# Patient Record
Sex: Female | Born: 1950 | Race: White | Hispanic: No
Health system: Southern US, Community
[De-identification: ages and names within clinical notes are randomized; demographics above are authoritative.]

## PROBLEM LIST (undated history)

## (undated) DIAGNOSIS — I82409 Acute embolism and thrombosis of unspecified deep veins of unspecified lower extremity: Secondary | ICD-10-CM

## (undated) DIAGNOSIS — J45909 Unspecified asthma, uncomplicated: Secondary | ICD-10-CM

## (undated) DIAGNOSIS — G8929 Other chronic pain: Secondary | ICD-10-CM

## (undated) DIAGNOSIS — I10 Essential (primary) hypertension: Secondary | ICD-10-CM

## (undated) DIAGNOSIS — O2230 Deep phlebothrombosis in pregnancy, unspecified trimester: Secondary | ICD-10-CM

## (undated) DIAGNOSIS — S32010A Wedge compression fracture of first lumbar vertebra, initial encounter for closed fracture: Secondary | ICD-10-CM

## (undated) DIAGNOSIS — R159 Full incontinence of feces: Secondary | ICD-10-CM

## (undated) DIAGNOSIS — N390 Urinary tract infection, site not specified: Secondary | ICD-10-CM

## (undated) DIAGNOSIS — K921 Melena: Secondary | ICD-10-CM

## (undated) DIAGNOSIS — J439 Emphysema, unspecified: Secondary | ICD-10-CM

## (undated) DIAGNOSIS — T7840XA Allergy, unspecified, initial encounter: Secondary | ICD-10-CM

## (undated) DIAGNOSIS — K219 Gastro-esophageal reflux disease without esophagitis: Secondary | ICD-10-CM

## (undated) DIAGNOSIS — J449 Chronic obstructive pulmonary disease, unspecified: Secondary | ICD-10-CM

## (undated) DIAGNOSIS — F32A Depression, unspecified: Secondary | ICD-10-CM

## (undated) DIAGNOSIS — N183 Chronic kidney disease, stage 3 unspecified: Secondary | ICD-10-CM

## (undated) DIAGNOSIS — I519 Heart disease, unspecified: Secondary | ICD-10-CM

## (undated) DIAGNOSIS — I219 Acute myocardial infarction, unspecified: Secondary | ICD-10-CM

## (undated) DIAGNOSIS — Z9289 Personal history of other medical treatment: Secondary | ICD-10-CM

## (undated) DIAGNOSIS — E785 Hyperlipidemia, unspecified: Secondary | ICD-10-CM

## (undated) DIAGNOSIS — R32 Unspecified urinary incontinence: Secondary | ICD-10-CM

## (undated) DIAGNOSIS — K635 Polyp of colon: Secondary | ICD-10-CM

## (undated) DIAGNOSIS — B019 Varicella without complication: Secondary | ICD-10-CM

## (undated) DIAGNOSIS — R519 Headache, unspecified: Secondary | ICD-10-CM

## (undated) DIAGNOSIS — F4310 Post-traumatic stress disorder, unspecified: Secondary | ICD-10-CM

## (undated) DIAGNOSIS — Z86718 Personal history of other venous thrombosis and embolism: Secondary | ICD-10-CM

## (undated) DIAGNOSIS — K5792 Diverticulitis of intestine, part unspecified, without perforation or abscess without bleeding: Secondary | ICD-10-CM

## (undated) HISTORY — DX: Diverticulitis of intestine, part unspecified, without perforation or abscess without bleeding: K57.92

## (undated) HISTORY — DX: Varicella without complication: B01.9

## (undated) HISTORY — DX: Depression, unspecified: F32.A

## (undated) HISTORY — DX: Allergy, unspecified, initial encounter: T78.40XA

## (undated) HISTORY — PX: ENDOMETRIAL ABLATION: SHX621

## (undated) HISTORY — DX: Melena: K92.1

## (undated) HISTORY — DX: Gastro-esophageal reflux disease without esophagitis: K21.9

## (undated) HISTORY — DX: Headache, unspecified: R51.9

## (undated) HISTORY — DX: Full incontinence of feces: R15.9

## (undated) HISTORY — DX: Urinary tract infection, site not specified: N39.0

## (undated) HISTORY — PX: CATARACT EXTRACTION: SUR2

## (undated) HISTORY — DX: Acute myocardial infarction, unspecified: I21.9

## (undated) HISTORY — DX: Personal history of other medical treatment: Z92.89

## (undated) HISTORY — DX: Hyperlipidemia, unspecified: E78.5

## (undated) HISTORY — DX: Heart disease, unspecified: I51.9

## (undated) HISTORY — PX: HYSTEROSCOPY WITH D & C: SHX1775

## (undated) HISTORY — DX: Polyp of colon: K63.5

## (undated) HISTORY — DX: Chronic obstructive pulmonary disease, unspecified: J44.9

## (undated) HISTORY — PX: APPENDECTOMY: SHX54

## (undated) HISTORY — PX: VEIN SURGERY: SHX48

## (undated) HISTORY — DX: Post-traumatic stress disorder, unspecified: F43.10

## (undated) HISTORY — DX: Unspecified urinary incontinence: R32

## (undated) HISTORY — DX: Wedge compression fracture of first lumbar vertebra, initial encounter for closed fracture: S32.010A

## (undated) HISTORY — DX: Emphysema, unspecified: J43.9

## (undated) HISTORY — PX: LAPAROSCOPIC LYSIS OF ADHESIONS: SHX5905

## (undated) HISTORY — PX: COLOSTOMY: SHX63

## (undated) HISTORY — DX: Personal history of other venous thrombosis and embolism: Z86.718

## (undated) HISTORY — PX: HERNIA REPAIR: SHX51

## (undated) HISTORY — PX: EYE SURGERY: SHX253

---
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
## 1983-08-27 HISTORY — PX: HERNIA REPAIR: SHX51

## 1993-08-26 HISTORY — PX: ABDOMINAL HYSTERECTOMY: SHX81

## 2001-01-07 ENCOUNTER — Inpatient Hospital Stay (HOSPITAL_COMMUNITY): Admission: EM | Admit: 2001-01-07 | Discharge: 2001-01-10 | Payer: Self-pay | Admitting: *Deleted

## 2001-01-13 ENCOUNTER — Other Ambulatory Visit (HOSPITAL_COMMUNITY): Admission: RE | Admit: 2001-01-13 | Discharge: 2001-02-02 | Payer: Self-pay | Admitting: Psychiatry

## 2012-12-30 DIAGNOSIS — Z86718 Personal history of other venous thrombosis and embolism: Secondary | ICD-10-CM | POA: Insufficient documentation

## 2012-12-30 DIAGNOSIS — I82409 Acute embolism and thrombosis of unspecified deep veins of unspecified lower extremity: Secondary | ICD-10-CM | POA: Insufficient documentation

## 2012-12-30 DIAGNOSIS — J4540 Moderate persistent asthma, uncomplicated: Secondary | ICD-10-CM | POA: Insufficient documentation

## 2013-02-18 DIAGNOSIS — D6869 Other thrombophilia: Secondary | ICD-10-CM | POA: Insufficient documentation

## 2013-02-18 DIAGNOSIS — Z7901 Long term (current) use of anticoagulants: Secondary | ICD-10-CM | POA: Insufficient documentation

## 2013-03-09 DIAGNOSIS — Z86010 Personal history of colonic polyps: Secondary | ICD-10-CM | POA: Insufficient documentation

## 2013-03-09 DIAGNOSIS — D509 Iron deficiency anemia, unspecified: Secondary | ICD-10-CM | POA: Insufficient documentation

## 2013-07-26 DIAGNOSIS — F39 Unspecified mood [affective] disorder: Secondary | ICD-10-CM | POA: Insufficient documentation

## 2013-08-05 DIAGNOSIS — G4733 Obstructive sleep apnea (adult) (pediatric): Secondary | ICD-10-CM | POA: Insufficient documentation

## 2014-01-27 DIAGNOSIS — K439 Ventral hernia without obstruction or gangrene: Secondary | ICD-10-CM | POA: Insufficient documentation

## 2014-01-27 HISTORY — DX: Ventral hernia without obstruction or gangrene: K43.9

## 2014-01-28 DIAGNOSIS — N329 Bladder disorder, unspecified: Secondary | ICD-10-CM | POA: Insufficient documentation

## 2014-08-26 HISTORY — PX: CHOLECYSTECTOMY: SHX55

## 2015-03-13 DIAGNOSIS — R609 Edema, unspecified: Secondary | ICD-10-CM | POA: Insufficient documentation

## 2015-03-13 DIAGNOSIS — G894 Chronic pain syndrome: Secondary | ICD-10-CM | POA: Diagnosis present

## 2016-03-06 DIAGNOSIS — R791 Abnormal coagulation profile: Secondary | ICD-10-CM | POA: Insufficient documentation

## 2016-07-08 DIAGNOSIS — L298 Other pruritus: Secondary | ICD-10-CM | POA: Insufficient documentation

## 2016-12-11 DIAGNOSIS — R262 Difficulty in walking, not elsewhere classified: Secondary | ICD-10-CM | POA: Insufficient documentation

## 2016-12-11 DIAGNOSIS — Z6841 Body Mass Index (BMI) 40.0 and over, adult: Secondary | ICD-10-CM | POA: Insufficient documentation

## 2016-12-11 DIAGNOSIS — R159 Full incontinence of feces: Secondary | ICD-10-CM

## 2016-12-11 DIAGNOSIS — G4734 Idiopathic sleep related nonobstructive alveolar hypoventilation: Secondary | ICD-10-CM | POA: Insufficient documentation

## 2016-12-11 DIAGNOSIS — Z9989 Dependence on other enabling machines and devices: Secondary | ICD-10-CM | POA: Insufficient documentation

## 2016-12-11 HISTORY — DX: Full incontinence of feces: R15.9

## 2017-03-19 DIAGNOSIS — E538 Deficiency of other specified B group vitamins: Secondary | ICD-10-CM | POA: Insufficient documentation

## 2017-03-19 DIAGNOSIS — N183 Chronic kidney disease, stage 3 unspecified: Secondary | ICD-10-CM | POA: Diagnosis present

## 2017-03-19 DIAGNOSIS — E559 Vitamin D deficiency, unspecified: Secondary | ICD-10-CM | POA: Insufficient documentation

## 2017-03-25 DIAGNOSIS — F330 Major depressive disorder, recurrent, mild: Secondary | ICD-10-CM | POA: Insufficient documentation

## 2017-04-11 DIAGNOSIS — R7303 Prediabetes: Secondary | ICD-10-CM | POA: Insufficient documentation

## 2018-01-12 DIAGNOSIS — J189 Pneumonia, unspecified organism: Secondary | ICD-10-CM

## 2018-01-12 DIAGNOSIS — N3000 Acute cystitis without hematuria: Secondary | ICD-10-CM | POA: Insufficient documentation

## 2018-01-12 DIAGNOSIS — N179 Acute kidney failure, unspecified: Secondary | ICD-10-CM | POA: Insufficient documentation

## 2018-01-12 HISTORY — DX: Pneumonia, unspecified organism: J18.9

## 2018-01-22 DIAGNOSIS — R531 Weakness: Secondary | ICD-10-CM | POA: Insufficient documentation

## 2018-01-22 DIAGNOSIS — R5381 Other malaise: Secondary | ICD-10-CM | POA: Insufficient documentation

## 2018-01-24 DIAGNOSIS — J9621 Acute and chronic respiratory failure with hypoxia: Secondary | ICD-10-CM

## 2018-01-24 HISTORY — DX: Acute and chronic respiratory failure with hypoxia: J96.21

## 2018-08-26 DIAGNOSIS — U071 COVID-19: Secondary | ICD-10-CM

## 2018-08-26 HISTORY — DX: COVID-19: U07.1

## 2018-09-29 ENCOUNTER — Observation Stay (HOSPITAL_BASED_OUTPATIENT_CLINIC_OR_DEPARTMENT_OTHER)
Admission: EM | Admit: 2018-09-29 | Discharge: 2018-10-01 | Disposition: A | Discharge status: Home / Self Care | Payer: Medicare Other | Attending: Emergency Medicine | Admitting: Emergency Medicine

## 2018-09-29 ENCOUNTER — Encounter (HOSPITAL_BASED_OUTPATIENT_CLINIC_OR_DEPARTMENT_OTHER): Payer: Self-pay

## 2018-09-29 ENCOUNTER — Emergency Department (HOSPITAL_BASED_OUTPATIENT_CLINIC_OR_DEPARTMENT_OTHER): Payer: Medicare Other

## 2018-09-29 ENCOUNTER — Other Ambulatory Visit: Payer: Self-pay

## 2018-09-29 DIAGNOSIS — M79606 Pain in leg, unspecified: Secondary | ICD-10-CM

## 2018-09-29 DIAGNOSIS — I10 Essential (primary) hypertension: Secondary | ICD-10-CM | POA: Diagnosis not present

## 2018-09-29 DIAGNOSIS — Z91120 Patient's intentional underdosing of medication regimen due to financial hardship: Secondary | ICD-10-CM | POA: Diagnosis not present

## 2018-09-29 DIAGNOSIS — I82591 Chronic embolism and thrombosis of other specified deep vein of right lower extremity: Secondary | ICD-10-CM | POA: Insufficient documentation

## 2018-09-29 DIAGNOSIS — F329 Major depressive disorder, single episode, unspecified: Secondary | ICD-10-CM | POA: Insufficient documentation

## 2018-09-29 DIAGNOSIS — M79604 Pain in right leg: Secondary | ICD-10-CM | POA: Diagnosis present

## 2018-09-29 DIAGNOSIS — J45909 Unspecified asthma, uncomplicated: Secondary | ICD-10-CM | POA: Insufficient documentation

## 2018-09-29 DIAGNOSIS — R197 Diarrhea, unspecified: Secondary | ICD-10-CM | POA: Insufficient documentation

## 2018-09-29 DIAGNOSIS — I82409 Acute embolism and thrombosis of unspecified deep veins of unspecified lower extremity: Secondary | ICD-10-CM

## 2018-09-29 DIAGNOSIS — T45516A Underdosing of anticoagulants, initial encounter: Secondary | ICD-10-CM | POA: Diagnosis not present

## 2018-09-29 DIAGNOSIS — Z86718 Personal history of other venous thrombosis and embolism: Secondary | ICD-10-CM | POA: Diagnosis present

## 2018-09-29 HISTORY — DX: Unspecified asthma, uncomplicated: J45.909

## 2018-09-29 HISTORY — DX: Other chronic pain: G89.29

## 2018-09-29 HISTORY — DX: Essential (primary) hypertension: I10

## 2018-09-29 HISTORY — DX: Deep phlebothrombosis in pregnancy, unspecified trimester: O22.30

## 2018-09-29 LAB — BASIC METABOLIC PANEL
Anion gap: 7 (ref 5–15)
BUN: 34 mg/dL — ABNORMAL HIGH (ref 8–23)
CO2: 24 mmol/L (ref 22–32)
Calcium: 9.7 mg/dL (ref 8.9–10.3)
Chloride: 106 mmol/L (ref 98–111)
Creatinine, Ser: 1.23 mg/dL — ABNORMAL HIGH (ref 0.44–1.00)
GFR calc Af Amer: 53 mL/min — ABNORMAL LOW (ref >60)
GFR calc non Af Amer: 46 mL/min — ABNORMAL LOW (ref >60)
Glucose, Bld: 95 mg/dL (ref 70–99)
Potassium: 4.4 mmol/L (ref 3.5–5.1)
Sodium: 137 mmol/L (ref 135–145)

## 2018-09-29 LAB — CBC
HCT: 45.2 % (ref 36.0–46.0)
Hemoglobin: 14 g/dL (ref 12.0–15.0)
MCH: 26.7 pg (ref 26.0–34.0)
MCHC: 31 g/dL (ref 30.0–36.0)
MCV: 86.3 fL (ref 80.0–100.0)
PLATELETS: 346 10*3/uL (ref 150–400)
RBC: 5.24 MIL/uL — ABNORMAL HIGH (ref 3.87–5.11)
RDW: 16.9 % — ABNORMAL HIGH (ref 11.5–15.5)
WBC: 8.4 10*3/uL (ref 4.0–10.5)
nRBC: 0 % (ref 0.0–0.2)

## 2018-09-29 MED ORDER — HEPARIN (PORCINE) 25000 UT/250ML-% IV SOLN
1300.0000 [IU]/h | INTRAVENOUS | Status: DC
  Administered 2018-09-29 – 2018-09-30 (×2): 1300 [IU]/h via INTRAVENOUS
  Filled 2018-09-29 (×2): qty 250

## 2018-09-29 MED ORDER — HEPARIN BOLUS VIA INFUSION
4000.0000 [IU] | Freq: Once | INTRAVENOUS | Status: AC
  Administered 2018-09-29: 4000 [IU] via INTRAVENOUS

## 2018-09-29 NOTE — ED Notes (Signed)
Patient transported to Ultrasound 

## 2018-09-29 NOTE — Progress Notes (Signed)
ANTICOAGULATION CONSULT NOTE - Initial Consult  Pharmacy Consult for heparin Indication: DVT  Allergies  Allergen Reactions  . Rofecoxib Anaphylaxis  . Tetracyclines & Related Nausea And Vomiting  . Benadryl Allergy [Diphenhydramine Hcl] Other (See Comments)    unknown  . Gatifloxacin     Other reaction(s): Confusion Very low blood pressure Tequin (Gatifloxacin) Very low blood pressure Very low blood pressure     Patient Measurements: Height: 5\' 3"  (160 cm) Weight: 248 lb (112.5 kg) IBW/kg (Calculated) : 52.4 Heparin Dosing Weight: 80kg  Vital Signs: Temp: 98.5 F (36.9 C) (02/04 1938) Temp Source: Oral (02/04 1938) BP: 129/67 (02/04 1938) Pulse Rate: 77 (02/04 1938)  Labs: Recent Labs    09/29/18 1650  HGB 14.0  HCT 45.2  PLT 346  CREATININE 1.23*    Estimated Creatinine Clearance: 54.3 mL/min (A) (by C-G formula based on SCr of 1.23 mg/dL (H)).   Medical History: Past Medical History:  Diagnosis Date  . Asthma   . Chronic pain   . DVT (deep vein thrombosis) in pregnancy   . Hypertension      Assessment: 64 YOF with DVT, no anticoagulation PTA and CBC wnl.   Goal of Therapy:  Heparin level 0.3-0.7 units/ml Monitor platelets by anticoagulation protocol: Yes   Plan:  Heparin 4000 units x 1, and gtt at 1300 units/hr F/u 6 hour heparin level  Daylene Posey, PharmD Clinical Pharmacist Please check AMION for all [facility] numbers 09/29/2018 8:08 PM

## 2018-09-29 NOTE — ED Notes (Signed)
Called to give report, nurse will call back 

## 2018-09-29 NOTE — ED Notes (Signed)
Pt returned from ultrasound

## 2018-09-29 NOTE — ED Triage Notes (Addendum)
Pt c/o pain to inner thigh x 2 days, worse today. Pt states she has hx of blood clots and is concerned because this feels similar. Pt has been off anticoagulants since 7/19.

## 2018-09-29 NOTE — ED Notes (Signed)
[facility] notified (Tara) - patient ready for transport 

## 2018-09-29 NOTE — ED Provider Notes (Signed)
[facility] EMERGENCY DEPARTMENT Provider Note   CSN: [id number] Arrival date & time: 09/29/18  1355     History   Chief Complaint Chief Complaint  Patient presents with  . Leg Pain    HPI Mary Ferguson is a 68 y.o. female.  Patient is [age]-year-old female with past medical history of peripheral neuropathy, hypertension, multiple DVTs who presents emergency department for lower extremity pain.  Patient reports that she was previously taking Coumadin but back in July she stopped taking it.  She reports that she is tired of taking all her medications and she could not afford a lot of them.  She reports that the last couple days she has had more pain in the right upper thigh and just bilateral lower extremity pain in general.  She is concerned that she might have another DVT.  She had stopped seeing any of her doctors back in July as well but reports that last week she did set up and see a new provider.     Past Medical History:  Diagnosis Date  . Asthma   . Chronic pain   . DVT (deep vein thrombosis) in pregnancy   . Hypertension     There are no active problems to display for this patient.   Past Surgical History:  Procedure Laterality Date  . EYE SURGERY    . HERNIA REPAIR    . VEIN SURGERY       OB History   No obstetric history on file.      Home Medications    Prior to Admission medications   Not on File    Family History No family history on file.  Social History Social History   Tobacco Use  . Smoking status: Former Games developer  . Smokeless tobacco: Never Used  Substance Use Topics  . Alcohol use: Never    Frequency: Never  . Drug use: Never     Allergies   Rofecoxib; Tetracyclines & related; Benadryl allergy [diphenhydramine hcl]; and Gatifloxacin   Review of Systems Review of Systems  Constitutional: Negative.   Respiratory: Negative for cough, shortness of breath and wheezing.   Cardiovascular: Positive for leg swelling. Negative  for chest pain and palpitations.  Gastrointestinal: Negative for abdominal pain, diarrhea, nausea and vomiting.  Endocrine: Negative for polyuria.  Genitourinary: Negative for dysuria and hematuria.  Musculoskeletal: Positive for gait problem. Negative for arthralgias, back pain and myalgias.  Skin: Negative for color change, rash and wound.  Neurological: Negative for dizziness and light-headedness.  Hematological: Bruises/bleeds easily.  Psychiatric/Behavioral: Negative for confusion.     Physical Exam Updated Vital Signs BP 129/67 (BP Location: Right Arm)   Pulse 77   Temp 98.5 F (36.9 C) (Oral)   Resp 18   Ht 5\' 3"  (1.6 m)   Wt 112.5 kg   SpO2 96%   BMI 43.93 kg/m   Physical Exam Vitals signs and nursing note reviewed.  Constitutional:      General: She is not in acute distress.    Appearance: Normal appearance. She is obese. She is not ill-appearing, toxic-appearing or diaphoretic.  HENT:     Head: Normocephalic.     Nose: Nose normal.     Mouth/Throat:     Mouth: Mucous membranes are moist.  Eyes:     Conjunctiva/sclera: Conjunctivae normal.  Cardiovascular:     Rate and Rhythm: Normal rate and regular rhythm.  Pulmonary:     Effort: Pulmonary effort is normal.  Musculoskeletal:  Right lower leg: Edema present.     Left lower leg: Edema present.     Comments: Bilateral lower extremity edema 2+, pitting.  No specific calf tenderness.  Skin:    General: Skin is dry.     Comments: Bilateral lower extremities venous varicosities.  Specifically a tender, superficial varicose vein in the medial right upper thigh.  Neurological:     Mental Status: She is alert.  Psychiatric:        Mood and Affect: Mood normal.      ED Treatments / Results  Labs (all labs ordered are listed, but only abnormal results are displayed) Labs Reviewed  CBC - Abnormal; Notable for the following components:      Result Value   RBC 5.24 (*)    RDW 16.9 (*)    All other  components within normal limits  BASIC METABOLIC PANEL - Abnormal; Notable for the following components:   BUN 34 (*)    Creatinine, Ser 1.23 (*)    GFR calc non Af Amer 46 (*)    GFR calc Af Amer 53 (*)    All other components within normal limits    EKG None  Radiology No results found.  Procedures Procedures (including critical care time)  Medications Ordered in ED Medications - No data to display   Initial Impression / Assessment and Plan / ED Course  I have reviewed the triage vital signs and the nursing notes.  Pertinent labs & imaging results that were available during my care of the patient were reviewed by me and considered in my medical decision making (see chart for details).  Clinical Course as of Sep 29 1948  Tue Sep 29, 2018  1949 Patient stable, waiting on imaging results. Patient care passed to [facility] PA due to change of shift   [KM]    Clinical Course User Index [KM] Arlyn Dunning, PA-C    Based on review of vitals, medical screening exam, lab work and/or imaging, there does not appear to be an acute, emergent etiology for the patient's symptoms. Counseled pt on good return precautions and encouraged both PCP and ED follow-up as needed.  Prior to discharge, I also discussed incidental imaging findings with patient in detail and advised appropriate, recommended follow-up in detail.  Clinical Impression: 1. Leg pain   2. History of DVT (deep vein thrombosis)     Disposition: Care passed to [facility] PA due to change of shift  This note was prepared with assistance of Dragon voice recognition software. Occasional wrong-word or sound-a-like substitutions may have occurred due to the inherent limitations of voice recognition software.   Final Clinical Impressions(s) / ED Diagnoses   Final diagnoses:  Leg pain  History of DVT (deep vein thrombosis)    ED Discharge Orders    None       Jeral Pinch 09/29/18 1950     Tegeler, Canary Brim, MD 09/30/18 [phone]

## 2018-09-30 DIAGNOSIS — I82401 Acute embolism and thrombosis of unspecified deep veins of right lower extremity: Secondary | ICD-10-CM

## 2018-09-30 DIAGNOSIS — M79604 Pain in right leg: Secondary | ICD-10-CM

## 2018-09-30 DIAGNOSIS — I82591 Chronic embolism and thrombosis of other specified deep vein of right lower extremity: Secondary | ICD-10-CM | POA: Diagnosis not present

## 2018-09-30 LAB — COMPREHENSIVE METABOLIC PANEL
ALBUMIN: 3.7 g/dL (ref 3.5–5.0)
ALT: 18 U/L (ref 0–44)
AST: 20 U/L (ref 15–41)
Alkaline Phosphatase: 88 U/L (ref 38–126)
Anion gap: 9 (ref 5–15)
BUN: 31 mg/dL — ABNORMAL HIGH (ref 8–23)
CALCIUM: 9.8 mg/dL (ref 8.9–10.3)
CO2: 22 mmol/L (ref 22–32)
Chloride: 109 mmol/L (ref 98–111)
Creatinine, Ser: 1.13 mg/dL — ABNORMAL HIGH (ref 0.44–1.00)
GFR calc Af Amer: 59 mL/min — ABNORMAL LOW (ref >60)
GFR calc non Af Amer: 51 mL/min — ABNORMAL LOW (ref >60)
Glucose, Bld: 123 mg/dL — ABNORMAL HIGH (ref 70–99)
Potassium: 4.1 mmol/L (ref 3.5–5.1)
Sodium: 140 mmol/L (ref 135–145)
Total Bilirubin: 0.5 mg/dL (ref 0.3–1.2)
Total Protein: 7.2 g/dL (ref 6.5–8.1)

## 2018-09-30 LAB — CBC
HCT: 42.9 % (ref 36.0–46.0)
HCT: 43.1 % (ref 36.0–46.0)
Hemoglobin: 13.1 g/dL (ref 12.0–15.0)
Hemoglobin: 13.2 g/dL (ref 12.0–15.0)
MCH: 26.8 pg (ref 26.0–34.0)
MCH: 27.1 pg (ref 26.0–34.0)
MCHC: 30.5 g/dL (ref 30.0–36.0)
MCHC: 30.6 g/dL (ref 30.0–36.0)
MCV: 87.4 fL (ref 80.0–100.0)
MCV: 88.8 fL (ref 80.0–100.0)
Platelets: 339 10*3/uL (ref 150–400)
Platelets: 354 10*3/uL (ref 150–400)
RBC: 4.83 MIL/uL (ref 3.87–5.11)
RBC: 4.93 MIL/uL (ref 3.87–5.11)
RDW: 17 % — ABNORMAL HIGH (ref 11.5–15.5)
RDW: 17.1 % — ABNORMAL HIGH (ref 11.5–15.5)
WBC: 8.8 10*3/uL (ref 4.0–10.5)
WBC: 8.9 10*3/uL (ref 4.0–10.5)
nRBC: 0 % (ref 0.0–0.2)
nRBC: 0 % (ref 0.0–0.2)

## 2018-09-30 LAB — HEPARIN LEVEL (UNFRACTIONATED)
Heparin Unfractionated: 0.51 IU/mL (ref 0.30–0.70)
Heparin Unfractionated: 0.48 IU/mL (ref 0.30–0.70)

## 2018-09-30 LAB — HIV ANTIBODY (ROUTINE TESTING W REFLEX): HIV SCREEN 4TH GENERATION: NONREACTIVE

## 2018-09-30 MED ORDER — SODIUM CHLORIDE 0.9 % IV SOLN: 250.0000 mL | INTRAVENOUS | Status: DC | PRN

## 2018-09-30 MED ORDER — ENOXAPARIN SODIUM 120 MG/0.8ML ~~LOC~~ SOLN
1.0000 mg/kg | Freq: Once | SUBCUTANEOUS | Status: AC
  Administered 2018-09-30: 112 mg via SUBCUTANEOUS
  Filled 2018-09-30: qty 0.75

## 2018-09-30 MED ORDER — ENOXAPARIN SODIUM 120 MG/0.8ML ~~LOC~~ SOLN
1.0000 mg/kg | Freq: Two times a day (BID) | SUBCUTANEOUS | Status: DC
  Filled 2018-09-30: qty 0.75

## 2018-09-30 MED ORDER — DIPHENOXYLATE-ATROPINE 2.5-0.025 MG PO TABS: 1.0000 | ORAL_TABLET | Freq: Four times a day (QID) | ORAL | Status: DC | PRN

## 2018-09-30 MED ORDER — ENOXAPARIN SODIUM 120 MG/0.8ML ~~LOC~~ SOLN
1.0000 mg/kg | Freq: Once | SUBCUTANEOUS | Status: AC
  Administered 2018-10-01: 115 mg via SUBCUTANEOUS
  Filled 2018-09-30: qty 0.75

## 2018-09-30 MED ORDER — DULOXETINE HCL 60 MG PO CPEP
60.0000 mg | ORAL_CAPSULE | Freq: Every day | ORAL | Status: DC
  Administered 2018-09-30 – 2018-10-01 (×2): 60 mg via ORAL
  Filled 2018-09-30 (×2): qty 1

## 2018-09-30 MED ORDER — HYDROXYZINE HCL 25 MG PO TABS
25.0000 mg | ORAL_TABLET | Freq: Once | ORAL | Status: AC
  Administered 2018-09-30: 25 mg via ORAL
  Filled 2018-09-30: qty 1

## 2018-09-30 MED ORDER — ACETAMINOPHEN 325 MG PO TABS: 650.0000 mg | ORAL_TABLET | Freq: Four times a day (QID) | ORAL | Status: DC | PRN

## 2018-09-30 MED ORDER — BUPROPION HCL ER (XL) 300 MG PO TB24
300.0000 mg | ORAL_TABLET | Freq: Every day | ORAL | Status: DC
  Administered 2018-09-30 – 2018-10-01 (×2): 300 mg via ORAL
  Filled 2018-09-30 (×2): qty 1

## 2018-09-30 MED ORDER — SODIUM CHLORIDE 0.9% FLUSH: 3.0000 mL | INTRAVENOUS | Status: DC | PRN

## 2018-09-30 MED ORDER — SODIUM CHLORIDE 0.9% FLUSH
3.0000 mL | Freq: Two times a day (BID) | INTRAVENOUS | Status: DC
  Administered 2018-09-30 (×3): 3 mL via INTRAVENOUS

## 2018-09-30 MED ORDER — ONDANSETRON HCL 4 MG PO TABS
4.0000 mg | ORAL_TABLET | Freq: Four times a day (QID) | ORAL | Status: DC | PRN
  Administered 2018-09-30 – 2018-10-01 (×2): 4 mg via ORAL
  Filled 2018-09-30 (×2): qty 1

## 2018-09-30 MED ORDER — ACETAMINOPHEN 650 MG RE SUPP: 650.0000 mg | Freq: Four times a day (QID) | RECTAL | Status: DC | PRN

## 2018-09-30 MED ORDER — ONDANSETRON HCL 4 MG/2ML IJ SOLN: 4.0000 mg | Freq: Four times a day (QID) | INTRAMUSCULAR | Status: DC | PRN

## 2018-09-30 MED ORDER — HYDROXYZINE HCL 25 MG PO TABS
25.0000 mg | ORAL_TABLET | Freq: Two times a day (BID) | ORAL | Status: DC | PRN
  Administered 2018-09-30 – 2018-10-01 (×3): 25 mg via ORAL
  Filled 2018-09-30 (×3): qty 1

## 2018-09-30 MED ORDER — DICYCLOMINE HCL 20 MG PO TABS
20.0000 mg | ORAL_TABLET | Freq: Four times a day (QID) | ORAL | Status: DC
  Administered 2018-09-30 – 2018-10-01 (×5): 20 mg via ORAL
  Filled 2018-09-30 (×5): qty 1

## 2018-09-30 NOTE — Progress Notes (Signed)
ANTICOAGULATION CONSULT NOTE -  Consult  Pharmacy Consult for heparin Indication: DVT  Allergies  Allergen Reactions  . Rofecoxib Anaphylaxis  . Tetracyclines & Related Nausea And Vomiting  . Benadryl Allergy [Diphenhydramine Hcl] Other (See Comments)    unknown  . Cephalexin Rash  . Gatifloxacin     Other reaction(s): Confusion Very low blood pressure Tequin (Gatifloxacin) Very low blood pressure Very low blood pressure     Patient Measurements: Height: 5\' 3"  (160 cm) Weight: 248 lb (112.5 kg) IBW/kg (Calculated) : 52.4 Heparin Dosing Weight: 80kg  Vital Signs: Temp: 98.2 F (36.8 C) (02/04 2346) Temp Source: Oral (02/04 2346) BP: 137/92 (02/04 2346) Pulse Rate: 87 (02/04 2346)  Labs: Recent Labs    09/29/18 1650 09/30/18 0321  HGB 14.0 13.1  13.2  HCT 45.2 42.9  43.1  PLT 346 339  354  HEPARINUNFRC  --  0.51  CREATININE 1.23* 1.13*    Estimated Creatinine Clearance: 59.1 mL/min (A) (by C-G formula based on SCr of 1.13 mg/dL (H)).   Medical History: Past Medical History:  Diagnosis Date  . Asthma   . Chronic pain   . DVT (deep vein thrombosis) in pregnancy   . Hypertension      Assessment: 83 YOF with DVT, no anticoagulation PTA and CBC wnl.   Today, 2/5 0321 HL = 0.51 at goal, no bleeding per RN, infusion paused briefly ~ 10 mins ~ 2300.   Goal of Therapy:  Heparin level 0.3-0.7 units/ml Monitor platelets by anticoagulation protocol: Yes   Plan:  Continue heparin drip at 1300 units/hr Recheck confirmatory HL Daily CBC/HL  Lorenza Evangelist  09/30/2018 5:14 AM

## 2018-09-30 NOTE — ED Provider Notes (Signed)
Pt signed out to me by Vena Rua, PA-C. Please see previous notes for further history.   In brief, pt presenting for evaluation of leg pain and swelling. H/o DVT, stopped coumadin several months ago. Pt was on coumadin due to financial constraints. Pt reports intermittent CP/SOB over the past several weeks, but none in the past week. [place] pending. If positive, pt may need admission. If negative, plan for CTA to r/o PE prior to d/c.   DVT study positive for DVT in profundal femoral view and proximal greater saphenous vein. Considering pt's history of needing coumadin, proximal large DVT, and poor OP follow up, plan for admission.   Discussed with Dr. Sharl Ma from [facility], pt to be admitted to [facility].     Alveria Apley, PA-C 09/30/18 0134    Tegeler, Canary Brim, MD 09/30/18 1247

## 2018-09-30 NOTE — Progress Notes (Signed)
Nutrition Brief Note  Patient identified on the Malnutrition Screening Tool (MST) Report  Patient has been trying to lose weight PTA. Pt is currently eating 100% of meals at this time.  Wt Readings from Last 15 Encounters:  09/29/18 112.5 kg    Body mass index is 43.93 kg/m. Patient meets criteria for morbid obesity based on current BMI.   Current diet order is regular, patient is consuming approximately 100% of meals at this time. Labs and medications reviewed.   No nutrition interventions warranted at this time. If nutrition issues arise, please consult RD.   Tilda Franco, MS, RD, LDN [facility] Inpatient Clinical Dietitian Pager: [phone] After Hours Pager: [phone]

## 2018-09-30 NOTE — H&P (Signed)
TRH H&P    Patient Demographics:    Mary Ferguson, is a 68 y.o. female  MRN: [id number]  DOB - 04-12-1951  Admit Date - 09/29/2018  Referring MD/NP/PA: Cristal Deerhristopher Tegeler  Outpatient Primary MD for the patient is Levonne Lappingann, Samandra, NP  [facility]  Chief complaint-right leg swelling and pain   HPI:    Mary Ferguson  is a 68 y.o. female, with history of asthma, prior DVTs x5 who came with right lower extremity pain and swelling.  Patient says that she has been off her Coumadin since July since she ran out of this medication and was unable to get out of the house due to persistent pain.  Patient has chronic pain syndrome.  Today patient noted that her right leg was more swollen than usual and she got concerned for DVT.  Patient came to ED, lower extremity venous duplex showed positive for right lower extremity DVT in the profunda femoral vein and proximal greater saphenous vein. Patient was started on IV heparin. Today she denies chest pain or shortness of breath. Has history of intermittent nausea and vomiting, GERD Has chronic abdominal pain from prior surgeries. Chronic back pain Denies dysuria but patient has urinary incontinence, which is chronic. No history of CAD No previous history of stroke or seizures.    Review of systems:    In addition to the HPI above,   All other systems reviewed and are negative.    Past History of the following :    Past Medical History:  Diagnosis Date  . Asthma   . Chronic pain   . DVT (deep vein thrombosis) in pregnancy   . Hypertension       Past Surgical History:  Procedure Laterality Date  . EYE SURGERY    . HERNIA REPAIR    . VEIN SURGERY        Social History:      Social History   Tobacco Use  . Smoking status: Former Games developermoker  . Smokeless tobacco: Never Used  Substance Use Topics  . Alcohol use: Never     Frequency: Never       Family History :   No history of CAD   Home Medications:   Prior to Admission medications   Medication Sig Start Date End Date Taking? Authorizing Provider  buPROPion (WELLBUTRIN XL) 300 MG 24 hr tablet Take 300 mg by mouth daily. 09/22/18  Yes [provider]  clobetasol ointment (TEMOVATE) 0.05 % Apply 1 application topically 2 (two) times daily. 09/22/18  Yes [provider]  dicyclomine (BENTYL) 20 MG tablet Take 20 mg by mouth 4 (four) times daily. 06/15/15  Yes [provider]  diphenoxylate-atropine (LOMOTIL) 2.5-0.025 MG tablet Take 1 tablet by mouth 4 (four) times daily as needed for diarrhea or loose stools.  11/21/17  Yes [provider]  DULoxetine (CYMBALTA) 60 MG capsule Take 60 mg by mouth daily. 09/22/18  Yes [provider]  hydrOXYzine (ATARAX/VISTARIL) 25 MG tablet Take 25 mg by mouth 2 (two)  times daily as needed for itching.  09/22/18  Yes [provider]  metroNIDAZOLE (METROCREAM) 0.75 % cream Apply 1 application topically 2 (two) times daily. 11/19/17  Yes [provider]  promethazine (PHENERGAN) 25 MG tablet Take 25 mg by mouth daily as needed for nausea.  03/10/15  Yes [provider]     Allergies:     Allergies  Allergen Reactions  . Rofecoxib Anaphylaxis  . Tetracyclines & Related Nausea And Vomiting  . Benadryl Allergy [Diphenhydramine Hcl] Other (See Comments)    unknown  . Cephalexin Rash  . Gatifloxacin     Other reaction(s): Confusion Very low blood pressure Tequin (Gatifloxacin) Very low blood pressure Very low blood pressure      Physical Exam:   Vitals  Blood pressure (!) 137/92, pulse 87, temperature 98.2 F (36.8 C), temperature source Oral, resp. rate 17, height 5\' 3"  (1.6 m), weight 112.5 kg, SpO2 95 %.  1.  General: Appears in no acute distress  2. Psychiatric: Alert, oriented x3 intact judgment and insight.  3. Neurologic: Cranial  nerves II 12 grossly intact, motor strength is 5/5 in all extremities.  4. HEENMT:  Atraumatic normocephalic, extraocular muscles intact, oral mucosa is pink and moist.  5. Respiratory : Clear to auscultation bilaterally, no wheezing or crackles.  6. Cardiovascular : S1-S2, regular, no murmur auscultated.  7. Gastrointestinal:  Abdomen is soft, nontender to palpation.  8.Extremities-erythema noted at right thigh, tender to palpation, both lower extremities are edematous.   Data Review:    CBC Recent Labs  Lab 09/29/18 1650  WBC 8.4  HGB 14.0  HCT 45.2  PLT 346  MCV 86.3  MCH 26.7  MCHC 31.0  RDW 16.9*   ------------------------------------------------------------------------------------------------------------------  Results for orders placed or performed during the hospital encounter of 09/29/18 (from the past 48 hour(s))  CBC     Status: Abnormal   Collection Time: 09/29/18  4:50 PM  Result Value Ref Range   WBC 8.4 4.0 - 10.5 K/uL   RBC 5.24 (H) 3.87 - 5.11 MIL/uL   Hemoglobin 14.0 12.0 - 15.0 g/dL   HCT 10.2 72.5 - 36.6 %   MCV 86.3 80.0 - 100.0 fL   MCH 26.7 26.0 - 34.0 pg   MCHC 31.0 30.0 - 36.0 g/dL   RDW 44.0 (H) 34.7 - 42.5 %   Platelets 346 150 - 400 K/uL   nRBC 0.0 0.0 - 0.2 %    Comment: Performed at [facility], [address] [place], [place] [address]  Basic metabolic panel     Status: Abnormal   Collection Time: 09/29/18  4:50 PM  Result Value Ref Range   Sodium 137 135 - 145 mmol/L   Potassium 4.4 3.5 - 5.1 mmol/L   Chloride 106 98 - 111 mmol/L   CO2 24 22 - 32 mmol/L   Glucose, Bld 95 70 - 99 mg/dL   BUN 34 (H) 8 - 23 mg/dL   Creatinine, Ser 7.56 (H) 0.44 - 1.00 mg/dL   Calcium 9.7 8.9 - 43.3 mg/dL   GFR calc non Af Amer 46 (L) >60 mL/min   GFR calc Af Amer 53 (L) >60 mL/min   Anion gap 7 5 - 15    Comment: Performed at [facility], [address] [place], [place] [address]    Chemistries  Recent Labs  Lab  09/29/18 1650  NA 137  K 4.4  CL 106  CO2 24  GLUCOSE 95  BUN 34*  CREATININE 1.23*  CALCIUM 9.7   ------------------------------------------------------------------------------------------------------------------   --------------------------------------------------------------------------------------------------------------- Urine analysis: No results found for: COLORURINE, APPEARANCEUR, LABSPEC, PHURINE, GLUCOSEU, HGBUR, BILIRUBINUR, KETONESUR, PROTEINUR, UROBILINOGEN, NITRITE, LEUKOCYTESUR    Imaging Results:    US Venous Img Lower Bilateral  Addendum Date: 09/29/2018   ADDENDUM REPORT: 09/29/2018 20:03 ADDENDUM: Study discussed by telephone with Dr. Scotty Court on 09/29/2018 at 1955 hours. Electronically Signed   By: Odessa Fleming M.D.   On: 09/29/2018 20:03   Result Date: 09/29/2018 CLINICAL DATA:  68 year old female with leg pain. Right thigh pain swelling and redness for 2-3 days. History of bilateral DVT 1 year ago. Off blood thinner since July EXAM: BILATERAL LOWER EXTREMITY VENOUS DOPPLER ULTRASOUND TECHNIQUE: Gray-scale sonography with graded compression, as well as color Doppler and duplex ultrasound were performed to evaluate the lower extremity deep venous systems from the level of the common femoral vein and including the common femoral, femoral, profunda femoral, popliteal and calf veins including the posterior tibial, peroneal and gastrocnemius veins when visible. The superficial great saphenous vein was also interrogated. Spectral Doppler was utilized to evaluate flow at rest and with distal augmentation maneuvers in the common femoral, femoral and popliteal veins. COMPARISON:  Report of [facility] lower extremity Doppler 05/23/2009 (no images available). FINDINGS: RIGHT LOWER EXTREMITY Common Femoral Vein: No evidence of thrombus. Normal compressibility, respiratory phasicity and response to augmentation. Saphenofemoral Junction: Just beyond the junction the right  greater saphenous vein is incompressible with echogenic clot (image 5). Profunda Femoral Vein: Incompletely compressible with echogenic clot (image 8). Femoral Vein: No evidence of thrombus. Normal compressibility, respiratory phasicity and response to augmentation. Popliteal Vein: No evidence of thrombus. Normal compressibility, respiratory phasicity and response to augmentation. Calf Veins: Suboptimally visualized. No evidence of thrombus. Normal compressibility and flow on color Doppler imaging. Superficial Great Saphenous Vein: The distal right greater saphenous vein appears compressible and patent up to the level of the upper thigh. Venous Reflux:  None. Other Findings:  None. LEFT LOWER EXTREMITY Common Femoral Vein: No evidence of thrombus. Normal compressibility, respiratory phasicity and response to augmentation. Saphenofemoral Junction: No evidence of thrombus. Normal compressibility and flow on color Doppler imaging. Profunda Femoral Vein: No evidence of thrombus. Normal compressibility and flow on color Doppler imaging. Femoral Vein: No evidence of thrombus. Normal compressibility, respiratory phasicity and response to augmentation. Popliteal Vein: No evidence of thrombus. Normal compressibility, respiratory phasicity and response to augmentation. Calf Veins: Suboptimally visualized. No evidence of thrombus. Normal compressibility and flow on color Doppler imaging. Superficial Great Saphenous Vein: No evidence of thrombus. Normal compressibility. Venous Reflux:  None. Other Findings:  None. IMPRESSION: 1. Positive for right lower extremity DVT in the profundal femoral vein and proximal greater saphenous vein. 2. Left lower extremity is negative for DVT. Electronically Signed: By: Odessa Fleming M.D. On: 09/29/2018 19:50      Assessment & Plan:    Active Problems:   DVT (deep venous thrombosis) (HCC)   1. 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  Started on IV heparin, patient is having issues  with getting medications at home.  She will need assistance from case management, home health RN to help with medications at home.  She might be a candidate for Xarelto if she can get financial assistance.  Will consult case management in a.m.  2. Depression-continue Wellbutrin XL, Cymbalta  3. Chronic abdominal pain-continue Bentyl 20 mg 4 times a day  4. Chronic diarrhea-continue Lomotil as needed  DVT Prophylaxis-Heparin  AM Labs Ordered, also please review Full Orders  Family Communication: Admission, patients condition and plan of care including tests being ordered have been discussed with the patient and her son and daughter-in-law at bedside* who indicate understanding and agree with the plan and Code Status.  Code Status: Full code  Admission status: Observation: Based on patients clinical presentation and evaluation of above clinical data, I have made determination that patient would need less than 2 midnight stay in the hospital.  Time spent in minutes : 60 minutes   Meredeth Ide M.D on 09/30/2018 at 1:25 AM

## 2018-09-30 NOTE — Progress Notes (Signed)
Patient admitted by Dr Sharl MaLama  This morning she has been seen and examined by me.  She is sitting up in the chair.  She was diagnosed with DVT therefore started on heparin drip.  She does not have any new complaints besides right lower extremity pain at this time.  She admits that she has not been taking her Coumadin for past 6-7 months as she has not really left her house.  Vital signs are stable at this time On physical exam she has diminished breath sounds at the bases, right lower extremity swelling noted-very slight.  Extremity Doppler axis positive for right lower extremity DVT  Assessment and plan 1.  right lower extremity DVT, medical noncompliance, recurrent 2.  Depression 3.  Chronic abdominal pain  -We will stop IV heparin drip and transition to subcutaneous Lovenox.  Case management has been consulted to assist with patient's medication.  Would prefer patient on Eliquis or Xarelto depending on the cost.  Case management can assist with this.  Extensively educated her on importance of being compliant with her anticoagulation.  Rest of the care as initially planned  Call with any questions as needed  Stephania FragminAnkit Taiz Bickle MD [facility]

## 2018-09-30 NOTE — Progress Notes (Addendum)
ANTICOAGULATION CONSULT NOTE -  Consult  Pharmacy Consult for heparin Indication: DVT  Allergies  Allergen Reactions  . Rofecoxib Anaphylaxis  . Tetracyclines & Related Nausea And Vomiting  . Benadryl Allergy [Diphenhydramine Hcl] Other (See Comments)    unknown  . Cephalexin Rash  . Gatifloxacin     Other reaction(s): Confusion Very low blood pressure Tequin (Gatifloxacin) Very low blood pressure Very low blood pressure     Patient Measurements: Height: 5\' 3"  (160 cm) Weight: 248 lb (112.5 kg) IBW/kg (Calculated) : 52.4 Heparin Dosing Weight: 80kg  Vital Signs: Temp: 98.1 F (36.7 C) (02/05 0959) Temp Source: Oral (02/05 0959) BP: 117/78 (02/05 0959) Pulse Rate: 80 (02/05 0959)  Labs: Recent Labs    09/29/18 1650 09/30/18 0321 09/30/18 1032  HGB 14.0 13.1  13.2  --   HCT 45.2 42.9  43.1  --   PLT 346 339  354  --   HEPARINUNFRC  --  0.51 0.48  CREATININE 1.23* 1.13*  --     Estimated Creatinine Clearance: 59.1 mL/min (A) (by C-G formula based on SCr of 1.13 mg/dL (H)).   Medical History: Past Medical History:  Diagnosis Date  . Asthma   . Chronic pain   . DVT (deep vein thrombosis) in pregnancy   . Hypertension      Assessment:  68 y.o. female, with history of asthma, prior DVTs x5 who came with right lower extremity pain and swelling.  Patient says that she has been off her Coumadin since July .  Pharmacy consulted to dose heparin for DVT. CBC wnl.    09/30/2018 0321 HL = 0.51 at goal.  1032 HL= 0.48 at goal, no issues per RN  Goal of Therapy:  Heparin level 0.3-0.7 units/ml Monitor platelets by anticoagulation protocol: Yes   Plan:  Continue heparin drip at 1300 units/hr Daily CBC/HL  Arley Phenix RPh 09/30/2018, 11:14 AM Pager [phone]    Pharmacy now consulted to dose enoxaparin for DVT  Plan:  Stop heparin drip and labs Start enoxaparin 1mg /kg (115mg ) SQ q12h Follow renal function  Arley Phenix RPh 09/30/2018, 12:53  PM Pager [phone]

## 2018-09-30 NOTE — Care Management Note (Signed)
Case Management Note  Patient Details  Name: Mary Ferguson MRN: [id number] Date of Birth: 1950-10-05  Subjective/Objective:                    Action/Plan:   Expected Discharge Date:                  Expected Discharge Plan:     In-House Referral:     Discharge planning Services     Post Acute Care Choice:    Choice offered to:     DME Arranged:    DME Agency:     HH Arranged:    HH Agency:     Status of Service:     If discussed at [employer], dates discussed:    Additional Comments: Per [facility] [phone] Patient has not met Rx deductable of $383.00 Eliquis not on formulary and Xarelto is covered cost is $423.73 if deductable was met cost would be $40.00  Kerin Salen 09/30/2018, 2:50 PM

## 2018-10-01 DIAGNOSIS — I82401 Acute embolism and thrombosis of unspecified deep veins of right lower extremity: Secondary | ICD-10-CM | POA: Diagnosis not present

## 2018-10-01 DIAGNOSIS — I82591 Chronic embolism and thrombosis of other specified deep vein of right lower extremity: Secondary | ICD-10-CM | POA: Diagnosis not present

## 2018-10-01 LAB — CBC
HCT: 40.3 % (ref 36.0–46.0)
Hemoglobin: 12.3 g/dL (ref 12.0–15.0)
MCH: 27.1 pg (ref 26.0–34.0)
MCHC: 30.5 g/dL (ref 30.0–36.0)
MCV: 88.8 fL (ref 80.0–100.0)
Platelets: 282 10*3/uL (ref 150–400)
RBC: 4.54 MIL/uL (ref 3.87–5.11)
RDW: 16.9 % — ABNORMAL HIGH (ref 11.5–15.5)
WBC: 7.3 10*3/uL (ref 4.0–10.5)
nRBC: 0 % (ref 0.0–0.2)

## 2018-10-01 MED ORDER — RIVAROXABAN 15 MG PO TABS
15.0000 mg | ORAL_TABLET | Freq: Two times a day (BID) | ORAL | Status: DC
  Administered 2018-10-01: 15 mg via ORAL
  Filled 2018-10-01 (×2): qty 1

## 2018-10-01 MED ORDER — RIVAROXABAN (XARELTO) VTE STARTER PACK (15 & 20 MG): ORAL_TABLET | ORAL | 0 refills | Status: DC

## 2018-10-01 NOTE — Progress Notes (Signed)
Patient discharged to home with family, discharge instructions reviewed with patient who verbalized understanding. New RX given to patient. 

## 2018-10-01 NOTE — Discharge Instructions (Signed)

## 2018-10-01 NOTE — Discharge Summary (Signed)
Physician Discharge Summary  Mary Ferguson ZDG:[id number] DOB: Apr 27, 1951 DOA: 09/29/2018  PCP: Levonne Lapping, NP  Admit date: 09/29/2018 Discharge date: 10/01/2018  Admitted From: Home Disposition: Home  Recommendations for Outpatient Follow-up:  1. Follow up with PCP in 1-2 weeks 2. Please obtain BMP/CBC in one week your next doctors visit.  3. Patient to be started on Xarelto; first month starter pack prescription given.  She is also been notified when she meets her deductible of about $400, thereafter her co-pay for the medication will be $40.   Discharge Condition: Stable CODE STATUS: Full Diet recommendation: Heart healthy  Brief/Interim Summary: 68 year old with history of asthma, recurrent DVTs, medical noncompliance came to the hospital complains of right lower extremity pain and swelling.  Patient was previously diagnosed of DVT and was placed on Coumadin.  Because she had difficulty having her INR checked she was not taking her Coumadin for past several months.  In the ER here lower extremity Dopplers was positive for right lower extremity DVT.  She was started on IV heparin and then switched to Lovenox subcutaneously.  After discussion with the case management, patient was placed on Xarelto.  Patient has been informed that when she meets her deductible for around $400, her co-pay would be $40 for Xarelto.  First month free starter pack prescription given. Advised about the importance of compliance. She needs to follow-up outpatient with her primary care provider within next 2-3 weeks.   Discharge Diagnoses:  Active Problems:   DVT (deep venous thrombosis) (HCC)  Recurrent right lower extremity DVT, medical noncompliance -Patient has been noncompliant with oral Coumadin as she was unable to get her INRs checked.  She has not taken this for 6 months -Lower extremity Dopplers is positive for DVT, after an extensive discussion with the patient and the case management it was decided to  place patient on Xarelto.  History of depression -Resume home meds  Chronic abdominal pain -Resume home meds  Chronic diarrhea -Resume home meds  Patient to be discharged on Xarelto She is full code  Discharge Instructions   Allergies as of 10/01/2018      Reactions   Rofecoxib Anaphylaxis   Tetracyclines & Related Nausea And Vomiting   Benadryl Allergy [diphenhydramine Hcl] Other (See Comments)   unknown   Cephalexin Rash   Gatifloxacin    Other reaction(s): Confusion Very low blood pressure Tequin (Gatifloxacin) Very low blood pressure Very low blood pressure      Medication List    TAKE these medications   buPROPion 300 MG 24 hr tablet Commonly known as:  WELLBUTRIN XL Take 300 mg by mouth daily.   clobetasol ointment 0.05 % Commonly known as:  TEMOVATE Apply 1 application topically 2 (two) times daily.   dicyclomine 20 MG tablet Commonly known as:  BENTYL Take 20 mg by mouth 4 (four) times daily.   diphenoxylate-atropine 2.5-0.025 MG tablet Commonly known as:  LOMOTIL Take 1 tablet by mouth 4 (four) times daily as needed for diarrhea or loose stools.   DULoxetine 60 MG capsule Commonly known as:  CYMBALTA Take 60 mg by mouth daily.   hydrOXYzine 25 MG tablet Commonly known as:  ATARAX/VISTARIL Take 25 mg by mouth 2 (two) times daily as needed for itching.   metroNIDAZOLE 0.75 % cream Commonly known as:  METROCREAM Apply 1 application topically 2 (two) times daily.   promethazine 25 MG tablet Commonly known as:  PHENERGAN Take 25 mg by mouth daily as needed for nausea.  Rivaroxaban 15 & 20 MG Tbpk Take as directed on package: Start with one 15mg  tablet by mouth twice a day with food. On Day 22, switch to one 20mg  tablet once a day with food.      Follow-up Information    Levonne Lappingann, Samandra, NP. Schedule an appointment as soon as possible for a visit in 2 week(s).   Specialty:  Nurse Practitioner Contact information: [address] [place] [address] [phone]          Allergies  Allergen Reactions  . Rofecoxib Anaphylaxis  . Tetracyclines & Related Nausea And Vomiting  . Benadryl Allergy [Diphenhydramine Hcl] Other (See Comments)    unknown  . Cephalexin Rash  . Gatifloxacin     Other reaction(s): Confusion Very low blood pressure Tequin (Gatifloxacin) Very low blood pressure Very low blood pressure     You were cared for by a hospitalist during your hospital stay. If you have any questions about your discharge medications or the care you received while you were in the hospital after you are discharged, you can call the unit and asked to speak with the hospitalist on call if the hospitalist that took care of you is not available. Once you are discharged, your primary care physician will handle any further medical issues. Please note that no refills for any discharge medications will be authorized once you are discharged, as it is imperative that you return to your primary care physician (or establish a relationship with a primary care physician if you do not have one) for your aftercare needs so that they can reassess your need for medications and monitor your lab values.  Consultations:  None   Procedures/Studies: [place] Venous Img Lower Bilateral  Addendum Date: 09/29/2018   ADDENDUM REPORT: 09/29/2018 20:03 ADDENDUM: Study discussed by telephone with Dr. Scotty Courthris Tegler on 09/29/2018 at 1955 hours. Electronically Signed   By: Odessa FlemingH  Hall M.D.   On: 09/29/2018 20:03   Result Date: 09/29/2018 CLINICAL DATA:  68 year old female with leg pain. Right thigh pain swelling and redness for 2-3 days. History of bilateral DVT 1 year ago. Off blood thinner since July EXAM: BILATERAL LOWER EXTREMITY VENOUS DOPPLER ULTRASOUND TECHNIQUE: Gray-scale sonography with graded compression, as well as color Doppler and duplex ultrasound were performed to evaluate the lower extremity deep venous systems from the level of the common  femoral vein and including the common femoral, femoral, profunda femoral, popliteal and calf veins including the posterior tibial, peroneal and gastrocnemius veins when visible. The superficial great saphenous vein was also interrogated. Spectral Doppler was utilized to evaluate flow at rest and with distal augmentation maneuvers in the common femoral, femoral and popliteal veins. COMPARISON:  [facility] lower extremity Doppler 05/23/2009 (no images available). FINDINGS: RIGHT LOWER EXTREMITY Common Femoral Vein: No evidence of thrombus. Normal compressibility, respiratory phasicity and response to augmentation. Saphenofemoral Junction: Just beyond the junction the right greater saphenous vein is incompressible with echogenic clot (image 5). Profunda Femoral Vein: Incompletely compressible with echogenic clot (image 8). Femoral Vein: No evidence of thrombus. Normal compressibility, respiratory phasicity and response to augmentation. Popliteal Vein: No evidence of thrombus. Normal compressibility, respiratory phasicity and response to augmentation. Calf Veins: Suboptimally visualized. No evidence of thrombus. Normal compressibility and flow on color Doppler imaging. Superficial Great Saphenous Vein: The distal right greater saphenous vein appears compressible and patent up to the level of the upper thigh. Venous Reflux:  None. Other Findings:  None. LEFT LOWER  EXTREMITY Common Femoral Vein: No evidence of thrombus. Normal compressibility, respiratory phasicity and response to augmentation. Saphenofemoral Junction: No evidence of thrombus. Normal compressibility and flow on color Doppler imaging. Profunda Femoral Vein: No evidence of thrombus. Normal compressibility and flow on color Doppler imaging. Femoral Vein: No evidence of thrombus. Normal compressibility, respiratory phasicity and response to augmentation. Popliteal Vein: No evidence of thrombus. Normal compressibility, respiratory  phasicity and response to augmentation. Calf Veins: Suboptimally visualized. No evidence of thrombus. Normal compressibility and flow on color Doppler imaging. Superficial Great Saphenous Vein: No evidence of thrombus. Normal compressibility. Venous Reflux:  None. Other Findings:  None. IMPRESSION: 1. Positive for right lower extremity DVT in the profundal femoral vein and proximal greater saphenous vein. 2. Left lower extremity is negative for DVT. Electronically Signed: By: Odessa FlemingH  Hall M.D. On: 09/29/2018 19:50      Subjective: Patient denies any complaints.  She feels better.  I had an extensive discussion with the patient about educating her on Xarelto and the importance of medication compliance.  She is also been told about her co-pay and deductibles.  She understands this.  Discharge Exam: Vitals:   09/30/18 2040 10/01/18 0611  BP: 130/65 130/81  Pulse: 93 62  Resp: 18 18  Temp: 98.2 F (36.8 C) 98 F (36.7 C)  SpO2: 96% 94%   Vitals:   09/30/18 0959 09/30/18 1409 09/30/18 2040 10/01/18 0611  BP: 117/78 130/69 130/65 130/81  Pulse: 80 88 93 62  Resp: 16 18 18 18   Temp: 98.1 F (36.7 C)  98.2 F (36.8 C) 98 F (36.7 C)  TempSrc: Oral  Oral Oral  SpO2: 100% 94% 96% 94%  Weight:      Height:        General: Pt is alert, awake, not in acute distress Cardiovascular: RRR, S1/S2 +, no rubs, no gallops Respiratory: CTA bilaterally, no wheezing, no rhonchi Abdominal: Soft, NT, ND, bowel sounds + Extremities: no edema, no cyanosis    The results of significant diagnostics from this hospitalization (including imaging, microbiology, ancillary and laboratory) are listed below for reference.     Microbiology: No results found for this or any previous visit (from the past 240 hour(s)).   Labs: BNP (last 3 results) No results for input(s): BNP in the last 8760 hours. Basic Metabolic Panel: Recent Labs  Lab 09/29/18 1650 09/30/18 0321  NA 137 140  K 4.4 4.1  CL 106 109   CO2 24 22  GLUCOSE 95 123*  BUN 34* 31*  CREATININE 1.23* 1.13*  CALCIUM 9.7 9.8   Liver Function Tests: Recent Labs  Lab 09/30/18 0321  AST 20  ALT 18  ALKPHOS 88  BILITOT 0.5  PROT 7.2  ALBUMIN 3.7   No results for input(s): LIPASE, AMYLASE in the last 168 hours. No results for input(s): AMMONIA in the last 168 hours. CBC: Recent Labs  Lab 09/29/18 1650 09/30/18 0321 10/01/18 0344  WBC 8.4 8.8  8.9 7.3  HGB 14.0 13.1  13.2 12.3  HCT 45.2 42.9  43.1 40.3  MCV 86.3 88.8  87.4 88.8  PLT 346 339  354 282   Cardiac Enzymes: No results for input(s): CKTOTAL, CKMB, CKMBINDEX, TROPONINI in the last 168 hours. BNP: Invalid input(s): POCBNP CBG: No results for input(s): GLUCAP in the last 168 hours. D-Dimer No results for input(s): DDIMER in the last 72 hours. Hgb A1c No results for input(s): HGBA1C in the last 72 hours. Lipid Profile No results for input(s): CHOL,  HDL, LDLCALC, TRIG, CHOLHDL, LDLDIRECT in the last 72 hours. Thyroid function studies No results for input(s): TSH, T4TOTAL, T3FREE, THYROIDAB in the last 72 hours.  Invalid input(s): FREET3 Anemia work up No results for input(s): VITAMINB12, FOLATE, FERRITIN, TIBC, IRON, RETICCTPCT in the last 72 hours. Urinalysis No results found for: COLORURINE, APPEARANCEUR, LABSPEC, PHURINE, GLUCOSEU, HGBUR, BILIRUBINUR, KETONESUR, PROTEINUR, UROBILINOGEN, NITRITE, LEUKOCYTESUR Sepsis Labs Invalid input(s): PROCALCITONIN,  WBC,  LACTICIDVEN Microbiology No results found for this or any previous visit (from the past 240 hour(s)).   Time coordinating discharge:  I have spent 35 minutes face to face with the patient and on the ward discussing the patients care, assessment, plan and disposition with other care givers. >50% of the time was devoted counseling the patient about the risks and benefits of treatment/Discharge disposition and coordinating care.   SIGNED:   Dimple Nanas, MD  [facility] 10/01/2018, 1:42 PM   If 7PM-7AM, please contact night-coverage [email or web address]

## 2018-10-27 ENCOUNTER — Other Ambulatory Visit: Payer: Self-pay | Admitting: Nurse Practitioner

## 2018-10-27 DIAGNOSIS — Z13820 Encounter for screening for osteoporosis: Secondary | ICD-10-CM

## 2018-10-27 DIAGNOSIS — Z1231 Encounter for screening mammogram for malignant neoplasm of breast: Secondary | ICD-10-CM

## 2018-12-08 ENCOUNTER — Other Ambulatory Visit: Payer: Medicare Other

## 2018-12-08 ENCOUNTER — Ambulatory Visit: Payer: Medicare Other

## 2019-02-23 ENCOUNTER — Ambulatory Visit: Payer: Medicare Other

## 2019-02-23 ENCOUNTER — Other Ambulatory Visit: Payer: Medicare Other

## 2019-06-17 ENCOUNTER — Encounter (HOSPITAL_COMMUNITY): Payer: Self-pay | Admitting: Emergency Medicine

## 2019-06-17 ENCOUNTER — Ambulatory Visit (HOSPITAL_COMMUNITY): Admit: 2019-06-17 | Payer: Medicare Other | Admitting: Cardiovascular Disease

## 2019-06-17 ENCOUNTER — Encounter (HOSPITAL_COMMUNITY): Payer: Self-pay

## 2019-06-17 ENCOUNTER — Emergency Department (HOSPITAL_COMMUNITY): Payer: Medicare Other

## 2019-06-17 ENCOUNTER — Inpatient Hospital Stay (HOSPITAL_COMMUNITY)
Admission: EM | Admit: 2019-06-17 | Discharge: 2019-06-22 | DRG: 247 | Disposition: A | Discharge status: Home / Self Care | Payer: Medicare Other | Attending: Cardiovascular Disease | Admitting: Cardiovascular Disease

## 2019-06-17 ENCOUNTER — Other Ambulatory Visit: Payer: Self-pay

## 2019-06-17 DIAGNOSIS — Z955 Presence of coronary angioplasty implant and graft: Secondary | ICD-10-CM

## 2019-06-17 DIAGNOSIS — I2510 Atherosclerotic heart disease of native coronary artery without angina pectoris: Secondary | ICD-10-CM | POA: Diagnosis present

## 2019-06-17 DIAGNOSIS — Z20828 Contact with and (suspected) exposure to other viral communicable diseases: Secondary | ICD-10-CM | POA: Diagnosis present

## 2019-06-17 DIAGNOSIS — Z888 Allergy status to other drugs, medicaments and biological substances status: Secondary | ICD-10-CM

## 2019-06-17 DIAGNOSIS — I249 Acute ischemic heart disease, unspecified: Secondary | ICD-10-CM

## 2019-06-17 DIAGNOSIS — I129 Hypertensive chronic kidney disease with stage 1 through stage 4 chronic kidney disease, or unspecified chronic kidney disease: Secondary | ICD-10-CM | POA: Diagnosis present

## 2019-06-17 DIAGNOSIS — G8929 Other chronic pain: Secondary | ICD-10-CM | POA: Diagnosis present

## 2019-06-17 DIAGNOSIS — Z79899 Other long term (current) drug therapy: Secondary | ICD-10-CM | POA: Diagnosis not present

## 2019-06-17 DIAGNOSIS — I82409 Acute embolism and thrombosis of unspecified deep veins of unspecified lower extremity: Secondary | ICD-10-CM | POA: Diagnosis present

## 2019-06-17 DIAGNOSIS — J449 Chronic obstructive pulmonary disease, unspecified: Secondary | ICD-10-CM | POA: Diagnosis present

## 2019-06-17 DIAGNOSIS — N1831 Chronic kidney disease, stage 3a: Secondary | ICD-10-CM | POA: Diagnosis present

## 2019-06-17 DIAGNOSIS — Z86718 Personal history of other venous thrombosis and embolism: Secondary | ICD-10-CM

## 2019-06-17 DIAGNOSIS — I214 Non-ST elevation (NSTEMI) myocardial infarction: Secondary | ICD-10-CM | POA: Diagnosis present

## 2019-06-17 DIAGNOSIS — I10 Essential (primary) hypertension: Secondary | ICD-10-CM | POA: Diagnosis not present

## 2019-06-17 DIAGNOSIS — R079 Chest pain, unspecified: Secondary | ICD-10-CM | POA: Diagnosis present

## 2019-06-17 DIAGNOSIS — R1010 Upper abdominal pain, unspecified: Secondary | ICD-10-CM | POA: Diagnosis not present

## 2019-06-17 DIAGNOSIS — Z87891 Personal history of nicotine dependence: Secondary | ICD-10-CM | POA: Diagnosis not present

## 2019-06-17 DIAGNOSIS — M549 Dorsalgia, unspecified: Secondary | ICD-10-CM | POA: Diagnosis present

## 2019-06-17 DIAGNOSIS — E785 Hyperlipidemia, unspecified: Secondary | ICD-10-CM | POA: Diagnosis present

## 2019-06-17 DIAGNOSIS — Z7901 Long term (current) use of anticoagulants: Secondary | ICD-10-CM | POA: Diagnosis not present

## 2019-06-17 DIAGNOSIS — E7800 Pure hypercholesterolemia, unspecified: Secondary | ICD-10-CM | POA: Diagnosis not present

## 2019-06-17 DIAGNOSIS — E1165 Type 2 diabetes mellitus with hyperglycemia: Secondary | ICD-10-CM | POA: Diagnosis present

## 2019-06-17 DIAGNOSIS — I2583 Coronary atherosclerosis due to lipid rich plaque: Secondary | ICD-10-CM | POA: Diagnosis not present

## 2019-06-17 DIAGNOSIS — R1084 Generalized abdominal pain: Secondary | ICD-10-CM | POA: Diagnosis present

## 2019-06-17 DIAGNOSIS — R739 Hyperglycemia, unspecified: Secondary | ICD-10-CM | POA: Diagnosis not present

## 2019-06-17 DIAGNOSIS — R109 Unspecified abdominal pain: Secondary | ICD-10-CM

## 2019-06-17 DIAGNOSIS — R7989 Other specified abnormal findings of blood chemistry: Secondary | ICD-10-CM | POA: Diagnosis not present

## 2019-06-17 DIAGNOSIS — E1122 Type 2 diabetes mellitus with diabetic chronic kidney disease: Secondary | ICD-10-CM | POA: Diagnosis present

## 2019-06-17 DIAGNOSIS — Z6841 Body Mass Index (BMI) 40.0 and over, adult: Secondary | ICD-10-CM | POA: Diagnosis not present

## 2019-06-17 DIAGNOSIS — I252 Old myocardial infarction: Secondary | ICD-10-CM | POA: Diagnosis present

## 2019-06-17 DIAGNOSIS — G894 Chronic pain syndrome: Secondary | ICD-10-CM | POA: Diagnosis present

## 2019-06-17 DIAGNOSIS — N183 Chronic kidney disease, stage 3 unspecified: Secondary | ICD-10-CM | POA: Diagnosis present

## 2019-06-17 HISTORY — DX: Chronic kidney disease, stage 3 unspecified: N18.30

## 2019-06-17 HISTORY — DX: Other chronic pain: G89.29

## 2019-06-17 HISTORY — DX: Morbid (severe) obesity due to excess calories: E66.01

## 2019-06-17 HISTORY — DX: Acute embolism and thrombosis of unspecified deep veins of unspecified lower extremity: I82.409

## 2019-06-17 LAB — CBC WITH DIFFERENTIAL/PLATELET
Abs Immature Granulocytes: 0.05 10*3/uL (ref 0.00–0.07)
Basophils Absolute: 0 10*3/uL (ref 0.0–0.1)
Basophils Relative: 0 %
Eosinophils Absolute: 0.1 10*3/uL (ref 0.0–0.5)
Eosinophils Relative: 1 %
HCT: 45.6 % (ref 36.0–46.0)
Hemoglobin: 14.1 g/dL (ref 12.0–15.0)
Immature Granulocytes: 1 %
Lymphocytes Relative: 12 %
Lymphs Abs: 1.2 10*3/uL (ref 0.7–4.0)
MCH: 26.8 pg (ref 26.0–34.0)
MCHC: 30.9 g/dL (ref 30.0–36.0)
MCV: 86.5 fL (ref 80.0–100.0)
Monocytes Absolute: 0.7 10*3/uL (ref 0.1–1.0)
Monocytes Relative: 7 %
Neutro Abs: 7.4 10*3/uL (ref 1.7–7.7)
Neutrophils Relative %: 79 %
Platelets: 313 10*3/uL (ref 150–400)
RBC: 5.27 MIL/uL — ABNORMAL HIGH (ref 3.87–5.11)
RDW: 17.1 % — ABNORMAL HIGH (ref 11.5–15.5)
WBC: 9.4 10*3/uL (ref 4.0–10.5)
nRBC: 0 % (ref 0.0–0.2)

## 2019-06-17 LAB — BRAIN NATRIURETIC PEPTIDE: B Natriuretic Peptide: 172 pg/mL — ABNORMAL HIGH (ref 0.0–100.0)

## 2019-06-17 LAB — BASIC METABOLIC PANEL
Anion gap: 12 (ref 5–15)
BUN: 12 mg/dL (ref 8–23)
CO2: 21 mmol/L — ABNORMAL LOW (ref 22–32)
Calcium: 9.3 mg/dL (ref 8.9–10.3)
Chloride: 106 mmol/L (ref 98–111)
Creatinine, Ser: 1.04 mg/dL — ABNORMAL HIGH (ref 0.44–1.00)
GFR calc Af Amer: >60 mL/min (ref >60)
GFR calc non Af Amer: 56 mL/min — ABNORMAL LOW (ref >60)
Glucose, Bld: 156 mg/dL — ABNORMAL HIGH (ref 70–99)
Potassium: 4 mmol/L (ref 3.5–5.1)
Sodium: 139 mmol/L (ref 135–145)

## 2019-06-17 LAB — SARS CORONAVIRUS 2 (TAT 6-24 HRS): SARS Coronavirus 2: NEGATIVE

## 2019-06-17 LAB — TROPONIN I (HIGH SENSITIVITY)
Troponin I (High Sensitivity): 213 ng/L (ref <18)
Troponin I (High Sensitivity): 1196 ng/L (ref <18)

## 2019-06-17 SURGERY — CORONARY/GRAFT ACUTE MI REVASCULARIZATION
Anesthesia: LOCAL

## 2019-06-17 MED ORDER — NITROGLYCERIN IN D5W 200-5 MCG/ML-% IV SOLN
0.0000 ug/min | INTRAVENOUS | Status: DC
  Administered 2019-06-17: 5 ug/min via INTRAVENOUS
  Filled 2019-06-17: qty 250

## 2019-06-17 MED ORDER — ACETAMINOPHEN 325 MG PO TABS
650.0000 mg | ORAL_TABLET | ORAL | Status: DC | PRN
  Administered 2019-06-17: 650 mg via ORAL
  Filled 2019-06-17 (×2): qty 2

## 2019-06-17 MED ORDER — ONDANSETRON HCL 4 MG/2ML IJ SOLN
4.0000 mg | Freq: Four times a day (QID) | INTRAMUSCULAR | Status: DC | PRN
  Administered 2019-06-17 – 2019-06-18 (×2): 4 mg via INTRAVENOUS
  Filled 2019-06-17 (×2): qty 2

## 2019-06-17 MED ORDER — HEPARIN (PORCINE) 25000 UT/250ML-% IV SOLN
1350.0000 [IU]/h | INTRAVENOUS | Status: DC
  Administered 2019-06-17: 1150 [IU]/h via INTRAVENOUS
  Filled 2019-06-17: qty 250

## 2019-06-17 MED ORDER — NITROGLYCERIN 0.4 MG SL SUBL
0.4000 mg | SUBLINGUAL_TABLET | SUBLINGUAL | Status: DC | PRN
  Administered 2019-06-18 (×2): 0.4 mg via SUBLINGUAL
  Filled 2019-06-17: qty 1

## 2019-06-17 MED ORDER — ATORVASTATIN CALCIUM 80 MG PO TABS
80.0000 mg | ORAL_TABLET | Freq: Every day | ORAL | Status: DC
  Administered 2019-06-18 – 2019-06-22 (×5): 80 mg via ORAL
  Filled 2019-06-17 (×5): qty 1

## 2019-06-17 MED ORDER — ASPIRIN EC 81 MG PO TBEC
81.0000 mg | DELAYED_RELEASE_TABLET | Freq: Every day | ORAL | Status: DC
  Administered 2019-06-19: 81 mg via ORAL
  Filled 2019-06-17: qty 1

## 2019-06-17 MED ORDER — SODIUM CHLORIDE 0.9 % IV BOLUS
500.0000 mL | Freq: Once | INTRAVENOUS | Status: AC
  Administered 2019-06-17: 500 mL via INTRAVENOUS

## 2019-06-17 NOTE — ED Triage Notes (Signed)
Pt BIB [facility] from home. Pt complaining of chest pain that began 0200 this morning. Per EMS also slight right facial droop LKW 1800 last pm. Pt received 4 baby aspirin, 1 nitro, and 8 mg zofran via EMS. Pain 7/10 upon arrival.

## 2019-06-17 NOTE — ED Provider Notes (Signed)
[facility] EMERGENCY DEPARTMENT Provider Note   CSN: [id number] Arrival date & time: 06/17/19  1651     History   Chief Complaint Chest pain  HPI Mary Ferguson is a 68 y.o. female with a history of COPD presenting to the emerge department chest pain.  Patient reports the pain began around 2 in the morning.  She describes a sharp pressure sensation in the right side of her chest.  Has been constant.  She called EMS who gave the patient full dose aspirin as well as nitro x 1, which she reports may have had minimal improvement of her symptoms.  She states she has had prior episodes of this similar chest pain in the past but is unsure if they are associated with exertion.  The pain does not radiate anywhere.  She denies any history of MI.  Of note the patient presented as a STEMI alert from the field.  She was noted to have ST elevations in inferior lead III and aVF, with possible reciprocal depressions in the lateral leads.    HPI  Past Medical History:  Diagnosis Date  . Asthma   . Chronic pain   . DVT (deep vein thrombosis) in pregnancy   . Hypertension     Patient Active Problem List   Diagnosis Date Noted  . NSTEMI (non-ST elevated myocardial infarction) (HCC) 06/17/2019  . DVT (deep venous thrombosis) (HCC) 09/29/2018    Past Surgical History:  Procedure Laterality Date  . EYE SURGERY    . HERNIA REPAIR    . VEIN SURGERY       OB History   No obstetric history on file.      Home Medications    Prior to Admission medications   Medication Sig Start Date End Date Taking? Authorizing Provider  clobetasol ointment (TEMOVATE) 0.05 % Apply 1 application topically 2 (two) times daily. 09/22/18  Yes [provider]  hydrOXYzine (ATARAX/VISTARIL) 25 MG tablet Take 25 mg by mouth 2 (two) times daily as needed for itching.  09/22/18  Yes [provider]  metroNIDAZOLE (METROCREAM) 0.75 % cream Apply 1 application topically 2 (two) times  daily. 11/19/17  Yes [provider]  promethazine (PHENERGAN) 25 MG tablet Take 25 mg by mouth daily as needed for nausea.  03/10/15  Yes [provider]  psyllium (METAMUCIL SMOOTH TEXTURE) 28 % packet Take 1 packet by mouth 2 (two) times daily.   Yes [provider]  rivaroxaban (XARELTO) 10 MG TABS tablet Take 10 mg by mouth every evening.   Yes [provider]  atorvastatin (LIPITOR) 10 MG tablet Take 10 mg by mouth daily. 06/16/19   [provider]  Rivaroxaban 15 & 20 MG TBPK Take as directed on package: Start with one 15mg  tablet by mouth twice a day with food. On Day 22, switch to one 20mg  tablet once a day with food. Patient not taking: Reported on 06/17/2019 10/01/18   06/19/2019, MD    Family History No family history on file.  Social History Social History   Tobacco Use  . Smoking status: Former 11/30/18  . Smokeless tobacco: Never Used  Substance Use Topics  . Alcohol use: Never    Frequency: Never  . Drug use: Never     Allergies   Rofecoxib, Tetracyclines & related, Benadryl allergy [diphenhydramine hcl], Cephalexin, and Gatifloxacin   Review of Systems Review of Systems  Constitutional: Negative for chills and fever.  Eyes: Negative for photophobia and  visual disturbance.  Respiratory: Positive for shortness of breath. Negative for cough.   Cardiovascular: Positive for chest pain. Negative for palpitations.  Gastrointestinal: Positive for nausea. Negative for abdominal pain and vomiting.  Neurological: Positive for light-headedness. Negative for syncope.  All other systems reviewed and are negative.    Physical Exam Updated Vital Signs BP 138/66   Pulse 60   Temp 98.2 F (36.8 C) (Oral)   Resp 17   Ht 5\' 3"  (1.6 m)   Wt 116.8 kg   SpO2 98%   BMI 45.60 kg/m   Physical Exam Vitals signs and nursing note reviewed.  Constitutional:      General: She is not in acute distress.    Appearance: She is  well-developed.  HENT:     Head: Normocephalic and atraumatic.  Eyes:     Conjunctiva/sclera: Conjunctivae normal.  Neck:     Musculoskeletal: Neck supple.  Cardiovascular:     Rate and Rhythm: Normal rate and regular rhythm.     Pulses: Normal pulses.  Pulmonary:     Effort: Pulmonary effort is normal. No respiratory distress.     Comments: Crackles in lung bases 93% on room air Abdominal:     Palpations: Abdomen is soft.     Tenderness: There is no abdominal tenderness.  Skin:    General: Skin is warm and dry.  Neurological:     Mental Status: She is alert.      ED Treatments / Results  Labs (all labs ordered are listed, but only abnormal results are displayed) Labs Reviewed  BRAIN NATRIURETIC PEPTIDE - Abnormal; Notable for the following components:      Result Value   B Natriuretic Peptide 172.0 (*)    All other components within normal limits  BASIC METABOLIC PANEL - Abnormal; Notable for the following components:   CO2 21 (*)    Glucose, Bld 156 (*)    Creatinine, Ser 1.04 (*)    GFR calc non Af Amer 56 (*)    All other components within normal limits  CBC WITH DIFFERENTIAL/PLATELET - Abnormal; Notable for the following components:   RBC 5.27 (*)    RDW 17.1 (*)    All other components within normal limits  TROPONIN I (HIGH SENSITIVITY) - Abnormal; Notable for the following components:   Troponin I (High Sensitivity) 213 (*)    All other components within normal limits  TROPONIN I (HIGH SENSITIVITY) - Abnormal; Notable for the following components:   Troponin I (High Sensitivity) 1,196 (*)    All other components within normal limits  SARS CORONAVIRUS 2 (TAT 6-24 HRS)  CBC  HEPARIN LEVEL (UNFRACTIONATED)  APTT  HEMOGLOBIN X7D  BASIC METABOLIC PANEL  LIPID PANEL    EKG EKG Interpretation  Date/Time:  Thursday June 17 2019 17:43:07 EDT Ventricular Rate:  71 PR Interval:    QRS Duration: 103 QT Interval:  431 QTC Calculation: 469 R  Axis:   -10 Text Interpretation:  Sinus rhythm Minimal ST depression, lateral leads Again isolated elevation in lead III, minor depression in lead I, no significant change from prior ECG, does not meet STEMI  criteria Confirmed by Octaviano Glow [phone]) on 06/17/2019 5:45:55 PM   Radiology Dg Chest Portable 1 View  Result Date: 06/17/2019 CLINICAL DATA:  Chest pain EXAM: PORTABLE CHEST 1 VIEW COMPARISON:  None. FINDINGS: Low lung volumes. Streaky basilar atelectasis or scar. Cardiomegaly. No pneumothorax. IMPRESSION: 1. Low lung volumes with streaky basilar atelectasis or scar 2. Mild cardiomegaly Electronically  Signed   By: Jasmine PangKim  Fujinaga M.D.   On: 06/17/2019 18:11    Procedures .Critical Care Performed by: Terald Sleeperrifan, Keishana Klinger J, MD Authorized by: Terald Sleeperrifan, Culver Feighner J, MD   Critical care provider statement:    Critical care time (minutes):  40   Critical care was necessary to treat or prevent imminent or life-threatening deterioration of the following conditions:  Cardiac failure   Critical care was time spent personally by me on the following activities:  Discussions with consultants, evaluation of patient's response to treatment, examination of patient, ordering and performing treatments and interventions, ordering and review of laboratory studies, ordering and review of radiographic studies, pulse oximetry, re-evaluation of patient's condition, obtaining history from patient or surrogate and review of old charts   (including critical care time)  Medications Ordered in ED Medications  heparin ADULT infusion 100 units/mL (25000 units/26450mL sodium chloride 0.45%) (1,150 Units/hr Intravenous New Bag/Given 06/17/19 1920)  aspirin EC tablet 81 mg (has no administration in time range)  nitroGLYCERIN (NITROSTAT) SL tablet 0.4 mg (has no administration in time range)  acetaminophen (TYLENOL) tablet 650 mg (650 mg Oral Given 06/17/19 2225)  ondansetron (ZOFRAN) injection 4 mg (4 mg Intravenous Given  06/17/19 2034)  atorvastatin (LIPITOR) tablet 80 mg (has no administration in time range)  nitroGLYCERIN 50 mg in dextrose 5 % 250 mL (0.2 mg/mL) infusion (10 mcg/min Intravenous Rate/Dose Change 06/17/19 2224)  sodium chloride 0.9 % bolus 500 mL (0 mLs Intravenous Stopped 06/17/19 1921)     Initial Impression / Assessment and Plan / ED Course  I have reviewed the triage vital signs and the nursing notes.  Pertinent labs & imaging results that were available during my care of the patient were reviewed by me and considered in my medical decision making (see chart for details).  68 yo female presenting as a STEMI alert with chest pain ongoing since 0200, woke her up from sleep.  Received full dose aspirin and 1 SL nitro in the field.  Patient reporting continued chest pain.    Initial ECG on arrival in the ED does not show significant ST elevations in 2 continuous inferior leads, or reciprocal changes.  Her history and field report remains concerning for ECG, but she does not meet STEMI criteria on arrival. 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  Plan to initiate on heparin regardless for ACS and will discuss with cardiology      Clinical Course as of Jun 18 127  Thu Jun 17, 2019  1744 ECG here on arrival and repeated does not meet STEMI criteria, no significant elevations in multiple inferior leads, will have cardiology evaluate patient at the bedside.  Dr Tresa EndoKelly contacted. She is stable, pain is under control.   [MT]  1745 Repeat ECG unchanged from prior, Dr Tresa EndoKelly to come see patient.   [MT]  1814 IMPRESSION: 1. Low lung volumes with streaky basilar atelectasis or scar 2. Mild cardiomegaly   [MT]  1920 Still awaiting call back from cardiology   [MT]  1945 Seen by Dr. Rubie Maidroituro of cardiology and admitted to their service   [MT]    Clinical Course User Index [MT] Raelin Pixler, Kermit BaloMatthew J, MD    Final  Clinical Impressions(s) / ED Diagnoses   Final diagnoses:  ACS (acute coronary syndrome) [facility](HCC)    ED Discharge Orders    None       Deaire Mcwhirter, Kermit BaloMatthew J, MD 06/18/19  0129  

## 2019-06-17 NOTE — Progress Notes (Addendum)
ANTICOAGULATION CONSULT NOTE - Initial Consult  Pharmacy Consult for Heparin Indication: chest pain/ACS  Allergies  Allergen Reactions  . Rofecoxib Anaphylaxis    VIOXX  . Tetracyclines & Related Nausea And Vomiting  . Benadryl Allergy [Diphenhydramine Hcl] Other (See Comments)    Depressed respirations  . Cephalexin Rash  . Gatifloxacin Other (See Comments)    Confusion Very low blood pressure    Patient Measurements: Height: 5\' 3"  (160 cm) Weight: 259 lb (117.5 kg) IBW/kg (Calculated) : 52.4 Heparin Dosing Weight: 81 kg  Vital Signs: Temp: 97.6 F (36.4 C) (10/22 1659) Temp Source: Oral (10/22 1659) BP: 119/53 (10/22 1815) Pulse Rate: 53 (10/22 1815)  Labs: Recent Labs    06/17/19 1721  HGB 14.1  HCT 45.6  PLT 313  CREATININE 1.04*  TROPONINIHS 213*    Estimated Creatinine Clearance: 65 mL/min (A) (by C-G formula based on SCr of 1.04 mg/dL (H)).   Medical History: Past Medical History:  Diagnosis Date  . Asthma   . Chronic pain   . DVT (deep vein thrombosis) in pregnancy   . Hypertension     Assessment: 68 y.o. female with a history of COPD presenting to the emerge department chest pain. She was noted to have ST elevations in inferior  lead III and aVF.  Initial troponin elevated at 213.  Pharmacy consulted to initiate heparin.  Patient is on Xarelto at home and her last dose was yesterday.  Will not bolus and will monitor both Heparin levels and aPTTs until the Xarelto is out of her system.  Goal of Therapy:  Heparin level 0.3-0.7 units/ml aPTT 66-102 seconds Monitor platelets by anticoagulation protocol: Yes   Plan:  Start heparin infusion at 1150 units/hr Check anti-Xa level in 6-8 hours and daily while on heparin Continue to monitor H&H and platelets  Check aPTT level 6 - 8 hours and daily until Xarelto is out of the patient's system   Alanda Slim, PharmD, [place] Pharmacist Please see AMION for all Pharmacists' Contact Phone  Numbers 06/17/2019, 6:56 PM

## 2019-06-17 NOTE — H&P (Signed)
Cardiology Admission History and Physical:   Patient ID: Mary Ferguson MRN: [id number]; DOB: 03/04/1951   Admission date: 06/17/2019  Primary Care Provider: Merlene Laughter, MD Primary Cardiologist: No primary care provider on file. New Primary Electrophysiologist:  None   Chief Complaint:  Chest pain  Patient Profile:   Mary Ferguson is a 68 y.o. female with history of recurrent DVT, HTN, chronic pain, presenting with chest pain.  Initially deemed to be a STEMI patient in the field, ECG was evaluated by Dr. Tresa Endo, interventional cardiology and the decision was made not to perform emergency cardiac catheterization.  History of Present Illness:   Mary Ferguson was awoken from sleep with chest discomfort at 2 AM this morning.  At that time it was also associated with rapid palpitations.  Her pulse oximeter showed normal oxygen saturation but a heart rate of about 150 bpm.  The symptoms gradually subsided spontaneously but have recurred at least twice throughout the day, most recently at 2 PM.  Each time she reports that her heart will be racing.  Her chest pain has now improved, but is back to what is a baseline complaint of abdominal discomfort radiating up into her chest at 7-8/10.  This is always present.  The chest discomfort is very much pleuritic in nature.  It feels better when she sits up and is worsened by deep respiration.  She has not had cough or hemoptysis, but her breathing is difficult because of the pain.  She does not describe frank orthopnea.  She has not experienced syncope or dizziness.  She has a lot of nausea but has not vomited.  Her ECG shows roughly 0.5-1 mm ST segment elevation seen most prominently in leads III and aVF but also in V4-V6 with what appears to be reciprocal ST depression in lead V2, 1, aVL.  There are Q waves in lead III, but not in lead II or aVF.  A repeat electrocardiogram performed just 1 hour after arrival shows a virtual resolution of the ST segment  depression in lead V2 and improvement in 1 and aVL.  Her first high-sensitivity troponin is elevated at 213.  Her review of systems is significant for very numerous complaints that are difficult to tease out.  She believes that she had an acute viral illness in early September that lasted for at least 2 weeks.  At one point she lost her sense of smell and she had issues with diarrhea.  She has been feeling weak ever since then.  She no longer has anosmia.  She never sought medical attention and has not been tested for coronavirus.  She does not have any known Covid contacts.  She does not have recent fever or chills or diarrhea.  She has a history of chronic pain in her back related to degenerative disc disease and chronic abdominal pain related to multiple previous surgeries.  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  She has had problems with irregular bowel movements and abdominal pain ever since.  Lives with "chronic 7-8/10" pain.  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  She is afraid of becoming addicted to opiates again.  She was hospitalized in February 2020 with recurrent right lower extremity DVT and was restarted on oral anticoagulants.  She reports being extremely compliant with anticoagulants ever since, despite reports of  noncompliance in her chart.  She thinks that she may have taken today's dose of Xarelto around 3 PM since she was having bad chest pain and she thought it would help.  She is however not entirely sure.  She usually takes the medication around 4:00 in the afternoon.  Heart Pathway Score:     Past Medical History:  Diagnosis Date  . Asthma   . Chronic pain   . DVT (deep vein thrombosis) in pregnancy   . Hypertension     Past Surgical History:  Procedure Laterality Date  . EYE SURGERY     . HERNIA REPAIR    . VEIN SURGERY       Medications Prior to Admission: Prior to Admission medications   Medication Sig Start Date End Date Taking? Authorizing Provider  clobetasol ointment (TEMOVATE) 0.05 % Apply 1 application topically 2 (two) times daily. 09/22/18  Yes [provider]  hydrOXYzine (ATARAX/VISTARIL) 25 MG tablet Take 25 mg by mouth 2 (two) times daily as needed for itching.  09/22/18  Yes [provider]  metroNIDAZOLE (METROCREAM) 0.75 % cream Apply 1 application topically 2 (two) times daily. 11/19/17  Yes [provider]  promethazine (PHENERGAN) 25 MG tablet Take 25 mg by mouth daily as needed for nausea.  03/10/15  Yes [provider]  psyllium (METAMUCIL SMOOTH TEXTURE) 28 % packet Take 1 packet by mouth 2 (two) times daily.   Yes [provider]  rivaroxaban (XARELTO) 10 MG TABS tablet Take 10 mg by mouth every evening.   Yes [provider]  atorvastatin (LIPITOR) 10 MG tablet Take 10 mg by mouth daily. 06/16/19   [provider]  Rivaroxaban 15 & 20 MG TBPK Take as directed on package: Start with one 15mg  tablet by mouth twice a day with food. On Day 22, switch to one 20mg  tablet once a day with food. Patient not taking: Reported on 06/17/2019 10/01/18   Dimple NanasAmin, Ankit Chirag, MD     Allergies:    Allergies  Allergen Reactions  . Rofecoxib Anaphylaxis    VIOXX  . Tetracyclines & Related Nausea And Vomiting  . Benadryl Allergy [Diphenhydramine Hcl] Other (See Comments)    Depressed respirations  . Cephalexin Rash  . Gatifloxacin Other (See Comments)    Confusion Very low blood pressure    Social History:   Social History   Socioeconomic History  . Marital status: Widowed    Spouse name: Not on file  . Number of children: Not on file  . Years of education: Not on file  . Highest education level: Not on file  Occupational History  . Not on file  Social Needs  . Financial resource strain: Not  on file  . Food insecurity    Worry: Not on file    Inability: Not on file  . Transportation needs    Medical: Not on file    Non-medical: Not on file  Tobacco Use  . Smoking status: Former Games developermoker  . Smokeless tobacco: Never Used  Substance and Sexual Activity  . Alcohol use: Never    Frequency: Never  . Drug use: Never  . Sexual activity: Not on file  Lifestyle  . Physical activity    Days per week: Not on file    Minutes per session: Not on file  . Stress: Not on file  Relationships  . Social Musicianconnections    Talks on phone: Not on file    Gets together: Not on file  Attends religious service: Not on file    Active member of club or organization: Not on file    Attends meetings of clubs or organizations: Not on file    Relationship status: Not on file  . Intimate partner violence    Fear of current or ex partner: Not on file    Emotionally abused: Not on file    Physically abused: Not on file    Forced sexual activity: Not on file  Other Topics Concern  . Not on file  Social History Narrative  . Not on file    Family History:   The patient's Family history is negative for premature coronary or peripheral vascular disease or hypercoagulable states  ROS:  Please see the history of present illness.  All other ROS reviewed and negative.     Physical Exam/Data:   Vitals:   06/17/19 1730 06/17/19 1745 06/17/19 1800 06/17/19 1815  BP: (!) 149/69 138/67 139/63 (!) 119/53  Pulse: 64 64 (!) 53 (!) 53  Resp: (!) 23 19 16 17   Temp:      TempSrc:      SpO2: 99% 100% 100% 95%  Weight:      Height:       No intake or output data in the 24 hours ending 06/17/19 1920 Last 3 Weights 06/17/2019 09/29/2018  Weight (lbs) 259 lb 248 lb  Weight (kg) 117.482 kg 112.492 kg     Body mass index is 45.88 kg/m.  General: Morbidly obese, well developed, in no acute distress HEENT: normal Lymph: no adenopathy Neck: no JVD Endocrine:  No thryomegaly Vascular: No carotid bruits;  FA pulses 2+ bilaterally without bruits  Cardiac:  normal S1, S2; RRR; no murmur .  I do not hear pericardial rub Lungs:  clear to auscultation bilaterally, no wheezing, rhonchi or rales  Abd: soft, nontender, no hepatomegaly  Ext: Trace bilateral ankle edema Musculoskeletal:  No deformities, BUE and BLE strength normal and equal Skin: warm and dry  Neuro:  CNs 2-12 intact, no focal abnormalities noted Psych:  Normal affect    EKG:  The ECG that was done by EMS was personally reviewed and demonstrates sinus rhythm with ST segment elevation in the inferior leads and lateral precordial leads and reciprocal ST segment depression in the high lateral leads and V2.  A repeat ECG shows improvement in ST segment deviation.  Relevant CV Studies: n/a  Laboratory Data:  High Sensitivity Troponin:   Recent Labs  Lab 06/17/19 1721  TROPONINIHS 213*      Chemistry Recent Labs  Lab 06/17/19 1721  NA 139  K 4.0  CL 106  CO2 21*  GLUCOSE 156*  BUN 12  CREATININE 1.04*  CALCIUM 9.3  GFRNONAA 56*  GFRAA >60  ANIONGAP 12    No results for input(s): PROT, ALBUMIN, AST, ALT, ALKPHOS, BILITOT in the last 168 hours. Hematology Recent Labs  Lab 06/17/19 1721  WBC 9.4  RBC 5.27*  HGB 14.1  HCT 45.6  MCV 86.5  MCH 26.8  MCHC 30.9  RDW 17.1*  PLT 313   BNP Recent Labs  Lab 06/17/19 1721  BNP 172.0*    DDimer No results for input(s): DDIMER in the last 168 hours.   Radiology/Studies:  Dg Chest Portable 1 View  Result Date: 06/17/2019 CLINICAL DATA:  Chest pain EXAM: PORTABLE CHEST 1 VIEW COMPARISON:  None. FINDINGS: Low lung volumes. Streaky basilar atelectasis or scar. Cardiomegaly. No pneumothorax. IMPRESSION: 1. Low lung volumes with streaky basilar  atelectasis or scar 2. Mild cardiomegaly Electronically Signed   By: Jasmine Pang M.D.   On: 06/17/2019 18:11    Assessment and Plan:   1. NSTEMI versus acute myopericarditis: The pleuritic nature of her pain and its  positional improvement is more consistent with acute pericarditis, but the ECG is more suggestive of acute myocardial infarction, possibly delayed presentation of MI in the territory of the left circumflex coronary artery.  Symptoms have improved since arrival and it's possible that she took Xarelto just a couple of hours before admission.  The troponin elevation is minor.  Repeat high-sensitivity troponin is pending.  If this shows a dramatic increase I would favor proceeding with invasive evaluation, not withstanding the oral anticoagulant.  Her son will check her medicine bottles at home and will tell us whether she has received Xarelto today.  We will check an echocardiogram looking for regional wall motion abnormalities.  In the meantime we will start intravenous heparin and nitroglycerin.  2. Hx of DVT: Reports compliance with anticoagulation, although is not entirely clear whether she received Xarelto today yet.  Chest x-ray, physical exam and EKG do not appear consistent with acute pulmonary embolism, but this should be kept in the differential diagnosis.  We will start intravenous heparin.  3.  Morbid obesity  Severity of Illness: The appropriate patient status for this patient is OBSERVATION. Observation status is judged to be reasonable and necessary in order to provide the required intensity of service to ensure the patient's safety. The patient's presenting symptoms, physical exam findings, and initial radiographic and laboratory data in the context of their medical condition is felt to place them at decreased risk for further clinical deterioration. Furthermore, it is anticipated that the patient will be medically stable for discharge from the hospital within 2 midnights of admission. The following factors support the patient status of observation.   " The patient's presenting symptoms include severe chest discomfort, nausea. " The physical exam findings include morbid obesity. " The initial  radiographic and laboratory data are abnormal high-sensitivity troponin and abnormal electrocardiogram, worrisome for acute myocardial infarction.     For questions or updates, please contact [facility] Please consult [email or web address] for contact info under        Signed, Thurmon Fair, MD  06/17/2019 7:20 PM

## 2019-06-17 NOTE — ED Notes (Signed)
ED TO INPATIENT HANDOFF REPORT  ED Nurse Name and Phone #:  [phone]  S Name/Age/Gender Mary MohLyle Ferguson 68 y.o. female Room/Bed: 031C/031C  Code Status   Code Status: Full Code  Home/SNF/Other Home Patient oriented to: self, place, time and situation Is this baseline? Yes   Triage Complete: Triage complete  Chief Complaint CODE STEMI  Triage Note Pt BIB [facility] from home. Pt complaining of chest pain that began 0200 this morning. Per EMS also slight right facial droop LKW 1800 last pm. Pt received 4 baby aspirin, 1 nitro, and 8 mg zofran via EMS. Pain 7/10 upon arrival.   Allergies Allergies  Allergen Reactions  . Rofecoxib Anaphylaxis    VIOXX  . Tetracyclines & Related Nausea And Vomiting  . Benadryl Allergy [Diphenhydramine Hcl] Other (See Comments)    Depressed respirations  . Cephalexin Rash  . Gatifloxacin Other (See Comments)    Confusion Very low blood pressure    Level of Care/Admitting Diagnosis ED Disposition    ED Disposition Condition Comment   Admit  [facility] [[id number]]  Level of Care: Progressive [102]  Covid Evaluation: Asymptomatic Screening Protocol (No Symptoms)  Diagnosis: NSTEMI (non-ST elevated myocardial infarction) [place](HCC) [[id number]]) [[id number]]  Admitting Physician: Thurmon FairROITORU, MIHAI [[id number]]  Attending Physician: Thurmon FairROITORU, MIHAI [[id number]]  Estimated length of stay: past midnight tomorrow  Certification:: I certify this patient will need inpatient services for at least 2 midnights  PT Class (Do Not Modify): Inpatient [101]  PT Acc Code (Do Not Modify): Private [1]       B Medical/Surgery History Past Medical History:  Diagnosis Date  . Asthma   . Chronic pain   . DVT (deep vein thrombosis) in pregnancy   . Hypertension    Past Surgical History:  Procedure Laterality Date  . EYE SURGERY    . HERNIA REPAIR    . VEIN SURGERY       A IV Location/Drains/Wounds Patient Lines/Drains/Airways Status   Active  Line/Drains/Airways    Name:   Placement date:   Placement time:   Site:   Days:   Peripheral IV 06/17/19 Left Antecubital   06/17/19    1653    Antecubital   less than 1   Peripheral IV 06/17/19 Left Forearm   06/17/19    2034    Forearm   less than 1          Intake/Output Last 24 hours  Intake/Output Summary (Last 24 hours) at 06/17/2019 2114 Last data filed at 06/17/2019 1921 Gross per 24 hour  Intake 1000 ml  Output -  Net 1000 ml    Labs/Imaging Results for orders placed or performed during the hospital encounter of 06/17/19 (from the past 48 hour(s))  Troponin I (High Sensitivity)     Status: Abnormal   Collection Time: 06/17/19  5:21 PM  Result Value Ref Range   Troponin I (High Sensitivity) 213 (HH) <18 ng/L    Comment: CRITICAL RESULT CALLED TO, READ BACK BY AND VERIFIED WITH: R HARDY,RN 1827 06/17/2019 WBOND (NOTE) Elevated high sensitivity troponin I (hsTnI) values and significant  changes across serial measurements may suggest ACS but many other  chronic and acute conditions are known to elevate hsTnI results.  Refer to the Links section for chest pain algorithms and additional  guidance. Performed at [facility], [address] [place], [place] [id number]   Brain natriuretic peptide     Status: Abnormal   Collection Time: 06/17/19  5:21 PM  Result Value Ref Range   B Natriuretic Peptide 172.0 (H) 0.0 - 100.0 pg/mL    Comment: Performed at [facility] [address], [place], [place] [address]  Basic metabolic panel     Status: Abnormal   Collection Time: 06/17/19  5:21 PM  Result Value Ref Range   Sodium 139 135 - 145 mmol/L   Potassium 4.0 3.5 - 5.1 mmol/L   Chloride 106 98 - 111 mmol/L   CO2 21 (L) 22 - 32 mmol/L   Glucose, Bld 156 (H) 70 - 99 mg/dL   BUN 12 8 - 23 mg/dL   Creatinine, Ser 1.04 (H) 0.44 - 1.00 mg/dL   Calcium 9.3 8.9 - 10.3 mg/dL   GFR calc non Af Amer 56 (L) >60 mL/min   GFR calc Af Amer >60 >60 mL/min   Anion gap  12 5 - 15    Comment: Performed at [facility] [address], [place], [place] [address]  CBC with Differential     Status: Abnormal   Collection Time: 06/17/19  5:21 PM  Result Value Ref Range   WBC 9.4 4.0 - 10.5 K/uL   RBC 5.27 (H) 3.87 - 5.11 MIL/uL   Hemoglobin 14.1 12.0 - 15.0 g/dL   HCT 45.6 36.0 - 46.0 %   MCV 86.5 80.0 - 100.0 fL   MCH 26.8 26.0 - 34.0 pg   MCHC 30.9 30.0 - 36.0 g/dL   RDW 17.1 (H) 11.5 - 15.5 %   Platelets 313 150 - 400 K/uL   nRBC 0.0 0.0 - 0.2 %   Neutrophils Relative % 79 %   Neutro Abs 7.4 1.7 - 7.7 K/uL   Lymphocytes Relative 12 %   Lymphs Abs 1.2 0.7 - 4.0 K/uL   Monocytes Relative 7 %   Monocytes Absolute 0.7 0.1 - 1.0 K/uL   Eosinophils Relative 1 %   Eosinophils Absolute 0.1 0.0 - 0.5 K/uL   Basophils Relative 0 %   Basophils Absolute 0.0 0.0 - 0.1 K/uL   Immature Granulocytes 1 %   Abs Immature Granulocytes 0.05 0.00 - 0.07 K/uL    Comment: Performed at [facility] [address], [place], [place] [address]  Troponin I (High Sensitivity)     Status: Abnormal   Collection Time: 06/17/19  7:21 PM  Result Value Ref Range   Troponin I (High Sensitivity) 1,196 (HH) <18 ng/L    Comment: CRITICAL VALUE NOTED.  VALUE IS CONSISTENT WITH PREVIOUSLY REPORTED AND CALLED VALUE. (NOTE) Elevated high sensitivity troponin I (hsTnI) values and significant  changes across serial measurements may suggest ACS but many other  chronic and acute conditions are known to elevate hsTnI results.  Refer to the Links section for chest pain algorithms and additional  guidance. Performed at [facility], [place] [address] [place], [place] [address]    Dg Chest Portable 1 View  Result Date: 06/17/2019 CLINICAL DATA:  Chest pain EXAM: PORTABLE CHEST 1 VIEW COMPARISON:  None. FINDINGS: Low lung volumes. Streaky basilar atelectasis or scar. Cardiomegaly. No pneumothorax. IMPRESSION: 1. Low lung volumes with streaky basilar atelectasis or scar 2.  Mild cardiomegaly Electronically Signed   By: Donavan Foil M.D.   On: 06/17/2019 18:11    Pending Labs Unresulted Labs (From admission, onward)    Start     Ordered   06/19/19 0500  Heparin level (unfractionated)  Daily,   R     06/17/19 1859  06/19/19 0500  APTT  Daily,   R     06/17/19 1859   06/18/19 0500  CBC  Daily,   R     06/17/19 1859   06/18/19 0500  Hemoglobin A1c  Tomorrow morning,   R     06/17/19 2012   06/18/19 0500  Basic metabolic panel  Tomorrow morning,   R     06/17/19 2012   06/18/19 0500  Lipid panel  Tomorrow morning,   R     06/17/19 2012   06/18/19 0300  Heparin level (unfractionated)  Once-Timed,   STAT     06/17/19 1859   06/18/19 0300  APTT  Once,   STAT     06/17/19 1859   06/17/19 1721  SARS CORONAVIRUS 2 (TAT 6-24 HRS) Nasopharyngeal Nasopharyngeal Swab  (Asymptomatic/Tier 2 Patients Labs)  Once,   STAT    Question Answer Comment  Is this test for diagnosis or screening Screening   Symptomatic for COVID-19 as defined by CDC No   Hospitalized for COVID-19 No   Admitted to ICU for COVID-19 No   Previously tested for COVID-19 No   Resident in a congregate (group) care setting No   Employed in healthcare setting No   Pregnant No      06/17/19 1721          Vitals/Pain Today's Vitals   06/17/19 1921 06/17/19 1930 06/17/19 2000 06/17/19 2045  BP:  (!) 144/58 (!) 134/56 (!) 138/59  Pulse:  65 (!) 54 (!) 55  Resp:   17 17  Temp:      TempSrc:      SpO2:  99% 100% 100%  Weight:      Height:      PainSc: (S) 7        Isolation Precautions No active isolations  Medications Medications  heparin ADULT infusion 100 units/mL (25000 units/244mL sodium chloride 0.45%) (1,150 Units/hr Intravenous New Bag/Given 06/17/19 1920)  aspirin EC tablet 81 mg (has no administration in time range)  nitroGLYCERIN (NITROSTAT) SL tablet 0.4 mg (has no administration in time range)  acetaminophen (TYLENOL) tablet 650 mg (has no administration in time range)   ondansetron (ZOFRAN) injection 4 mg (4 mg Intravenous Given 06/17/19 2034)  atorvastatin (LIPITOR) tablet 80 mg (has no administration in time range)  nitroGLYCERIN 50 mg in dextrose 5 % 250 mL (0.2 mg/mL) infusion (5 mcg/min Intravenous New Bag/Given 06/17/19 2043)  sodium chloride 0.9 % bolus 500 mL (0 mLs Intravenous Stopped 06/17/19 1921)    Mobility walks with person assist Low fall risk   Focused Assessments Cardiac Assessment Handoff:  Cardiac Rhythm: Sinus bradycardia No results found for: CKTOTAL, CKMB, CKMBINDEX, TROPONINI No results found for: DDIMER Does the Patient currently have chest pain? Yes      R Recommendations: See Admitting Provider Note  Report given to:   Additional Notes: -

## 2019-06-17 NOTE — ED Notes (Addendum)
Per Dr. Marletta Lor at bedside, pt appropriate for floor Elevated second delta trop. MD informed CP 7 out of 10, recently initiated NTG for CP relief.

## 2019-06-18 ENCOUNTER — Encounter (HOSPITAL_COMMUNITY): Payer: Self-pay | Admitting: Physician Assistant

## 2019-06-18 ENCOUNTER — Encounter (HOSPITAL_COMMUNITY)
Admission: EM | Disposition: A | Discharge status: Home / Self Care | Payer: Self-pay | Source: Home / Self Care | Attending: Cardiovascular Disease

## 2019-06-18 ENCOUNTER — Inpatient Hospital Stay (HOSPITAL_COMMUNITY): Payer: Medicare Other

## 2019-06-18 ENCOUNTER — Other Ambulatory Visit: Payer: Self-pay

## 2019-06-18 DIAGNOSIS — R079 Chest pain, unspecified: Secondary | ICD-10-CM

## 2019-06-18 DIAGNOSIS — R7989 Other specified abnormal findings of blood chemistry: Secondary | ICD-10-CM

## 2019-06-18 DIAGNOSIS — E785 Hyperlipidemia, unspecified: Secondary | ICD-10-CM

## 2019-06-18 DIAGNOSIS — R739 Hyperglycemia, unspecified: Secondary | ICD-10-CM

## 2019-06-18 DIAGNOSIS — I2510 Atherosclerotic heart disease of native coronary artery without angina pectoris: Secondary | ICD-10-CM

## 2019-06-18 HISTORY — PX: CORONARY STENT INTERVENTION: CATH118234

## 2019-06-18 HISTORY — PX: LEFT HEART CATH AND CORONARY ANGIOGRAPHY: CATH118249

## 2019-06-18 LAB — HEPATIC FUNCTION PANEL
ALT: 19 U/L (ref 0–44)
AST: 60 U/L — ABNORMAL HIGH (ref 15–41)
Albumin: 3.3 g/dL — ABNORMAL LOW (ref 3.5–5.0)
Alkaline Phosphatase: 87 U/L (ref 38–126)
Bilirubin, Direct: <0.1 mg/dL (ref 0.0–0.2)
Total Bilirubin: 0.8 mg/dL (ref 0.3–1.2)
Total Protein: 6.5 g/dL (ref 6.5–8.1)

## 2019-06-18 LAB — CBC
HCT: 43.3 % (ref 36.0–46.0)
Hemoglobin: 13.5 g/dL (ref 12.0–15.0)
MCH: 26.5 pg (ref 26.0–34.0)
MCHC: 31.2 g/dL (ref 30.0–36.0)
MCV: 85.1 fL (ref 80.0–100.0)
Platelets: 300 10*3/uL (ref 150–400)
RBC: 5.09 MIL/uL (ref 3.87–5.11)
RDW: 16.8 % — ABNORMAL HIGH (ref 11.5–15.5)
WBC: 7.9 10*3/uL (ref 4.0–10.5)
nRBC: 0 % (ref 0.0–0.2)

## 2019-06-18 LAB — BASIC METABOLIC PANEL
Anion gap: 11 (ref 5–15)
BUN: 11 mg/dL (ref 8–23)
CO2: 21 mmol/L — ABNORMAL LOW (ref 22–32)
Calcium: 9.2 mg/dL (ref 8.9–10.3)
Chloride: 107 mmol/L (ref 98–111)
Creatinine, Ser: 1.01 mg/dL — ABNORMAL HIGH (ref 0.44–1.00)
GFR calc Af Amer: >60 mL/min (ref >60)
GFR calc non Af Amer: 58 mL/min — ABNORMAL LOW (ref >60)
Glucose, Bld: 147 mg/dL — ABNORMAL HIGH (ref 70–99)
Potassium: 3.9 mmol/L (ref 3.5–5.1)
Sodium: 139 mmol/L (ref 135–145)

## 2019-06-18 LAB — APTT: aPTT: 47 seconds — ABNORMAL HIGH (ref 24–36)

## 2019-06-18 LAB — LIPID PANEL
Cholesterol: 165 mg/dL (ref 0–200)
HDL: 32 mg/dL — ABNORMAL LOW (ref >40)
LDL Cholesterol: 104 mg/dL — ABNORMAL HIGH (ref 0–99)
Total CHOL/HDL Ratio: 5.2 RATIO
Triglycerides: 146 mg/dL (ref <150)
VLDL: 29 mg/dL (ref 0–40)

## 2019-06-18 LAB — HEMOGLOBIN A1C
Hgb A1c MFr Bld: 6.5 % — ABNORMAL HIGH (ref 4.8–5.6)
Mean Plasma Glucose: 139.85 mg/dL

## 2019-06-18 LAB — GLUCOSE, CAPILLARY
Glucose-Capillary: 99 mg/dL (ref 70–99)
Glucose-Capillary: 122 mg/dL — ABNORMAL HIGH (ref 70–99)
Glucose-Capillary: 170 mg/dL — ABNORMAL HIGH (ref 70–99)

## 2019-06-18 LAB — HEPARIN LEVEL (UNFRACTIONATED): Heparin Unfractionated: 0.39 IU/mL (ref 0.30–0.70)

## 2019-06-18 LAB — POCT ACTIVATED CLOTTING TIME: Activated Clotting Time: 274 seconds

## 2019-06-18 SURGERY — LEFT HEART CATH AND CORONARY ANGIOGRAPHY
Anesthesia: LOCAL

## 2019-06-18 MED ORDER — CLOPIDOGREL BISULFATE 75 MG PO TABS
75.0000 mg | ORAL_TABLET | Freq: Every day | ORAL | Status: DC
  Administered 2019-06-19 – 2019-06-22 (×4): 75 mg via ORAL
  Filled 2019-06-18 (×4): qty 1

## 2019-06-18 MED ORDER — SODIUM CHLORIDE 0.9% FLUSH: 3.0000 mL | INTRAVENOUS | Status: DC | PRN

## 2019-06-18 MED ORDER — DICYCLOMINE HCL 20 MG PO TABS
20.0000 mg | ORAL_TABLET | Freq: Once | ORAL | Status: AC
  Administered 2019-06-18: 20 mg via ORAL
  Filled 2019-06-18: qty 1

## 2019-06-18 MED ORDER — CLOPIDOGREL BISULFATE 300 MG PO TABS
ORAL_TABLET | ORAL | Status: DC | PRN
  Administered 2019-06-18: 600 mg via ORAL

## 2019-06-18 MED ORDER — CLOPIDOGREL BISULFATE 300 MG PO TABS
ORAL_TABLET | ORAL | Status: AC
  Filled 2019-06-18: qty 2

## 2019-06-18 MED ORDER — HEPARIN SODIUM (PORCINE) 1000 UNIT/ML IJ SOLN
INTRAMUSCULAR | Status: AC
  Filled 2019-06-18: qty 1

## 2019-06-18 MED ORDER — CARVEDILOL 3.125 MG PO TABS
3.1250 mg | ORAL_TABLET | Freq: Two times a day (BID) | ORAL | Status: DC
  Administered 2019-06-18 – 2019-06-22 (×9): 3.125 mg via ORAL
  Filled 2019-06-18 (×9): qty 1

## 2019-06-18 MED ORDER — LIDOCAINE HCL (PF) 1 % IJ SOLN
INTRAMUSCULAR | Status: DC | PRN
  Administered 2019-06-18: 2 mL

## 2019-06-18 MED ORDER — PSYLLIUM 28 % PO PACK: 1.0000 | PACK | Freq: Two times a day (BID) | ORAL | Status: DC

## 2019-06-18 MED ORDER — CLOBETASOL PROPIONATE 0.05 % EX OINT
1.0000 "application " | TOPICAL_OINTMENT | Freq: Two times a day (BID) | CUTANEOUS | Status: DC
  Filled 2019-06-18: qty 15

## 2019-06-18 MED ORDER — PROMETHAZINE HCL 25 MG PO TABS
25.0000 mg | ORAL_TABLET | Freq: Every day | ORAL | Status: DC | PRN
  Administered 2019-06-19 – 2019-06-20 (×2): 25 mg via ORAL
  Filled 2019-06-18 (×2): qty 1

## 2019-06-18 MED ORDER — HEPARIN SODIUM (PORCINE) 1000 UNIT/ML IJ SOLN
INTRAMUSCULAR | Status: DC | PRN
  Administered 2019-06-18: 5000 [IU] via INTRAVENOUS
  Administered 2019-06-18: 7000 [IU] via INTRAVENOUS
  Administered 2019-06-18: 3000 [IU] via INTRAVENOUS

## 2019-06-18 MED ORDER — LIDOCAINE HCL (PF) 1 % IJ SOLN
INTRAMUSCULAR | Status: AC
  Filled 2019-06-18: qty 30

## 2019-06-18 MED ORDER — NITROGLYCERIN 1 MG/10 ML FOR IR/CATH LAB
INTRA_ARTERIAL | Status: DC | PRN
  Administered 2019-06-18 (×2): 200 ug via INTRACORONARY

## 2019-06-18 MED ORDER — HEPARIN (PORCINE) IN NACL 1000-0.9 UT/500ML-% IV SOLN
INTRAVENOUS | Status: AC
  Filled 2019-06-18: qty 1000

## 2019-06-18 MED ORDER — SODIUM CHLORIDE 0.9 % WEIGHT BASED INFUSION
1.0000 mL/kg/h | INTRAVENOUS | Status: DC
  Administered 2019-06-18: 1 mL/kg/h via INTRAVENOUS

## 2019-06-18 MED ORDER — SODIUM CHLORIDE 0.9 % WEIGHT BASED INFUSION: 3.0000 mL/kg/h | INTRAVENOUS | Status: DC

## 2019-06-18 MED ORDER — PSYLLIUM 95 % PO PACK
1.0000 | PACK | Freq: Every day | ORAL | Status: DC
  Administered 2019-06-18 – 2019-06-22 (×5): 1 via ORAL
  Filled 2019-06-18 (×5): qty 1

## 2019-06-18 MED ORDER — SODIUM CHLORIDE 0.9% FLUSH
3.0000 mL | Freq: Two times a day (BID) | INTRAVENOUS | Status: DC
  Administered 2019-06-19 – 2019-06-22 (×6): 3 mL via INTRAVENOUS

## 2019-06-18 MED ORDER — IOHEXOL 350 MG/ML SOLN
INTRAVENOUS | Status: DC | PRN
  Administered 2019-06-18: 170 mL via INTRACARDIAC

## 2019-06-18 MED ORDER — SODIUM CHLORIDE 0.9% FLUSH: 3.0000 mL | Freq: Two times a day (BID) | INTRAVENOUS | Status: DC

## 2019-06-18 MED ORDER — INSULIN ASPART 100 UNIT/ML ~~LOC~~ SOLN
0.0000 [IU] | Freq: Three times a day (TID) | SUBCUTANEOUS | Status: DC
  Administered 2019-06-18: 2 [IU] via SUBCUTANEOUS

## 2019-06-18 MED ORDER — METRONIDAZOLE 0.75 % EX CREA
1.0000 "application " | TOPICAL_CREAM | Freq: Two times a day (BID) | CUTANEOUS | Status: DC
  Filled 2019-06-18: qty 45

## 2019-06-18 MED ORDER — SODIUM CHLORIDE 0.9 % IV SOLN: 250.0000 mL | INTRAVENOUS | Status: DC | PRN

## 2019-06-18 MED ORDER — HEPARIN (PORCINE) IN NACL 1000-0.9 UT/500ML-% IV SOLN
INTRAVENOUS | Status: DC | PRN
  Administered 2019-06-18: 500 mL

## 2019-06-18 MED ORDER — ASPIRIN 81 MG PO CHEW
81.0000 mg | CHEWABLE_TABLET | ORAL | Status: AC
  Administered 2019-06-18: 81 mg via ORAL
  Filled 2019-06-18: qty 1

## 2019-06-18 MED ORDER — OXYCODONE-ACETAMINOPHEN 5-325 MG PO TABS
2.0000 | ORAL_TABLET | Freq: Once | ORAL | Status: AC
  Administered 2019-06-18: 2 via ORAL
  Filled 2019-06-18: qty 2

## 2019-06-18 MED ORDER — FENTANYL CITRATE (PF) 100 MCG/2ML IJ SOLN
INTRAMUSCULAR | Status: DC | PRN
  Administered 2019-06-18: 50 ug via INTRAVENOUS

## 2019-06-18 MED ORDER — MIDAZOLAM HCL 2 MG/2ML IJ SOLN
INTRAMUSCULAR | Status: DC | PRN
  Administered 2019-06-18: 1 mg via INTRAVENOUS

## 2019-06-18 MED ORDER — VERAPAMIL HCL 2.5 MG/ML IV SOLN
INTRAVENOUS | Status: DC | PRN
  Administered 2019-06-18: 10 mL via INTRA_ARTERIAL

## 2019-06-18 MED ORDER — SODIUM CHLORIDE 0.9 % IV SOLN
INTRAVENOUS | Status: AC
  Administered 2019-06-18: 14:00:00 via INTRAVENOUS

## 2019-06-18 MED ORDER — MIDAZOLAM HCL 2 MG/2ML IJ SOLN
INTRAMUSCULAR | Status: AC
  Filled 2019-06-18: qty 2

## 2019-06-18 MED ORDER — METRONIDAZOLE 0.75 % EX GEL
Freq: Two times a day (BID) | CUTANEOUS | Status: DC
  Filled 2019-06-18: qty 45

## 2019-06-18 MED ORDER — FENTANYL CITRATE (PF) 100 MCG/2ML IJ SOLN
INTRAMUSCULAR | Status: AC
  Filled 2019-06-18: qty 2

## 2019-06-18 MED ORDER — VERAPAMIL HCL 2.5 MG/ML IV SOLN
INTRAVENOUS | Status: AC
  Filled 2019-06-18: qty 2

## 2019-06-18 SURGICAL SUPPLY — 17 items
BALLN SAPPHIRE 2.5X12 (BALLOONS) ×2
BALLN SAPPHIRE ~~LOC~~ 3.75X18 (BALLOONS) ×1 IMPLANT
BALLOON SAPPHIRE 2.5X12 (BALLOONS) IMPLANT
CATH INFINITI 5FR JK (CATHETERS) ×1 IMPLANT
CATH LAUNCHER 6FR JR4 (CATHETERS) ×1 IMPLANT
DEVICE RAD COMP TR BAND LRG (VASCULAR PRODUCTS) ×1 IMPLANT
GLIDESHEATH SLEND SS 6F .021 (SHEATH) ×1 IMPLANT
GUIDEWIRE INQWIRE 1.5J.035X260 (WIRE) IMPLANT
INQWIRE 1.5J .035X260CM (WIRE) ×2
KIT ENCORE 26 ADVANTAGE (KITS) ×1 IMPLANT
KIT HEART LEFT (KITS) ×2 IMPLANT
PACK CARDIAC CATHETERIZATION (CUSTOM PROCEDURE TRAY) ×2 IMPLANT
STENT RESOLUTE ONYX 3.0X18 (Permanent Stent) ×1 IMPLANT
STENT RESOLUTE ONYX 3.5X26 (Permanent Stent) ×1 IMPLANT
TRANSDUCER W/STOPCOCK (MISCELLANEOUS) ×2 IMPLANT
TUBING CIL FLEX 10 FLL-RA (TUBING) ×2 IMPLANT
WIRE RUNTHROUGH .014X180CM (WIRE) ×1 IMPLANT

## 2019-06-18 NOTE — Progress Notes (Signed)
Called to see patient SHe complains of severe abdominal pain    SHe has had intermittently for years   Seen in Pain management in the past  Hx of hernias  On exam, pt crying    R wrist  OK   Pressure dressing Abd   + bowel sounds   Active   Soft  No rebound   Tender in mid region  Will try bentyl   She has used in past but she says it won't cut it for this pain  ALso Percocet   Two tabs x 1 Change position in bed.  Follow  Dorris Carnes MD

## 2019-06-18 NOTE — Progress Notes (Signed)
  Echocardiogram 2D Echocardiogram has been performed.  Mary Ferguson 06/18/2019, 5:18 PM

## 2019-06-18 NOTE — Progress Notes (Signed)
[facility] for Heparin Indication: chest pain/ACS  Allergies  Allergen Reactions  . Rofecoxib Anaphylaxis    VIOXX  . Tetracyclines & Related Nausea And Vomiting  . Benadryl Allergy [Diphenhydramine Hcl] Other (See Comments)    Depressed respirations  . Cephalexin Rash  . Gatifloxacin Other (See Comments)    Confusion Very low blood pressure    Patient Measurements: Height: 5\' 3"  (160 cm) Weight: 257 lb 6.4 oz (116.8 kg) IBW/kg (Calculated) : 52.4 Heparin Dosing Weight: 81 kg  Vital Signs: Temp: 97.9 F (36.6 C) (10/23 0452) Temp Source: Oral (10/23 0452) BP: 135/67 (10/23 0320) Pulse Rate: 62 (10/23 0452)  Labs: Recent Labs    06/17/19 1721 06/17/19 1921 06/18/19 0348  HGB 14.1  --  13.5  HCT 45.6  --  43.3  PLT 313  --  300  APTT  --   --  47*  HEPARINUNFRC  --   --  0.39  CREATININE 1.04*  --  1.01*  TROPONINIHS 213* 1,196*  --     Estimated Creatinine Clearance: 66.7 mL/min (A) (by C-G formula based on SCr of 1.01 mg/dL (H)).  Assessment: 68 y.o. female admitted with chest pain for heparin.   Goal of Therapy:  Heparin level 0.3-0.7 units/ml aPTT 66-102 seconds Monitor platelets by anticoagulation protocol: Yes   Plan:  Increase Heparin 1350 units/hr Follow up after cath today   Phillis Knack, PharmD, BCPS   06/18/2019, 4:58 AM

## 2019-06-18 NOTE — Progress Notes (Addendum)
Progress Note  Patient Name: Mary Ferguson Date of Encounter: 06/18/2019  Primary Cardiologist: Thurmon Fair, MD  Subjective   Asked by cath lab to see patient ASAP. Patient complaining of 10/10 chest pain, feels like someone is pressing a tennis shoe heel specifically into the center of her chest. Very loquacious in conversation, requires redirection back to acute issue at hand. Of note she states she lives in chronic pain and is fearful of receiving too much sedation for cath because she previously did not wake up for 3 weeks once after a procedure. Reports over 30 hernia surgeries.   Inpatient Medications    Scheduled Meds: . aspirin  81 mg Oral Pre-Cath  . aspirin EC  81 mg Oral Daily  . atorvastatin  80 mg Oral q1800   Continuous Infusions: . sodium chloride     Followed by  . sodium chloride    . heparin 1,350 Units/hr (06/18/19 0505)  . nitroGLYCERIN 15 mcg/min (06/18/19 0323)   PRN Meds: acetaminophen, nitroGLYCERIN, ondansetron (ZOFRAN) IV   Vital Signs    Vitals:   06/17/19 2356 06/18/19 0000 06/18/19 0320 06/18/19 0452  BP: 130/62 138/66 135/67   Pulse: (!) 59 60 62 62  Resp: 19 17 19 15   Temp: 98.2 F (36.8 C)  98.7 F (37.1 C) 97.9 F (36.6 C)  TempSrc: Oral  Oral Oral  SpO2: 97% 98% 92% 99%  Weight:      Height:        Intake/Output Summary (Last 24 hours) at 06/18/2019 0803 Last data filed at 06/18/2019 0323 Gross per 24 hour  Intake 1107.64 ml  Output -  Net 1107.64 ml   Last 3 Weights 06/17/2019 06/17/2019 09/29/2018  Weight (lbs) 257 lb 6.4 oz 259 lb 248 lb  Weight (kg) 116.756 kg 117.482 kg 112.492 kg     Telemetry    NSR - Personally Reviewed  ECG    NSR 71bpm, mild inferior ST elevation III, avF, no acute reciprocal changes - nonspecific STT changes otherwise - Personally Reviewed  Physical Exam   GEN: No acute distress, morbidly obese  HEENT: Normocephalic, atraumatic, sclera non-icteric. Neck: No JVD or bruits. Cardiac:  RRR no murmurs, rubs, or gallops.  Radials/DP/PT 1+ and equal bilaterally.  Respiratory: Clear to auscultation bilaterally. Breathing is unlabored. GI: Soft, nontender, non-distended, BS +x 4. MS: no deformity. Extremities: No clubbing or cyanosis. No edema. Distal pedal pulses are 2+ and equal bilaterally. Neuro:  AAOx3. Follows commands. Psych:  Responds to questions appropriately with a normal affect, tangential  Labs    High Sensitivity Troponin:   Recent Labs  Lab 06/17/19 1721 06/17/19 1921  TROPONINIHS 213* 1,196*      Cardiac EnzymesNo results for input(s): TROPONINI in the last 168 hours. No results for input(s): TROPIPOC in the last 168 hours.   Chemistry Recent Labs  Lab 06/17/19 1721 06/18/19 0348  NA 139 139  K 4.0 3.9  CL 106 107  CO2 21* 21*  GLUCOSE 156* 147*  BUN 12 11  CREATININE 1.04* 1.01*  CALCIUM 9.3 9.2  GFRNONAA 56* 58*  GFRAA >60 >60  ANIONGAP 12 11     Hematology Recent Labs  Lab 06/17/19 1721 06/18/19 0348  WBC 9.4 7.9  RBC 5.27* 5.09  HGB 14.1 13.5  HCT 45.6 43.3  MCV 86.5 85.1  MCH 26.8 26.5  MCHC 30.9 31.2  RDW 17.1* 16.8*  PLT 313 300    BNP Recent Labs  Lab 06/17/19 1721  BNP  172.0*     DDimer No results for input(s): DDIMER in the last 168 hours.   Radiology    Dg Chest Portable 1 View  Result Date: 06/17/2019 CLINICAL DATA:  Chest pain EXAM: PORTABLE CHEST 1 VIEW COMPARISON:  None. FINDINGS: Low lung volumes. Streaky basilar atelectasis or scar. Cardiomegaly. No pneumothorax. IMPRESSION: 1. Low lung volumes with streaky basilar atelectasis or scar 2. Mild cardiomegaly Electronically Signed   By: Donavan Foil M.D.   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 Pain was pleuritic in nature, but troponin elevated to 1196.  Assessment & Plan    1. Elevated troponin felt suspicious for NSTEMI vs myopericarditis following viral infection last month - EKG and troponin are concerning for inferior MI either in RCA or possibly Cx given lack of significant reciprocal changes. She does have subtle ST sagging in I, avL. She was on a NTG gtt at 69mcg/min when I arrived. Gave her 1 additional SL NTG since BP was 150's. Recommend expediting catheterization to evaluate anatomy given ongoing chest pain. Risks and benefits of cardiac catheterization have been discussed with the patient.  These include bleeding, infection, kidney damage, stroke, heart attack, death.  The patient understands these risks and is willing to proceed. Orders written per cath lab staff. Discussed with nurse at bedside. Spoke with cath lab to expedite ASAP. I also relayed to Rennis Harding patient's concern about sedation so she can relay to the physician they coordinate to do procedure. She was worried we were going to fully put her to sleep but I discussed standard is to start conservative and dose additionally only as needed. Await echocardiogram as well. She is not tachycardic, tachypneic or hypoxic but if cath unrevealing, need to think about PE in the differential. Last dose of Xarelto was the day before yesterday per her report.  2. Essential HTN - not on any meds prior to admission, BP 110-150s this admission. Follow and adjust meds as needed based on cardiac eval.  3. Hyperglycemia - A1C right at 6.5. Will need to see PCP for probable new dx of DM. Did not acutely discuss with the patient today given need to expedite intervention, difficult to redirect conversation at times. Would be better to have discussion when less acute. Tentatively add CBG checks and PRN SSI.  4. Mild hyperlipidemia - atorvastatin  titrated empirically from 10 -> 80mg  this admission. Will add baseline LFTs to labs. If the patient is tolerating statin at time of follow-up appointment, would consider rechecking liver function/lipid panel in 6-8 weeks.  5. Morbid obesity - conversations surrounding lifestyle modification will be important once cleared for exercise based on cardiac workup. Unfortunately she deals with chronic pain issues. Consider OP referral to [facility].  6. CKD stage III - creatinine appears at baseline.  7. H/o DVT - on Xarelto PTA, last dose 10/21 per her report. Currently on heparin per pharmacy.  For questions or updates, please contact [facility] Please consult [email or web address] for contact info under Cardiology/STEMI.  Signed, Charlie Pitter, PA-C 06/18/2019, 8:03 AM    Pt seen and examined   I agree  with findings as noted by D Dunn above Pt with continued CP overnight   Now "10/10"    Has many pains throughout her botdy but very distressed ON exam, Obese 68 yo in NAD Lungs are CTA  Chest   Tender to palpation but not the pain she is having Cardiac RRR  No S3 Abd Obese Ext are without edema  Plan for cath today to eval anatomy  Risks/benefits described  Pt understands and agrees to proceed.   Dietrich Patesaula  06/18/2019

## 2019-06-18 NOTE — Interval H&P Note (Signed)
Cath Lab Visit (complete for each Cath Lab visit)  Clinical Evaluation Leading to the Procedure:   ACS: Yes.    Non-ACS:  n/a   History and Physical Interval Note:  06/18/2019 9:14 AM  Mary Ferguson  has presented today for surgery, with the diagnosis of NSTEMI.  The various methods of treatment have been discussed with the patient and family. After consideration of risks, benefits and other options for treatment, the patient has consented to  Procedure(s): LEFT HEART CATH AND CORONARY ANGIOGRAPHY (N/A) as a surgical intervention.  The patient's history has been reviewed, patient examined, no change in status, stable for surgery.  I have reviewed the patient's chart and labs.  Questions were answered to the patient's satisfaction.     Kathlyn [place]

## 2019-06-19 ENCOUNTER — Inpatient Hospital Stay (HOSPITAL_COMMUNITY): Payer: Medicare Other

## 2019-06-19 ENCOUNTER — Encounter (HOSPITAL_COMMUNITY): Payer: Self-pay | Admitting: Internal Medicine

## 2019-06-19 DIAGNOSIS — I214 Non-ST elevation (NSTEMI) myocardial infarction: Secondary | ICD-10-CM | POA: Diagnosis not present

## 2019-06-19 DIAGNOSIS — I10 Essential (primary) hypertension: Secondary | ICD-10-CM

## 2019-06-19 DIAGNOSIS — G8929 Other chronic pain: Secondary | ICD-10-CM | POA: Diagnosis present

## 2019-06-19 DIAGNOSIS — I2583 Coronary atherosclerosis due to lipid rich plaque: Secondary | ICD-10-CM

## 2019-06-19 DIAGNOSIS — R1010 Upper abdominal pain, unspecified: Secondary | ICD-10-CM

## 2019-06-19 DIAGNOSIS — E7800 Pure hypercholesterolemia, unspecified: Secondary | ICD-10-CM

## 2019-06-19 DIAGNOSIS — N1831 Chronic kidney disease, stage 3a: Secondary | ICD-10-CM

## 2019-06-19 DIAGNOSIS — I82409 Acute embolism and thrombosis of unspecified deep veins of unspecified lower extremity: Secondary | ICD-10-CM | POA: Diagnosis present

## 2019-06-19 DIAGNOSIS — R1084 Generalized abdominal pain: Secondary | ICD-10-CM | POA: Diagnosis present

## 2019-06-19 LAB — CBC
HCT: 42.9 % (ref 36.0–46.0)
Hemoglobin: 13.3 g/dL (ref 12.0–15.0)
MCH: 26.7 pg (ref 26.0–34.0)
MCHC: 31 g/dL (ref 30.0–36.0)
MCV: 86 fL (ref 80.0–100.0)
Platelets: 254 10*3/uL (ref 150–400)
RBC: 4.99 MIL/uL (ref 3.87–5.11)
RDW: 17.1 % — ABNORMAL HIGH (ref 11.5–15.5)
WBC: 7.7 10*3/uL (ref 4.0–10.5)
nRBC: 0 % (ref 0.0–0.2)

## 2019-06-19 LAB — GLUCOSE, CAPILLARY
Glucose-Capillary: 107 mg/dL — ABNORMAL HIGH (ref 70–99)
Glucose-Capillary: 105 mg/dL — ABNORMAL HIGH (ref 70–99)
Glucose-Capillary: 102 mg/dL — ABNORMAL HIGH (ref 70–99)
Glucose-Capillary: 111 mg/dL — ABNORMAL HIGH (ref 70–99)

## 2019-06-19 LAB — BASIC METABOLIC PANEL
Anion gap: 10 (ref 5–15)
BUN: 8 mg/dL (ref 8–23)
CO2: 22 mmol/L (ref 22–32)
Calcium: 9.4 mg/dL (ref 8.9–10.3)
Chloride: 107 mmol/L (ref 98–111)
Creatinine, Ser: 1.03 mg/dL — ABNORMAL HIGH (ref 0.44–1.00)
GFR calc Af Amer: >60 mL/min (ref >60)
GFR calc non Af Amer: 56 mL/min — ABNORMAL LOW (ref >60)
Glucose, Bld: 124 mg/dL — ABNORMAL HIGH (ref 70–99)
Potassium: 3.8 mmol/L (ref 3.5–5.1)
Sodium: 139 mmol/L (ref 135–145)

## 2019-06-19 LAB — TROPONIN I (HIGH SENSITIVITY): Troponin I (High Sensitivity): 12617 ng/L (ref <18)

## 2019-06-19 LAB — ECHOCARDIOGRAM COMPLETE
Height: 63 in
Weight: 4118.4 oz

## 2019-06-19 LAB — APTT: aPTT: 26 seconds (ref 24–36)

## 2019-06-19 MED ORDER — RIVAROXABAN 10 MG PO TABS
10.0000 mg | ORAL_TABLET | Freq: Every day | ORAL | Status: DC
  Administered 2019-06-19 – 2019-06-22 (×4): 10 mg via ORAL
  Filled 2019-06-19 (×4): qty 1

## 2019-06-19 MED ORDER — RIVAROXABAN 20 MG PO TABS: 20.0000 mg | ORAL_TABLET | Freq: Every day | ORAL | Status: DC

## 2019-06-19 NOTE — Progress Notes (Signed)
CARDIAC REHAB PHASE I   Pt did not ambulated. Pt stated she has not been walking in hall due to personal reasons but has been ambulating in room. Pt was educated on risk factors, exercise guidelines, MI booklet, NTG use, Plavix, stent procedure, nutrition, restrictions, and CRP II. Pt referred to CRP II [place].   [id number]  [place], ACSM CEP 06/19/2019  11:25 AM

## 2019-06-19 NOTE — Evaluation (Signed)
Physical Therapy Evaluation Patient Details Name: Mary Ferguson MRN: [id number] DOB: November 28, 1950 Today's Date: 06/19/2019   History of Present Illness  Mary Ferguson is an 68 y.o. female with PMH significant for morbid obesity (BMI 45.67); diabetest mellitus type 2; recurrent DVT on Xarelto (previously non-compliant with Coumadin); HTN; stage 3 CKD; chronic pain; asthma; and dyslipidemia who presented with NSTEMI on 10/22. mRCA occluded s/p PCI.   Clinical Impression  Pt admitted with above diagnosis. Pt presents with decreased functional mobility secondary to generalized weakness, chronic abdominal and back pain, balance impairments, postural abnormalities, decreased cardiovascular endurance. On PT evaluation, pt ambulating 75 feet with walker at a min guard assist level. HR peak 98 bpm, SpO2 93-96% on RA. Pt tearful at times, but states she would really like to pursue outpatient cardiac rehab services post discharge. Pt currently with functional limitations due to the deficits listed below (see PT Problem List). Pt will benefit from skilled PT to increase their independence and safety with mobility to allow discharge to the venue listed below.       Follow Up Recommendations Supervision for mobility/OOB;Other (comment) (CRP; pt declining SNF or HH)    Equipment Recommendations  None recommended by PT    Recommendations for Other Services OT consult     Precautions / Restrictions Precautions Precautions: Fall Restrictions Weight Bearing Restrictions: No      Mobility  Bed Mobility Overal bed mobility: Needs Assistance Bed Mobility: Supine to Sit;Sit to Supine     Supine to sit: Supervision Sit to supine: Supervision   General bed mobility comments: increased time, effort  Transfers Overall transfer level: Needs assistance Equipment used: Rolling walker (2 wheeled) Transfers: Sit to/from Stand Sit to Stand: Min guard            Ambulation/Gait Ambulation/Gait assistance:  Min guard Gait Distance (Feet): 75 Feet Assistive device: Rolling walker (2 wheeled) Gait Pattern/deviations: Step-through pattern;Decreased stride length;Trunk flexed Gait velocity: decreased Gait velocity interpretation: <1.31 ft/sec, indicative of household ambulator General Gait Details: Slow pace, one episode of mild posterior LOB but able to self correct. At times, tends to reach for external support outside of walker but instructions provided to keep hands on walker during ambulation  Stairs            Wheelchair Mobility    Modified Rankin (Stroke Patients Only)       Balance Overall balance assessment: Needs assistance Sitting-balance support: Feet supported Sitting balance-Leahy Scale: Good     Standing balance support: Bilateral upper extremity supported Standing balance-Leahy Scale: Poor Standing balance comment: reliant on external support                             Pertinent Vitals/Pain Pain Assessment: Faces Faces Pain Scale: Hurts even more Pain Location: chronic back, abdominal pain Pain Descriptors / Indicators: Grimacing Pain Intervention(s): Monitored during session    Home Living Family/patient expects to be discharged to:: Private residence Living Arrangements: Children(son, daughter in Social worker) Available Help at Discharge: Family Type of Home: House Home Access: Stairs to enter     Home Layout: One level Home Equipment: Environmental consultant - 2 wheels;Walker - 4 wheels;Tub bench      Prior Function Level of Independence: Needs assistance   Gait / Transfers Assistance Needed: uses walker for household distances  ADL's / Homemaking Assistance Needed: requires assist with IADL's        Hand Dominance  Extremity/Trunk Assessment   Upper Extremity Assessment Upper Extremity Assessment: Generalized weakness    Lower Extremity Assessment Lower Extremity Assessment: Generalized weakness       Communication   Communication:  No difficulties  Cognition Arousal/Alertness: Awake/alert Behavior During Therapy: WFL for tasks assessed/performed Overall Cognitive Status: Within Functional Limits for tasks assessed                                 General Comments: WFL for basic mobility tasks, often emotionally labile during session, benefits from positive reinforcement      General Comments      Exercises     Assessment/Plan    PT Assessment Patient needs continued PT services  PT Problem List Decreased strength;Decreased activity tolerance;Decreased balance;Decreased mobility;Pain;Cardiopulmonary status limiting activity       PT Treatment Interventions DME instruction;Gait training;Therapeutic activities;Stair training;Functional mobility training;Therapeutic exercise;Balance training;Patient/family education    PT Goals (Current goals can be found in the Care Plan section)  Acute Rehab PT Goals Patient Stated Goal: "participate in the cardiac rehab program." PT Goal Formulation: With patient Time For Goal Achievement: 07/03/19 Potential to Achieve Goals: Fair    Frequency Min 3X/week   Barriers to discharge        Co-evaluation               AM-PAC PT "6 Clicks" Mobility  Outcome Measure Help needed turning from your back to your side while in a flat bed without using bedrails?: None Help needed moving from lying on your back to sitting on the side of a flat bed without using bedrails?: None Help needed moving to and from a bed to a chair (including a wheelchair)?: A Little Help needed standing up from a chair using your arms (e.g., wheelchair or bedside chair)?: A Little Help needed to walk in hospital room?: A Little Help needed climbing 3-5 steps with a railing? : A Lot 6 Click Score: 19    End of Session   Activity Tolerance: Patient tolerated treatment well Patient left: with call bell/phone within reach;in bed;with family/visitor present   PT Visit Diagnosis:  Unsteadiness on feet (R26.81);Muscle weakness (generalized) (M62.81);Difficulty in walking, not elsewhere classified (R26.2);Pain Pain - part of body: (back, abdomen)    Time: 9924-2683 PT Time Calculation (min) (ACUTE ONLY): 39 min   Charges:   PT Evaluation $PT Eval Moderate Complexity: 1 Mod PT Treatments $Gait Training: 8-22 mins $Therapeutic Activity: 8-22 mins        Ellamae Sia, PT, DPT Acute Rehabilitation Services Pager [phone] Office [phone]   Willy Eddy 06/19/2019, 4:24 PM

## 2019-06-19 NOTE — Consult Note (Signed)
Medical Consultation   Mary MohLyle Vandevander  ZOX:[id number]  DOB: 1950-11-05  DOA: 06/17/2019  PCP: Merlene LaughterStoneking, Hal, MD   Outpatient Specialists: Threasa BeardsArne- podiatry; [facility] - orthopedics; [facility]- GI   Requesting physician: Mayford Knifeurner - cardiology  Reason for consultation: Chronic abdominal pain.  Admitted with CP and had a stent placed.  Much worsening abdominal pain during hospitalization.  For PT evaluation today, may need rehab.   History of Present Illness: Mary Ferguson is an 68 y.o. female with PMH significant for morbid obesity (BMI 45.67); recurrent DVT on Xarelto (previously non-compliant with Coumadin); HTN; stage 3 CKD; chronic pain; asthma; and dyslipidemia who presented with NSTEMI on 10/22.  She was taken to the cath lab yesterday and will be treated medically.  She reports chronic pain and h/o multiple surgeries.  She is concerned about worsening pain and requests abdominal and back scans.  She is a recently diagnosed diabetic.  Her entire abdomen hurts.  She can't stand for anything to touch it.  She removed herself from a pain clinic because she doesn't want drugs.  She hurts all the time and can't even ride in the car because of the pain.  Her last BM was "probably yesterday" but maybe not a normal one.  She has chronic diarrhea.  Yesterday's BM was mucus with a clump of brown stool.  Chronic nausea since she woke up with chest pin but also has chronic nausea.  She lacks sense of taste and feels like the back of her jaw waters constantly.  She was last seen by GI on 11/05/17.  She has diagnoses of: bowel/bladder incontinence; chronic abdominal pain; chronic diarrhea; chronic pain; diverticular disease; hiatal hernia.  She was ordered for labs and KUB and told to take/continue Lomotil, Bentyl, probiotics.  Possible etiologies were thought to be IBS, SIBO, or functional diarrhea.  Normal KUB in 12/2017.  Review of Systems:  ROS As per HPI otherwise 10 point review of systems  negative.    Past Medical History: Past Medical History:  Diagnosis Date  . Asthma   . Chronic pain   . CKD (chronic kidney disease), stage III   . Hypertension   . Morbid obesity (HCC)   . Recurrent deep vein thrombosis (DVT) (HCC)     Past Surgical History: Past Surgical History:  Procedure Laterality Date  . CORONARY STENT INTERVENTION N/A 06/18/2019   Procedure: CORONARY STENT INTERVENTION;  Surgeon: Iran OuchArida, Muhammad A, MD;  Location: [facility]ASIVE [facility];  Service: Cardiovascular;  Laterality: N/A;  . EYE SURGERY    . HERNIA REPAIR    . LEFT HEART CATH AND CORONARY ANGIOGRAPHY N/A 06/18/2019   Procedure: LEFT HEART CATH AND CORONARY ANGIOGRAPHY;  Surgeon: Iran OuchArida, Muhammad A, MD;  Location: [facility] INVASIVE CV LAB;  Service: Cardiovascular;  Laterality: N/A;  . VEIN SURGERY       Allergies:   Allergies  Allergen Reactions  . Rofecoxib Anaphylaxis    VIOXX  . Tetracyclines & Related Nausea And Vomiting  . Benadryl Allergy [Diphenhydramine Hcl] Other (See Comments)    Depressed respirations  . Cephalexin Rash  . Gatifloxacin Other (See Comments)    Confusion Very low blood pressure     Social History:  reports that she has quit smoking. She has never used smokeless tobacco. She reports that she does not drink alcohol or use drugs.   Family History: No family history on file.    Physical Exam:  Vitals:   06/18/19 1800 06/18/19 2030 06/19/19 0402 06/19/19 0900  BP:  134/72 127/69 129/80  Pulse: 60 71 78   Resp:  15 16 17   Temp:  98.1 F (36.7 C) 98.9 F (37.2 C)   TempSrc:  Oral Oral   SpO2:  96% 93%   Weight:   116.9 kg   Height:        Constitutional: Alert and awake, oriented x3, not in any acute distress. Eyes: EOMI, irises appear normal, anicteric sclera,  ENMT: external ears and nose appear normal, normal hearing, Lips appear normal Neck: neck appears normal, no masses, normal ROM  CVS: S1-S2 clear, no murmur rubs or gallops, no LE edema, normal  pedal pulses  Respiratory:  clear to auscultation bilaterally, no wheezing, rales or rhonchi. Respiratory effort normal. No accessory muscle use.  Abdomen: soft nontender, nondistended, normal bowel sounds.  Despite her complaint of exquisite TTP, I was able to palpate her abdomen while distracting her without obvious areas of severe TTP.  She does have an incisional/ventral hernia that is reproducible. Musculoskeletal: : no cyanosis, clubbing or edema noted bilaterally Neuro: Cranial nerves II-XII intact, strength, sensation, reflexes Psych: judgement and insight appear normal, stable mood and affect, mental status Skin: no rashes or lesions or ulcers, no induration or nodules    Data reviewed:  I have personally reviewed the recent labs and imaging studies  Pertinent Labs:   Glucose 124 BUN 8/Creatinine 1.03/GFR 56 HS troponin 12,617(!) Unremarkable CBC   Inpatient Medications:   Scheduled Meds: . atorvastatin  80 mg Oral q1800  . carvedilol  3.125 mg Oral BID WC  . clobetasol ointment  1 application Topical BID  . clopidogrel  75 mg Oral Q breakfast  . insulin aspart  0-9 Units Subcutaneous TID WC  . metroNIDAZOLE   Topical BID  . psyllium  1 packet Oral Daily  . rivaroxaban  10 mg Oral Daily  . sodium chloride flush  3 mL Intravenous Q12H   Continuous Infusions: . sodium chloride       Radiological Exams on Admission: Dg Abd 1 View  Result Date: 06/19/2019 CLINICAL DATA:  Abdominal pain EXAM: ABDOMEN - 1 VIEW COMPARISON:  None. FINDINGS: The bowel gas pattern is normal. Air-fluid levels cannot be evaluated on this supine exam. Surgical clips overlie the right upper quadrant. No radio-opaque calculi are seen. Degenerative changes are seen in the hips and spine. IMPRESSION: Nonobstructive bowel gas pattern. Electronically Signed   By: Zerita Boers M.D.   On: 06/19/2019 12:34   Dg Chest Portable 1 View  Result Date: 06/17/2019 CLINICAL DATA:  Chest pain EXAM: PORTABLE  CHEST 1 VIEW COMPARISON:  None. FINDINGS: Low lung volumes. Streaky basilar atelectasis or scar. Cardiomegaly. No pneumothorax. IMPRESSION: 1. Low lung volumes with streaky basilar atelectasis or scar 2. Mild cardiomegaly Electronically Signed   By: Donavan Foil M.D.   On: 06/17/2019 18:11    Impression/Recommendations Principal Problem:   NSTEMI (non-ST elevated myocardial infarction) ([place]) Active Problems:   Recurrent deep vein thrombosis (DVT) (HCC)   Morbid obesity (HCC)   Hypertension   CKD (chronic kidney disease), stage III   Chronic pain   Chronic generalized abdominal pain  NSTEMI -Patient presenting with CP -Cardiac cath with 100% RCA stenosis with successful placement of DES -Continue Lipitor at increased dose -Started on Plavix -Management per cardiology  Acute on chronic abdominal pain -Patient reports intermittent severe pain, but did not appear to have significant pain during my evaluation -  She has a long-standing h/o abdominal pain as well as multiple surgeries -She may have a transient partial SBO and may benefit from liquid diet when pain is severe -However, imaging does not show an obstructive pattern currently -Suggest good bowel regimen -She does have a large abdominal wall hernia but is unlikely to be a good surgical candidate for this - particularly now with NSTEMI and stent placement -Would attempt to avoid narcotic pain medications, if possible  Morbid obesity -BMI 46 -Weight loss should be encouraged -Outpatient PCP/bariatric medicine f/u encouraged  HTN -She does not appear to be taking medications for this issue  Hyperglycemia -A1c is 6.5 -She has been started on SSI -She may benefit from Invokana or Metformin as an outpatient, as these may also help with weight loss  Stage 3a CKD -Appears to be stable at this time  Recurrent DVT -Continue Xarelto, needs lifelong AC   Thank you for this consultation.  Our [facility] hospitalist team will sign  off at this time, as her medical problems appear to be stable.   Time Spent: 50 minutes  Jonah Blue M.D. [facility] 06/19/2019, 1:58 PM

## 2019-06-19 NOTE — Plan of Care (Signed)
Patient remains free from chest pain, still complains of abd pain and discomfort with very poor appetite.  Reports BM 10/24 that had mucus in it.  Phenergan given earlier with good results.  Will continue to monitor.

## 2019-06-19 NOTE — Progress Notes (Addendum)
Progress Note  Patient Name: Mary Ferguson Date of Encounter: 06/19/2019  Primary Cardiologist: Thurmon Fair, MD  Subjective   Cath yesterday showed significant 2v CAD with occluded mRCA which is likely culprit lesion for MI and 70% mid LAD that appeared chronic s/p PCI of the mRCA.   Had her chronic abdominal pain yesterday and persists today.  SHe tells me her abdominal is worse than what it usually is.  Also wants to go to rehab when she leaves here.  Inpatient Medications    Scheduled Meds: . aspirin EC  81 mg Oral Daily  . atorvastatin  80 mg Oral q1800  . carvedilol  3.125 mg Oral BID WC  . clobetasol ointment  1 application Topical BID  . clopidogrel  75 mg Oral Q breakfast  . insulin aspart  0-9 Units Subcutaneous TID WC  . metroNIDAZOLE   Topical BID  . psyllium  1 packet Oral Daily  . sodium chloride flush  3 mL Intravenous Q12H   Continuous Infusions: . sodium chloride     PRN Meds: sodium chloride, acetaminophen, nitroGLYCERIN, ondansetron (ZOFRAN) IV, promethazine, sodium chloride flush   Vital Signs    Vitals:   06/18/19 1643 06/18/19 1800 06/18/19 2030 06/19/19 0402  BP: (!) 120/58  134/72 127/69  Pulse: 62 60 71 78  Resp: 20  15 16   Temp:   98.1 F (36.7 C) 98.9 F (37.2 C)  TempSrc:   Oral Oral  SpO2: 91%  96% 93%  Weight:    116.9 kg  Height:        Intake/Output Summary (Last 24 hours) at 06/19/2019 0845 Last data filed at 06/18/2019 1355 Gross per 24 hour  Intake 360 ml  Output -  Net 360 ml   Last 3 Weights 06/19/2019 06/17/2019 06/17/2019  Weight (lbs) 257 lb 12.8 oz 257 lb 6.4 oz 259 lb  Weight (kg) 116.937 kg 116.756 kg 117.482 kg     Telemetry    NSR - Personally Reviewed  ECG    NSR with inferior T wave abnormality - Personally Reviewed  Physical Exam   GEN: Well nourished, well developed in no acute distress HEENT: Normal NECK: No JVD; No carotid bruits LYMPHATICS: No lymphadenopathy CARDIAC:RRR, no murmurs, rubs,  gallops RESPIRATORY:  Clear to auscultation without rales, wheezing or rhonchi  ABDOMEN: Soft, non-tender, non-distended MUSCULOSKELETAL:  No edema; No deformity  SKIN: Warm and dry.  Right radial cath site clean and dry with intact radial pulse NEUROLOGIC:  Alert and oriented x 3 PSYCHIATRIC:  Normal affect    Labs    High Sensitivity Troponin:   Recent Labs  Lab 06/17/19 1721 06/17/19 1921 06/19/19 0351  TROPONINIHS 213* 1,196* 12,617*      Cardiac EnzymesNo results for input(s): TROPONINI in the last 168 hours. No results for input(s): TROPIPOC in the last 168 hours.   Chemistry Recent Labs  Lab 06/17/19 1721 06/18/19 0348 06/19/19 0351  NA 139 139 139  K 4.0 3.9 3.8  CL 106 107 107  CO2 21* 21* 22  GLUCOSE 156* 147* 124*  BUN 12 11 8   CREATININE 1.04* 1.01* 1.03*  CALCIUM 9.3 9.2 9.4  PROT  --  6.5  --   ALBUMIN  --  3.3*  --   AST  --  60*  --   ALT  --  19  --   ALKPHOS  --  87  --   BILITOT  --  0.8  --   GFRNONAA 56*  58* 56*  GFRAA >60 >60 >60  ANIONGAP 12 11 10      Hematology Recent Labs  Lab 06/17/19 1721 06/18/19 0348 06/19/19 0351  WBC 9.4 7.9 7.7  RBC 5.27* 5.09 4.99  HGB 14.1 13.5 13.3  HCT 45.6 43.3 42.9  MCV 86.5 85.1 86.0  MCH 26.8 26.5 26.7  MCHC 30.9 31.2 31.0  RDW 17.1* 16.8* 17.1*  PLT 313 300 254    BNP Recent Labs  Lab 06/17/19 1721  BNP 172.0*     DDimer No results for input(s): DDIMER in the last 168 hours.   Radiology    Dg Chest Portable 1 View  Result Date: 06/17/2019 CLINICAL DATA:  Chest pain EXAM: PORTABLE CHEST 1 VIEW COMPARISON:  None. FINDINGS: Low lung volumes. Streaky basilar atelectasis or scar. Cardiomegaly. No pneumothorax. IMPRESSION: 1. Low lung volumes with streaky basilar atelectasis or scar 2. Mild cardiomegaly Electronically Signed   By: Donavan Foil M.D.   On: 06/17/2019 18:11   Cardiac Studies   None  Patient Profile     68 y.o. female with recurrent DVT on Xarelto prior to admission  (last event 09/2018), HTN, CKD stage III by GFR, chronic pain, asthma, recent viral illness in September 2020 with anosmia/diarrhea/weakness (not tested for Covid at that time) admitted with chest pain amongst other positive ROS in context of chronic pain. Pain was pleuritic in nature, but troponin elevated to 1196.  Assessment & Plan    1. NSTEMI -cath with occluded mRCA s/p PCI -also with 70% mLAD treated medically -continue high dose statin, BB and Plavix -per cath note will stop ASA and keep on Plavix and DOAC since she is on Xarelto for recurrent DVT -unclear if she is going to stay on Xarelto life long so if PCP stops Xarelto then needs to start back on ASA.  2. Essential HTN  -BP stable -has not required antihypertensive Rx in the past -now on low dose Carvedilol  3. Hyperglycemia  - A1C right at 6.5.  -already been seen by PCP for DM and wants to try diet first with PT to help increase ambulation  4. Mild hyperlipidemia  -LDL goal < 70 and LDL this admit 104 -atorvastatin increased to 80mg  daily -will need FLP and ALT in 6 weeks  5. Morbid obesity  - conversations surrounding lifestyle modification will be important once cleared for exercise based on cardiac workup.  -Unfortunately she deals with chronic pain issues.  -Consider OP referral to [facility]. -for now get PT consult to see if she would benefit from rehab at discharge  6. CKD stage III  -stable post cath at 1.03  7. H/o DVT   8.  Abdominal pain -She has chronic abdominal pain but much worse this admission -will ask TR[facility] to consult  For questions or updates, please contact [place] Please consult [email or web address] for contact info under Cardiology/STEMI.  Signed, Fransico Him, MD 06/19/2019, 8:45 AM

## 2019-06-20 LAB — BASIC METABOLIC PANEL
Anion gap: 11 (ref 5–15)
BUN: 10 mg/dL (ref 8–23)
CO2: 24 mmol/L (ref 22–32)
Calcium: 9.4 mg/dL (ref 8.9–10.3)
Chloride: 104 mmol/L (ref 98–111)
Creatinine, Ser: 1.12 mg/dL — ABNORMAL HIGH (ref 0.44–1.00)
GFR calc Af Amer: 59 mL/min — ABNORMAL LOW (ref >60)
GFR calc non Af Amer: 51 mL/min — ABNORMAL LOW (ref >60)
Glucose, Bld: 105 mg/dL — ABNORMAL HIGH (ref 70–99)
Potassium: 3.7 mmol/L (ref 3.5–5.1)
Sodium: 139 mmol/L (ref 135–145)

## 2019-06-20 LAB — GLUCOSE, CAPILLARY
Glucose-Capillary: 112 mg/dL — ABNORMAL HIGH (ref 70–99)
Glucose-Capillary: 95 mg/dL (ref 70–99)
Glucose-Capillary: 88 mg/dL (ref 70–99)
Glucose-Capillary: 110 mg/dL — ABNORMAL HIGH (ref 70–99)

## 2019-06-20 LAB — CBC
HCT: 40.2 % (ref 36.0–46.0)
Hemoglobin: 12.6 g/dL (ref 12.0–15.0)
MCH: 26.6 pg (ref 26.0–34.0)
MCHC: 31.3 g/dL (ref 30.0–36.0)
MCV: 85 fL (ref 80.0–100.0)
Platelets: 240 10*3/uL (ref 150–400)
RBC: 4.73 MIL/uL (ref 3.87–5.11)
RDW: 16.5 % — ABNORMAL HIGH (ref 11.5–15.5)
WBC: 7.3 10*3/uL (ref 4.0–10.5)
nRBC: 0 % (ref 0.0–0.2)

## 2019-06-20 LAB — APTT: aPTT: 29 seconds (ref 24–36)

## 2019-06-20 MED ORDER — DICYCLOMINE HCL 10 MG PO CAPS
10.0000 mg | ORAL_CAPSULE | Freq: Three times a day (TID) | ORAL | Status: DC
  Administered 2019-06-20: 10 mg via ORAL
  Filled 2019-06-20 (×2): qty 1

## 2019-06-20 MED ORDER — SIMETHICONE 80 MG PO CHEW
80.0000 mg | CHEWABLE_TABLET | Freq: Four times a day (QID) | ORAL | Status: DC | PRN
  Administered 2019-06-20: 80 mg via ORAL
  Filled 2019-06-20: qty 1

## 2019-06-20 MED ORDER — DICYCLOMINE HCL 10 MG PO CAPS
20.0000 mg | ORAL_CAPSULE | Freq: Three times a day (TID) | ORAL | Status: DC
  Administered 2019-06-20 – 2019-06-22 (×3): 20 mg via ORAL
  Filled 2019-06-20 (×5): qty 2

## 2019-06-20 NOTE — Progress Notes (Addendum)
Progress Note  Patient Name: Mary Ferguson Date of Encounter: 06/20/2019  Primary Cardiologist: Sanda Klein, MD  Subjective   Cath with significant 2v CAD with occluded mRCA which is likely culprit lesion for MI and 70% mid LAD that appeared chronic s/p PCI of the mRCA.  Continues to complain of chronic abdominal pain that she says occurs every day.  Complains she is not getting her Bentyl  Inpatient Medications    Scheduled Meds: . atorvastatin  80 mg Oral q1800  . carvedilol  3.125 mg Oral BID WC  . clobetasol ointment  1 application Topical BID  . clopidogrel  75 mg Oral Q breakfast  . insulin aspart  0-9 Units Subcutaneous TID WC  . metroNIDAZOLE   Topical BID  . psyllium  1 packet Oral Daily  . rivaroxaban  10 mg Oral Daily  . sodium chloride flush  3 mL Intravenous Q12H   Continuous Infusions: . sodium chloride     PRN Meds: sodium chloride, acetaminophen, nitroGLYCERIN, ondansetron (ZOFRAN) IV, promethazine, sodium chloride flush   Vital Signs    Vitals:   06/19/19 0900 06/19/19 2039 06/20/19 0500 06/20/19 0747  BP: 129/80 (!) 117/101 120/62 140/65  Pulse:  66 77 68  Resp: 17 17 13    Temp:  98.4 F (36.9 C) 98.2 F (36.8 C)   TempSrc:  Oral Axillary   SpO2:  91% 93%   Weight:   115.8 kg   Height:        Intake/Output Summary (Last 24 hours) at 06/20/2019 0806 Last data filed at 06/20/2019 0600 Gross per 24 hour  Intake 270 ml  Output -  Net 270 ml   Last 3 Weights 06/20/2019 06/19/2019 06/17/2019  Weight (lbs) 255 lb 4.8 oz 257 lb 12.8 oz 257 lb 6.4 oz  Weight (kg) 115.803 kg 116.937 kg 116.756 kg     Telemetry    NSR- Personally Reviewed  ECG    NSR with inferior ST abnormality- Personally Reviewed  Physical Exam   GEN: Well nourished, well developed in no acute distress HEENT: Normal NECK: No JVD; No carotid bruits LYMPHATICS: No lymphadenopathy CARDIAC:RRR, no murmurs, rubs, gallops RESPIRATORY:  Clear to auscultation without  rales, wheezing or rhonchi  ABDOMEN: Soft, non-tender, non-distended MUSCULOSKELETAL:  No edema; No deformity  SKIN: Warm and dry NEUROLOGIC:  Alert and oriented x 3 PSYCHIATRIC:  Normal affect    Labs    High Sensitivity Troponin:   Recent Labs  Lab 06/17/19 1721 06/17/19 1921 06/19/19 0351  TROPONINIHS 213* 1,196* 12,617*      Cardiac EnzymesNo results for input(s): TROPONINI in the last 168 hours. No results for input(s): TROPIPOC in the last 168 hours.   Chemistry Recent Labs  Lab 06/18/19 0348 06/19/19 0351 06/20/19 0445  NA 139 139 139  K 3.9 3.8 3.7  CL 107 107 104  CO2 21* 22 24  GLUCOSE 147* 124* 105*  BUN 11 8 10   CREATININE 1.01* 1.03* 1.12*  CALCIUM 9.2 9.4 9.4  PROT 6.5  --   --   ALBUMIN 3.3*  --   --   AST 60*  --   --   ALT 19  --   --   ALKPHOS 87  --   --   BILITOT 0.8  --   --   GFRNONAA 58* 56* 51*  GFRAA >60 >60 59*  ANIONGAP 11 10 11      Hematology Recent Labs  Lab 06/18/19 0348 06/19/19 0351 06/20/19 0445  WBC 7.9 7.7 7.3  RBC 5.09 4.99 4.73  HGB 13.5 13.3 12.6  HCT 43.3 42.9 40.2  MCV 85.1 86.0 85.0  MCH 26.5 26.7 26.6  MCHC 31.2 31.0 31.3  RDW 16.8* 17.1* 16.5*  PLT 300 254 240    BNP Recent Labs  Lab 06/17/19 1721  BNP 172.0*     DDimer No results for input(s): DDIMER in the last 168 hours.   Radiology    Dg Abd 1 View  Result Date: 06/19/2019 CLINICAL DATA:  Abdominal pain EXAM: ABDOMEN - 1 VIEW COMPARISON:  None. FINDINGS: The bowel gas pattern is normal. Air-fluid levels cannot be evaluated on this supine exam. Surgical clips overlie the right upper quadrant. No radio-opaque calculi are seen. Degenerative changes are seen in the hips and spine. IMPRESSION: Nonobstructive bowel gas pattern. Electronically Signed   By: Romona Curls M.D.   On: 06/19/2019 12:34   Cardiac Studies   None  Patient Profile     68 y.o. female with recurrent DVT on Xarelto prior to admission (last event 09/2018), HTN, CKD stage  III by GFR, chronic pain, asthma, recent viral illness in September 2020 with anosmia/diarrhea/weakness (not tested for Covid at that time) admitted with chest pain amongst other positive ROS in context of chronic pain. Pain was pleuritic in nature, but troponin elevated to 1196.  Assessment & Plan    1. NSTEMI -cath with occluded mRCA s/p PCI -also with 70% mLAD treated medically -per cath note ASA stopped and will keep on Plavix and DOAC since she is on Xarelto for recurrent DVT -unclear if she is going to stay on Xarelto life long so if PCP stops Xarelto then needs to start back on ASA. -continue Plavix, BB and high dose statin  2. Essential HTN  -BP controlled at 140/74mmHg -now on low dose Carvedilol  3. Hyperglycemia  - A1C right at 6.5.  -already been seen by PCP for DM and wants to try diet first with PT to help increase ambulation  4. Mild hyperlipidemia  -LDL goal < 70 and LDL this admit 104 -atorvastatin increased to 80mg  daily -will need FLP and ALT in 6 weeks  5. Morbid obesity  - conversations surrounding lifestyle modification will be important once cleared for exercise based on cardiac workup.  -Unfortunately she deals with chronic pain issues.  -Consider OP referral to [facility].  6. CKD stage III  -stable post cath at 1.03  7. H/o DVT   8.  Abdominal pain -She has chronic abdominal pain but much worse this admission -appreciated IM consult.  No other recs at this time as this seems stable -avoid narcotis -restart Bentyl  9.  Disposition -weakness and difficulty walking are limiting discharge -PT recommends rehab -will consult inpatient rehab  For questions or updates, please contact [facility] Please consult [email or web address] for contact info under Cardiology/STEMI.  Signed, , MD 06/20/2019, 8:06 AM

## 2019-06-21 DIAGNOSIS — I10 Essential (primary) hypertension: Secondary | ICD-10-CM | POA: Diagnosis not present

## 2019-06-21 DIAGNOSIS — E7800 Pure hypercholesterolemia, unspecified: Secondary | ICD-10-CM | POA: Diagnosis not present

## 2019-06-21 DIAGNOSIS — R1010 Upper abdominal pain, unspecified: Secondary | ICD-10-CM | POA: Diagnosis not present

## 2019-06-21 LAB — CBC
HCT: 40.4 % (ref 36.0–46.0)
Hemoglobin: 12.9 g/dL (ref 12.0–15.0)
MCH: 26.8 pg (ref 26.0–34.0)
MCHC: 31.9 g/dL (ref 30.0–36.0)
MCV: 83.8 fL (ref 80.0–100.0)
Platelets: 258 10*3/uL (ref 150–400)
RBC: 4.82 MIL/uL (ref 3.87–5.11)
RDW: 16.5 % — ABNORMAL HIGH (ref 11.5–15.5)
WBC: 7.9 10*3/uL (ref 4.0–10.5)
nRBC: 0 % (ref 0.0–0.2)

## 2019-06-21 LAB — GLUCOSE, CAPILLARY
Glucose-Capillary: 92 mg/dL (ref 70–99)
Glucose-Capillary: 97 mg/dL (ref 70–99)
Glucose-Capillary: 108 mg/dL — ABNORMAL HIGH (ref 70–99)
Glucose-Capillary: 147 mg/dL — ABNORMAL HIGH (ref 70–99)

## 2019-06-21 LAB — APTT: aPTT: 29 seconds (ref 24–36)

## 2019-06-21 NOTE — Progress Notes (Signed)
Patient stated that she will take her Bentyl when she really needs it for stomach cramping/pain.  Currently it is a scheduled dose tid.

## 2019-06-21 NOTE — NC FL2 (Signed)
[place] MEDICAID FL2 LEVEL OF CARE SCREENING TOOL     IDENTIFICATION  Patient Name: Mary Ferguson Birthdate: 1951/03/11 Sex: female Admission Date (Current Location): 06/17/2019  [facility] and [place] Number:  Producer, television/film/video and Address:         Provider Number: [id number]  Attending Physician Name and Address:  Thurmon Fair, MD  Relative Name and Phone Number:  Dyane Dustman- son- [phone]    Current Level of Care: Hospital Recommended Level of Care: Skilled Nursing Facility Prior Approval Number:    Date Approved/Denied:   PASRR Number: [id number]  Discharge Plan: SNF    Current Diagnoses: Patient Active Problem List   Diagnosis Date Noted  . Recurrent deep vein thrombosis (DVT) (HCC)   . Morbid obesity (HCC)   . Hypertension   . CKD (chronic kidney disease), stage III   . Chronic pain   . Chronic generalized abdominal pain   . NSTEMI (non-ST elevated myocardial infarction) (HCC) 06/17/2019  . DVT (deep venous thrombosis) (HCC) 09/29/2018    Orientation RESPIRATION BLADDER Height & Weight     Self, Time, Situation, Place  Normal Continent Weight: 114.6 kg Height:  5\' 3"  (160 cm)  BEHAVIORAL SYMPTOMS/MOOD NEUROLOGICAL BOWEL NUTRITION STATUS    (none) Continent Diet(Heart Healthy -Room Service)  AMBULATORY STATUS COMMUNICATION OF NEEDS Skin   Extensive Assist Verbally Normal                       Personal Care Assistance Level of Assistance  Bathing, Feeding, Dressing Bathing Assistance: Limited assistance Feeding assistance: Independent Dressing Assistance: Limited assistance     Functional Limitations Info             SPECIAL CARE FACTORS FREQUENCY  PT (By licensed PT), OT (By licensed OT)     PT Frequency: 5 x week OT Frequency: 5 x week            Contractures Contractures Info: Not present    Additional Factors Info  Code Status, Allergies Code Status Info: Full Allergies Info: Rofecoxib,Tetracyclines &  Related,Benadryl Allergy Diphenhydramine Hcl, Cephalexin, Gatifloxacin           Current Medications (06/21/2019):  This is the current hospital active medication list Current Facility-Administered Medications  Medication Dose Route Frequency Provider Last Rate Last Dose  . 0.9 %  sodium chloride infusion  250 mL Intravenous PRN 06/23/2019 A, MD      . acetaminophen (TYLENOL) tablet 650 mg  650 mg Oral Q4H PRN Lorine Bears, MD   650 mg at 06/17/19 2225  . atorvastatin (LIPITOR) tablet 80 mg  80 mg Oral q1800 2226, MD   80 mg at 06/20/19 1759  . carvedilol (COREG) tablet 3.125 mg  3.125 mg Oral BID WC 06/22/19 A, MD   3.125 mg at 06/21/19 0836  . clobetasol ointment (TEMOVATE) 0.05 % 1 application  1 application Topical BID 06/23/19, PA-C   Stopped at 06/19/19 2231  . clopidogrel (PLAVIX) tablet 75 mg  75 mg Oral Q breakfast 2232 A, MD   75 mg at 06/21/19 0836  . dicyclomine (BENTYL) capsule 20 mg  20 mg Oral TID AC Kroeger, Krista M., PA-C   20 mg at 06/20/19 1619  . insulin aspart (novoLOG) injection 0-9 Units  0-9 Units Subcutaneous TID WC 06/22/19, MD   2 Units at 06/18/19 1800  . metroNIDAZOLE (METROGEL) 0.75 % gel   Topical BID Croitoru, Mihai,  MD      . nitroGLYCERIN (NITROSTAT) SL tablet 0.4 mg  0.4 mg Sublingual Q5 Min x 3 PRN Kathlyn Sacramento A, MD   0.4 mg at 06/18/19 0830  . ondansetron (ZOFRAN) injection 4 mg  4 mg Intravenous Q6H PRN Wellington Hampshire, MD   4 mg at 06/18/19 0315  . promethazine (PHENERGAN) tablet 25 mg  25 mg Oral Daily PRN Melina Copa N, PA-C   25 mg at 06/20/19 5631  . psyllium (HYDROCIL/METAMUCIL) packet 1 packet  1 packet Oral Daily Croitoru, Mihai, MD   1 packet at 06/21/19 [id number]  . rivaroxaban (XARELTO) tablet 10 mg  10 mg Oral Daily Sueanne Margarita, MD   10 mg at 06/21/19 0836  . simethicone (MYLICON) chewable tablet 80 mg  80 mg Oral QID PRN Roby Lofts M., PA-C   80 mg at 06/20/19 1617  . sodium  chloride flush (NS) 0.9 % injection 3 mL  3 mL Intravenous Q12H Wellington Hampshire, MD   3 mL at 06/20/19 2203  . sodium chloride flush (NS) 0.9 % injection 3 mL  3 mL Intravenous PRN Wellington Hampshire, MD         Discharge Medications: Please see discharge summary for a list of discharge medications.  Relevant Imaging Results:  Relevant Lab Results:   Additional Information [id number]- Social Security #  Adelfa Koh, Ocie Cornfield, RN

## 2019-06-21 NOTE — Progress Notes (Signed)
Physical Therapy Treatment Patient Details Name: Mary Ferguson MRN: [id number] DOB: 30-Jun-1951 Today's Date: 06/21/2019    History of Present Illness Mary Ferguson is an 68 y.o. female with PMH significant for morbid obesity (BMI 45.67); diabetest mellitus type 2; recurrent DVT on Xarelto (previously non-compliant with Coumadin); HTN; stage 3 CKD; chronic pain; asthma; and dyslipidemia who presented with NSTEMI on 10/22. mRCA occluded s/p PCI.     PT Comments    Pt with unsteady gait. Pt would like to go to [facility] for rehab. If they don't accept her recommend SNF.   Follow Up Recommendations  CIR(Pt wants to go to [facility]     Equipment Recommendations  None recommended by PT    Recommendations for Other Services       Precautions / Restrictions Precautions Precautions: Fall Restrictions Weight Bearing Restrictions: No    Mobility  Bed Mobility Overal bed mobility: Needs Assistance Bed Mobility: Supine to Sit;Sit to Supine     Supine to sit: Supervision Sit to supine: Supervision   General bed mobility comments: increased time, effort  Transfers Overall transfer level: Needs assistance Equipment used: Rolling walker (2 wheeled) Transfers: Sit to/from Stand Sit to Stand: Min guard         General transfer comment: Assist for safety and lines  Ambulation/Gait Ambulation/Gait assistance: Min assist Gait Distance (Feet): 80 Feet Assistive device: 4-wheeled walker Gait Pattern/deviations: Step-through pattern;Decreased stride length;Trunk flexed;Staggering right Gait velocity: decreased Gait velocity interpretation: <1.31 ft/sec, indicative of household ambulator General Gait Details: Assist for balance as pt staggered when turning. Pt stopped several times to straighten up against the wall to facilitated standing up straighter.    Stairs             Wheelchair Mobility    Modified Rankin (Stroke Patients Only)        Balance Overall balance assessment: Needs assistance Sitting-balance support: Feet supported Sitting balance-Leahy Scale: Good     Standing balance support: Bilateral upper extremity supported Standing balance-Leahy Scale: Poor Standing balance comment: rollator and min guard with static standing                            Cognition Arousal/Alertness: Awake/alert Behavior During Therapy: Anxious Overall Cognitive Status: Within Functional Limits for tasks assessed                                 General Comments: Pt doesn't want to go in hall at all. Pt very eager to please      Exercises      General Comments        Pertinent Vitals/Pain Pain Assessment: Faces Faces Pain Scale: Hurts little more Pain Location: chronic back Pain Descriptors / Indicators: Grimacing Pain Intervention(s): Limited activity within patient's tolerance;Monitored during session    Home Living                      Prior Function            PT Goals (current goals can now be found in the care plan section) Progress towards PT goals: Progressing toward goals    Frequency    Min 3X/week      PT Plan Discharge plan needs to be updated    Co-evaluation              AM-PAC PT "6  Clicks" Mobility   Outcome Measure  Help needed turning from your back to your side while in a flat bed without using bedrails?: None Help needed moving from lying on your back to sitting on the side of a flat bed without using bedrails?: None Help needed moving to and from a bed to a chair (including a wheelchair)?: A Little Help needed standing up from a chair using your arms (e.g., wheelchair or bedside chair)?: A Little Help needed to walk in hospital room?: A Little Help needed climbing 3-5 steps with a railing? : A Lot 6 Click Score: 19    End of Session   Activity Tolerance: Patient tolerated treatment well Patient left: with call bell/phone within reach;in  bed;with bed alarm set   PT Visit Diagnosis: Unsteadiness on feet (R26.81);Muscle weakness (generalized) (M62.81);Difficulty in walking, not elsewhere classified (R26.2)     Time: 4650-3546 PT Time Calculation (min) (ACUTE ONLY): 22 min  Charges:  $Gait Training: 8-22 mins                     [place] Pager [phone] Office [place] 06/21/2019, 4:55 PM

## 2019-06-21 NOTE — Progress Notes (Signed)
Progress Note  Patient Name: Mary Ferguson Date of Encounter: 06/21/2019  Primary Cardiologist: Thurmon FairMihai Croitoru, MD   Subjective   No chest pain. Ongoing abdominal pain for years.   Inpatient Medications    Scheduled Meds: . atorvastatin  80 mg Oral q1800  . carvedilol  3.125 mg Oral BID WC  . clobetasol ointment  1 application Topical BID  . clopidogrel  75 mg Oral Q breakfast  . dicyclomine  20 mg Oral TID AC  . insulin aspart  0-9 Units Subcutaneous TID WC  . metroNIDAZOLE   Topical BID  . psyllium  1 packet Oral Daily  . rivaroxaban  10 mg Oral Daily  . sodium chloride flush  3 mL Intravenous Q12H   Continuous Infusions: . sodium chloride     PRN Meds: sodium chloride, acetaminophen, nitroGLYCERIN, ondansetron (ZOFRAN) IV, promethazine, simethicone, sodium chloride flush   Vital Signs    Vitals:   06/20/19 0806 06/20/19 1948 06/21/19 0527 06/21/19 0601  BP: 140/65 122/61 124/66   Pulse: 61 67 60   Resp: 16 19 19    Temp: 98.4 F (36.9 C) 99.1 F (37.3 C) 98.4 F (36.9 C)   TempSrc: Oral Oral Oral   SpO2: 98% 97% 96%   Weight:    114.6 kg  Height:       No intake or output data in the 24 hours ending 06/21/19 0827 Last 3 Weights 06/21/2019 06/20/2019 06/19/2019  Weight (lbs) 252 lb 9.6 oz 255 lb 4.8 oz 257 lb 12.8 oz  Weight (kg) 114.579 kg 115.803 kg 116.937 kg      Telemetry    NSR - Personally Reviewed  ECG    No new tracing   Physical Exam   GEN: Obese female in No acute distress.   Neck: No JVD Cardiac: RRR, no murmurs, rubs, or gallops.  Respiratory: Clear to auscultation bilaterally. GI: Soft, nontender, non-distended  MS: No edema; No deformity. Neuro:  Nonfocal  Psych: Normal affect   Labs    High Sensitivity Troponin:   Recent Labs  Lab 06/17/19 1721 06/17/19 1921 06/19/19 0351  TROPONINIHS 213* 1,196* 12,617*      Chemistry Recent Labs  Lab 06/18/19 0348 06/19/19 0351 06/20/19 0445  NA 139 139 139  K 3.9 3.8 3.7   CL 107 107 104  CO2 21* 22 24  GLUCOSE 147* 124* 105*  BUN 11 8 10   CREATININE 1.01* 1.03* 1.12*  CALCIUM 9.2 9.4 9.4  PROT 6.5  --   --   ALBUMIN 3.3*  --   --   AST 60*  --   --   ALT 19  --   --   ALKPHOS 87  --   --   BILITOT 0.8  --   --   GFRNONAA 58* 56* 51*  GFRAA >60 >60 59*  ANIONGAP 11 10 11      Hematology Recent Labs  Lab 06/19/19 0351 06/20/19 0445 06/21/19 0457  WBC 7.7 7.3 7.9  RBC 4.99 4.73 4.82  HGB 13.3 12.6 12.9  HCT 42.9 40.2 40.4  MCV 86.0 85.0 83.8  MCH 26.7 26.6 26.8  MCHC 31.0 31.3 31.9  RDW 17.1* 16.5* 16.5*  PLT 254 240 258    BNP Recent Labs  Lab 06/17/19 1721  BNP 172.0*      Radiology    Dg Abd 1 View  Result Date: 06/19/2019 CLINICAL DATA:  Abdominal pain EXAM: ABDOMEN - 1 VIEW COMPARISON:  None. FINDINGS: The bowel gas pattern  is normal. Air-fluid levels cannot be evaluated on this supine exam. Surgical clips overlie the right upper quadrant. No radio-opaque calculi are seen. Degenerative changes are seen in the hips and spine. IMPRESSION: Nonobstructive bowel gas pattern. Electronically Signed   By: Zerita Boers M.D.   On: 06/19/2019 12:34    Cardiac Studies   Echo 06/18/2019 1. Left ventricular ejection fraction, by visual estimation, is 60 to 65%. The left ventricle has normal function. Normal left ventricular size. There is severely increased left ventricular hypertrophy. The left ventricular hypertrophy involves  basal-septum walls.  2. Left ventricular diastolic Doppler parameters are consistent with impaired relaxation pattern of LV diastolic filling.  3. Global right ventricle has normal systolic function.The right ventricular size is normal. No increase in right ventricular wall thickness.  4. Left atrial size was normal.  5. Right atrial size was normal.  6. Mild to moderate mitral annular calcification. No evidence of mitral valve regurgitation. No evidence of mitral stenosis.  7. The tricuspid valve is normal in  structure. Tricuspid valve regurgitation was not visualized by color flow Doppler.  8. The aortic valve is tricuspid Aortic valve regurgitation was not visualized by color flow Doppler. Mild aortic valve sclerosis without stenosis.  9. The pulmonic valve was normal in structure. Pulmonic valve regurgitation is not visualized by color flow Doppler. 10. The inferior vena cava is normal in size with greater than 50% respiratory variability, suggesting right atrial pressure of 3 mmHg.  CORONARY STENT INTERVENTION  06/18/2019  LEFT HEART CATH AND CORONARY ANGIOGRAPHY  Conclusion    There is mild left ventricular systolic dysfunction.  LV end diastolic pressure is mildly elevated.  The left ventricular ejection fraction is 35-45% by visual estimate.  Mid RCA lesion is 100% stenosed.  Post intervention, there is a 0% residual stenosis.  A drug-eluting stent was successfully placed using a STENT RESOLUTE ONYX 3.5X26.  Dist RCA lesion is 80% stenosed.  Post intervention, there is a 0% residual stenosis.  A drug-eluting stent was successfully placed using a STENT RESOLUTE ONYX 3.0X18.  Mid LAD lesion is 70% stenosed.  Prox LAD to Mid LAD lesion is 20% stenosed.  Prox Cx lesion is 30% stenosed.  3rd RPL lesion is 60% stenosed.   1.  Significant two-vessel coronary artery disease.  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  There is also significant stenosis in the distal RCA.  There is 70% stenosis in the mid LAD which appears to be chronic. 2.  Mildly reduced LV systolic function with an EF of 40% with inferior wall hypokinesis.  LVEDP was 22 mmHg. 3.  Successful PCI and drug-eluting stent placement to the mid as well as distal right coronary artery.  Recommendations: If the patient has a strong indication for continued long-term anticoagulation with Xarelto then I recommend resuming Xarelto tomorrow with clopidogrel monotherapy without  aspirin to minimize the risk of bleeding. If Xarelto can be discontinued, then the patient should be discharged on dual antiplatelet therapy. Recommend aggressive treatment of risk factors. LAD stenosis can likely be managed medically but if she has residual angina, PCI can be performed in the future.   Diagnostic Dominance: Right  Intervention    Patient Profile     68 y.o. female with recurrent DVT on Xarelto prior to admission (last event 09/2018), HTN, CKD stage III by GFR, chronic pain, asthma, recent viral illness in September 2020 with anosmia/diarrhea/weakness (not tested for Covid at that time) admitted  with chest pain amongst other positive ROS in context of chronic pain. Pain was pleuritic in nature, but troponin elevated to 1196.  Assessment & Plan    1. NSTEMI - Hs-troponin peaked > 12000. Cath with significant 2v CAD with occluded mRCA which is likely culprit lesion for MI and 70% mid LAD that appeared chronic s/p PCI of the mRCA. Plan to treat LAD disease medically  but if she has residual angina, PCI can be performed in the future. - Per cath note ASA stopped and will keep on Plavix and DOAC since she is on Xarelto for recurrent DVT. It is unclear if she is going to stay on Xarelto life long so if PCP stops Xarelto then needs to start back on ASA. - Continue Plavix, BB and high intensity statin   2. HTN - BP stable on current medications  3. HLD - 06/18/2019: Cholesterol 165; HDL 32; LDL Cholesterol 104; Triglycerides 146; VLDL 29  - Continue increased Lipitor to 80mg  qd - LFTs and Lipid panel in 6 weeks  4. Chronic abdominal pain - Appreciated IM consult.  No other recs at this time as this seems stable. - Avoid narcotics. Recommended compliance with Bentyl - She will follow up with PCP and GI (Dr. ) post discharge  5. Hyperglycemia - HgbA1c 6.5 - Prefer lifestyle changes  6. Deconditioning - weakness and difficulty walking due to chronic pain and morbid  obesity - Turned down for CIR - Social and care manager consult to determine [facility] vs SNF - Patient does not pefer HH due 4 dog at son's house  Likely discharge later today.   For questions or updates, please contact [facility] Please consult [email or web address] for contact info under        [facility], [facility]  06/21/2019, 8:27 AM

## 2019-06-21 NOTE — Progress Notes (Addendum)
Inpatient Rehabilitation-Admissions Coordinator   Please see PM&R PA Quillian Quince Anguilli's note from 10/26. Pt is not a candidate for CIR at this time. Will sign off and notify CM/SW regarding need for new dispo plans.   Please call if questions.   Jhonnie Garner, OTR/L  Rehab Admissions Coordinator  [phone] 06/21/2019 9:28 AM

## 2019-06-21 NOTE — Progress Notes (Signed)
CARDIAC REHAB PHASE I   PRE:  Rate/Rhythm: 78 SR  BP:  Supine: 151/78  Sitting:   Standing:    SaO2: 96%RA  MODE:  Ambulation: 80 ft in room only   POST:  Rate/Rhythm: 105 ST  BP:  Supine:   Sitting: 127/83  Standing:    SaO2: 96%RA 0940-1030 Pt stated she is almost agoraphobic and did not want to walk in hall but willing to walk in room. She has rollator and walker at home. Stated she stays a lot on her twin bed. Pt walked 80 ft in room using rolling walker. A little wobbly at times and decreased peripheral vision (not new per pt). To bathroom and then to recliner. Gave pt diabetic diet and briefly discussed carb counting. Encouraged her to read diabetic diet since she wants to try diet to control. Pt has call light and knows to call when needing to get up. Refer to PT for home recommendations.   Graylon Good, RN BSN  06/21/2019 10:24 AM

## 2019-06-21 NOTE — Progress Notes (Signed)
Physical medicine rehab consult requested chart reviewed.  Recommendations per physical therapy evaluation 06/19/2019 currently is for home health therapy.  Patient currently will not need inpatient rehab services.

## 2019-06-21 NOTE — Care Management Important Message (Signed)
Important Message  Patient Details  Name: Mary Ferguson MRN: [id number] Date of Birth: 06-09-1951   Medicare Important Message Given:  Yes     Shelda Altes 06/21/2019, 2:00 PM

## 2019-06-21 NOTE — TOC Initial Note (Signed)
Transition of Care [facility]) - Initial/Assessment Note    Patient Details  Name: Mary Ferguson MRN: [id number] Date of Birth: 09-21-1950  Transition of Care [facility]) CM/SW Contact:    Bethena Roys, RN Phone Number: 06/21/2019, 1:20 PM  Clinical Narrative: Pt presented for Nstemi- PT recommendations for supervision. Patient wants to go to inpatient rehab At [facility]. Per patient she was at this Inpatient rehab June of 2019. CM did fax information to [phone]. CM has been speaking to [place] at [phone]. CM did call to see if she received fax and had not. CM to fax again. Patient is aware that she will have to endure 3.5 hours of intense therapy if insurance approves. CM received call from [place] -Patient will need 2 therapy need and hospital level of care. CM reached out to MD for OT order. Patient also wanted CM to fax her out to [place], [facility] and [place]. FL[facility] and SNF uploaded to hub will await if anyone agreeable to accept the patient. CM will continue to monitor for additional needs.                   Expected Discharge Plan: IP Rehab Facility Barriers to Discharge: No Barriers Identified   Patient Goals and CMS Choice Patient states their goals for this hospitalization and ongoing recovery are:: "to get some rehab" CMS Medicare.gov Compare Post Acute Care list provided to:: Patient Choice offered to / list presented to : Patient  Expected Discharge Plan and Services Expected Discharge Plan: [place] In-house Referral: NA Discharge Planning Services: CM Consult Post Acute Care Choice: IP Rehab Living arrangements for the past 2 months: Single Family Home                   Prior Living Arrangements/Services Living arrangements for the past 2 months: Single Family Home Lives with:: Adult Children Patient language and need for interpreter reviewed:: Yes        Need for Family Participation in Patient Care: Yes (Comment) Care  giver support system in place?: Yes (comment)   Criminal Activity/Legal Involvement Pertinent to Current Situation/Hospitalization: No - Comment as needed  Activities of Daily Living Home Assistive Devices/Equipment: Cane (specify quad or straight), Walker (specify type) ADL Screening (condition at time of admission) Patient's cognitive ability adequate to safely complete daily activities?: Yes Is the patient deaf or have difficulty hearing?: No Does the patient have difficulty seeing, even when wearing glasses/contacts?: No Does the patient have difficulty concentrating, remembering, or making decisions?: No Patient able to express need for assistance with ADLs?: Yes Does the patient have difficulty dressing or bathing?: No Independently performs ADLs?: Yes (appropriate for developmental age) Does the patient have difficulty walking or climbing stairs?: No Weakness of Legs: Both Weakness of Arms/Hands: Both  Permission Sought/Granted Permission sought to share information with : Family Supports, Chartered certified accountant granted to share information with : Yes, Verbal Permission Granted     Permission granted to share info w AGENCY: [facility] (inpatient)        Emotional Assessment Appearance:: Appears stated age Attitude/Demeanor/Rapport: Gracious Affect (typically observed): Anxious Orientation: : Oriented to Self, Oriented to Place, Oriented to  Time, Oriented to Situation   Psych Involvement: No (comment)  Admission diagnosis:  CODE STEMI Patient Active Problem List   Diagnosis Date Noted  . Recurrent deep vein thrombosis (DVT) ([place])   . Morbid obesity ([place])   . Hypertension   . CKD (  chronic kidney disease), stage III   . Chronic pain   . Chronic generalized abdominal pain   . NSTEMI (non-ST elevated myocardial infarction) (HCC) 06/17/2019  . DVT (deep venous thrombosis) (HCC) 09/29/2018   PCP:  Merlene Laughter, MD Pharmacy:    [id number] - [place], [place] - [address] [place] [address] Phone: [phone] Fax: [phone]     Social Determinants of Health (SDOH) Interventions    Readmission Risk Interventions No flowsheet data found.

## 2019-06-22 ENCOUNTER — Telehealth: Payer: Self-pay | Admitting: Cardiovascular Disease

## 2019-06-22 DIAGNOSIS — Z955 Presence of coronary angioplasty implant and graft: Secondary | ICD-10-CM

## 2019-06-22 DIAGNOSIS — G8929 Other chronic pain: Secondary | ICD-10-CM

## 2019-06-22 DIAGNOSIS — R1084 Generalized abdominal pain: Secondary | ICD-10-CM

## 2019-06-22 LAB — APTT: aPTT: 23 seconds — ABNORMAL LOW (ref 24–36)

## 2019-06-22 LAB — CBC
HCT: 43.8 % (ref 36.0–46.0)
Hemoglobin: 14.3 g/dL (ref 12.0–15.0)
MCH: 27.4 pg (ref 26.0–34.0)
MCHC: 32.6 g/dL (ref 30.0–36.0)
MCV: 83.9 fL (ref 80.0–100.0)
Platelets: 355 10*3/uL (ref 150–400)
RBC: 5.22 MIL/uL — ABNORMAL HIGH (ref 3.87–5.11)
RDW: 16.7 % — ABNORMAL HIGH (ref 11.5–15.5)
WBC: 7.9 10*3/uL (ref 4.0–10.5)
nRBC: 0 % (ref 0.0–0.2)

## 2019-06-22 LAB — GLUCOSE, CAPILLARY
Glucose-Capillary: 105 mg/dL — ABNORMAL HIGH (ref 70–99)
Glucose-Capillary: 108 mg/dL — ABNORMAL HIGH (ref 70–99)
Glucose-Capillary: 107 mg/dL — ABNORMAL HIGH (ref 70–99)

## 2019-06-22 MED ORDER — CARVEDILOL 3.125 MG PO TABS: 3.1250 mg | ORAL_TABLET | Freq: Two times a day (BID) | ORAL | 6 refills | Status: DC

## 2019-06-22 MED ORDER — ATORVASTATIN CALCIUM 80 MG PO TABS: 80.0000 mg | ORAL_TABLET | Freq: Every day | ORAL | 6 refills | Status: DC

## 2019-06-22 MED ORDER — NITROGLYCERIN 0.4 MG SL SUBL: 0.4000 mg | SUBLINGUAL_TABLET | SUBLINGUAL | 12 refills | Status: DC | PRN

## 2019-06-22 MED ORDER — CLOPIDOGREL BISULFATE 75 MG PO TABS: 75.0000 mg | ORAL_TABLET | Freq: Every day | ORAL | 11 refills | Status: DC

## 2019-06-22 NOTE — Plan of Care (Signed)
  Problem: Education: Goal: Knowledge of General Education information will improve Description: Including pain rating scale, medication(s)/side effects and non-pharmacologic comfort measures Outcome: Adequate for Discharge   Problem: Education: Goal: Understanding of CV disease, CV risk reduction, and recovery process will improve Outcome: Adequate for Discharge Goal: Individualized Educational Video(s) Outcome: Adequate for Discharge   Problem: Activity: Goal: Ability to return to baseline activity level will improve Outcome: Completed/Met   Problem: Cardiovascular: Goal: Ability to achieve and maintain adequate cardiovascular perfusion will improve Outcome: Completed/Met Goal: Vascular access site(s) Level 0-1 will be maintained Outcome: Completed/Met   Problem: Health Behavior/Discharge Planning: Goal: Ability to safely manage health-related needs after discharge will improve Outcome: Completed/Met

## 2019-06-22 NOTE — Progress Notes (Signed)
CARDIAC REHAB PHASE I   PRE:  Rate/Rhythm: 78 SR  BP:  Supine:   Sitting: 118/59  Standing:    SaO2:   MODE:  Ambulation: 125 ft   POST:  Rate/Rhythm: 96 SR  BP:  Supine:   Sitting: 114/82  Standing:    SaO2: 93%RA 1048-1122 Pt wanted to know if I was an OT and would be evaluating. Discussed with pt that I was from [facility] and that we had walked yesterday in room and that I was a Therapist, sports. Pt wanted my opinion on what she needs. I told her PT/OT makes recommendations on level of care needed but I did think she needs more Rehab.  Whether it is Inpatient or Outpatient depends on their recommendation. We also discussed how much stronger and steadier she will need to be if she is considering CRP 2 in future. Pt did not want to walk outside of room, so we walked from bed to door 5 times with walker. She stopped a couple of times to rest and straighten back. Loss of balance once which she contributed to back issues. Son came in at end of walk. Pt to sitting on side of bed.  Emotional support given as pt stated she is very anxious.   Graylon Good, RN BSN  06/22/2019 11:15 AM

## 2019-06-22 NOTE — Progress Notes (Signed)
Occupational Therapy Evaluation Patient Details Name: Mary Ferguson MRN: [id number] DOB: Feb 13, 1951 Today's Date: 06/22/2019    History of Present Illness Mary Ferguson is an 68 y.o. female with PMH significant for morbid obesity (BMI 45.67); diabetest mellitus type 2; recurrent DVT on Xarelto (previously non-compliant with Coumadin); HTN; stage 3 CKD; chronic pain; asthma; and dyslipidemia who presented with NSTEMI on 10/22. mRCA occluded s/p PCI.    Clinical Impression   PTA, pt was living at home with her son and daughter-in-law, pt reports she was mostly independent with ADL, pt's son and daughter-in-law assisted with cooking, cleaning, and ADL/IADL. Pt reports she was modified independent with functional mobility with use of spc in her house.  Pt currently requires modA for LB ADL and minA for grooming at sink level and functional mobility at RW level. Pt had 3 occassions of a LOB during functional mobility in the room. Due to decline in current level of function, pt would benefit from acute OT to address established goals to facilitate safe D/C to venue listed below. At this time, recommend CIR follow-up. Will continue to follow acutely.     Follow Up Recommendations  CIR    Equipment Recommendations  3 in 1 bedside commode    Recommendations for Other Services       Precautions / Restrictions Precautions Precautions: Fall Restrictions Weight Bearing Restrictions: No      Mobility Bed Mobility               General bed mobility comments: sitting EOB upon arrival  Transfers Overall transfer level: Needs assistance Equipment used: Rolling walker (2 wheeled) Transfers: Sit to/from Stand Sit to Stand: Min assist         General transfer comment: minA for stability in standing moderate LOB with initial standing;reports dizziness; BP 117/68    Balance Overall balance assessment: Needs assistance Sitting-balance support: Feet supported Sitting balance-Leahy Scale:  Good     Standing balance support: Bilateral upper extremity supported Standing balance-Leahy Scale: Poor Standing balance comment: rollator and min guard with static standing                           ADL either performed or assessed with clinical judgement   ADL Overall ADL's : Needs assistance/impaired Eating/Feeding: Set up;Sitting   Grooming: Minimal assistance;Standing Grooming Details (indicate cue type and reason): at sink level Upper Body Bathing: Min guard;Sitting   Lower Body Bathing: Moderate assistance;Minimal assistance;Sit to/from stand   Upper Body Dressing : Min guard;Sitting   Lower Body Dressing: Moderate assistance;Minimal assistance;Sit to/from stand   Toilet Transfer: Minimal assistance;RW;Ambulation StatisticianToilet Transfer Details (indicate cue type and reason): simulated in room Toileting- Clothing Manipulation and Hygiene: Minimal assistance;Sit to/from stand       Functional mobility during ADLs: Minimal assistance;Rolling walker General ADL Comments: decreased activity tolerance secondary to back pain;2x LOB during mobiltiy      Vision Patient Visual Report: No change from baseline       Perception     Praxis      Pertinent Vitals/Pain Pain Assessment: 0-10 Pain Score: 7  Faces Pain Scale: Hurts little more Pain Location: chronic back Pain Descriptors / Indicators: Grimacing Pain Intervention(s): Limited activity within patient's tolerance;Monitored during session     Hand Dominance Right   Extremity/Trunk Assessment Upper Extremity Assessment Upper Extremity Assessment: Generalized weakness   Lower Extremity Assessment Lower Extremity Assessment: Defer to PT evaluation   Cervical / Trunk Assessment Cervical /  Trunk Assessment: Normal   Communication Communication Communication: No difficulties   Cognition Arousal/Alertness: Awake/alert Behavior During Therapy: Anxious Overall Cognitive Status: Within Functional Limits  for tasks assessed                                 General Comments: Pt doesn't want to go in hall at all. Pt very eager to please   General Comments  pts son present throughout session;VSS during session HR up to 98bpm    Exercises     Shoulder Instructions      Home Living Family/patient expects to be discharged to:: Private residence Living Arrangements: Children(son, daughter in law) Available Help at Discharge: Family Type of Home: House Home Access: Stairs to enter     Home Layout: One level     Bathroom Shower/Tub: Tub/shower unit         Home Equipment: Environmental consultant - 2 wheels;Walker - 4 wheels;Tub bench          Prior Functioning/Environment Level of Independence: Needs assistance  Gait / Transfers Assistance Needed: uses walker for household distances ADL's / Homemaking Assistance Needed: requires assist with IADL's            OT Problem List: Decreased strength;Decreased range of motion;Decreased activity tolerance;Impaired balance (sitting and/or standing);Decreased safety awareness;Decreased knowledge of use of DME or AE;Cardiopulmonary status limiting activity;Pain      OT Treatment/Interventions:      OT Goals(Current goals can be found in the care plan section) Acute Rehab OT Goals Patient Stated Goal: "participate in the cardiac rehab program." OT Goal Formulation: With patient Time For Goal Achievement: 07/06/19 Potential to Achieve Goals: Good ADL Goals Pt Will Perform Grooming: with modified independence Pt Will Perform Upper Body Dressing: with modified independence Pt Will Perform Lower Body Dressing: with modified independence;sit to/from stand;with adaptive equipment Pt Will Transfer to Toilet: with modified independence;ambulating Pt Will Perform Toileting - Clothing Manipulation and hygiene: with modified independence;sit to/from stand  OT Frequency: Min 2X/week   Barriers to D/C: Decreased caregiver support           Co-evaluation              AM-PAC OT "6 Clicks" Daily Activity     Outcome Measure Help from another person eating meals?: None Help from another person taking care of personal grooming?: A Little Help from another person toileting, which includes using toliet, bedpan, or urinal?: A Little Help from another person bathing (including washing, rinsing, drying)?: A Little Help from another person to put on and taking off regular upper body clothing?: A Little Help from another person to put on and taking off regular lower body clothing?: A Little 6 Click Score: 19   End of Session Equipment Utilized During Treatment: Gait belt;Rolling walker Nurse Communication: Mobility status  Activity Tolerance: Patient tolerated treatment well;Patient limited by pain Patient left: in bed;with call bell/phone within reach;with family/visitor present  OT Visit Diagnosis: Unsteadiness on feet (R26.81);Other abnormalities of gait and mobility (R26.89);Muscle weakness (generalized) (M62.81);History of falling (Z91.81);Pain Pain - part of body: (back and abdomen)                Time: 1497-0263 OT Time Calculation (min): 24 min Charges:  OT General Charges $OT Visit: 1 Visit OT Evaluation $OT Eval Moderate Complexity: 1 Mod OT Treatments $Self Care/Home Management : 8-22 mins  Dorinda Hill OTR/L [place] Office: [phone]  Mary Ferguson Alert 06/22/2019, 2:13 PM

## 2019-06-22 NOTE — Telephone Encounter (Signed)
New message   Per Vin Bhagat scheduled TOC appt on 06/30/2019 at 8:30am with Kerin Ransom.

## 2019-06-22 NOTE — TOC Progression Note (Addendum)
Transition of Care [facility]) - Progression Note    Patient Details  Name: Mary Ferguson MRN: [id number] Date of Birth: Dec 31, 1950  Transition of Care [facility]) CM/SW Contact  Graves-Bigelow, Ocie Cornfield, RN Phone Number: 06/22/2019, 2:40 PM  Clinical Narrative: Patient has been faxed back out to [facility] to hear from Admissions Coordinator. Information OT notes sent to [facility]-awaiting to see if patient is candidate for Inpatient Rehab. CM will continue to follow for transition of care needs.      06-22-19 Just heard back, no beds available at [facility]   Expected Discharge Plan: IP Rehab Facility Barriers to Discharge: No Barriers Identified  Expected Discharge Plan and Services Expected Discharge Plan: [place] In-house Referral: NA Discharge Planning Services: CM Consult Post Acute Care Choice: IP Rehab Living arrangements for the past 2 months: Single Family Home                   Social Determinants of Health (SDOH) Interventions    Readmission Risk Interventions No flowsheet data found.

## 2019-06-22 NOTE — TOC Progression Note (Signed)
Transition of Care [facility]) - Progression Note    Patient Details  Name: Mary Ferguson MRN: [id number] Date of Birth: 17-Mar-1951  Transition [facility] Care [facility]) CM/SW Contact  Graves-Bigelow, Ocie Cornfield, RN Phone Number: 06/22/2019, 11:18 AM  Clinical Narrative:  Patient chose 3 SNF's- all three have declined. Patient aware. CM is awaiting OT for therapy notes to submit to [facility] for insurance authorization. MD has stated that patient will need to transition home today. Will speak with patient regarding alternative care.     Expected Discharge Plan: IP Rehab Facility Barriers to Discharge: No Barriers Identified  Expected Discharge Plan and Services Expected Discharge Plan: [place] In-house Referral: NA Discharge Planning Services: CM Consult Post Acute Care Choice: IP Rehab Living arrangements for the past 2 months: Single Family Home                   Social Determinants of Health (SDOH) Interventions    Readmission Risk Interventions No flowsheet data found.

## 2019-06-22 NOTE — TOC Transition Note (Signed)
Transition of Care [facility]) - CM/SW Discharge Note   Patient Details  Name: Mary Ferguson MRN: [id number] Date of Birth: 02-Aug-1951  Transition of Care [facility]) CM/SW Contact:  Bethena Roys, RN Phone Number: 06/22/2019, 4:36 PM   Clinical Narrative:  CM received call from [facility]- Physician is not onsite to look at clinicals. PA has stated that patient will d/c today. Patient has D/c order- family agreeable to go home with outpatient therapy. Ambulatory referral has been made. No further needs from CM at this time.     Final next level of care: Home/Self Care(Declined HH Services- Sent out to several SNF's- no bed availability. CIR- declined. Plan for outpatient PT/OT) Barriers to Discharge: No Barriers Identified   Patient Goals and CMS Choice Patient states their goals for this hospitalization and ongoing recovery are:: "to get some rehab" CMS Medicare.gov Compare Post Acute Care list provided to:: Patient Choice offered to / list presented to : Patient  Discharge Placement                       Discharge Plan and Services In-house Referral: NA Discharge Planning Services: CM Consult Post Acute Care Choice: IP Rehab                    HH Arranged: Patient Refused HH          Social Determinants of Health (SDOH) Interventions     Readmission Risk Interventions No flowsheet data found.

## 2019-06-22 NOTE — Discharge Summary (Signed)
Discharge Summary    Patient ID: Mary Ferguson MRN: [id number]; DOB: 07-16-1951  Admit date: 06/17/2019 Discharge date: 06/22/2019  Primary Care Provider: Merlene Laughter, MD  Primary Cardiologist: Mary Fair, MD   Discharge Diagnoses    Principal Problem:   NSTEMI (non-ST elevated myocardial infarction) [facility]) Active Problems:   Recurrent deep vein thrombosis (DVT) (HCC)   Morbid obesity (HCC)   Hypertension   CKD (chronic kidney disease), stage III   Chronic pain   Chronic generalized abdominal pain   Status post coronary artery stent placement   Allergies Allergies  Allergen Reactions  . Rofecoxib Anaphylaxis    VIOXX  . Tetracyclines & Related Nausea And Vomiting  . Benadryl Allergy [Diphenhydramine Hcl] Other (See Comments)    Depressed respirations  . Cephalexin Rash  . Gatifloxacin Other (See Comments)    Confusion Very low blood pressure    Diagnostic Studies/Procedures    Echo 06/18/2019 1. Left ventricular ejection fraction, by visual estimation, is 60 to 65%. The left ventricle has normal function. Normal left ventricular size. There is severely increased left ventricular hypertrophy. The left ventricular hypertrophy involves  basal-septum walls. 2. Left ventricular diastolic Doppler parameters are consistent with impaired relaxation pattern of LV diastolic filling. 3. Global right ventricle has normal systolic function.The right ventricular size is normal. No increase in right ventricular wall thickness. 4. Left atrial size was normal. 5. Right atrial size was normal. 6. Mild to moderate mitral annular calcification. No evidence of mitral valve regurgitation. No evidence of mitral stenosis. 7. The tricuspid valve is normal in structure. Tricuspid valve regurgitation was not visualized by color flow Doppler. 8. The aortic valve is tricuspid Aortic valve regurgitation was not visualized by color flow Doppler. Mild aortic valve sclerosis without  stenosis. 9. The pulmonic valve was normal in structure. Pulmonic valve regurgitation is not visualized by color flow Doppler. 10. The inferior vena cava is normal in size with greater than 50% respiratory variability, suggesting right atrial pressure of 3 mmHg.  CORONARY STENT INTERVENTION  06/18/2019  LEFT HEART CATH AND CORONARY ANGIOGRAPHY  Conclusion    There is mild left ventricular systolic dysfunction.  LV end diastolic pressure is mildly elevated.  The left ventricular ejection fraction is 35-45% by visual estimate.  Mid RCA lesion is 100% stenosed.  Post intervention, there is a 0% residual stenosis.  A drug-eluting stent was successfully placed using a STENT RESOLUTE ONYX 3.5X26.  Dist RCA lesion is 80% stenosed.  Post intervention, there is a 0% residual stenosis.  A drug-eluting stent was successfully placed using a STENT RESOLUTE ONYX 3.0X18.  Mid LAD lesion is 70% stenosed.  Prox LAD to Mid LAD lesion is 20% stenosed.  Prox Cx lesion is 30% stenosed.  3rd RPL lesion is 60% stenosed.  1. Significant two-vessel coronary artery disease. 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 There is also significant stenosis in the distal RCA. There is 70% stenosis in the mid LAD which appears to be chronic. 2. Mildly reduced LV systolic function with an EF of 40% with inferior wall hypokinesis. LVEDP was 22 mmHg. 3. Successful PCI and drug-eluting stent placement to the mid as well as distal right coronary artery.  Recommendations: If the patient has a strong indication for continued long-term anticoagulation with Xarelto then I recommend resuming Xarelto tomorrow with clopidogrel monotherapy without aspirin to minimize the risk of bleeding. If Xarelto can be discontinued, then the patient should be discharged on dual antiplatelet  therapy. Recommend aggressive treatment of risk factors. LAD stenosis can likely be  managed medically but if she has residual angina, PCI can be performed in the future.   Diagnostic Dominance: Right  Intervention       History of Present Illness     Mary Ferguson is a 68 y.o. female with recurrent DVT on Xarelto prior to admission (last event 09/2018), HTN, CKD stage III by GFR, chronic pain, asthma, recent viral illness in September 2020 with anosmia/diarrhea/weakness (not tested for Covid at that time) admitted with chest pain amongst other positive ROS in context of chronic pain.  Mary Ferguson was awoken from sleep with chest discomfort at 2 AM on 10/22. At that time it was also associated with rapid palpitations.  Her pulse oximeter showed normal oxygen saturation but a heart rate of about 150 bpm.  The symptoms gradually subsided spontaneously but have recurred at least twice throughout the day, most recently at 2 PM.  Each time she reports that her heart will be racing.  Her chest pain has now improved, but is back to what is a baseline complaint of abdominal discomfort radiating up into her chest at 7-8/10.  This is always present.  The chest discomfort is very much pleuritic in nature.  It feels better when she sits up and is worsened by deep respiration.  She has not had cough or hemoptysis, but her breathing is difficult because of the pain.  She does not describe frank orthopnea.  She has not experienced syncope or dizziness.  She has a lot of nausea but has not vomited.  Her ECG shows roughly 0.5-1 mm ST segment elevation seen most prominently in leads III and aVF but also in V4-V6 with what appears to be reciprocal ST depression in lead V2, 1, aVL.  There are Q waves in lead III, but not in lead II or aVF.  A repeat electrocardiogram performed just 1 hour after arrival shows a virtual resolution of the ST segment depression in lead V2 and improvement in 1 and aVL.  Her first high-sensitivity troponin is elevated at 213.  Her review of systems is significant for  very numerous complaints that are difficult to tease out.  She believes that she had an acute viral illness in early September that lasted for at least 2 weeks.  At one point she lost her sense of smell and she had issues with diarrhea.  She has been feeling weak ever since then.  She no longer has anosmia.  She never sought medical attention and has not been tested for coronavirus.  She does not have any known Covid contacts.  She does not have recent fever or chills or diarrhea.  She has a history of chronic pain in her back related to degenerative disc disease and chronic abdominal pain related to multiple previous surgeries.  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  She has had problems with irregular bowel movements and abdominal pain ever since.  Lives with "chronic 7-8/10" pain.  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  She is afraid of becoming addicted to opiates again.  She was hospitalized in February 2020 with recurrent right lower extremity DVT and was restarted on oral anticoagulants.  She reports being extremely compliant with anticoagulants ever since, despite reports of noncompliance in her chart.  Hospital Course     Consultants: IM  1. NSTEMI - Hs-troponin peaked > 12000. Cathwithsignificant 2v CAD with occluded mRCA which is likely culprit lesion for MI and 70% mid LAD that appeared chronic s/p PCI of the mRCA. Plan to treat LAD disease medically  but if she has residual angina, PCI can be performed in the future. - Per cath note ASAstoppedandwillkeep on Plavix and DOAC since she is on Xarelto  daily for recurrent DVT.  No recurrent chest pain.  Continue Plavix, BB and high intensity statin   2. HTN - BP stable on current medications  3. HLD - 06/18/2019: Cholesterol 165; HDL 32;  LDL Cholesterol 104; Triglycerides 146; VLDL 29  - Continue increased Lipitor to  qd - LFTs and Lipid panel in 6 weeks  4. Chronic abdominal pain - Appreciated IM consult. No other recs at this time as this seems stable. - Avoid narcotics. Recommended compliance with Bentyl - She will follow up with PCP and GI (Dr. Jim Desanctis) post discharge  5. Hyperglycemia - HgbA1c 6.5 - Prefer lifestyle changes  6. Deconditioning - weakness and difficulty walking due to chronic pain and morbid obesity - Turned down for CIR (see case manager note) however patient requested CIR multiple  - Case manager could not found bed for SNF and recommended to discharge today (been waiting for bed for past 2 days as she is medically stable for discharge) with HH  7.  Recurrent DVT -She is on Xarelto 10 mg daily as a maintenance dose.  Pharmacist reviewed her dose and recommended increasing to 20 mg however Dr. Mayford Knife reviewed literature and recommended to  continue current dose of Xarelto 10 mg daily.  Did the patient have an acute coronary syndrome (MI, NSTEMI, STEMI, etc) this admission?:  Yes                               AHA/ACC Clinical Performance & Quality Measures: 1. Aspirin prescribed? - Yes 2. ADP Receptor Inhibitor (Plavix/Clopidogrel, Brilinta/Ticagrelor or Effient/Prasugrel) prescribed (includes medically managed patients)? - Yes 3. Beta Blocker prescribed? - Yes 4. High Intensity Statin (Lipitor 40-80mg  or Crestor 20-40mg ) prescribed? - Yes 5. EF assessed during THIS hospitalization? - Yes 6. For EF <40%, was ACEI/ARB prescribed? - Not Applicable (EF >/= 40%) 7. For EF <40%, Aldosterone Antagonist (Spironolactone or Eplerenone) prescribed? - Not Applicable (EF >/= 40%) 8. Cardiac Rehab Phase II ordered (Included Medically managed Patients)? - Yes   _____________  Discharge Vitals Blood pressure 125/77, pulse 62, temperature 98 F (36.7 C), temperature source Oral, resp. rate 18, height 5'  3" (1.6 m), weight 114.7 kg, SpO2 95 %.  Filed Weights   06/20/19 0500 06/21/19 0601 06/22/19 0615  Weight: 115.8 kg 114.6 kg 114.7 kg    Labs & Radiologic Studies    CBC Recent Labs    06/21/19 0457 06/22/19 0601  WBC 7.9 7.9  HGB 12.9 14.3  HCT 40.4 43.8  MCV 83.8 83.9  PLT 258 355   Basic Metabolic Panel Recent Labs    16/10/96 0445  NA 139  K 3.7  CL 104  CO2 24  GLUCOSE 105*  BUN 10  CREATININE 1.12*  CALCIUM 9.4   Liver Function Tests No results for input(s): AST, ALT, ALKPHOS, BILITOT, PROT, ALBUMIN in the last 72 hours. No results for input(s): LIPASE, AMYLASE in the last 72 hours. High Sensitivity Troponin:   Recent Labs  Lab 06/17/19  1721 06/17/19 1921 06/19/19 0351  TROPONINIHS 213* 1,196* 12,617*    BNP Invalid input(s): POCBNP D-Dimer No results for input(s): DDIMER in the last 72 hours. Hemoglobin A1C No results for input(s): HGBA1C in the last 72 hours. Fasting Lipid Panel No results for input(s): CHOL, HDL, LDLCALC, TRIG, CHOLHDL, LDLDIRECT in the last 72 hours. Thyroid Function Tests No results for input(s): TSH, T4TOTAL, T3FREE, THYROIDAB in the last 72 hours.  Invalid input(s): FREET3 _____________  Cathleen Cortig Abd 1 View  Result Date: 06/19/2019 CLINICAL DATA:  Abdominal pain EXAM: ABDOMEN - 1 VIEW COMPARISON:  None. FINDINGS: The bowel gas pattern is normal. Air-fluid levels cannot be evaluated on this supine exam. Surgical clips overlie the right upper quadrant. No radio-opaque calculi are seen. Degenerative changes are seen in the hips and spine. IMPRESSION: Nonobstructive bowel gas pattern. Electronically Signed   By: Romona Curlsyler  Litton M.D.   On: 06/19/2019 12:34   Dg Chest Portable 1 View  Result Date: 06/17/2019 CLINICAL DATA:  Chest pain EXAM: PORTABLE CHEST 1 VIEW COMPARISON:  None. FINDINGS: Low lung volumes. Streaky basilar atelectasis or scar. Cardiomegaly. No pneumothorax. IMPRESSION: 1. Low lung volumes with streaky basilar  atelectasis or scar 2. Mild cardiomegaly Electronically Signed   By: Jasmine PangKim  Fujinaga M.D.   On: 06/17/2019 18:11   Disposition   Pt is being discharged home today in good condition.  Follow-up Plans & Appointments    Follow-up Information    Abelino DerrickKilroy, Luke K, [place]C. Go on 06/30/2019.   Specialties: Cardiology, Radiology Why: @8 :30am for hospital follow up  Contact information: [address] [place] [address] [phone]        [facility] Follow up.   Specialty: Rehabilitation Why: Outpatient Physical and Occupational Therapy-office to call you within 2-3 business days to set up time to visit.  Contact information: [address] [phone] [id number] [place] [id number] [phone]         Discharge Instructions    Amb Referral to Cardiac Rehabilitation   Complete by: As directed    Diagnosis:  Coronary Stents NSTEMI     After initial evaluation and assessments completed: Virtual Based Care may be provided alone or in conjunction with Phase 2 Cardiac Rehab based on patient barriers.: Yes   Ambulatory referral to Occupational Therapy   Complete by: As directed    Outpatient Occupational Therapy evaluation and treatment.   Ambulatory referral to Physical Therapy   Complete by: As directed    Outpatient Physical Therapy evaluation and treatment.   Diet - low sodium heart healthy   Complete by: As directed    Discharge instructions   Complete by: As directed    NO HEAVY LIFTING (>10lbs) X 2 WEEKS. NO SEXUAL ACTIVITY X 2 WEEKS. NO DRIVING X 1 WEEK. NO SOAKING BATHS, HOT TUBS, POOLS, ETC., X 7 DAYS.   Increase activity slowly   Complete by: As directed       Discharge Medications   Allergies as of 06/22/2019      Reactions   Rofecoxib Anaphylaxis   VIOXX   Tetracyclines & Related Nausea And Vomiting   Benadryl Allergy [diphenhydramine Hcl] Other (See Comments)   Depressed respirations    Cephalexin Rash   Gatifloxacin Other (See Comments)   Confusion Very low blood pressure      Medication List    TAKE these medications   atorvastatin 80 MG tablet Commonly known as: LIPITOR Take 1 tablet (80 mg total)  by mouth daily. What changed:   medication strength  how much to take   carvedilol 3.125 MG tablet Commonly known as: COREG Take 1 tablet (3.125 mg total) by mouth 2 (two) times daily with a meal.   clobetasol ointment 0.05 % Commonly known as: TEMOVATE Apply 1 application topically 2 (two) times daily.   clopidogrel 75 MG tablet Commonly known as: PLAVIX Take 1 tablet (75 mg total) by mouth daily with breakfast. Start taking on: June 23, 2019   hydrOXYzine 25 MG tablet Commonly known as: ATARAX/VISTARIL Take 25 mg by mouth 2 (two) times daily as needed for itching.   metroNIDAZOLE 0.75 % cream Commonly known as: METROCREAM Apply 1 application topically 2 (two) times daily.   nitroGLYCERIN 0.4 MG SL tablet Commonly known as: NITROSTAT Place 1 tablet (0.4 mg total) under the tongue every 5 (five) minutes x 3 doses as needed for chest pain.   promethazine 25 MG tablet Commonly known as: PHENERGAN Take 25 mg by mouth daily as needed for nausea.   psyllium 28 % packet Commonly known as: METAMUCIL SMOOTH TEXTURE Take 1 packet by mouth 2 (two) times daily.   rivaroxaban 10 MG Tabs tablet Commonly known as: XARELTO Take 10 mg by mouth every evening. What changed: Another medication with the same name was removed. Continue taking this medication, and follow the directions you see here.          Outstanding Labs/Studies   Lipid panel and LFTs in 6 weeks  Duration of Discharge Encounter   Greater than 30 minutes including physician time.  Jarrett Soho, PA 06/22/2019, 3:53 PM

## 2019-06-22 NOTE — TOC Transition Note (Signed)
Transition of Care [facility]) - CM/SW Discharge Note   Patient Details  Name: Mary Ferguson MRN: [id number] Date of Birth: 04/07/1951  [facility] CM/SW Contact:  Bethena Roys, RN Phone Number: 06/22/2019, 3:47 PM   Clinical Narrative:  Patient was declined by [facility] is now agreeable to Outpatient PT/OT at [facility] -ambulatory referral sent via Epic. Once going back in to the room-pt then asked if I could fax her out to [facility] did fax paperwork. Called Pam with CIR- awaiting call back. Outpatient Therapy will call the patient within 2-3 business days for visit time. Patient has previously declined [facility]. MD aware to write d/c order for transition home. No further needs from CM at this time.    Final next level of care: Home/Self Care(Declined HH Services- Sent out to several SNF's- no bed availability. CIR- declined. Plan for outpatient PT/OT) Barriers to Discharge: No Barriers Identified   Patient Goals and CMS Choice Patient states their goals for this hospitalization and ongoing recovery are:: "to get some rehab" CMS Medicare.gov Compare Post Acute Care list provided to:: Patient Choice offered to / list presented to : Patient   Discharge Plan and Services In-house Referral: NA Discharge Planning Services: CM Consult Post Acute Care Choice: IP Rehab            HH Arranged: Patient Refused HH      Social Determinants of Health (SDOH) Interventions     Readmission Risk Interventions No flowsheet data found.

## 2019-06-22 NOTE — Telephone Encounter (Signed)
Patient is currently admitted to hospital.

## 2019-06-24 NOTE — Telephone Encounter (Signed)
LEFT MESSAGE TO CALL BACK FOR TOC FOLLOW UP QUESTION

## 2019-06-25 ENCOUNTER — Telehealth (HOSPITAL_COMMUNITY): Payer: Self-pay

## 2019-06-25 NOTE — Telephone Encounter (Signed)
Attempted to call patient in regards to Cardiac Rehab - LM on VM 

## 2019-06-25 NOTE — Telephone Encounter (Signed)
Pt insurance is active and benefits verified through Medicare A/B. Co-pay $0.00, DED $198.00/$198.00 met, out of pocket $0.00/$0.00 met, co-insurance 20%. No pre-authorization required. Passport, 06/25/2019 @ 10:47AM, EXO#[id number]  2ndary insurance is active and benefits verified through [employer]. Co-pay $0.00, DED $0.00/$0.00 met, out of pocket $0.00/$0.00 met, co-insurance 0%. No pre-authorization required. Passport, 06/25/2019 @ 10:50AM, TCK#[id number]  Will contact patient to see if she is interested in the Cardiac Rehab Program. If interested, patient will need to complete follow up appt. Once completed, patient will be contacted for scheduling upon review by the RN Navigator.

## 2019-06-25 NOTE — Telephone Encounter (Signed)
Pt returned CR phone call and stated she is interested in the Cardiac Rehab Program. Explained scheduling process and went over insurance, patient verbalized understanding. Will contact patient for scheduling once f/u has been completed.

## 2019-06-25 NOTE — Telephone Encounter (Signed)
No answer. Left message to call back on both numbers.  

## 2019-06-29 NOTE — Telephone Encounter (Signed)
Third attempted to contact patient regarding appointment-  LVM advising patient to come tomorrow 11/04 at 8:30 AM -  Will remove from triage pool.

## 2019-06-30 ENCOUNTER — Encounter: Payer: Self-pay | Admitting: Cardiology

## 2019-06-30 ENCOUNTER — Other Ambulatory Visit: Payer: Self-pay

## 2019-06-30 ENCOUNTER — Ambulatory Visit (INDEPENDENT_AMBULATORY_CARE_PROVIDER_SITE_OTHER): Payer: Medicare Other | Admitting: Cardiology

## 2019-06-30 ENCOUNTER — Telehealth: Payer: Self-pay | Admitting: Cardiovascular Disease

## 2019-06-30 VITALS — BP 135/89 | HR 84 | Ht 63.0 in | Wt 253.6 lb

## 2019-06-30 DIAGNOSIS — G8929 Other chronic pain: Secondary | ICD-10-CM

## 2019-06-30 DIAGNOSIS — Z9861 Coronary angioplasty status: Secondary | ICD-10-CM

## 2019-06-30 DIAGNOSIS — I2510 Atherosclerotic heart disease of native coronary artery without angina pectoris: Secondary | ICD-10-CM

## 2019-06-30 DIAGNOSIS — Z86718 Personal history of other venous thrombosis and embolism: Secondary | ICD-10-CM

## 2019-06-30 DIAGNOSIS — E785 Hyperlipidemia, unspecified: Secondary | ICD-10-CM

## 2019-06-30 DIAGNOSIS — E782 Mixed hyperlipidemia: Secondary | ICD-10-CM | POA: Diagnosis not present

## 2019-06-30 DIAGNOSIS — I10 Essential (primary) hypertension: Secondary | ICD-10-CM

## 2019-06-30 DIAGNOSIS — I214 Non-ST elevation (NSTEMI) myocardial infarction: Secondary | ICD-10-CM | POA: Diagnosis not present

## 2019-06-30 DIAGNOSIS — N1831 Chronic kidney disease, stage 3a: Secondary | ICD-10-CM

## 2019-06-30 NOTE — Assessment & Plan Note (Signed)
Lifelong Xarelto 

## 2019-06-30 NOTE — Telephone Encounter (Signed)
Left a message for the patient to call back.  

## 2019-06-30 NOTE — Assessment & Plan Note (Signed)
Statin added- check lipids CMET in 6 weeks.

## 2019-06-30 NOTE — Telephone Encounter (Signed)
New Message  Pt c/o medication issue:  1. Name of Medication: Wellbutrin and Citalopram    2. How are you currently taking this medication (dosage and times per day)? Have not taken these medications in over 2 years  3. Are you having a reaction (difficulty breathing--STAT)? No  4. What is your medication issue? Patient would like these removed from her medication list.

## 2019-06-30 NOTE — Assessment & Plan Note (Signed)
Controlled- severe LVH on echo 

## 2019-06-30 NOTE — Assessment & Plan Note (Signed)
S/P multiple abdominal surgeries- followed by PCP

## 2019-06-30 NOTE — Progress Notes (Signed)
Cardiology Office Note:    Date:  06/30/2019   ID:  Mary Ferguson, DOB 1950-10-25, MRN [id number]  PCP:  Lajean Manes, MD  Cardiologist:  Sanda Klein, MD  Electrophysiologist:  None   Referring MD: Lajean Manes, MD   No chief complaint on file. Post hospital f/u  History of Present Illness:    Mary Ferguson is a pleasant, morbidly obese, Caucasian 68 y.o. female with a hx of chronic abdominal and back pain who presented 06/17/2019 with multiple complaints including chest pain.  Her Troponin came back elevated (12,617).  Cath done 06/18/2019 showed RCA disease with high grade mRCA stenosis and high grade dRCA stenosis.  She was treated  with PCI/ DES.  She had residual disease in the LAD of 70%, the plan is for medical Rx.  Her LVF was 40% at cath but her echo showed her EF to be 60-65%.  She is in the office today for follow up.  She is in a wheel chair.  Apparently there was an effort to get her CIR before discharge but she didn't qualify. From a cardiac standpoint she is doing well.  It was decided to discharge her on Plavix and Xarelto (no ASA).  She has not had recurrent chest pain.   Past Medical History:  Diagnosis Date  . Asthma   . Chronic generalized abdominal pain   . Chronic pain   . CKD (chronic kidney disease), stage III   . Hypertension   . Morbid obesity ([place])   . Recurrent deep vein thrombosis (DVT) (HCC)     Past Surgical History:  Procedure Laterality Date  . CORONARY STENT INTERVENTION N/A 06/18/2019   Procedure: CORONARY STENT INTERVENTION;  Surgeon: Wellington Hampshire, MD;  Location: [facility];  Service: Cardiovascular;  Laterality: N/A;  . EYE SURGERY    . HERNIA REPAIR    . LEFT HEART CATH AND CORONARY ANGIOGRAPHY N/A 06/18/2019   Procedure: LEFT HEART CATH AND CORONARY ANGIOGRAPHY;  Surgeon: Wellington Hampshire, MD;  Location: [facility];  Service: Cardiovascular;  Laterality: N/A;  . VEIN SURGERY      Current Medications: Current  Meds  Medication Sig  . atorvastatin (LIPITOR) 80 MG tablet Take 1 tablet (80 mg total) by mouth daily.  . carvedilol (COREG) 3.125 MG tablet Take 1 tablet (3.125 mg total) by mouth 2 (two) times daily with a meal.  . clobetasol ointment (TEMOVATE) 4.09 % Apply 1 application topically 2 (two) times daily.  . clopidogrel (PLAVIX) 75 MG tablet Take 1 tablet (75 mg total) by mouth daily with breakfast.  . dicyclomine (BENTYL) 20 MG tablet Take 20 mg by mouth 3 (three) times daily as needed for spasms.  . diphenoxylate-atropine (LOMOTIL) 2.5-0.025 MG tablet Take 1 tablet by mouth 4 (four) times daily as needed for diarrhea or loose stools.  . hydrOXYzine (ATARAX/VISTARIL) 25 MG tablet Take 25 mg by mouth 2 (two) times daily as needed for itching.   . metroNIDAZOLE (METROCREAM) 0.75 % cream Apply 1 application topically 2 (two) times daily.  . nitroGLYCERIN (NITROSTAT) 0.4 MG SL tablet Place 1 tablet (0.4 mg total) under the tongue every 5 (five) minutes x 3 doses as needed for chest pain.  . promethazine (PHENERGAN) 25 MG tablet Take 25 mg by mouth daily as needed for nausea.   . psyllium (METAMUCIL SMOOTH TEXTURE) 28 % packet Take 1 packet by mouth 2 (two) times daily.  . rivaroxaban (XARELTO) 10 MG TABS tablet Take 10 mg  by mouth every evening.     Allergies:   Rofecoxib, Tetracyclines & related, Benadryl allergy [diphenhydramine hcl], Cephalexin, and Gatifloxacin   Social History   Socioeconomic History  . Marital status: Widowed    Spouse name: Not on file  . Number of children: Not on file  . Years of education: Not on file  . Highest education level: Not on file  Occupational History  . Not on file  Social Needs  . Financial resource strain: Not on file  . Food insecurity    Worry: Not on file    Inability: Not on file  . Transportation needs    Medical: Not on file    Non-medical: Not on file  Tobacco Use  . Smoking status: Former Games developermoker  . Smokeless tobacco: Never Used   Substance and Sexual Activity  . Alcohol use: Never    Frequency: Never  . Drug use: Never  . Sexual activity: Not on file  Lifestyle  . Physical activity    Days per week: Not on file    Minutes per session: Not on file  . Stress: Not on file  Relationships  . Social Musicianconnections    Talks on phone: Not on file    Gets together: Not on file    Attends religious service: Not on file    Active member of club or organization: Not on file    Attends meetings of clubs or organizations: Not on file    Relationship status: Not on file  Other Topics Concern  . Not on file  Social History Narrative  . Not on file     Family History: The patient's family history is not on file.  ROS:   Please see the history of present illness.     All other systems reviewed and are negative.  EKGs/Labs/Other Studies Reviewed:    The following studies were reviewed today: Cath/ PCI 06/17/2019 Echo 06/18/2019  Recent Labs: 06/17/2019: B Natriuretic Peptide 172.0 06/18/2019: ALT 19 06/20/2019: BUN 10; Creatinine, Ser 1.12; Potassium 3.7; Sodium 139 06/22/2019: Hemoglobin 14.3; Platelets 355  Recent Lipid Panel    Component Value Date/Time   CHOL 165 06/18/2019 0348   TRIG 146 06/18/2019 0348   HDL 32 (L) 06/18/2019 0348   CHOLHDL 5.2 06/18/2019 0348   VLDL 29 06/18/2019 0348   LDLCALC 104 (H) 06/18/2019 0348    Physical Exam:    VS:  BP 135/89   Pulse 84   Ht 5\' 3"  (1.6 m)   Wt 253 lb 9.6 oz (115 kg)   BMI 44.92 kg/m     Wt Readings from Last 3 Encounters:  06/30/19 253 lb 9.6 oz (115 kg)  06/22/19 252 lb 12.8 oz (114.7 kg)  09/29/18 248 lb (112.5 kg)     GEN: Obese Caucasian female, well developed in no acute distress, in wheelchair.  HEENT: Normal NECK: No JVD; No carotid bruits LYMPHATICS: No lymphadenopathy CARDIAC: RRR, no murmurs, rubs, gallops RESPIRATORY:  Clear to auscultation without rales, wheezing or rhonchi  ABDOMEN: Soft, non-tender,  non-distended MUSCULOSKELETAL:  No edema; No deformity, mild ecchymosis Rt radial site  SKIN: Warm and dry NEUROLOGIC:  Alert and oriented x 3 PSYCHIATRIC:  Normal affect   ASSESSMENT:    NSTEMI (non-ST elevated myocardial infarction) (HCC) Pt presented 06/17/2019 with NSTEMI  CAD S/P PCI 2 site RCA PCI with DES 06/18/2019. Residual 70% LAD- medical Rx.  Normal LVF by echo  History of DVT of lower extremity Lifelong Xarelto  CKD (  chronic kidney disease), stage III GFR 51  Essential hypertension Controlled- severe LVH on echo  Chronic pain S/P multiple abdominal surgeries- followed by PCP  Morbid obesity (HCC) BMI 45  Dyslipidemia, goal LDL below 70 Statin added- check lipids CMET in 6 weeks.   PLAN:    Check lipids and CMET in 6 week, f/u with me 6 weeks later.    Medication Adjustments/Labs and Tests Ordered: Current medicines are reviewed at length with the patient today.  Concerns regarding medicines are outlined above.  Orders Placed This Encounter  Procedures  . Comprehensive Metabolic Panel (CMET)  . Lipid panel   No orders of the defined types were placed in this encounter.   Patient Instructions  Medication Instructions:  Your physician recommends that you continue on your current medications as directed. Please refer to the Current Medication list given to you today. *If you need a refill on your cardiac medications before your next appointment, please call your pharmacy*  Lab Work: Your physician recommends that you return for lab work in: 6 WEEKS FASTING LIPIDS, CMET If you have labs (blood work) drawn today and your tests are completely normal, you will receive your results only by: Marland Kitchen MyChart Message (if you have MyChart) OR . A paper copy in the mail If you have any lab test that is abnormal or we need to change your treatment, we will call you to review the results.  Testing/Procedures: NONE   Follow-Up: At [facility], you and your  health needs are our priority.  As part of our continuing mission to provide you with exceptional heart care, we have created designated Provider Care Teams.  These Care Teams include your primary Cardiologist (physician) and Advanced Practice Providers (APPs -  Physician Assistants and Nurse Practitioners) who all work together to provide you with the care you need, when you need it.  Your next appointment:   3 months  The format for your next appointment:   In Person  Provider:   Corine Shelter, PA-C  Other Instructions     Signed, Corine Shelter, PA-C  06/30/2019 2:34 PM    [facility]

## 2019-06-30 NOTE — Assessment & Plan Note (Signed)
GFR 51 

## 2019-06-30 NOTE — Assessment & Plan Note (Signed)
Pt presented 06/17/2019 with NSTEMI 

## 2019-06-30 NOTE — Assessment & Plan Note (Signed)
2 site RCA PCI with DES 06/18/2019. Residual 70% LAD- medical Rx.  Normal LVF by echo

## 2019-06-30 NOTE — Assessment & Plan Note (Signed)
BMI 45 

## 2019-06-30 NOTE — Patient Instructions (Addendum)
Medication Instructions:  Your physician recommends that you continue on your current medications as directed. Please refer to the Current Medication list given to you today. *If you need a refill on your cardiac medications before your next appointment, please call your pharmacy*  Lab Work: Your physician recommends that you return for lab work in: [place], CMET If you have labs (blood work) drawn today and your tests are completely normal, you will receive your results only by: Marland Kitchen MyChart Message (if you have MyChart) OR . A paper copy in the mail If you have any lab test that is abnormal or we need to change your treatment, we will call you to review the results.  Testing/Procedures: NONE   Follow-Up: At [facility], you and your health needs are our priority.  As part of our continuing mission to provide you with exceptional heart care, we have created designated Provider Care Teams.  These Care Teams include your primary Cardiologist (physician) and Advanced Practice Providers (APPs -  Physician Assistants and Nurse Practitioners) who all work together to provide you with the care you need, when you need it.  Your next appointment:   3 months  The format for your next appointment:   In Person  Provider:   Kerin Ransom, PA-C  Other Instructions

## 2019-07-01 NOTE — Telephone Encounter (Signed)
[id number] 

## 2019-07-02 NOTE — Telephone Encounter (Signed)
Left message for patient that medication list has been updated. Encouraged patient to bring a list to next appointment.

## 2019-07-06 ENCOUNTER — Other Ambulatory Visit: Payer: Self-pay

## 2019-07-06 ENCOUNTER — Encounter: Payer: Self-pay | Admitting: Physical Therapy

## 2019-07-06 ENCOUNTER — Ambulatory Visit: Payer: Medicare Other | Attending: Geriatric Medicine | Admitting: Physical Therapy

## 2019-07-06 DIAGNOSIS — R262 Difficulty in walking, not elsewhere classified: Secondary | ICD-10-CM | POA: Diagnosis present

## 2019-07-06 DIAGNOSIS — M6281 Muscle weakness (generalized): Secondary | ICD-10-CM | POA: Diagnosis not present

## 2019-07-06 DIAGNOSIS — R2681 Unsteadiness on feet: Secondary | ICD-10-CM

## 2019-07-06 DIAGNOSIS — M545 Low back pain, unspecified: Secondary | ICD-10-CM

## 2019-07-06 DIAGNOSIS — G8929 Other chronic pain: Secondary | ICD-10-CM | POA: Diagnosis present

## 2019-07-06 NOTE — Therapy (Signed)
[facility] [address] [place], [place], [address] Phone: [phone]   Fax:  [phone]  Physical Therapy Evaluation  Patient Details  Name: Mary Ferguson MRN: [id number] Date of Birth: 1951/01/01 Referring Provider (PT): Merlene Laughter   Encounter Date: 07/06/2019  PT End of Session - 07/06/19 1029    Visit Number  1    Date for PT Re-Evaluation  09/05/19    PT Start Time  0935    PT Stop Time  1030    PT Time Calculation (min)  55 min    Activity Tolerance  Patient tolerated treatment well    Behavior During Therapy  Anxious;WFL for tasks assessed/performed       Past Medical History:  Diagnosis Date  . Asthma   . Chronic generalized abdominal pain   . Chronic pain   . CKD (chronic kidney disease), stage III   . Hypertension   . Morbid obesity (HCC)   . Recurrent deep vein thrombosis (DVT) (HCC)     Past Surgical History:  Procedure Laterality Date  . CORONARY STENT INTERVENTION N/A 06/18/2019   Procedure: CORONARY STENT INTERVENTION;  Surgeon: Iran Ouch, MD;  Location: [facility];  Service: Cardiovascular;  Laterality: N/A;  . EYE SURGERY    . HERNIA REPAIR    . LEFT HEART CATH AND CORONARY ANGIOGRAPHY N/A 06/18/2019   Procedure: LEFT HEART CATH AND CORONARY ANGIOGRAPHY;  Surgeon: Iran Ouch, MD;  Location: [facility];  Service: Cardiovascular;  Laterality: N/A;  . VEIN SURGERY      There were no vitals filed for this visit.   Subjective Assessment - 07/06/19 0939    Subjective  Patient had an MI in October and was admitted to the hospital and reports a stay of 6 days.  She reports a long and varied history of medical issues.  She reports a past history of multiple falls.  She reports that she feels very weak  She reports that she had to stop and rest a few times to get into our building.    Pertinent History  asthma, DVT, HTN, CKD    Limitations   Lifting;Standing;Walking;House hold activities    How long can you walk comfortably?  difficulty has to rest often 2 rests in 100 feet    Patient Stated Goals  be stronger, feel better, have less pain    Currently in Pain?  Yes    Pain Score  7     Pain Location  Back   abdomen and legs, c/o pain "all over"   Pain Descriptors / Indicators  Aching    Pain Type  Chronic pain    Pain Onset  More than a month ago    Pain Frequency  Constant    Aggravating Factors   activity and reports that it just comes and goes pain up to 10/10    Pain Relieving Factors  reports lies on left side can feel a little better pain at best is a 7-8/10    Effect of Pain on Daily Activities  reports that she is limited with all activities         [facility] PT Assessment - 07/06/19 0001      Assessment   Medical Diagnosis  weakness, pain, difficulty walking    Referring Provider (PT)  Hal Stoneking    Onset Date/Surgical Date  06/17/19    Prior Therapy  no  Precautions   Precautions  None      Balance Screen   Has the patient fallen in the past 6 months  Yes    How many times?  2    Has the patient had a decrease in activity level because of a fear of falling?   Yes    Is the patient reluctant to leave their home because of a fear of falling?   Yes      Home Environment   Additional Comments  lives with son and daughter in law, has a ramp into the home, has a shower bench, uses a rollator      Prior Function   Level of Independence  Independent with household mobility with device;Needs assistance with ADLs;Needs assistance with homemaking    Vocation  Retired    Leisure  no exercise      Posture/Postural Control   Posture Comments  fwd head, rounded shoulders      ROM / Strength   AROM / PROM / Strength  Strength      Strength   Overall Strength Comments  hips 3+/5, knees 3+/5 ankles 4-/5 all increase pain, shoulders and elbows are 4-/5 with some pain      Palpation   Palpation comment  she  is very tender in the low back and the buttocks      Ambulation/Gait   Gait Comments  [place] with rollator, very slow, very stooped, limited distance to walk      Standardized Balance Assessment   Standardized Balance Assessment  Berg Balance Test;Timed Up and Go Test      Berg Balance Test   Sit to Stand  Able to stand using hands after several tries    Standing Unsupported  Able to stand 30 seconds unsupported    Sitting with Back Unsupported but Feet Supported on Floor or Stool  Able to sit 2 minutes under supervision    Stand to Sit  Uses backs of legs against chair to control descent    Transfers  Able to transfer safely, definite need of hands    Standing Unsupported with Eyes Closed  Needs help to keep from falling    Standing Unsupported with Feet Together  Needs help to attain position and unable to hold for 15 seconds    From Standing, Reach Forward with Outstretched Arm  Can reach forward >5 cm safely (2")    From Standing Position, Pick up Object from Floor  Unable to try/needs assist to keep balance    From Standing Position, Turn to Look Behind Over each Shoulder  Needs supervision when turning    Turn 360 Degrees  Needs assistance while turning    Standing Unsupported, Alternately Place Feet on Step/Stool  Needs assistance to keep from falling or unable to try    Standing Unsupported, One Foot in [employer] Hughes IncorporatedFront  Loses balance while stepping or standing    Standing on One Leg  Unable to try or needs assist to prevent fall    Total Score  15      Timed Up and Go Test   Normal TUG (seconds)  48                Objective measurements completed on examination: See above findings.                PT Short Term Goals - 07/06/19 1040      PT SHORT TERM GOAL #1   Title  indepednent with initial  HEP    Time  2    Period  Weeks    Status  New        PT Long Term Goals - 07/06/19 1040      PT LONG TERM GOAL #1   Title  decrease TUG time to 30 seconds     Time  8    Period  Weeks    Status  New      PT LONG TERM GOAL #2   Title  increase Berg balance test to 35/56    Time  8    Period  Weeks    Status  New      PT LONG TERM GOAL #3   Title  increase LE strength to 4/5    Time  8    Period  Weeks    Status  New      PT LONG TERM GOAL #4   Title  give education about pain and TENS    Time  8    Period  Weeks    Status  New             Plan - 07/06/19 1030    Clinical Impression Statement  Patient has a long history of pain and other medical issues, she was admitted to the hospital in October due to MI.  She reports that she has had 2 falls, she reports multiple falls a few years ago and feels unsteady at times.  She has a lot of c/o pain in the back, abdomen and legs.  Her TUG times was 48 seconds and her Berg balance score was 15/56 putting her at a very high risk for falls.  I do feel that she has a low functioning VOR wiht some of the comments that she made I had her try to do some head turns with eyes fixed and this caused her to feel dizzy    Personal Factors and Comorbidities  Comorbidity 3+    Comorbidities  DVT, HTN, CKD, asthma, chronic pain    Examination-Activity Limitations  Bathing;Squat;Lift;Stairs;Locomotion Level;Stand;Toileting;Transfers    Examination-Participation Restrictions  Laundry;Shop;Community Activity;Yard Work;Meal Prep;Driving    Stability/Clinical Decision Making  Evolving/Moderate complexity    Clinical Decision Making  Moderate    Rehab Potential  Fair    PT Frequency  2x / week    PT Duration  8 weeks    PT Treatment/Interventions  ADLs/Self Care Home Management;Electrical Stimulation;Moist Heat;Functional mobility training;Gait training;Therapeutic activities;Therapeutic exercise;Balance training;Neuromuscular re-education;Manual techniques;Patient/family education    PT Next Visit Plan  slowly work on strength and balance may give VOR exercises    Consulted and Agree with Plan of Care   Patient       Patient will benefit from skilled therapeutic intervention in order to improve the following deficits and impairments:  Abnormal gait, Dizziness, Pain, Postural dysfunction, Decreased mobility, Cardiopulmonary status limiting activity, Decreased activity tolerance, Decreased endurance, Decreased range of motion, Decreased strength, Impaired vision/preception, Difficulty walking, Decreased balance  Visit Diagnosis: Muscle weakness (generalized) - Plan: PT plan of care cert/re-cert  Difficulty in walking, not elsewhere classified - Plan: PT plan of care cert/re-cert  Chronic bilateral low back pain without sciatica - Plan: PT plan of care cert/re-cert  Unsteadiness on feet - Plan: PT plan of care cert/re-cert     Problem List Patient Active Problem List   Diagnosis Date Noted  . CAD S/P PCI 06/30/2019  . Dyslipidemia, goal LDL below 70 06/30/2019  . Status post coronary artery stent  placement   . Recurrent deep vein thrombosis (DVT) (HCC)   . Morbid obesity (HCC)   . Essential hypertension   . CKD (chronic kidney disease), stage III   . Chronic pain   . Chronic generalized abdominal pain   . NSTEMI (non-ST elevated myocardial infarction) (HCC) 06/17/2019  . History of DVT of lower extremity 09/29/2018    Jearld Lesch., PT 07/06/2019, 10:44 AM  [facility] [address] [place], [place], [address] Phone: [phone]   Fax:  [phone]  Name: Mary Ferguson MRN: [id number] Date of Birth: 1950/09/06

## 2019-07-06 NOTE — Patient Instructions (Signed)
Access Code: [email or web address]  URL: [email or web address]  Date: 07/06/2019  Prepared by: Lum Babe   Exercises Seated Long Arc Quad - 10 reps - 1 sets - 1 hold - 3x daily - 7x weekly Seated March - 10 reps - 1 sets - 1 hold - 3x daily - 7x weekly Seated Heel Toe Raises - 10 reps - 1 sets - 1 hold - 3x daily - 7x weekly

## 2019-07-08 ENCOUNTER — Ambulatory Visit: Payer: Medicare Other | Admitting: Physical Therapy

## 2019-07-08 ENCOUNTER — Encounter: Payer: Self-pay | Admitting: Physical Therapy

## 2019-07-08 ENCOUNTER — Other Ambulatory Visit: Payer: Self-pay

## 2019-07-08 DIAGNOSIS — R262 Difficulty in walking, not elsewhere classified: Secondary | ICD-10-CM

## 2019-07-08 DIAGNOSIS — M6281 Muscle weakness (generalized): Secondary | ICD-10-CM | POA: Diagnosis not present

## 2019-07-08 DIAGNOSIS — R2681 Unsteadiness on feet: Secondary | ICD-10-CM

## 2019-07-08 DIAGNOSIS — G8929 Other chronic pain: Secondary | ICD-10-CM

## 2019-07-08 DIAGNOSIS — M545 Low back pain, unspecified: Secondary | ICD-10-CM

## 2019-07-08 NOTE — Therapy (Signed)
[place], [place], [address] Phone: [phone]   Fax:  [phone]  Physical Therapy Treatment  Patient Details  Name: Mary Ferguson MRN: [id number] Date of Birth: 10/02/1950 Referring Provider (PT): Lajean Manes   Encounter Date: 07/08/2019  PT End of Session - 07/08/19 0840    Visit Number  2    Date for PT Re-Evaluation  09/05/19    PT Start Time  0802    PT Stop Time  0842    PT Time Calculation (min)  40 min    Activity Tolerance  Patient tolerated treatment well    Behavior During Therapy  Anxious;WFL for tasks assessed/performed       Past Medical History:  Diagnosis Date  . Asthma   . Chronic generalized abdominal pain   . Chronic pain   . CKD (chronic kidney disease), stage III   . Hypertension   . Morbid obesity ([place])   . Recurrent deep vein thrombosis (DVT) ([facility])     Past Surgical History:  Procedure Laterality Date  . CORONARY STENT INTERVENTION N/A 06/18/2019   Procedure: CORONARY STENT INTERVENTION;  Surgeon: Wellington Hampshire, MD;  Location: [facility];  Service: Cardiovascular;  Laterality: N/A;  . EYE SURGERY    . HERNIA REPAIR    . LEFT HEART CATH AND CORONARY ANGIOGRAPHY N/A 06/18/2019   Procedure: LEFT HEART CATH AND CORONARY ANGIOGRAPHY;  Surgeon: Wellington Hampshire, MD;  Location: [facility];  Service: Cardiovascular;  Laterality: N/A;  . VEIN SURGERY      There were no vitals filed for this visit.  Subjective Assessment - 07/08/19 0801    Subjective  Pt reports that she lives in a 7 to 8 pain    Currently in Pain?  Yes    Pain Score  7     Pain Location  Abdomen   & Back                      [facility] Adult PT Treatment/Exercise - 07/08/19 0001      Exercises   Exercises  Lumbar      Lumbar Exercises: Aerobic   Nustep  L3 x 6 min       Lumbar Exercises: Standing   Row  Strengthening;Both;20 reps;Theraband     Theraband Level (Row)  Level 2 (Red)    Shoulder Extension  Strengthening;Both;20 reps;Theraband    Theraband Level (Shoulder Extension)  Level 2 (Red)      Lumbar Exercises: Seated   Long Arc Quad on Chair  Strengthening;Both;2 sets;10 reps    LAQ on Chair Weights (lbs)  5    Other Seated Lumbar Exercises  Seated March 2lb 3x10 total, OHP red ball 2x10     Other Seated Lumbar Exercises  HS cursl red 2x10 each; Ball squeezes 2x10                PT Short Term Goals - 07/08/19 0843      PT SHORT TERM GOAL #1   Title  indepednent with initial HEP    Status  Partially Met        PT Long Term Goals - 07/06/19 1040      PT LONG TERM GOAL #1   Title  decrease TUG time to 30 seconds    Time  8    Period  Weeks    Status  New  PT LONG TERM GOAL #2   Title  increase Berg balance test to 35/56    Time  8    Period  Weeks    Status  New      PT LONG TERM GOAL #3   Title  increase LE strength to 4/5    Time  8    Period  Weeks    Status  New      PT LONG TERM GOAL #4   Title  give education about pain and TENS    Time  8    Period  Weeks    Status  New            Plan - 07/08/19 8325    Clinical Impression Statement  Pt tolerated an initial progression to TE well. No reports of increase pain from the exercises. Pt becomes fatigue with activity requiring multiples rest breaks. Pt is slow transferring from sit to stand with bilateral UE use. Postural cues needed with standing rows and extensions. Cues to complete the full ROM with HS curls.    Examination-Activity Limitations  Bathing;Squat;Lift;Stairs;Locomotion Level;Stand;Toileting;Transfers    Examination-Participation Restrictions  Laundry;Shop;Community Activity;Yard Work;Meal Prep;Driving    Stability/Clinical Decision Making  Evolving/Moderate complexity    Rehab Potential  Fair    PT Frequency  2x / week    PT Duration  8 weeks    PT Treatment/Interventions  ADLs/Self Care Home Management;Electrical  Stimulation;Moist Heat;Functional mobility training;Gait training;Therapeutic activities;Therapeutic exercise;Balance training;Neuromuscular re-education;Manual techniques;Patient/family education    PT Next Visit Plan  slowly work on strength and balance may give VOR exercises       Patient will benefit from skilled therapeutic intervention in order to improve the following deficits and impairments:  Abnormal gait, Dizziness, Pain, Postural dysfunction, Decreased mobility, Cardiopulmonary status limiting activity, Decreased activity tolerance, Decreased endurance, Decreased range of motion, Decreased strength, Impaired vision/preception, Difficulty walking, Decreased balance  Visit Diagnosis: Muscle weakness (generalized)  Difficulty in walking, not elsewhere classified  Chronic bilateral low back pain without sciatica  Unsteadiness on feet     Problem List Patient Active Problem List   Diagnosis Date Noted  . CAD S/P PCI 06/30/2019  . Dyslipidemia, goal LDL below 70 06/30/2019  . Status post coronary artery stent placement   . Recurrent deep vein thrombosis (DVT) ([place])   . Morbid obesity ([place])   . Essential hypertension   . CKD (chronic kidney disease), stage III   . Chronic pain   . Chronic generalized abdominal pain   . NSTEMI (non-ST elevated myocardial infarction) ([place]) 06/17/2019  . History of DVT of lower extremity 09/29/2018    Scot Jun, PTA 07/08/2019, 8:43 AM  [place], [place], [address] Phone: [phone]   Fax:  [phone]  Name: Eldora Napp MRN: [id number] Date of Birth: 1951/08/11

## 2019-07-13 ENCOUNTER — Other Ambulatory Visit: Payer: Self-pay

## 2019-07-13 ENCOUNTER — Encounter: Payer: Self-pay | Admitting: Physical Therapy

## 2019-07-13 ENCOUNTER — Ambulatory Visit: Payer: Medicare Other | Admitting: Physical Therapy

## 2019-07-13 DIAGNOSIS — M6281 Muscle weakness (generalized): Secondary | ICD-10-CM | POA: Diagnosis not present

## 2019-07-13 DIAGNOSIS — R262 Difficulty in walking, not elsewhere classified: Secondary | ICD-10-CM

## 2019-07-13 DIAGNOSIS — G8929 Other chronic pain: Secondary | ICD-10-CM

## 2019-07-13 DIAGNOSIS — R2681 Unsteadiness on feet: Secondary | ICD-10-CM

## 2019-07-13 DIAGNOSIS — M545 Low back pain, unspecified: Secondary | ICD-10-CM

## 2019-07-13 NOTE — Therapy (Signed)
[place], [place], [address] Phone: [phone]   Fax:  [phone]  Physical Therapy Treatment  Patient Details  Name: Mary Ferguson MRN: [id number] Date of Birth: 05/30/1951 Referring Provider (PT): Hal Stoneking   Encounter Date: 07/13/2019  PT End of Session - 07/13/19 0837    Visit Number  3    Date for PT Re-Evaluation  09/05/19    PT Start Time  0800    PT Stop Time  0840    PT Time Calculation (min)  40 min    Activity Tolerance  Patient tolerated treatment well    Behavior During Therapy  Anxious;WFL for tasks assessed/performed       Past Medical History:  Diagnosis Date  . Asthma   . Chronic generalized abdominal pain   . Chronic pain   . CKD (chronic kidney disease), stage III   . Hypertension   . Morbid obesity ([place])   . Recurrent deep vein thrombosis (DVT) ([facility])     Past Surgical History:  Procedure Laterality Date  . CORONARY STENT INTERVENTION N/A 06/18/2019   Procedure: CORONARY STENT INTERVENTION;  Surgeon: Wellington Hampshire, MD;  Location: [facility];  Service: Cardiovascular;  Laterality: N/A;  . EYE SURGERY    . HERNIA REPAIR    . LEFT HEART CATH AND CORONARY ANGIOGRAPHY N/A 06/18/2019   Procedure: LEFT HEART CATH AND CORONARY ANGIOGRAPHY;  Surgeon: Wellington Hampshire, MD;  Location: [facility];  Service: Cardiovascular;  Laterality: N/A;  . VEIN SURGERY      There were no vitals filed for this visit.  Subjective Assessment - 07/13/19 0803    Subjective  Patient reports no new issues, still hurting in the back and the abdomen    Currently in Pain?  Yes    Pain Score  7     Pain Location  Back   abdomen   Aggravating Factors   activity                       [facility] Adult PT Treatment/Exercise - 07/13/19 0001      Lumbar Exercises: Aerobic   UBE (Upper Arm Bike)  level 1 4 minutes    Nustep  L3 x 6 min       Lumbar  Exercises: Standing   Row  Strengthening;Both;20 reps;Theraband    Theraband Level (Row)  Level 2 (Red)    Shoulder Extension  Strengthening;Both;20 reps;Theraband    Theraband Level (Shoulder Extension)  Level 2 (Red)      Lumbar Exercises: Seated   Long Arc Quad on Chair  Strengthening;Both;2 sets;10 reps    LAQ on Chair Weights (lbs)  5    Other Seated Lumbar Exercises  Seated March 2lb 3x10 total, OHP red ball 2x10  all seated exercises today on the sit fit for lumbar /pelvic stability, did some pelvic mobility    Other Seated Lumbar Exercises  HS cursl red 2x10 each; Ball squeezes 2x10 , physio ball in lap isometric abs               PT Short Term Goals - 07/13/19 0840      PT SHORT TERM GOAL #1   Title  indepednent with initial HEP    Status  Partially Met        PT Long Term Goals - 07/06/19 1040      PT LONG TERM GOAL #1  Title  decrease TUG time to 30 seconds    Time  8    Period  Weeks    Status  New      PT LONG TERM GOAL #2   Title  increase Berg balance test to 35/56    Time  8    Period  Weeks    Status  New      PT LONG TERM GOAL #3   Title  increase LE strength to 4/5    Time  8    Period  Weeks    Status  New      PT LONG TERM GOAL #4   Title  give education about pain and TENS    Time  8    Period  Weeks    Status  New            Plan - 07/13/19 8871    Clinical Impression Statement  Patient doing well, goes slow with the exercises, some verbal and tactile cues to get end range motions and for posture, especially while on the sit fit.  Added a little more today and she did well, just goes slow    PT Next Visit Plan  slowly work on strength and balance may give VOR exercises    Consulted and Agree with Plan of Care  Patient       Patient will benefit from skilled therapeutic intervention in order to improve the following deficits and impairments:  Abnormal gait, Dizziness, Pain, Postural dysfunction, Decreased mobility,  Cardiopulmonary status limiting activity, Decreased activity tolerance, Decreased endurance, Decreased range of motion, Decreased strength, Impaired vision/preception, Difficulty walking, Decreased balance  Visit Diagnosis: Muscle weakness (generalized)  Difficulty in walking, not elsewhere classified  Chronic bilateral low back pain without sciatica  Unsteadiness on feet     Problem List Patient Active Problem List   Diagnosis Date Noted  . CAD S/P PCI 06/30/2019  . Dyslipidemia, goal LDL below 70 06/30/2019  . Status post coronary artery stent placement   . Recurrent deep vein thrombosis (DVT) ([place])   . Morbid obesity ([place])   . Essential hypertension   . CKD (chronic kidney disease), stage III   . Chronic pain   . Chronic generalized abdominal pain   . NSTEMI (non-ST elevated myocardial infarction) ([place]) 06/17/2019  . History of DVT of lower extremity 09/29/2018    Sumner Boast., PT 07/13/2019, 8:41 AM  [place], [place], [address] Phone: [phone]   Fax:  [phone]  Name: Mary Ferguson MRN: [id number] Date of Birth: [birth year]

## 2019-07-16 ENCOUNTER — Ambulatory Visit: Payer: Medicare Other | Admitting: Physical Therapy

## 2019-07-16 ENCOUNTER — Other Ambulatory Visit: Payer: Self-pay

## 2019-07-16 DIAGNOSIS — M6281 Muscle weakness (generalized): Secondary | ICD-10-CM

## 2019-07-16 NOTE — Therapy (Signed)
[place], [place], [address] Phone: [phone]   Fax:  [phone]  Physical Therapy Treatment  Patient Details  Name: Mary Ferguson MRN: [id number] Date of Birth: Aug 05, 1951 Referring Provider (PT): Lajean Manes   Encounter Date: 07/16/2019  PT End of Session - 07/16/19 0842    Visit Number  4    PT Start Time  0800    PT Stop Time  0842    PT Time Calculation (min)  42 min       Past Medical History:  Diagnosis Date  . Asthma   . Chronic generalized abdominal pain   . Chronic pain   . CKD (chronic kidney disease), stage III   . Hypertension   . Morbid obesity ([place])   . Recurrent deep vein thrombosis (DVT) ([facility])     Past Surgical History:  Procedure Laterality Date  . CORONARY STENT INTERVENTION N/A 06/18/2019   Procedure: CORONARY STENT INTERVENTION;  Surgeon: Wellington Hampshire, MD;  Location: [facility];  Service: Cardiovascular;  Laterality: N/A;  . EYE SURGERY    . HERNIA REPAIR    . LEFT HEART CATH AND CORONARY ANGIOGRAPHY N/A 06/18/2019   Procedure: LEFT HEART CATH AND CORONARY ANGIOGRAPHY;  Surgeon: Wellington Hampshire, MD;  Location: [facility];  Service: Cardiovascular;  Laterality: N/A;  . VEIN SURGERY      There were no vitals filed for this visit.  Subjective Assessment - 07/16/19 0806    Subjective  pt states she is having pain in her back.    Currently in Pain?  Yes    Pain Score  8     Pain Location  Back                       [facility] Adult PT Treatment/Exercise - 07/16/19 0001      Lumbar Exercises: Aerobic   UBE (Upper Arm Bike)  level 1 4 minutes    Nustep  L3 x 6 min       Lumbar Exercises: Standing   Row  Strengthening;Both;Theraband;20 reps;10 reps    Theraband Level (Row)  Level 2 (Red)      Lumbar Exercises: Seated   Long Arc Quad on Chair  Strengthening;Both;2 sets;10 reps    LAQ on Chair Weights (lbs)  5    Other  Seated Lumbar Exercises  Seated March 3lb 2x10     Other Seated Lumbar Exercises  HS cursl red 2x15 each; Ball squeezes 2x10, hip abduction 2 x 10 red tband               PT Short Term Goals - 07/16/19 0810      PT SHORT TERM GOAL #1   Title  indepednent with initial HEP        PT Long Term Goals - 07/16/19 0810      PT LONG TERM GOAL #1   Title  decrease TUG time to 30 seconds    Status  On-going      PT LONG TERM GOAL #2   Title  increase Berg balance test to 35/56    Status  On-going      PT LONG TERM GOAL #3   Title  increase LE strength to 4/5    Status  Partially Met      PT LONG TERM GOAL #4   Title  give education about pain and TENS  Status  On-going            Plan - 07/16/19 0845    Clinical Impression Statement  pt making progress toward TUG goal with a time of 37 seconds today. pt able to increase reps with rows. pt able to increase weight with seated marching. pt needs tactile cues with marching and LAQ to improve form and technique. pt complains of back pain with marching and LAQ.    Personal Factors and Comorbidities  Comorbidity 3+    Comorbidities  DVT, HTN, CKD, asthma, chronic pain    Examination-Activity Limitations  Bathing;Squat;Lift;Stairs;Locomotion Level;Stand;Toileting;Transfers    Examination-Participation Restrictions  Laundry;Shop;Community Activity;Yard Work;Meal Prep;Driving    Stability/Clinical Decision Making  Evolving/Moderate complexity    Rehab Potential  Fair    PT Frequency  2x / week    PT Duration  8 weeks    PT Treatment/Interventions  ADLs/Self Care Home Management;Electrical Stimulation;Moist Heat;Functional mobility training;Gait training;Therapeutic activities;Therapeutic exercise;Balance training;Neuromuscular re-education;Manual techniques;Patient/family education    PT Next Visit Plan  continue to work on strength and start balance exercises       Patient will benefit from skilled therapeutic intervention  in order to improve the following deficits and impairments:  Abnormal gait, Dizziness, Pain, Postural dysfunction, Decreased mobility, Cardiopulmonary status limiting activity, Decreased activity tolerance, Decreased endurance, Decreased range of motion, Decreased strength, Impaired vision/preception, Difficulty walking, Decreased balance  Visit Diagnosis: Muscle weakness (generalized)     Problem List Patient Active Problem List   Diagnosis Date Noted  . CAD S/P PCI 06/30/2019  . Dyslipidemia, goal LDL below 70 06/30/2019  . Status post coronary artery stent placement   . Recurrent deep vein thrombosis (DVT) ([place])   . Morbid obesity ([place])   . Essential hypertension   . CKD (chronic kidney disease), stage III   . Chronic pain   . Chronic generalized abdominal pain   . NSTEMI (non-ST elevated myocardial infarction) ([place]) 06/17/2019  . History of DVT of lower extremity 09/29/2018    Barrett Henle, [place] 07/16/2019, 8:50 AM  [place], [place], [address] Phone: [phone]   Fax:  [phone]  Name: Mary Ferguson MRN: [id number] Date of Birth: 08/08/1951

## 2019-07-20 ENCOUNTER — Ambulatory Visit: Payer: Medicare Other | Admitting: Physical Therapy

## 2019-08-02 ENCOUNTER — Encounter: Payer: Medicare Other | Admitting: Physical Therapy

## 2019-08-04 ENCOUNTER — Encounter: Payer: Medicare Other | Admitting: Physical Therapy

## 2019-08-10 ENCOUNTER — Ambulatory Visit: Payer: Medicare Other | Attending: Geriatric Medicine | Admitting: Physical Therapy

## 2019-08-10 ENCOUNTER — Other Ambulatory Visit: Payer: Self-pay

## 2019-08-10 DIAGNOSIS — R2681 Unsteadiness on feet: Secondary | ICD-10-CM | POA: Diagnosis present

## 2019-08-10 DIAGNOSIS — M6281 Muscle weakness (generalized): Secondary | ICD-10-CM | POA: Diagnosis present

## 2019-08-10 DIAGNOSIS — R262 Difficulty in walking, not elsewhere classified: Secondary | ICD-10-CM | POA: Diagnosis present

## 2019-08-10 NOTE — Therapy (Signed)
[place], [place], [address] Phone: [phone]   Fax:  [phone]  Physical Therapy Treatment  Patient Details  Name: Mary Ferguson MRN: [id number] Date of Birth: 01-17-1951 Referring Provider (PT): Lajean Manes   Encounter Date: 08/10/2019  PT End of Session - 08/10/19 0845    Visit Number  5    Date for PT Re-Evaluation  09/05/19    PT Start Time  0800    PT Stop Time  6286    PT Time Calculation (min)  55 min       Past Medical History:  Diagnosis Date  . Asthma   . Chronic generalized abdominal pain   . Chronic pain   . CKD (chronic kidney disease), stage III   . Hypertension   . Morbid obesity ([place])   . Recurrent deep vein thrombosis (DVT) ([facility])     Past Surgical History:  Procedure Laterality Date  . CORONARY STENT INTERVENTION N/A 06/18/2019   Procedure: CORONARY STENT INTERVENTION;  Surgeon: Wellington Hampshire, MD;  Location: [facility];  Service: Cardiovascular;  Laterality: N/A;  . EYE SURGERY    . HERNIA REPAIR    . LEFT HEART CATH AND CORONARY ANGIOGRAPHY N/A 06/18/2019   Procedure: LEFT HEART CATH AND CORONARY ANGIOGRAPHY;  Surgeon: Wellington Hampshire, MD;  Location: [facility];  Service: Cardiovascular;  Laterality: N/A;  . VEIN SURGERY      There were no vitals filed for this visit.  Subjective Assessment - 08/10/19 0805    Subjective  pt states dealing with sinus in infection past 1 1/2 week. this morning hit RT little toe and may of broke it " i was eager to get back"    Currently in Pain?  Yes    Pain Score  7     Pain Location  Back                       [facility] Adult PT Treatment/Exercise - 08/10/19 0001      Lumbar Exercises: Stretches   Active Hamstring Stretch  Right;Left;3 reps;10 seconds      Lumbar Exercises: Aerobic   UBE (Upper Arm Bike)  L 2 2 min fwd/2 back    Nustep  L3 x 6 min       Lumbar Exercises: Machines  for Strengthening   Cybex Lumbar Extension  5# 2 sets 10    Cybex Knee Extension  15# 10x, 20# 10x    Other Lumbar Machine Exercise  seated row 20# 2 sets 10    Other Lumbar Machine Exercise  black tband trunk ext 2 sets 10      Lumbar Exercises: Standing   Other Standing Lumbar Exercises  hip ext and abd BIL 10 each with UE support      Modalities   Modalities  Electrical Stimulation;Moist Heat      Moist Heat Therapy   Number Minutes Moist Heat  15 Minutes    Moist Heat Location  Lumbar Spine      Electrical Stimulation   Electrical Stimulation Location  LBP    Electrical Stimulation Action  IFC    Electrical Stimulation Parameters  seated    Electrical Stimulation Goals  Pain               PT Short Term Goals - 08/10/19 0808      PT SHORT TERM GOAL #1  Title  indepednent with initial HEP    Status  Achieved        PT Long Term Goals - 08/10/19 0809      PT LONG TERM GOAL #1   Title  decrease TUG time to 30 seconds    Baseline  29.8  sec with rollator    Status  Achieved      PT LONG TERM GOAL #2   Title  increase Berg balance test to 35/56    Status  On-going      PT LONG TERM GOAL #3   Title  increase LE strength to 4/5    Status  Partially Met      PT LONG TERM GOAL #4   Title  give education about pain and TENS    Status  On-going            Plan - 08/10/19 0845    Clinical Impression Statement  TUG goal met. Trial of estim in clinic as pt had only tried 1 x in past and then will assess for TENS ( pt tolersated very high outpt 32). Added add'l strengthening ex and she did very well but did require postural cuing.    PT Treatment/Interventions  ADLs/Self Care Home Management;Electrical Stimulation;Moist Heat;Functional mobility training;Gait training;Therapeutic activities;Therapeutic exercise;Balance training;Neuromuscular re-education;Manual techniques;Patient/family education    PT Next Visit Plan  postural and core strength.        Patient will benefit from skilled therapeutic intervention in order to improve the following deficits and impairments:  Abnormal gait, Dizziness, Pain, Postural dysfunction, Decreased mobility, Cardiopulmonary status limiting activity, Decreased activity tolerance, Decreased endurance, Decreased range of motion, Decreased strength, Impaired vision/preception, Difficulty walking, Decreased balance  Visit Diagnosis: Difficulty in walking, not elsewhere classified  Muscle weakness (generalized)  Unsteadiness on feet     Problem List Patient Active Problem List   Diagnosis Date Noted  . CAD S/P PCI 06/30/2019  . Dyslipidemia, goal LDL below 70 06/30/2019  . Status post coronary artery stent placement   . Recurrent deep vein thrombosis (DVT) ([place])   . Morbid obesity ([place])   . Essential hypertension   . CKD (chronic kidney disease), stage III   . Chronic pain   . Chronic generalized abdominal pain   . NSTEMI (non-ST elevated myocardial infarction) ([place]) 06/17/2019  . History of DVT of lower extremity 09/29/2018    Kaedance Magos,ANGIE PTA 08/10/2019, 8:48 AM  [place], [place], [address] Phone: [phone]   Fax:  [phone]  Name: Zyanne Schumm MRN: [id number] Date of Birth: 1951/05/19

## 2019-08-13 ENCOUNTER — Ambulatory Visit: Payer: Medicare Other | Admitting: Physical Therapy

## 2019-08-25 ENCOUNTER — Encounter: Payer: Medicare Other | Admitting: Physical Therapy

## 2019-08-31 ENCOUNTER — Encounter: Payer: Self-pay | Admitting: Physical Therapy

## 2019-08-31 ENCOUNTER — Ambulatory Visit: Payer: Medicare Other | Attending: Geriatric Medicine | Admitting: Physical Therapy

## 2019-08-31 ENCOUNTER — Other Ambulatory Visit: Payer: Self-pay

## 2019-08-31 DIAGNOSIS — R2681 Unsteadiness on feet: Secondary | ICD-10-CM | POA: Diagnosis present

## 2019-08-31 DIAGNOSIS — G8929 Other chronic pain: Secondary | ICD-10-CM | POA: Diagnosis present

## 2019-08-31 DIAGNOSIS — R262 Difficulty in walking, not elsewhere classified: Secondary | ICD-10-CM

## 2019-08-31 DIAGNOSIS — M545 Low back pain, unspecified: Secondary | ICD-10-CM

## 2019-08-31 DIAGNOSIS — M6281 Muscle weakness (generalized): Secondary | ICD-10-CM | POA: Diagnosis present

## 2019-08-31 NOTE — Therapy (Signed)
[place], [place], [address] Phone: [phone]   Fax:  [phone]  Physical Therapy Treatment  Patient Details  Name: Mary Ferguson MRN: [id number] Date of Birth: 04/24/1951 Referring Provider (PT): Hal Stoneking   Encounter Date: 08/31/2019  PT End of Session - 08/31/19 0835    Visit Number  6    Date for PT Re-Evaluation  09/05/19    PT Start Time  0800    PT Stop Time  0845    PT Time Calculation (min)  45 min    Activity Tolerance  Patient tolerated treatment well    Behavior During Therapy  Anxious       Past Medical History:  Diagnosis Date  . Asthma   . Chronic generalized abdominal pain   . Chronic pain   . CKD (chronic kidney disease), stage III   . Hypertension   . Morbid obesity ([place])   . Recurrent deep vein thrombosis (DVT) ([facility])     Past Surgical History:  Procedure Laterality Date  . CORONARY STENT INTERVENTION N/A 06/18/2019   Procedure: CORONARY STENT INTERVENTION;  Surgeon: Wellington Hampshire, MD;  Location: [facility];  Service: Cardiovascular;  Laterality: N/A;  . EYE SURGERY    . HERNIA REPAIR    . LEFT HEART CATH AND CORONARY ANGIOGRAPHY N/A 06/18/2019   Procedure: LEFT HEART CATH AND CORONARY ANGIOGRAPHY;  Surgeon: Wellington Hampshire, MD;  Location: [facility];  Service: Cardiovascular;  Laterality: N/A;  . VEIN SURGERY      There were no vitals filed for this visit.  Subjective Assessment - 08/31/19 0802    Subjective  Pt reports some painful nodules in her L leg    Currently in Pain?  Yes    Pain Score  7     Pain Location  --   abdomen, back, and leg        [facility] PT Assessment - 08/31/19 0001      Ambulation/Gait   Gait Comments  [place] with rollator, very slow, very stooped, limited distance to walk                   [facility] Adult PT Treatment/Exercise - 08/31/19 0001      Lumbar Exercises: Aerobic   Nustep  L3 x 7 min        Lumbar Exercises: Machines for Strengthening   Cybex Lumbar Extension  10lb 2x10     Cybex Knee Extension  20lb 2x0     Other Lumbar Machine Exercise  seated row & Lats 20# 2 sets 10    Other Lumbar Machine Exercise  black tband trunk ext 2 sets 10      Lumbar Exercises: Standing   Row  Strengthening;Both;Theraband;20 reps;10 reps    Theraband Level (Row)  Level 3 Nyoka Cowden)               PT Short Term Goals - 08/10/19 0808      PT SHORT TERM GOAL #1   Title  indepednent with initial HEP    Status  Achieved        PT Long Term Goals - 08/10/19 0809      PT LONG TERM GOAL #1   Title  decrease TUG time to 30 seconds    Baseline  29.8  sec with rollator    Status  Achieved      PT LONG TERM GOAL #2  Title  increase Berg balance test to 35/56    Status  On-going      PT LONG TERM GOAL #3   Title  increase LE strength to 4/5    Status  Partially Met      PT LONG TERM GOAL #4   Title  give education about pain and TENS    Status  On-going            Plan - 08/31/19 0836    Clinical Impression Statement  Pt express that the potential of her back going out and hurting is limiting her with function and mobility. She reports that at time she is unable to control the pain. To day she did well with the interventions. Postural cues needed with seated rows and lats. Increase weight tolerated with leg extensions. She did require cues to complete each intervention with full ROM. Some postural swaying with standing rows noted, cues needed to correct.    Comorbidities  DVT, HTN, CKD, asthma, chronic pain    Examination-Activity Limitations  Bathing;Squat;Lift;Stairs;Locomotion Level;Stand;Toileting;Transfers    Examination-Participation Restrictions  Laundry;Shop;Community Activity;Yard Work;Meal Prep;Driving    Stability/Clinical Decision Making  Evolving/Moderate complexity    Rehab Potential  Fair    PT Frequency  2x / week    PT Duration  8 weeks    PT  Treatment/Interventions  ADLs/Self Care Home Management;Electrical Stimulation;Moist Heat;Functional mobility training;Gait training;Therapeutic activities;Therapeutic exercise;Balance training;Neuromuscular re-education;Manual techniques;Patient/family education    PT Next Visit Plan  postural and core strength.       Patient will benefit from skilled therapeutic intervention in order to improve the following deficits and impairments:  Abnormal gait, Dizziness, Pain, Postural dysfunction, Decreased mobility, Cardiopulmonary status limiting activity, Decreased activity tolerance, Decreased endurance, Decreased range of motion, Decreased strength, Impaired vision/preception, Difficulty walking, Decreased balance  Visit Diagnosis: Difficulty in walking, not elsewhere classified  Unsteadiness on feet  Muscle weakness (generalized)  Chronic bilateral low back pain without sciatica     Problem List Patient Active Problem List   Diagnosis Date Noted  . CAD S/P PCI 06/30/2019  . Dyslipidemia, goal LDL below 70 06/30/2019  . Status post coronary artery stent placement   . Recurrent deep vein thrombosis (DVT) ([place])   . Morbid obesity ([place])   . Essential hypertension   . CKD (chronic kidney disease), stage III   . Chronic pain   . Chronic generalized abdominal pain   . NSTEMI (non-ST elevated myocardial infarction) ([place]) 06/17/2019  . History of DVT of lower extremity 09/29/2018    Scot Jun 08/31/2019, 8:46 AM  [place] [address] [place], [place], [address] Phone: [phone]   Fax:  [phone]  Name: Mary Ferguson MRN: [id number] Date of Birth: Jul 22, 1951

## 2019-09-02 ENCOUNTER — Ambulatory Visit: Payer: Medicare Other | Admitting: Physical Therapy

## 2019-09-02 ENCOUNTER — Other Ambulatory Visit: Payer: Self-pay

## 2019-09-02 DIAGNOSIS — R262 Difficulty in walking, not elsewhere classified: Secondary | ICD-10-CM

## 2019-09-02 DIAGNOSIS — G8929 Other chronic pain: Secondary | ICD-10-CM

## 2019-09-02 DIAGNOSIS — R2681 Unsteadiness on feet: Secondary | ICD-10-CM

## 2019-09-02 DIAGNOSIS — M6281 Muscle weakness (generalized): Secondary | ICD-10-CM

## 2019-09-02 DIAGNOSIS — M545 Low back pain, unspecified: Secondary | ICD-10-CM

## 2019-09-02 NOTE — Therapy (Signed)
[place], [place], [address] Phone: [phone]   Fax:  [phone]  Physical Therapy Treatment  Patient Details  Name: Mary Ferguson MRN: [id number] Date of Birth: 1950-09-23 Referring Provider (PT): Hal Stoneking   Encounter Date: 09/02/2019  PT End of Session - 09/02/19 0830    Visit Number  7    Date for PT Re-Evaluation  09/05/19    PT Start Time  0758    PT Stop Time  0840    PT Time Calculation (min)  42 min       Past Medical History:  Diagnosis Date  . Asthma   . Chronic generalized abdominal pain   . Chronic pain   . CKD (chronic kidney disease), stage III   . Hypertension   . Morbid obesity ([place])   . Recurrent deep vein thrombosis (DVT) (HCC)     Past Surgical History:  Procedure Laterality Date  . CORONARY STENT INTERVENTION N/A 06/18/2019   Procedure: CORONARY STENT INTERVENTION;  Surgeon: Wellington Hampshire, MD;  Location: [facility];  Service: Cardiovascular;  Laterality: N/A;  . EYE SURGERY    . HERNIA REPAIR    . LEFT HEART CATH AND CORONARY ANGIOGRAPHY N/A 06/18/2019   Procedure: LEFT HEART CATH AND CORONARY ANGIOGRAPHY;  Surgeon: Wellington Hampshire, MD;  Location: [facility];  Service: Cardiovascular;  Laterality: N/A;  . VEIN SURGERY      There were no vitals filed for this visit.  Subjective Assessment - 09/02/19 0802    Subjective  I think PT is helping but after I got home last time I hurt so bad in my abdominals    Currently in Pain?  Yes    Pain Score  8     Pain Location  Back                       [facility] Adult PT Treatment/Exercise - 09/02/19 0001      Lumbar Exercises: Aerobic   UBE (Upper Arm Bike)  L 2 2 min fwd/2 back    Nustep  L4 x 7 min       Lumbar Exercises: Machines for Strengthening   Cybex Lumbar Extension  black tband 2 set s10    Cybex Knee Extension  10 # 2x0     Cybex Knee Flexion  20# 2 set s10     Other Lumbar Machine Exercise  seated row & Lats 20# 2 sets 10      Lumbar Exercises: Seated   Other Seated Lumbar Exercises  ball squeeze 20 x    Other Seated Lumbar Exercises  green tband hip abd 20 x   isometric abdominal 2 sets 10              PT Short Term Goals - 08/10/19 2863      PT SHORT TERM GOAL #1   Title  indepednent with initial HEP    Status  Achieved        PT Long Term Goals - 09/02/19 8177      PT LONG TERM GOAL #1   Title  decrease TUG time to 30 seconds    Baseline  34.8  sec with rollator, very slow with turns    Status  Partially Met      PT LONG TERM GOAL #2   Title  increase Berg balance test to 35/56    Status  Partially Met      PT LONG TERM GOAL #3   Title  increase LE strength to 4/5    Status  Partially Met            Plan - 09/02/19 0828    Clinical Impression Statement  pt very slow with mvmt today. minimal conversation and had to ask alot of questions to get answers re: pain and goals. cuing with ex for control and posture. TUG score increased since 3 weeks ago- very slow with turns    PT Treatment/Interventions  ADLs/Self Care Home Management;Electrical Stimulation;Moist Heat;Functional mobility training;Gait training;Therapeutic activities;Therapeutic exercise;Balance training;Neuromuscular re-education;Manual techniques;Patient/family education    PT Next Visit Plan  next visit BERG and renewal due       Patient will benefit from skilled therapeutic intervention in order to improve the following deficits and impairments:  Abnormal gait, Dizziness, Pain, Postural dysfunction, Decreased mobility, Cardiopulmonary status limiting activity, Decreased activity tolerance, Decreased endurance, Decreased range of motion, Decreased strength, Impaired vision/preception, Difficulty walking, Decreased balance  Visit Diagnosis: Unsteadiness on feet  Difficulty in walking, not elsewhere classified  Muscle weakness  (generalized)  Chronic bilateral low back pain without sciatica     Problem List Patient Active Problem List   Diagnosis Date Noted  . CAD S/P PCI 06/30/2019  . Dyslipidemia, goal LDL below 70 06/30/2019  . Status post coronary artery stent placement   . Recurrent deep vein thrombosis (DVT) ([place])   . Morbid obesity ([place])   . Essential hypertension   . CKD (chronic kidney disease), stage III   . Chronic pain   . Chronic generalized abdominal pain   . NSTEMI (non-ST elevated myocardial infarction) ([place]) 06/17/2019  . History of DVT of lower extremity 09/29/2018    Saquan Furtick,ANGIE PTA 09/02/2019, 8:31 AM  [place] [address] [place], [place], [address] Phone: [phone]   Fax:  [phone]  Name: Francis Yardley MRN: [id number] Date of Birth: 1951-01-31

## 2019-09-07 ENCOUNTER — Other Ambulatory Visit: Payer: Self-pay

## 2019-09-07 ENCOUNTER — Ambulatory Visit: Payer: Medicare Other | Admitting: Physical Therapy

## 2019-09-07 ENCOUNTER — Encounter: Payer: Self-pay | Admitting: Physical Therapy

## 2019-09-07 DIAGNOSIS — R262 Difficulty in walking, not elsewhere classified: Secondary | ICD-10-CM

## 2019-09-07 DIAGNOSIS — G8929 Other chronic pain: Secondary | ICD-10-CM

## 2019-09-07 DIAGNOSIS — M6281 Muscle weakness (generalized): Secondary | ICD-10-CM

## 2019-09-07 DIAGNOSIS — R2681 Unsteadiness on feet: Secondary | ICD-10-CM

## 2019-09-07 DIAGNOSIS — M545 Low back pain, unspecified: Secondary | ICD-10-CM

## 2019-09-07 NOTE — Therapy (Signed)
[facility] [place] [address] [place], [place], [address] Phone: [phone]   Fax:  [phone]  Physical Therapy Treatment  Patient Details  Name: Mary Ferguson MRN: [id number] Date of Birth: [birth year] Referring Provider (PT): Hal Stoneking   Encounter Date: 09/07/2019  PT End of Session - 09/07/19 0839    Visit Number  8    Date for PT Re-Evaluation  09/05/19    PT Start Time  0758    PT Stop Time  0843    PT Time Calculation (min)  45 min    Activity Tolerance  Patient tolerated treatment well    Behavior During Therapy  Anxious       Past Medical History:  Diagnosis Date  . Asthma   . Chronic generalized abdominal pain   . Chronic pain   . CKD (chronic kidney disease), stage III   . Hypertension   . Morbid obesity ([facility])   . Recurrent deep vein thrombosis (DVT) ([facility])     Past Surgical History:  Procedure Laterality Date  . CORONARY STENT INTERVENTION N/A 06/18/2019   Procedure: CORONARY STENT INTERVENTION;  Surgeon: Iran Ouch, MD;  [facility];  Service: Cardiovascular;  Laterality: N/A;  . EYE SURGERY    . HERNIA REPAIR    . LEFT HEART CATH AND CORONARY ANGIOGRAPHY N/A 06/18/2019   Procedure: LEFT HEART CATH AND CORONARY ANGIOGRAPHY;  Surgeon: Iran Ouch, MD;  [facility];  Service: Cardiovascular;  Laterality: N/A;  . VEIN SURGERY      There were no vitals filed for this visit.  Subjective Assessment - 09/07/19 0800    Subjective  "I just have not felt good" "I am just pushing through"    Pertinent History  asthma, DVT, HTN, CKD    Limitations  Lifting;Standing;Walking;House hold activities    Currently in Pain?  Yes    Pain Score  7     Pain Location  Back         [facility] PT Assessment - 09/07/19 0001      Berg Balance Test   Sit to Stand  Able to stand  independently using hands    Standing Unsupported  Able to stand 30 seconds unsupported     Sitting with Back Unsupported but Feet Supported on Floor or Stool  Able to sit safely and securely 2 minutes    Stand to Sit  Uses backs of legs against chair to control descent    Transfers  Able to transfer safely, definite need of hands    Standing Unsupported with Eyes Closed  Able to stand 3 seconds    Standing Unsupported with Feet Together  Able to place feet together independently and stand for 1 minute with supervision    From Standing, Reach Forward with Outstretched Arm  Can reach forward >5 cm safely (2")    From Standing Position, Pick up Object from Floor  Unable to try/needs assist to keep balance    From Standing Position, Turn to Look Behind Over each Shoulder  Needs supervision when turning    Turn 360 Degrees  Needs assistance while turning    Standing Unsupported, Alternately Place Feet on Step/Stool  Needs assistance to keep from falling or unable to try    Standing Unsupported, One Foot in Front  Loses balance while stepping or standing    Standing on One Leg  Unable to try or needs assist to prevent  fall    Total Score  22      Timed Up and Go Test   Normal TUG (seconds)  40                   [facility] Adult PT Treatment/Exercise - 09/07/19 0001      Lumbar Exercises: Aerobic   Nustep  L4 x 7 min       Lumbar Exercises: Machines for Strengthening   Cybex Knee Extension  10 # 2x0     Cybex Knee Flexion  20# 2 set s10      Lumbar Exercises: Seated   Other Seated Lumbar Exercises  ball squeeze 20 x               PT Short Term Goals - 08/10/19 0808      PT SHORT TERM GOAL #1   Title  indepednent with initial HEP    Status  Achieved        PT Long Term Goals - 09/07/19 9562      PT LONG TERM GOAL #1   Title  decrease TUG time to 30 seconds    Baseline  40  sec with rollator, very slow with turns      PT LONG TERM GOAL #2   Title  increase Berg balance test to 35/56    Baseline  22/56            Plan - 09/07/19 0839     Clinical Impression Statement  Tug score increase from last week. She has progressed and increase her BERG score 7 points but still remains at a high risk for falls. Cues to complete full ROM with seated curls and extensions.    Comorbidities  DVT, HTN, CKD, asthma, chronic pain    Examination-Activity Limitations  Bathing;Squat;Lift;Stairs;Locomotion Level;Stand;Toileting;Transfers    Examination-Participation Restrictions  Laundry;Shop;Community Activity;Yard Work;Meal Prep;Driving    Stability/Clinical Decision Making  Evolving/Moderate complexity    Rehab Potential  Fair    PT Duration  8 weeks    PT Treatment/Interventions  ADLs/Self Care Home Management;Electrical Stimulation;Moist Heat;Functional mobility training;Gait training;Therapeutic activities;Therapeutic exercise;Balance training;Neuromuscular re-education;Manual techniques;Patient/family education    PT Next Visit Plan  renewal due       Patient will benefit from skilled therapeutic intervention in order to improve the following deficits and impairments:  Abnormal gait, Dizziness, Pain, Postural dysfunction, Decreased mobility, Cardiopulmonary status limiting activity, Decreased activity tolerance, Decreased endurance, Decreased range of motion, Decreased strength, Impaired vision/preception, Difficulty walking, Decreased balance  Visit Diagnosis: Unsteadiness on feet  Difficulty in walking, not elsewhere classified  Muscle weakness (generalized)  Chronic bilateral low back pain without sciatica     Problem List Patient Active Problem List   Diagnosis Date Noted  . CAD S/P PCI 06/30/2019  . Dyslipidemia, goal LDL below 70 06/30/2019  . Status post coronary artery stent placement   . Recurrent deep vein thrombosis (DVT) ([place])   . Morbid obesity ([place])   . Essential hypertension   . CKD (chronic kidney disease), stage III   . Chronic pain   . Chronic generalized abdominal pain   . NSTEMI (non-ST elevated myocardial  infarction) ([place]) 06/17/2019  . History of DVT of lower extremity 09/29/2018    Scot Jun, PTA 09/07/2019, 8:41 AM  [place], [place], [address] Phone: [phone]   Fax:  [phone]  Name: Mary Ferguson MRN: [id number] Date of Birth: 24-Mar-1951

## 2019-09-09 ENCOUNTER — Encounter: Payer: Medicare Other | Admitting: Physical Therapy

## 2019-09-14 ENCOUNTER — Encounter: Payer: Medicare Other | Admitting: Physical Therapy

## 2019-09-16 ENCOUNTER — Encounter: Payer: Medicare Other | Admitting: Physical Therapy

## 2019-09-21 ENCOUNTER — Ambulatory Visit: Payer: Medicare Other | Admitting: Physical Therapy

## 2019-09-21 ENCOUNTER — Encounter: Payer: Self-pay | Admitting: Physical Therapy

## 2019-09-21 ENCOUNTER — Other Ambulatory Visit: Payer: Self-pay

## 2019-09-21 DIAGNOSIS — R262 Difficulty in walking, not elsewhere classified: Secondary | ICD-10-CM

## 2019-09-21 DIAGNOSIS — R2681 Unsteadiness on feet: Secondary | ICD-10-CM

## 2019-09-21 DIAGNOSIS — M6281 Muscle weakness (generalized): Secondary | ICD-10-CM

## 2019-09-21 NOTE — Therapy (Signed)
[facility] [place] [address] [place], [place], [address] Phone: [phone]   Fax:  [phone]  Physical Therapy Treatment  Patient Details  Name: Mary Ferguson MRN: [id number] Date of Birth: [birth year] Referring Provider (PT): Hal Stoneking   Encounter Date: 09/21/2019  PT End of Session - 09/21/19 0840    Visit Number  9    Date for PT Re-Evaluation  10/08/19    PT Start Time  0805    PT Stop Time  0840    PT Time Calculation (min)  35 min    Activity Tolerance  Patient tolerated treatment well    Behavior During Therapy  Anxious;WFL for tasks assessed/performed       Past Medical History:  Diagnosis Date  . Asthma   . Chronic generalized abdominal pain   . Chronic pain   . CKD (chronic kidney disease), stage III   . Hypertension   . Morbid obesity ([facility])   . Recurrent deep vein thrombosis (DVT) ([facility])     Past Surgical History:  Procedure Laterality Date  . CORONARY STENT INTERVENTION N/A 06/18/2019   Procedure: CORONARY STENT INTERVENTION;  Surgeon: Iran Ouch, MD;  [facility];  Service: Cardiovascular;  Laterality: N/A;  . EYE SURGERY    . HERNIA REPAIR    . LEFT HEART CATH AND CORONARY ANGIOGRAPHY N/A 06/18/2019   Procedure: LEFT HEART CATH AND CORONARY ANGIOGRAPHY;  Surgeon: Iran Ouch, MD;  [facility];  Service: Cardiovascular;  Laterality: N/A;  . VEIN SURGERY      There were no vitals filed for this visit.  Subjective Assessment - 09/21/19 0806    Subjective  "I can get my foot out of the bathtub now"    Currently in Pain?  Yes    Pain Score  7    "Good day for me"   Pain Location  Back                       [facility] Adult PT Treatment/Exercise - 09/21/19 0001      High Level Balance   High Level Balance Activities  Side stepping      Lumbar Exercises: Aerobic   Nustep  L4 x 6 min       Lumbar Exercises: Machines for  Strengthening   Cybex Knee Extension  10lb 2x15    Cybex Knee Flexion  25lb 2x10     Other Lumbar Machine Exercise  seated row & Lats 20# 2 sets 10      Lumbar Exercises: Standing   Other Standing Lumbar Exercises  Standing March with rollator.      Lumbar Exercises: Seated   Sit to Stand  5 reps   x3 from elevated UBE               PT Short Term Goals - 08/10/19 0808      PT SHORT TERM GOAL #1   Title  indepednent with initial HEP    Status  Achieved        PT Long Term Goals - 09/07/19 4268      PT LONG TERM GOAL #1   Title  decrease TUG time to 30 seconds    Baseline  40  sec with rollator, very slow with turns      PT LONG TERM GOAL #2   Title  increase Berg balance test to 35/56    Baseline  22/56            Plan - 09/21/19 0841    Clinical Impression Statement  Pt ~ 5 minutes late for today's session. She reports improved functional mobility at home. She did well with all strengthening activities. Cues not to allow LE to push against chair with sit to stand. Increase weight tolerated with seated leg curls.Some instability with sot to stands LOB x 1 CGA needed to correct.    Personal Factors and Comorbidities  Comorbidity 3+    Comorbidities  DVT, HTN, CKD, asthma, chronic pain    Examination-Activity Limitations  Bathing;Squat;Lift;Stairs;Locomotion Level;Stand;Toileting;Transfers    Examination-Participation Restrictions  Laundry;Shop;Community Activity;Yard Work;Meal Prep;Driving    Stability/Clinical Decision Making  Evolving/Moderate complexity    Rehab Potential  Fair    PT Frequency  2x / week    PT Duration  8 weeks       Patient will benefit from skilled therapeutic intervention in order to improve the following deficits and impairments:  Abnormal gait, Dizziness, Pain, Postural dysfunction, Decreased mobility, Cardiopulmonary status limiting activity, Decreased activity tolerance, Decreased endurance, Decreased range of motion, Decreased  strength, Impaired vision/preception, Difficulty walking, Decreased balance  Visit Diagnosis: Difficulty in walking, not elsewhere classified  Muscle weakness (generalized)  Unsteadiness on feet     Problem List Patient Active Problem List   Diagnosis Date Noted  . CAD S/P PCI 06/30/2019  . Dyslipidemia, goal LDL below 70 06/30/2019  . Status post coronary artery stent placement   . Recurrent deep vein thrombosis (DVT) ([place])   . Morbid obesity ([place])   . Essential hypertension   . CKD (chronic kidney disease), stage III   . Chronic pain   . Chronic generalized abdominal pain   . NSTEMI (non-ST elevated myocardial infarction) ([place]) 06/17/2019  . History of DVT of lower extremity 09/29/2018    Scot Jun, PTA 09/21/2019, 8:43 AM  [place], [place], [address] Phone: [phone]   Fax:  [phone]  Name: Mary Ferguson MRN: [id number] Date of Birth: Nov 07, 1950

## 2019-09-23 ENCOUNTER — Ambulatory Visit: Payer: Medicare Other | Admitting: Physical Therapy

## 2019-09-28 ENCOUNTER — Ambulatory Visit: Payer: Medicare Other | Admitting: Physical Therapy

## 2019-10-01 ENCOUNTER — Ambulatory Visit: Payer: Medicare Other | Admitting: Physical Therapy

## 2019-10-04 ENCOUNTER — Ambulatory Visit: Payer: Medicare Other | Admitting: Physical Therapy

## 2019-10-07 ENCOUNTER — Ambulatory Visit: Payer: Medicare Other | Admitting: Physical Therapy

## 2019-10-18 ENCOUNTER — Ambulatory Visit: Payer: Medicare Other | Admitting: Cardiology

## 2019-10-26 LAB — COMPREHENSIVE METABOLIC PANEL
ALT: 13 IU/L (ref 0–32)
AST: 17 IU/L (ref 0–40)
Albumin/Globulin Ratio: 1.4 (ref 1.2–2.2)
Albumin: 4 g/dL (ref 3.8–4.8)
Alkaline Phosphatase: 123 IU/L — ABNORMAL HIGH (ref 39–117)
BUN/Creatinine Ratio: 22 (ref 12–28)
BUN: 24 mg/dL (ref 8–27)
Bilirubin Total: 0.7 mg/dL (ref 0.0–1.2)
CO2: 20 mmol/L (ref 20–29)
Calcium: 9.9 mg/dL (ref 8.7–10.3)
Chloride: 103 mmol/L (ref 96–106)
Creatinine, Ser: 1.07 mg/dL — ABNORMAL HIGH (ref 0.57–1.00)
GFR calc Af Amer: 62 mL/min/{1.73_m2} (ref >59)
GFR calc non Af Amer: 53 mL/min/{1.73_m2} — ABNORMAL LOW (ref >59)
Globulin, Total: 2.8 g/dL (ref 1.5–4.5)
Glucose: 118 mg/dL — ABNORMAL HIGH (ref 65–99)
Potassium: 4.4 mmol/L (ref 3.5–5.2)
Sodium: 140 mmol/L (ref 134–144)
Total Protein: 6.8 g/dL (ref 6.0–8.5)

## 2019-10-26 LAB — LIPID PANEL
Chol/HDL Ratio: 2.9 ratio (ref 0.0–4.4)
Cholesterol, Total: 115 mg/dL (ref 100–199)
HDL: 40 mg/dL (ref >39)
LDL Chol Calc (NIH): 54 mg/dL (ref 0–99)
Triglycerides: 115 mg/dL (ref 0–149)
VLDL Cholesterol Cal: 21 mg/dL (ref 5–40)

## 2019-11-02 ENCOUNTER — Other Ambulatory Visit: Payer: Self-pay

## 2019-11-02 ENCOUNTER — Encounter: Payer: Self-pay | Admitting: Cardiology

## 2019-11-02 ENCOUNTER — Ambulatory Visit (INDEPENDENT_AMBULATORY_CARE_PROVIDER_SITE_OTHER): Payer: Medicare Other | Admitting: Cardiology

## 2019-11-02 VITALS — BP 138/91 | HR 88 | Ht 63.0 in | Wt 243.6 lb

## 2019-11-02 DIAGNOSIS — G8929 Other chronic pain: Secondary | ICD-10-CM | POA: Diagnosis not present

## 2019-11-02 DIAGNOSIS — E785 Hyperlipidemia, unspecified: Secondary | ICD-10-CM

## 2019-11-02 DIAGNOSIS — I10 Essential (primary) hypertension: Secondary | ICD-10-CM

## 2019-11-02 DIAGNOSIS — I2510 Atherosclerotic heart disease of native coronary artery without angina pectoris: Secondary | ICD-10-CM

## 2019-11-02 DIAGNOSIS — Z9861 Coronary angioplasty status: Secondary | ICD-10-CM | POA: Diagnosis not present

## 2019-11-02 DIAGNOSIS — R079 Chest pain, unspecified: Secondary | ICD-10-CM | POA: Diagnosis not present

## 2019-11-02 DIAGNOSIS — N1831 Chronic kidney disease, stage 3a: Secondary | ICD-10-CM

## 2019-11-02 DIAGNOSIS — Z86718 Personal history of other venous thrombosis and embolism: Secondary | ICD-10-CM

## 2019-11-02 MED ORDER — ISOSORBIDE MONONITRATE ER 30 MG PO TB24: 30.0000 mg | ORAL_TABLET | Freq: Every day | ORAL | 3 refills | Status: DC

## 2019-11-02 NOTE — Patient Instructions (Signed)
Medication Instructions:  START ISOSORBIDE 30MG  DAILY If you need a refill on your cardiac medications before your next appointment, please call your pharmacy.  Follow-Up: 6 WEEKS  Virtual Visit  LUKE KILROY, PA-C.    At [facility], you and your health needs are our priority.  As part of our continuing mission to provide you with exceptional heart care, we have created designated Provider Care Teams.  These Care Teams include your primary Cardiologist (physician) and Advanced Practice Providers (APPs -  Physician Assistants and Nurse Practitioners) who all work together to provide you with the care you need, when you need it.  Reduce your risk of getting COVID-19 With your heart disease it is especially important for people at increased risk of severe illness from COVID-19, and those who live with them, to protect themselves from getting COVID-19. The best way to protect yourself and to help reduce the spread of the virus that causes COVID-19 is to: [facility] Limit your interactions with other people as much as possible. . Take COVID-19 when you do interact with others. If you start feeling sick and think you may have COVID-19, get in touch with your healthcare provider within 24 hours.  Thank you for choosing [facility]!!

## 2019-11-02 NOTE — Progress Notes (Signed)
Cardiology Office Note:    Date:  11/02/2019   ID:  [name], DOB June 01, 1951, MRN [id number]  PCP:  Merlene Laughter, MD  Cardiologist:  Thurmon Fair, MD  Electrophysiologist:  None   Referring MD: Merlene Laughter, MD   CC: chest pain, arm pain  History of Present Illness:    Mary Ferguson is a 69 y.o. female with a hx of chronic abdominal and back pain.  She had previously been followed at a pain clinic. She presented 06/17/2019 with multiple complaints including chest pain.  She tells me she thinks she had COVID then despite a negative rapid test as she has had persistent loss of taste and smell and fatigue.   Her work revealed Troponin a HS Troponin of 12k. .  Cath was done 06/18/2019 and showed RCA disease with a high grade mRCA stenosis and high grade dRCA stenosis.  She was treated  with PCI/ DES x 2.  She had residual disease in the mLAD of 70%.  The plan is for medical Rx.  Her LVF was 40% at cath but her echo showed her EF to be 60-65%.  She was seen in November 2020 as a post MI f/u.  She was stable then.  She is seen today as a 3 month f/u.  She had a Lipid panel done which showed her LDL to be 54.  She said she had chest pain two weeks ago that sounds concerning for angina.  She was walking into to a repair shop to pick up her car when she developed "band like" severe chest pain which radiated down her arms and was associated with near syncope and diaphoresis.  This last about 5 minutes.  She was about to take a NTG when her symptoms abated.  This was similar to another episode she had prior to this one.     Past Medical History:  Diagnosis Date  . Asthma   . Chronic generalized abdominal pain   . Chronic pain   . CKD (chronic kidney disease), stage III   . Hypertension   . Morbid obesity (HCC)   . Recurrent deep vein thrombosis (DVT) (HCC)     Past Surgical History:  Procedure Laterality Date  . CORONARY STENT INTERVENTION N/A 06/18/2019   Procedure: CORONARY STENT  INTERVENTION;  Surgeon: Iran Ouch, MD;  Location: [facility];  Service: Cardiovascular;  Laterality: N/A;  . EYE SURGERY    . HERNIA REPAIR    . LEFT HEART CATH AND CORONARY ANGIOGRAPHY N/A 06/18/2019   Procedure: LEFT HEART CATH AND CORONARY ANGIOGRAPHY;  Surgeon: Iran Ouch, MD;  Location: [facility];  Service: Cardiovascular;  Laterality: N/A;  . VEIN SURGERY      Current Medications: Current Meds  Medication Sig  . atorvastatin (LIPITOR) 80 MG tablet Take 1 tablet (80 mg total) by mouth daily.  . carvedilol (COREG) 3.125 MG tablet Take 1 tablet (3.125 mg total) by mouth 2 (two) times daily with a meal.  . clobetasol ointment (TEMOVATE) 0.05 % Apply 1 application topically 2 (two) times daily.  . clopidogrel (PLAVIX) 75 MG tablet Take 1 tablet (75 mg total) by mouth daily with breakfast.  . dicyclomine (BENTYL) 20 MG tablet Take 20 mg by mouth 3 (three) times daily as needed for spasms.  . diphenoxylate-atropine (LOMOTIL) 2.5-0.025 MG tablet Take 1 tablet by mouth 4 (four) times daily as needed for diarrhea or loose stools.  . hydrOXYzine (ATARAX/VISTARIL) 25 MG tablet Take 25  mg by mouth 2 (two) times daily as needed for itching.   . metroNIDAZOLE (METROCREAM) 0.75 % cream Apply 1 application topically 2 (two) times daily.  . nitroGLYCERIN (NITROSTAT) 0.4 MG SL tablet Place 1 tablet (0.4 mg total) under the tongue every 5 (five) minutes x 3 doses as needed for chest pain.  . promethazine (PHENERGAN) 25 MG tablet Take 25 mg by mouth daily as needed for nausea.   . psyllium (METAMUCIL SMOOTH TEXTURE) 28 % packet Take 1 packet by mouth 2 (two) times daily.  . rivaroxaban (XARELTO) 10 MG TABS tablet Take 10 mg by mouth every evening.     Allergies:   Rofecoxib, Tetracyclines & related, Benadryl allergy [diphenhydramine hcl], Cephalexin, and Gatifloxacin   Social History   Socioeconomic History  . Marital status: Widowed    Spouse name: Not on file  .  Number of children: Not on file  . Years of education: Not on file  . Highest education level: Not on file  Occupational History  . Not on file  Tobacco Use  . Smoking status: Former Games developer  . Smokeless tobacco: Never Used  Substance and Sexual Activity  . Alcohol use: Never  . Drug use: Never  . Sexual activity: Not on file  Other Topics Concern  . Not on file  Social History Narrative  . Not on file   Social Determinants of Health   Financial Resource Strain:   . Difficulty of Paying Living Expenses: Not on file  Food Insecurity:   . Worried About Programme researcher, broadcasting/film/video in the Last Year: Not on file  . Ran Out of Food in the Last Year: Not on file  Transportation Needs:   . Lack of Transportation (Medical): Not on file  . Lack of Transportation (Non-Medical): Not on file  Physical Activity:   . Days of Exercise per Week: Not on file  . Minutes of Exercise per Session: Not on file  Stress:   . Feeling of Stress : Not on file  Social Connections:   . Frequency of Communication with Friends and Family: Not on file  . Frequency of Social Gatherings with Friends and Family: Not on file  . Attends Religious Services: Not on file  . Active Member of Clubs or Organizations: Not on file  . Attends Banker Meetings: Not on file  . Marital Status: Not on file     Family History: The patient's family history is not on file.  ROS:   Please see the history of present illness.    Chronic LLE edema after venous ablation and prior DVT - intolerant to lasix. Chronic back pain with recent exacerbation.  All other systems reviewed and are negative.  EKGs/Labs/Other Studies Reviewed:    The following studies were reviewed today: Cath Oct 2020  EKG:  EKG is not ordered today (pt in wheel chair- uncomfortable with back pain).    Recent Labs: 06/17/2019: B Natriuretic Peptide 172.0 06/22/2019: Hemoglobin 14.3; Platelets 355 10/26/2019: ALT 13; BUN 24; Creatinine, Ser  1.07; Potassium 4.4; Sodium 140  Recent Lipid Panel    Component Value Date/Time   CHOL 115 10/26/2019 0811   TRIG 115 10/26/2019 0811   HDL 40 10/26/2019 0811   CHOLHDL 2.9 10/26/2019 0811   CHOLHDL 5.2 06/18/2019 0348   VLDL 29 06/18/2019 0348   LDLCALC 54 10/26/2019 0811    Physical Exam:    VS:  BP (!) 138/91   Pulse 88   Ht 5\' 3"  (  1.6 m)   Wt 243 lb 9.6 oz (110.5 kg)   SpO2 99%   BMI 43.15 kg/m     Wt Readings from Last 3 Encounters:  11/02/19 243 lb 9.6 oz (110.5 kg)  06/30/19 253 lb 9.6 oz (115 kg)  06/22/19 252 lb 12.8 oz (114.7 kg)     GEN: Obese Caucasian female,  in no acute distress, in wheel chair HEENT: Normal NECK: No JVD; No carotid bruits LYMPHATICS: No lymphadenopathy CARDIAC: RRR, no murmurs, rubs, gallops RESPIRATORY:  Clear to auscultation without rales, wheezing or rhonchi  MUSCULOSKELETAL:  1+ LLE  edema; No deformity  SKIN: Multiple tattoos- Warm and dry NEUROLOGIC:  Alert and oriented x 3 PSYCHIATRIC:  Normal affect   ASSESSMENT:    Chest pain with moderate risk of cardiac etiology- Will add po Nitrates  NSTEMI (non-ST elevated myocardial infarction) ([place]) Pt presented 06/17/2019 with NSTEMI  CAD S/P PCI 2 site RCA PCI with DES 06/18/2019. Residual 70% LAD- medical Rx.  Normal LVF by echo  History of DVT of lower extremity Lifelong Xarelto  CKD (chronic kidney disease), stage III GFR 51  Essential hypertension Controlled- severe LVH on echo  Chronic pain S/P multiple abdominal surgeries and a H/O chronic back pain- followed by PCP  Morbid obesity ([place]) BMI 45  Dyslipidemia, goal LDL below 70 Statin added- check lipids CMET in 6 weeks.   PLAN:    Increase anti anginal Rx (though patient reluctant to add another medication) add Imdur 30 mg.  F/U in 4-6 weeks (virtual OK).   Consider an alternative to furosemide the for LLE edema but for now only do one thing at a time secondary to her history of medication  intolerances.    Medication Adjustments/Labs and Tests Ordered: Current medicines are reviewed at length with the patient today.  Concerns regarding medicines are outlined above.  No orders of the defined types were placed in this encounter.  Meds ordered this encounter  Medications  . isosorbide mononitrate (IMDUR) 30 MG 24 hr tablet    Sig: Take 1 tablet (30 mg total) by mouth daily.    Dispense:  30 tablet    Refill:  3    There are no Patient Instructions on file for this visit.   Signed, Kerin Ransom, PA-C  11/02/2019 4:15 PM    [facility]

## 2019-11-15 ENCOUNTER — Other Ambulatory Visit: Payer: Self-pay | Admitting: Gastroenterology

## 2019-11-15 DIAGNOSIS — R1084 Generalized abdominal pain: Secondary | ICD-10-CM

## 2019-11-15 DIAGNOSIS — Z1211 Encounter for screening for malignant neoplasm of colon: Secondary | ICD-10-CM

## 2019-11-15 DIAGNOSIS — K625 Hemorrhage of anus and rectum: Secondary | ICD-10-CM

## 2019-11-15 DIAGNOSIS — K439 Ventral hernia without obstruction or gangrene: Secondary | ICD-10-CM

## 2019-11-24 ENCOUNTER — Telehealth: Payer: Self-pay | Admitting: Cardiology

## 2019-11-24 NOTE — Telephone Encounter (Signed)
If she is still having angina on two agents (carvedilol and isosorbide), I think we should repeat her coronary angiogram. She did have a moderate LAD blockage that we left for medical therapy. We can set her up for heart cath. If she wants to discuss further, add to tomorrow's virtual schedule.

## 2019-11-24 NOTE — Telephone Encounter (Signed)
Contacted patient- she was upset on the phone, stating that since her visit with Franky Macho on 03/09- he gave her Isosorbide to take, but patient states that since she has started this she has noticed that her pain in her chest is increasing, or she more aware of the pain. She states she has difficulty breathing, and she begins to sweat really bad, and she has a tightness in her chest- she states it does come and go, but she tried to go out to the store earlier and it occurred and caused her to be very embarrassed. She states that she did not have nitro with her to take- but she does not believe the Imdur is helping as she is still having these issues. She does not know if she should have it increased or if she should stop taking it since its not helping.   Patient advised I would route message to PA as well as MD to advise on plan of care for her.  She was thankful for my call.

## 2019-11-24 NOTE — Telephone Encounter (Signed)
Returned the call to the patient. She has been set up for a virtual appointment tomorrow with Dr. Royann Shivers to discuss further options.  She did state that she does not feel like her chest pain is any worse. She has been advised that if the pain comes back and is not relieved with nitroglycerin then she should call 911. She has verbalized her understanding.

## 2019-11-24 NOTE — Telephone Encounter (Signed)
New message   Patient has a question about a medication that was prescribed to her by Corine Shelter. She was not sure of the name of the medication. Please call to discuss.

## 2019-11-25 ENCOUNTER — Telehealth (INDEPENDENT_AMBULATORY_CARE_PROVIDER_SITE_OTHER): Payer: Medicare Other | Admitting: Cardiovascular Disease

## 2019-11-25 ENCOUNTER — Encounter: Payer: Self-pay | Admitting: Cardiovascular Disease

## 2019-11-25 ENCOUNTER — Telehealth: Payer: Self-pay | Admitting: *Deleted

## 2019-11-25 VITALS — Ht 63.0 in | Wt 251.0 lb

## 2019-11-25 DIAGNOSIS — Z9861 Coronary angioplasty status: Secondary | ICD-10-CM | POA: Diagnosis not present

## 2019-11-25 DIAGNOSIS — E782 Mixed hyperlipidemia: Secondary | ICD-10-CM

## 2019-11-25 DIAGNOSIS — I2510 Atherosclerotic heart disease of native coronary artery without angina pectoris: Secondary | ICD-10-CM

## 2019-11-25 DIAGNOSIS — I25110 Atherosclerotic heart disease of native coronary artery with unstable angina pectoris: Secondary | ICD-10-CM

## 2019-11-25 DIAGNOSIS — E1169 Type 2 diabetes mellitus with other specified complication: Secondary | ICD-10-CM

## 2019-11-25 DIAGNOSIS — Z86718 Personal history of other venous thrombosis and embolism: Secondary | ICD-10-CM

## 2019-11-25 DIAGNOSIS — E669 Obesity, unspecified: Secondary | ICD-10-CM

## 2019-11-25 DIAGNOSIS — Z01818 Encounter for other preprocedural examination: Secondary | ICD-10-CM

## 2019-11-25 NOTE — Progress Notes (Signed)
Virtual Visit via Telephone Note   This visit type was conducted due to national recommendations for restrictions regarding the COVID-19 Pandemic (e.g. social distancing) in an effort to limit this patient's exposure and mitigate transmission in our community.  Due to her co-morbid illnesses, this patient is at least at moderate risk for complications without adequate follow up.  This format is felt to be most appropriate for this patient at this time.  The patient did not have access to video technology/had technical difficulties with video requiring transitioning to audio format only (telephone).  All issues noted in this document were discussed and addressed.  No physical exam could be performed with this format.  Please refer to the patient's chart for her  consent to telehealth for [facility].   The patient was identified using 2 identifiers.  Date:  11/25/2019   ID:  Mary Ferguson, DOB 18-Jul-1951, MRN [id number]  Patient Location: Home Provider Location: Home  PCP:  Merlene Laughter, MD  Cardiologist:  Thurmon Fair, MD  Electrophysiologist:  None   Evaluation Performed:  Follow-Up Visit  Chief Complaint: Chest pain  History of Present Illness:    Mary Ferguson is a 69 y.o. female with morbid obesity, DM, HTN, mixed hyperlipidemia, history of recurrent DVT, CKD 3a and chronic pain, diagnosed with coronary artery disease when she presented with inferior wall ST segment elevation myocardial infarction in October 2020 and received drug-eluting stents to the mid and distal right coronary artery (Dr. Kirke Corin, resolute Onyx 3.5x26 and 3.0x18).  At that time she was also noted to have a 70% mid LAD stenosis that appeared to be chronic.  She had transient LV dysfunction with an EF of about 40% at the time of LV angiography, 60-65% on follow-up echocardiogram the next day.  At the time of cath LV EDP was mildly elevated at 22 mmHg.  She has chronic pain "from her head to her toes", but has been  experiencing recurrent episodes of concerning chest discomfort.  She describes this as a band constricting the middle of her chest and radiating to both arms.  Sometimes is associated with severe diaphoresis.  Most recently it happened when she walked into [facility] a couple of days ago.  It did appear to improve with rest.  She asked for refill on her nitroglycerin and took a tablet that did appear to alleviate the symptoms further.  She has had 5 or 6 of these events in the last month or so.  This has happened although she was prescribed isosorbide mononitrate at her March 5 appointment with [facility].  She is also taking a beta-blocker.  She does not appear to have exertional dyspnea and definitely denies orthopnea, PND, leg edema, claudication and any focal neurological complaints.  She has not had any bleeding complications on combination of clopidogrel and Xarelto.  She believes that she had coronavirus infection last fall, before her cardiac catheterization, although her test was negative.  She had a lot of flulike side effects with her first moderate vaccine at the beginning of this month and is scheduled to get the next vaccine on April 8.  She bears a diagnosis of CKD stage III.  Her creatinine has been in the 1.0-1.2 range consistently.  She has a history of recurrent DVT and is on chronic treatment with Xarelto.  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  She was on chronic opiate therapy for a while, but her quality of life deteriorated substantially so she stopped taking them and is very afraid of becoming addicted to them in the future.  The patient does not have symptoms concerning for COVID-19 infection (fever, chills, cough, or new shortness of breath).    Past Medical History:  Diagnosis Date  . Asthma   . Chronic generalized  abdominal pain   . Chronic pain   . CKD (chronic kidney disease), stage III   . Hypertension   . Morbid obesity ([place])   . Recurrent deep vein thrombosis (DVT) ([facility])    Past Surgical History:  Procedure Laterality Date  . CORONARY STENT INTERVENTION N/A 06/18/2019   Procedure: CORONARY STENT INTERVENTION;  Surgeon: Wellington Hampshire, MD;  Location: [facility];  Service: Cardiovascular;  Laterality: N/A;  . EYE SURGERY    . HERNIA REPAIR    . LEFT HEART CATH AND CORONARY ANGIOGRAPHY N/A 06/18/2019   Procedure: LEFT HEART CATH AND CORONARY ANGIOGRAPHY;  Surgeon: Wellington Hampshire, MD;  Location: [facility];  Service: Cardiovascular;  Laterality: N/A;  . VEIN SURGERY       Current Meds  Medication Sig  . atorvastatin (LIPITOR) 80 MG tablet Take 1 tablet (80 mg total) by mouth daily.  . carvedilol (COREG) 3.125 MG tablet Take 1 tablet (3.125 mg total) by mouth 2 (two) times daily with a meal.  . clobetasol ointment (TEMOVATE) 5.39 % Apply 1 application topically 2 (two) times daily.  . clopidogrel (PLAVIX) 75 MG tablet Take 1 tablet (75 mg total) by mouth daily with breakfast.  . dicyclomine (BENTYL) 20 MG tablet Take 20 mg by mouth 3 (three) times daily as needed for spasms.  . diphenoxylate-atropine (LOMOTIL) 2.5-0.025 MG tablet Take 1 tablet by mouth 4 (four) times daily as needed for diarrhea or loose stools.  . hydrOXYzine (ATARAX/VISTARIL) 25 MG tablet Take 25 mg by mouth 2 (two) times daily as needed for itching.   . isosorbide mononitrate (IMDUR) 30 MG 24 hr tablet Take 1 tablet (30 mg total) by mouth daily.  . metroNIDAZOLE (METROCREAM) 0.75 % cream Apply 1 application topically 2 (two) times daily.  . nitroGLYCERIN (NITROSTAT) 0.4 MG SL tablet Place 1 tablet (0.4 mg total) under the tongue every 5 (five) minutes x 3 doses as needed for chest pain.  . promethazine (PHENERGAN) 25 MG tablet Take 25 mg by mouth daily as needed for nausea.   . psyllium (METAMUCIL SMOOTH  TEXTURE) 28 % packet Take 1 packet by mouth 2 (two) times daily.  . rivaroxaban (XARELTO) 10 MG TABS tablet Take 10 mg by mouth every evening.     Allergies:   Rofecoxib, Tetracyclines & related, Benadryl allergy [diphenhydramine hcl], Cephalexin, and Gatifloxacin   Social History   Tobacco Use  . Smoking status: Former Research scientist (life sciences)  . Smokeless tobacco: Never Used  Substance Use Topics  . Alcohol use: Never  . Drug use: Never     Family Hx: The patient's family history is not on file.  ROS:   Please see the history of present illness.    All other systems reviewed and are negative.   Prior CV studies:   The following studies were reviewed today:  Cardiac catheterization 06/18/2019  Diagnostic Dominance: Right  Intervention   Implants   Permanent Stent  Stent Resolute Onyx 3.5x26 - [id number] - Implanted  Inventory item: Loma Sousa 9.0W40 Model/Cat number: [id number]  Manufacturer: [employer]  AVE Lot number: [phone]  Device identifier: [id number] Device identifier type: GS1  Area Of Implantation: Mid RCA         Labs/Other Tests and Data Reviewed:    EKG:  No ECG reviewed.  Recent Labs: 06/17/2019: B Natriuretic Peptide 172.0 06/22/2019: Hemoglobin 14.3; Platelets 355 10/26/2019: ALT 13; BUN 24; Creatinine, Ser 1.07; Potassium 4.4; Sodium 140   Recent Lipid Panel Lab Results  Component Value Date/Time   CHOL 115 10/26/2019 08:11 AM   TRIG 115 10/26/2019 08:11 AM   HDL 40 10/26/2019 08:11 AM   CHOLHDL 2.9 10/26/2019 08:11 AM   CHOLHDL 5.2 06/18/2019 03:48 AM   LDLCALC 54 10/26/2019 08:11 AM    Wt Readings from Last 3 Encounters:  11/25/19 251 lb (113.9 kg)  11/02/19 243 lb 9.6 oz (110.5 kg)  06/30/19 253 lb 9.6 oz (115 kg)     Objective:    Vital Signs:  Ht 5\' 3"  (1.6 m)   Wt 251 lb (113.9 kg)   BMI 44.46 kg/m    VITAL SIGNS:  reviewed Unable to examine  ASSESSMENT & PLAN:    1. CAD: Mary Ferguson is describing  episodes of exertional angina pectoris with minimal activity, even though she is taking 2 antianginal medications.  Is possible she has restenosis in one of the 2 stents implanted in the right coronary system, although it is also possible that her angina is related to the mid LAD stenosis.  I have recommended repeat coronary angiography.  If the RCA stents are patent I would recommend PCI-stent to the mid LAD.  She will hold her Xarelto for 2 days before angiography.  She will continue taking the Plavix and should take aspirin on the day of the procedure.  Continue beta-blocker and long-acting nitrate. This procedure has been fully reviewed with the patient and informed consent has been obtained. 2. HLP: Excellent lipid profile on current statin regimen with LDL cholesterol of 54.  Even the HDL has improved since last October, although it remains fairly low at 40. 3. DM: She reports good glycemic control.  The most recent hemoglobin A1c was from last October at 6.5%.  She has borderline abnormalities in renal function (GFR in 50-60 mL/minute range). 4. Hx of Recurrent DVT: We will hold Xarelto for 2 days before the diagnostic/possible PCI cardiac catheterization.  Clopidogrel is the preferred antiplatelet agent to reduce bleeding risk. 5. Morbid obesity  COVID-19 Education: The signs and symptoms of COVID-19 were discussed with the patient and how to seek care for testing (follow up with PCP or arrange E-visit).  The importance of social distancing was discussed today.  Time:   Today, I have spent 30 minutes with the patient with telehealth technology discussing the above problems.     Medication Adjustments/Labs and Tests Ordered: Current medicines are reviewed at length with the patient today.  Concerns regarding medicines are outlined above.   Tests Ordered: Orders Placed This Encounter  Procedures  . Basic metabolic panel  . CBC    Medication Changes: No orders of the defined types were  placed in this encounter.  Patient Instructions  Medication Instructions:  No changes *If you need a refill on your cardiac medications before your next appointment, please call your pharmacy*   Testing/Procedures: Your physician has requested that you have a cardiac catheterization. Cardiac catheterization is used to diagnose and/or treat various heart conditions. Doctors may recommend this procedure for a number of different reasons. The most common reason is to evaluate  chest pain. Chest pain can be a symptom of coronary artery disease (CAD), and cardiac catheterization can show whether plaque is narrowing or blocking your heart's arteries. This procedure is also used to evaluate the valves, as well as measure the blood flow and oxygen levels in different parts of your heart. For further information please visit [email or web address] Please follow instruction sheet, as given.   Follow-Up: At [facility], you and your health needs are our priority.  As part of our continuing mission to provide you with exceptional heart care, we have created designated Provider Care Teams.  These Care Teams include your primary Cardiologist (physician) and Advanced Practice Providers (APPs -  Physician Assistants and Nurse Practitioners) who all work together to provide you with the care you need, when you need it.  We recommend signing up for the patient portal called "MyChart".  Sign up information is provided on this After Visit Summary.  MyChart is used to connect with patients for Virtual Visits (Telemedicine).  Patients are able to view lab/test results, encounter notes, upcoming appointments, etc.  Non-urgent messages can be sent to your provider as well.   To learn more about what you can do with MyChart, go to [email or web address].    Your next appointment:   One month office visit with Dr. Royann Ferguson or an APP.    Other Instructions    [facility] CARDIOVASCULAR  DIVISION [facility] NOR[place]LINE [address] [place] [address] [place] [address] Dept: [phone] Loc: [phone]  Mary Ferguson  11/25/2019  You are scheduled for a Cardiac Catheterization on Tuesday, April 6 with Dr. Nicki Guadalajara.  1. Please arrive at the [facility] (Main Entrance A) at [facility]: [address] [place], [place] [address] at 5:30 AM (This time is two hours before your procedure to ensure your preparation). Free valet parking service is available.   Special note: Every effort is made to have your procedure done on time. Please understand that emergencies sometimes delay scheduled procedures.  2. Diet: Do not eat solid foods after midnight.  The patient may have clear liquids until 5am upon the day of the procedure.  3. Labs: You will need to have blood drawn on April 2nd at the [facility] office. You do not need an appointment. You do not need to be fasting.  You will need to have the coronavirus test completed prior to your procedure. An appointment has been made at 12:50 on 11/26/19. This is a Drive Up Visit at the [facility] [address]. Someone will direct you to the appropriate testing line. Please tell them that you are there for procedure testing. Stay in your car and someone will be with you shortly. Please make sure to have all other labs completed before this test because you will need to stay quarantined until your procedure.   4. Medication instructions in preparation for your procedure: Hold Xarelto for 48 hours prior to the procedure. Hold on Sunday 4/4 and Monday 4/5.  On the morning of your procedure, take your Aspirin and Plavix and any morning medicines NOT listed above.  You may use sips of water.  5. Plan for one night stay--bring personal belongings. 6. Bring a current list of your medications and current insurance cards. 7. You MUST have a responsible person to drive you home. 8. Someone MUST be with you the first 24  hours after you arrive home or your discharge will be delayed. 9. Please wear clothes that  are easy to get on and off and wear slip-on shoes.  Thank you for allowing [place] to care for you!   -- [place] [facility]     Patient Instructions  Medication Instructions:  No changes *If you need a refill on your cardiac medications before your next appointment, please call your pharmacy*   Testing/Procedures: Your physician has requested that you have a cardiac catheterization. Cardiac catheterization is used to diagnose and/or treat various heart conditions. Doctors may recommend this procedure for a number of different reasons. The most common reason is to evaluate chest pain. Chest pain can be a symptom of coronary artery disease (CAD), and cardiac catheterization can show whether plaque is narrowing or blocking your heart's arteries. This procedure is also used to evaluate the valves, as well as measure the blood flow and oxygen levels in different parts of your heart. For further information please visit [email or web address] Please follow instruction sheet, as given.   Follow-Up: At [facility], you and your health needs are our priority.  As part of our continuing mission to provide you with exceptional heart care, we have created designated Provider Care Teams.  These Care Teams include your primary Cardiologist (physician) and Advanced Practice Providers (APPs -  Physician Assistants and Nurse Practitioners) who all work together to provide you with the care you need, when you need it.  We recommend signing up for the patient portal called "MyChart".  Sign up information is provided on this After Visit Summary.  MyChart is used to connect with patients for Virtual Visits (Telemedicine).  Patients are able to view lab/test results, encounter notes, upcoming appointments, etc.  Non-urgent messages can be sent to your provider as well.   To learn more about what you can  do with MyChart, go to [email or web address].    Your next appointment:   One month office visit with Dr. Royann Ferguson or an APP.    Other Instructions    [place] CARDIOVASCULAR DIVISION [facility] [place]LINE [address] [place] [address] [place] [address] Dept: [phone] Loc: [phone]  Mary Ferguson  11/25/2019  You are scheduled for a Cardiac Catheterization on Tuesday, April 6 with Dr. Nicki Guadalajara.  1. Please arrive at the [facility] (Main Entrance A) at [facility]: [address] [place], [place] [address] at 5:30 AM (This time is two hours before your procedure to ensure your preparation). Free valet parking service is available.   Special note: Every effort is made to have your procedure done on time. Please understand that emergencies sometimes delay scheduled procedures.  2. Diet: Do not eat solid foods after midnight.  The patient may have clear liquids until 5am upon the day of the procedure.  3. Labs: You will need to have blood drawn on April 2nd at the [facility] office. You do not need an appointment. You do not need to be fasting.  You will need to have the coronavirus test completed prior to your procedure. An appointment has been made at 12:50 on 11/26/19. This is a Drive Up Visit at the [facility] [address] Someone will direct you to the appropriate testing line. Please tell them that you are there for procedure testing. Stay in your car and someone will be with you shortly. Please make sure to have all other labs completed before this test because you will need to stay quarantined until your procedure.   4. Medication instructions in preparation for your procedure:  Hold Xarelto for 48 hours prior to the procedure. Hold on Sunday 4/4 and Monday 4/5.  On the morning of your procedure, take your Aspirin and Plavix and any morning medicines NOT listed above.  You may use sips of water.  5. Plan for one  night stay--bring personal belongings. 6. Bring a current list of your medications and current insurance cards. 7. You MUST have a responsible person to drive you home. 8. Someone MUST be with you the first 24 hours after you arrive home or your discharge will be delayed. 9. Please wear clothes that are easy to get on and off and wear slip-on shoes.  Thank you for allowing [place] to care for you!   -- [place] [facility]      Follow Up:  In Person 3 months  Signed, Thurmon Fair, MD  11/25/2019 11:39 AM    [facility]

## 2019-11-25 NOTE — H&P (View-Only) (Signed)
Virtual Visit via Telephone Note   This visit type was conducted due to national recommendations for restrictions regarding the COVID-19 Pandemic (e.g. social distancing) in an effort to limit this patient's exposure and mitigate transmission in our community.  Due to her co-morbid illnesses, this patient is at least at moderate risk for complications without adequate follow up.  This format is felt to be most appropriate for this patient at this time.  The patient did not have access to video technology/had technical difficulties with video requiring transitioning to audio format only (telephone).  All issues noted in this document were discussed and addressed.  No physical exam could be performed with this format.  Please refer to the patient's chart for her  consent to telehealth for [facility].   The patient was identified using 2 identifiers.  Date:  11/25/2019   ID:  Mary Ferguson, DOB 18-Jul-1951, MRN [id number]  Patient Location: Home Provider Location: Home  PCP:  Merlene Laughter, MD  Cardiologist:  Thurmon Fair, MD  Electrophysiologist:  None   Evaluation Performed:  Follow-Up Visit  Chief Complaint: Chest pain  History of Present Illness:    Mary Ferguson is a 69 y.o. female with morbid obesity, DM, HTN, mixed hyperlipidemia, history of recurrent DVT, CKD 3a and chronic pain, diagnosed with coronary artery disease when she presented with inferior wall ST segment elevation myocardial infarction in October 2020 and received drug-eluting stents to the mid and distal right coronary artery (Dr. Kirke Corin, resolute Onyx 3.5x26 and 3.0x18).  At that time she was also noted to have a 70% mid LAD stenosis that appeared to be chronic.  She had transient LV dysfunction with an EF of about 40% at the time of LV angiography, 60-65% on follow-up echocardiogram the next day.  At the time of cath LV EDP was mildly elevated at 22 mmHg.  She has chronic pain "from her head to her toes", but has been  experiencing recurrent episodes of concerning chest discomfort.  She describes this as a band constricting the middle of her chest and radiating to both arms.  Sometimes is associated with severe diaphoresis.  Most recently it happened when she walked into [facility] a couple of days ago.  It did appear to improve with rest.  She asked for refill on her nitroglycerin and took a tablet that did appear to alleviate the symptoms further.  She has had 5 or 6 of these events in the last month or so.  This has happened although she was prescribed isosorbide mononitrate at her March 5 appointment with [facility].  She is also taking a beta-blocker.  She does not appear to have exertional dyspnea and definitely denies orthopnea, PND, leg edema, claudication and any focal neurological complaints.  She has not had any bleeding complications on combination of clopidogrel and Xarelto.  She believes that she had coronavirus infection last fall, before her cardiac catheterization, although her test was negative.  She had a lot of flulike side effects with her first moderate vaccine at the beginning of this month and is scheduled to get the next vaccine on April 8.  She bears a diagnosis of CKD stage III.  Her creatinine has been in the 1.0-1.2 range consistently.  She has a history of recurrent DVT and is on chronic treatment with Xarelto.  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  She was on chronic opiate therapy for a while, but her quality of life deteriorated substantially so she stopped taking them and is very afraid of becoming addicted to them in the future.  The patient does not have symptoms concerning for COVID-19 infection (fever, chills, cough, or new shortness of breath).    Past Medical History:  Diagnosis Date  . Asthma   . Chronic generalized  abdominal pain   . Chronic pain   . CKD (chronic kidney disease), stage III   . Hypertension   . Morbid obesity ([place])   . Recurrent deep vein thrombosis (DVT) ([facility])    Past Surgical History:  Procedure Laterality Date  . CORONARY STENT INTERVENTION N/A 06/18/2019   Procedure: CORONARY STENT INTERVENTION;  Surgeon: Wellington Hampshire, MD;  Location: [facility];  Service: Ferguson;  Laterality: N/A;  . EYE SURGERY    . HERNIA REPAIR    . LEFT HEART CATH AND CORONARY ANGIOGRAPHY N/A 06/18/2019   Procedure: LEFT HEART CATH AND CORONARY ANGIOGRAPHY;  Surgeon: Wellington Hampshire, MD;  Location: [facility];  Service: Ferguson;  Laterality: N/A;  . VEIN SURGERY       Current Meds  Medication Sig  . atorvastatin (LIPITOR) 80 MG tablet Take 1 tablet (80 mg total) by mouth daily.  . carvedilol (COREG) 3.125 MG tablet Take 1 tablet (3.125 mg total) by mouth 2 (two) times daily with a meal.  . clobetasol ointment (TEMOVATE) 5.39 % Apply 1 application topically 2 (two) times daily.  . clopidogrel (PLAVIX) 75 MG tablet Take 1 tablet (75 mg total) by mouth daily with breakfast.  . dicyclomine (BENTYL) 20 MG tablet Take 20 mg by mouth 3 (three) times daily as needed for spasms.  . diphenoxylate-atropine (LOMOTIL) 2.5-0.025 MG tablet Take 1 tablet by mouth 4 (four) times daily as needed for diarrhea or loose stools.  . hydrOXYzine (ATARAX/VISTARIL) 25 MG tablet Take 25 mg by mouth 2 (two) times daily as needed for itching.   . isosorbide mononitrate (IMDUR) 30 MG 24 hr tablet Take 1 tablet (30 mg total) by mouth daily.  . metroNIDAZOLE (METROCREAM) 0.75 % cream Apply 1 application topically 2 (two) times daily.  . nitroGLYCERIN (NITROSTAT) 0.4 MG SL tablet Place 1 tablet (0.4 mg total) under the tongue every 5 (five) minutes x 3 doses as needed for chest pain.  . promethazine (PHENERGAN) 25 MG tablet Take 25 mg by mouth daily as needed for nausea.   . psyllium (METAMUCIL SMOOTH  TEXTURE) 28 % packet Take 1 packet by mouth 2 (two) times daily.  . rivaroxaban (XARELTO) 10 MG TABS tablet Take 10 mg by mouth every evening.     Allergies:   Rofecoxib, Tetracyclines & related, Benadryl allergy [diphenhydramine hcl], Cephalexin, and Gatifloxacin   Social History   Tobacco Use  . Smoking status: Former Research scientist (life sciences)  . Smokeless tobacco: Never Used  Substance Use Topics  . Alcohol use: Never  . Drug use: Never     Family Hx: The patient's family history is not on file.  ROS:   Please see the history of present illness.    All other systems reviewed and are negative.   Prior CV studies:   The following studies were reviewed today:  Cardiac catheterization 06/18/2019  Diagnostic Dominance: Right  Intervention   Implants   Permanent Stent  Stent Resolute Onyx 3.5x26 - [id number] - Implanted  Inventory item: Loma Sousa 9.0W40 Model/Cat number: [id number]  Manufacturer: [employer] [place]  AVE Lot number: [phone]  Device identifier: [id number] Device identifier type: GS1  Area Of Implantation: Mid RCA         Labs/Other Tests and Data Reviewed:    EKG:  No ECG reviewed.  Recent Labs: 06/17/2019: B Natriuretic Peptide 172.0 06/22/2019: Hemoglobin 14.3; Platelets 355 10/26/2019: ALT 13; BUN 24; Creatinine, Ser 1.07; Potassium 4.4; Sodium 140   Recent Lipid Panel Lab Results  Component Value Date/Time   CHOL 115 10/26/2019 08:11 AM   TRIG 115 10/26/2019 08:11 AM   HDL 40 10/26/2019 08:11 AM   CHOLHDL 2.9 10/26/2019 08:11 AM   CHOLHDL 5.2 06/18/2019 03:48 AM   LDLCALC 54 10/26/2019 08:11 AM    Wt Readings from Last 3 Encounters:  11/25/19 251 lb (113.9 kg)  11/02/19 243 lb 9.6 oz (110.5 kg)  06/30/19 253 lb 9.6 oz (115 kg)     Objective:    Vital Signs:  Ht 5\' 3"  (1.6 m)   Wt 251 lb (113.9 kg)   BMI 44.46 kg/m    VITAL SIGNS:  reviewed Unable to examine  ASSESSMENT & PLAN:    1. CAD: Mary Ferguson is describing  episodes of exertional angina pectoris with minimal activity, even though she is taking 2 antianginal medications.  Is possible she has restenosis in one of the 2 stents implanted in the right coronary system, although it is also possible that her angina is related to the mid LAD stenosis.  I have recommended repeat coronary angiography.  If the RCA stents are patent I would recommend PCI-stent to the mid LAD.  She will hold her Xarelto for 2 days before angiography.  She will continue taking the Plavix and should take aspirin on the day of the procedure.  Continue beta-blocker and long-acting nitrate. This procedure has been fully reviewed with the patient and informed consent has been obtained. 2. HLP: Excellent lipid profile on current statin regimen with LDL cholesterol of 54.  Even the HDL has improved since last October, although it remains fairly low at 40. 3. DM: She reports good glycemic control.  The most recent hemoglobin A1c was from last October at 6.5%.  She has borderline abnormalities in renal function (GFR in 50-60 mL/minute range). 4. Hx of Recurrent DVT: We will hold Xarelto for 2 days before the diagnostic/possible PCI cardiac catheterization.  Clopidogrel is the preferred antiplatelet agent to reduce bleeding risk. 5. Morbid obesity  COVID-19 Education: The signs and symptoms of COVID-19 were discussed with the patient and how to seek care for testing (follow up with PCP or arrange E-visit).  The importance of social distancing was discussed today.  Time:   Today, I have spent 30 minutes with the patient with telehealth technology discussing the above problems.     Medication Adjustments/Labs and Tests Ordered: Current medicines are reviewed at length with the patient today.  Concerns regarding medicines are outlined above.   Tests Ordered: Orders Placed This Encounter  Procedures  . Basic metabolic panel  . CBC    Medication Changes: No orders of the defined types were  placed in this encounter.  Patient Instructions  Medication Instructions:  No changes *If you need a refill on your cardiac medications before your next appointment, please call your pharmacy*   Testing/Procedures: Your physician has requested that you have a cardiac catheterization. Cardiac catheterization is used to diagnose and/or treat various heart conditions. Doctors may recommend this procedure for a number of different reasons. The most common reason is to evaluate  chest pain. Chest pain can be a symptom of coronary artery disease (CAD), and cardiac catheterization can show whether plaque is narrowing or blocking your heart's arteries. This procedure is also used to evaluate the valves, as well as measure the blood flow and oxygen levels in different parts of your heart. For further information please visit [email or web address]. Please follow instruction sheet, as given.   Follow-Up: At [facility], you and your health needs are our priority.  As part of our continuing mission to provide you with exceptional heart care, we have created designated Provider Care Teams.  These Care Teams include your primary Cardiologist (physician) and Advanced Practice Providers (APPs -  Physician Assistants and Nurse Practitioners) who all work together to provide you with the care you need, when you need it.  We recommend signing up for the patient portal called "MyChart".  Sign up information is provided on this After Visit Summary.  MyChart is used to connect with patients for Virtual Visits (Telemedicine).  Patients are able to view lab/test results, encounter notes, upcoming appointments, etc.  Non-urgent messages can be sent to your provider as well.   To learn more about what you can do with MyChart, go to [email or web address].    Your next appointment:   One month office visit with Dr. Loudon Krakow or an APP.    Other Instructions    Mary Ferguson  DIVISION [facility] [address] [place] [address] Dept: [phone] Loc: [phone]  Mary Ferguson  11/25/2019  You are scheduled for a Cardiac Catheterization on Tuesday, April 6 with Dr. Thomas Kelly.  1. Please arrive at the [place] (Main Entrance A) at [facility]: [address] , [place] [address] at 5:30 AM (This time is two hours before your procedure to ensure your preparation). Free valet parking service is available.   Special note: Every effort is made to have your procedure done on time. Please understand that emergencies sometimes delay scheduled procedures.  2. Diet: Do not eat solid foods after midnight.  The patient may have clear liquids until 5am upon the day of the procedure.  3. Labs: You will need to have blood drawn on April 2nd at the [place] office. You do not need an appointment. You do not need to be fasting.  You will need to have the coronavirus test completed prior to your procedure. An appointment has been made at 12:50 on 11/26/19. This is a Drive Up Visit at the [facility] [address]. Someone will direct you to the appropriate testing line. Please tell them that you are there for procedure testing. Stay in your car and someone will be with you shortly. Please make sure to have all other labs completed before this test because you will need to stay quarantined until your procedure.   4. Medication instructions in preparation for your procedure: Hold Xarelto for 48 hours prior to the procedure. Hold on Sunday 4/4 and Monday 4/5.  On the morning of your procedure, take your Aspirin and Plavix and any morning medicines NOT listed above.  You may use sips of water.  5. Plan for one night stay--bring personal belongings. 6. Bring a current list of your medications and current insurance cards. 7. You MUST have a responsible person to drive you home. 8. Someone MUST be with you the first 24  hours after you arrive home or your discharge will be delayed. 9. Please wear clothes that   are easy to get on and off and wear slip-on shoes.  Thank you for allowing [place] to care for you!   -- [place] [facility]     Patient Instructions  Medication Instructions:  No changes *If you need a refill on your cardiac medications before your next appointment, please call your pharmacy*   Testing/Procedures: Your physician has requested that you have a cardiac catheterization. Cardiac catheterization is used to diagnose and/or treat various heart conditions. Doctors may recommend this procedure for a number of different reasons. The most common reason is to evaluate chest pain. Chest pain can be a symptom of coronary artery disease (CAD), and cardiac catheterization can show whether plaque is narrowing or blocking your heart's arteries. This procedure is also used to evaluate the valves, as well as measure the blood flow and oxygen levels in different parts of your heart. For further information please visit [email or web address] Please follow instruction sheet, as given.   Follow-Up: At [facility], you and your health needs are our priority.  As part of our continuing mission to provide you with exceptional heart care, we have created designated Provider Care Teams.  These Care Teams include your primary Cardiologist (physician) and Advanced Practice Providers (APPs -  Physician Assistants and Nurse Practitioners) who all work together to provide you with the care you need, when you need it.  We recommend signing up for the patient portal called "MyChart".  Sign up information is provided on this After Visit Summary.  MyChart is used to connect with patients for Virtual Visits (Telemedicine).  Patients are able to view lab/test results, encounter notes, upcoming appointments, etc.  Non-urgent messages can be sent to your provider as well.   To learn more about what you can  do with MyChart, go to [email or web address].    Your next appointment:   One month office visit with Dr. Royann Shivers or an APP.    Other Instructions    [facility] [address] [place] [address] Dept: [phone] Loc: [phone]  Mary Ferguson  11/25/2019  You are scheduled for a Cardiac Catheterization on Tuesday, April 6 with Dr. Nicki Guadalajara.  1. Please arrive at the [facility] (Main Entrance A) at [facility]: [address] [place], [place] [address] at 5:30 AM (This time is two hours before your procedure to ensure your preparation). Free valet parking service is available.   Special note: Every effort is made to have your procedure done on time. Please understand that emergencies sometimes delay scheduled procedures.  2. Diet: Do not eat solid foods after midnight.  The patient may have clear liquids until 5am upon the day of the procedure.  3. Labs: You will need to have blood drawn on April 2nd at the [facility] office. You do not need an appointment. You do not need to be fasting.  You will need to have the coronavirus test completed prior to your procedure. An appointment has been made at 12:50 on 11/26/19. This is a Drive Up Visit at the [facility] [address] Someone will direct you to the appropriate testing line. Please tell them that you are there for procedure testing. Stay in your car and someone will be with you shortly. Please make sure to have all other labs completed before this test because you will need to stay quarantined until your procedure.   4. Medication instructions in preparation for your procedure:  Hold Xarelto for 48 hours prior to the procedure. Hold on Sunday 4/4 and Monday 4/5.  On the morning of your procedure, take your Aspirin and Plavix and any morning medicines NOT listed above.  You may use sips of water.  5. Plan for one  night stay--bring personal belongings. 6. Bring a current list of your medications and current insurance cards. 7. You MUST have a responsible person to drive you home. 8. Someone MUST be with you the first 24 hours after you arrive home or your discharge will be delayed. 9. Please wear clothes that are easy to get on and off and wear slip-on shoes.  Thank you for allowing [place] to care for you!   -- [place] [facility]      Follow Up:  In Person 3 months  Signed, Thurmon Fair, MD  11/25/2019 11:39 AM    [facility]

## 2019-11-25 NOTE — Telephone Encounter (Signed)
The patient has been called about the virtual appointment today with Dr. Royann Shivers. Instructions provided. The AVS will be mailed. The patient verbalized their understanding.  You are scheduled for a Cardiac Catheterization on Tuesday, April 6 with Dr. Nicki Guadalajara.  1. Please arrive at the [facility] (Main Entrance A) at [facility]: [address] [place], [place] [address] at 5:30 AM (This time is two hours before your procedure to ensure your preparation). Free valet parking service is available.   Special note: Every effort is made to have your procedure done on time. Please understand that emergencies sometimes delay scheduled procedures.  2. Diet: Do not eat solid foods after midnight.  The patient may have clear liquids until 5am upon the day of the procedure.  3. Labs: You will need to have blood drawn on April 2nd at the [facility] office. You do not need an appointment. You do not need to be fasting.  You will need to have the coronavirus test completed prior to your procedure. An appointment has been made at 12:50 on 11/26/19. This is aDrive Engineer, technical sales the [facility] [address]. Someone will direct you to the appropriate testing line. Please tell them that you are there for procedure testing. Stay in your car and someone will be with you shortly. Please make sure to have all other labs completed before this test because you will need to stay quarantined until your procedure.   4. Medication instructions in preparation for your procedure: Hold Xarelto for 48 hours prior to the procedure. Hold on Sunday 4/4 and Monday 4/5.  On the morning of your procedure, take your Aspirin and Plavix and any morning medicines NOT listed above.  You may use sips of water.  5. Plan for one night stay--bring personal belongings. 6. Bring a current list of your medications and current insurance cards. 7. You MUST have a responsible person to drive you home. 8.  Someone MUST be with you the first 24 hours after you arrive home or your discharge will be delayed. 9. Please wear clothes that are easy to get on and off and wear slip-on shoes.  Thank you for allowing [place] to care for you!   -- [place] [facility]

## 2019-11-25 NOTE — Telephone Encounter (Signed)
  Patient Consent for Virtual Visit         Mary Ferguson has provided verbal consent on 11/25/2019 for a virtual visit (video or telephone).   CONSENT FOR VIRTUAL VISIT FOR:  Mary Ferguson  By participating in this virtual visit I agree to the following:  I hereby voluntarily request, consent and authorize [facility] and its employed or contracted physicians, physician assistants, nurse practitioners or other licensed health care professionals (the Practitioner), to provide me with telemedicine health care services (the "Services") as deemed necessary by the treating Practitioner. I acknowledge and consent to receive the Services by the Practitioner via telemedicine. I understand that the telemedicine visit will involve communicating with the Practitioner through live audiovisual communication technology and the disclosure of certain medical information by electronic transmission. I acknowledge that I have been given the opportunity to request an in-person assessment or other available alternative prior to the telemedicine visit and am voluntarily participating in the telemedicine visit.  I understand that I have the right to withhold or withdraw my consent to the use of telemedicine in the course of my care at any time, without affecting my right to future care or treatment, and that the Practitioner or I may terminate the telemedicine visit at any time. I understand that I have the right to inspect all information obtained and/or recorded in the course of the telemedicine visit and may receive copies of available information for a reasonable fee.  I understand that some of the potential risks of receiving the Services via telemedicine include:  Marland Kitchen Delay or interruption in medical evaluation due to technological equipment failure or disruption; . Information transmitted may not be sufficient (e.g. poor resolution of images) to allow for appropriate medical decision making by the Practitioner; and/or   . In rare instances, security protocols could fail, causing a breach of personal health information.  Furthermore, I acknowledge that it is my responsibility to provide information about my medical history, conditions and care that is complete and accurate to the best of my ability. I acknowledge that Practitioner's advice, recommendations, and/or decision may be based on factors not within their control, such as incomplete or inaccurate data provided by me or distortions of diagnostic images or specimens that may result from electronic transmissions. I understand that the practice of medicine is not an exact science and that Practitioner makes no warranties or guarantees regarding treatment outcomes. I acknowledge that a copy of this consent can be made available to me via my patient portal [facility] MyChart), or I can request a printed copy by calling the office of [facility].    I understand that my insurance will be billed for this visit.   I have read or had this consent read to me. . I understand the contents of this consent, which adequately explains the benefits and risks of the Services being provided via telemedicine.  . I have been provided ample opportunity to ask questions regarding this consent and the Services and have had my questions answered to my satisfaction. . I give my informed consent for the services to be provided through the use of telemedicine in my medical care

## 2019-11-25 NOTE — Patient Instructions (Addendum)
Medication Instructions:  No changes *If you need a refill on your cardiac medications before your next appointment, please call your pharmacy*   Testing/Procedures: Your physician has requested that you have a cardiac catheterization. Cardiac catheterization is used to diagnose and/or treat various heart conditions. Doctors may recommend this procedure for a number of different reasons. The most common reason is to evaluate chest pain. Chest pain can be a symptom of coronary artery disease (CAD), and cardiac catheterization can show whether plaque is narrowing or blocking your heart's arteries. This procedure is also used to evaluate the valves, as well as measure the blood flow and oxygen levels in different parts of your heart. For further information please visit [email or web address] Please follow instruction sheet, as given.   Follow-Up: At [facility], you and your health needs are our priority.  As part of our continuing mission to provide you with exceptional heart care, we have created designated Provider Care Teams.  These Care Teams include your primary Cardiologist (physician) and Advanced Practice Providers (APPs -  Physician Assistants and Nurse Practitioners) who all work together to provide you with the care you need, when you need it.  We recommend signing up for the patient portal called "MyChart".  Sign up information is provided on this After Visit Summary.  MyChart is used to connect with patients for Virtual Visits (Telemedicine).  Patients are able to view lab/test results, encounter notes, upcoming appointments, etc.  Non-urgent messages can be sent to your provider as well.   To learn more about what you can do with MyChart, go to [email or web address].    Your next appointment:   One month office visit with Dr. Royann Shivers or an APP.    Other Instructions    [facility] CARDIOVASCULAR DIVISION [facility] [address]  [address] [place] [address] Dept: [phone] Loc: [phone]  Mary Ferguson  11/25/2019  You are scheduled for a Cardiac Catheterization on Tuesday, April 6 with Dr. Nicki Guadalajara.  1. Please arrive at the [facility] (Main Entrance A) at [facility]: [address] [place], [place] [address] at 5:30 AM (This time is two hours before your procedure to ensure your preparation). Free valet parking service is available.   Special note: Every effort is made to have your procedure done on time. Please understand that emergencies sometimes delay scheduled procedures.  2. Diet: Do not eat solid foods after midnight.  The patient may have clear liquids until 5am upon the day of the procedure.  3. Labs: You will need to have blood drawn on April 2nd at the [facility] office. You do not need an appointment. You do not need to be fasting.  You will need to have the coronavirus test completed prior to your procedure. An appointment has been made at 12:50 on 11/26/19. This is a Drive Up Visit at the [facility] [address]. Someone will direct you to the appropriate testing line. Please tell them that you are there for procedure testing. Stay in your car and someone will be with you shortly. Please make sure to have all other labs completed before this test because you will need to stay quarantined until your procedure.   4. Medication instructions in preparation for your procedure: Hold Xarelto for 48 hours prior to the procedure. Hold on Sunday 4/4 and Monday 4/5.  On the morning of your procedure, take your Aspirin and Plavix and any morning medicines NOT listed above.  You may use sips of water.  5. Plan for one night stay--bring personal belongings. 6. Bring a current list of your medications and current insurance cards. 7. You MUST have a responsible person to drive you home. 8. Someone MUST be with you the first 24 hours after you arrive home or your discharge will be  delayed. 9. Please wear clothes that are easy to get on and off and wear slip-on shoes.  Thank you for allowing [place] to care for you!   -- [facility]

## 2019-11-26 ENCOUNTER — Other Ambulatory Visit (HOSPITAL_COMMUNITY)
Admission: RE | Admit: 2019-11-26 | Discharge: 2019-11-26 | Disposition: A | Discharge status: Home / Self Care | Payer: Medicare Other | Source: Ambulatory Visit | Attending: Cardiovascular Disease | Admitting: Cardiovascular Disease

## 2019-11-26 DIAGNOSIS — Z20822 Contact with and (suspected) exposure to covid-19: Secondary | ICD-10-CM | POA: Diagnosis not present

## 2019-11-26 DIAGNOSIS — Z01812 Encounter for preprocedural laboratory examination: Secondary | ICD-10-CM | POA: Diagnosis present

## 2019-11-26 LAB — SARS CORONAVIRUS 2 (TAT 6-24 HRS): SARS Coronavirus 2: NEGATIVE

## 2019-11-27 LAB — BASIC METABOLIC PANEL
BUN/Creatinine Ratio: 22 (ref 12–28)
BUN: 22 mg/dL (ref 8–27)
CO2: 17 mmol/L — ABNORMAL LOW (ref 20–29)
Calcium: 10.4 mg/dL — ABNORMAL HIGH (ref 8.7–10.3)
Chloride: 103 mmol/L (ref 96–106)
Creatinine, Ser: 0.99 mg/dL (ref 0.57–1.00)
GFR calc Af Amer: 68 mL/min/{1.73_m2} (ref >59)
GFR calc non Af Amer: 59 mL/min/{1.73_m2} — ABNORMAL LOW (ref >59)
Glucose: 74 mg/dL (ref 65–99)
Potassium: 5.2 mmol/L (ref 3.5–5.2)
Sodium: 138 mmol/L (ref 134–144)

## 2019-11-27 LAB — CBC
Hematocrit: 45.4 % (ref 34.0–46.6)
Hemoglobin: 14.9 g/dL (ref 11.1–15.9)
MCH: 26.8 pg (ref 26.6–33.0)
MCHC: 32.8 g/dL (ref 31.5–35.7)
MCV: 82 fL (ref 79–97)
Platelets: 283 10*3/uL (ref 150–450)
RBC: 5.55 x10E6/uL — ABNORMAL HIGH (ref 3.77–5.28)
RDW: 15.3 % (ref 11.7–15.4)
WBC: 8.6 10*3/uL (ref 3.4–10.8)

## 2019-11-29 ENCOUNTER — Telehealth: Payer: Self-pay | Admitting: *Deleted

## 2019-11-29 NOTE — Telephone Encounter (Signed)
Pt contacted pre-catheterization scheduled at [facility] for: Tuesday November 30, 2019 7:30 AM Verified arrival time and place: [facility] Main Entrance A [facility]) at: 5:30 AM   No solid food after midnight prior to cath, clear liquids until 5 AM day of procedure. Contrast allergy: no  Hold: Xarelto-none 11/28/19 until post procedure  Except hold medications AM meds can be  taken pre-cath with sip of water including: ASA 81 mg Plavix 75 mg  Confirmed patient has responsible adult to drive home post procedure and observe 24 hours after arriving home: yes  Currently, due to Covid-19 pandemic, only one person will be allowed with patient. Must be the same person for patient's entire stay and will be required to wear a mask. They will be asked to wait in the waiting room for the duration of the patient's stay.  Patients are required to wear a mask when they enter the hospital.     COVID-19 Pre-Screening Questions:  . In the past 7 to 10 days have you had a cough,  shortness of breath, headache, congestion, fever (100 or greater) body aches, chills, sore throat, or sudden loss of taste or sense of smell? Cough, not new-asthma/allergy--loss of sense of taste since September 2020 . Have you been around anyone with known Covid 19 in the past 7-10 days? no . Have you been around anyone who is awaiting Covid 19 test results in the past 7 to 10 days? no . Have you been around anyone who has mentioned symptoms of Covid 19 in the past 7 to 10 days? no      Reviewed procedure/mask/visitor instructions, COVID-19 screening questions with patient.

## 2019-11-30 ENCOUNTER — Ambulatory Visit (HOSPITAL_COMMUNITY)
Admission: RE | Admit: 2019-11-30 | Discharge: 2019-11-30 | Disposition: A | Discharge status: Home / Self Care | Payer: Medicare Other | Attending: Cardiovascular Disease | Admitting: Cardiovascular Disease

## 2019-11-30 ENCOUNTER — Other Ambulatory Visit: Payer: Self-pay

## 2019-11-30 ENCOUNTER — Encounter (HOSPITAL_COMMUNITY)
Admission: RE | Disposition: A | Discharge status: Home / Self Care | Payer: Self-pay | Source: Home / Self Care | Attending: Cardiovascular Disease

## 2019-11-30 DIAGNOSIS — Z7902 Long term (current) use of antithrombotics/antiplatelets: Secondary | ICD-10-CM | POA: Insufficient documentation

## 2019-11-30 DIAGNOSIS — Z86718 Personal history of other venous thrombosis and embolism: Secondary | ICD-10-CM | POA: Diagnosis not present

## 2019-11-30 DIAGNOSIS — E782 Mixed hyperlipidemia: Secondary | ICD-10-CM | POA: Insufficient documentation

## 2019-11-30 DIAGNOSIS — Z79899 Other long term (current) drug therapy: Secondary | ICD-10-CM | POA: Insufficient documentation

## 2019-11-30 DIAGNOSIS — Z955 Presence of coronary angioplasty implant and graft: Secondary | ICD-10-CM | POA: Insufficient documentation

## 2019-11-30 DIAGNOSIS — Z881 Allergy status to other antibiotic agents status: Secondary | ICD-10-CM | POA: Insufficient documentation

## 2019-11-30 DIAGNOSIS — Z87891 Personal history of nicotine dependence: Secondary | ICD-10-CM | POA: Diagnosis not present

## 2019-11-30 DIAGNOSIS — I129 Hypertensive chronic kidney disease with stage 1 through stage 4 chronic kidney disease, or unspecified chronic kidney disease: Secondary | ICD-10-CM | POA: Diagnosis not present

## 2019-11-30 DIAGNOSIS — Z7901 Long term (current) use of anticoagulants: Secondary | ICD-10-CM | POA: Insufficient documentation

## 2019-11-30 DIAGNOSIS — Z6841 Body Mass Index (BMI) 40.0 and over, adult: Secondary | ICD-10-CM | POA: Diagnosis not present

## 2019-11-30 DIAGNOSIS — Z888 Allergy status to other drugs, medicaments and biological substances status: Secondary | ICD-10-CM | POA: Insufficient documentation

## 2019-11-30 DIAGNOSIS — N1831 Chronic kidney disease, stage 3a: Secondary | ICD-10-CM | POA: Insufficient documentation

## 2019-11-30 DIAGNOSIS — I25119 Atherosclerotic heart disease of native coronary artery with unspecified angina pectoris: Secondary | ICD-10-CM

## 2019-11-30 HISTORY — PX: LEFT HEART CATH AND CORONARY ANGIOGRAPHY: CATH118249

## 2019-11-30 SURGERY — LEFT HEART CATH AND CORONARY ANGIOGRAPHY
Anesthesia: LOCAL

## 2019-11-30 MED ORDER — FENTANYL CITRATE (PF) 100 MCG/2ML IJ SOLN
INTRAMUSCULAR | Status: DC | PRN
  Administered 2019-11-30: 25 ug via INTRAVENOUS

## 2019-11-30 MED ORDER — HYDRALAZINE HCL 20 MG/ML IJ SOLN: 10.0000 mg | INTRAMUSCULAR | Status: DC | PRN

## 2019-11-30 MED ORDER — HEPARIN SODIUM (PORCINE) 1000 UNIT/ML IJ SOLN
INTRAMUSCULAR | Status: DC | PRN
  Administered 2019-11-30: 5500 [IU] via INTRAVENOUS

## 2019-11-30 MED ORDER — FENTANYL CITRATE (PF) 100 MCG/2ML IJ SOLN
INTRAMUSCULAR | Status: AC
  Filled 2019-11-30: qty 2

## 2019-11-30 MED ORDER — HEPARIN SODIUM (PORCINE) 1000 UNIT/ML IJ SOLN
INTRAMUSCULAR | Status: AC
  Filled 2019-11-30: qty 1

## 2019-11-30 MED ORDER — IOHEXOL 350 MG/ML SOLN
INTRAVENOUS | Status: DC | PRN
  Administered 2019-11-30: 95 mL

## 2019-11-30 MED ORDER — CLOPIDOGREL BISULFATE 75 MG PO TABS: 75.0000 mg | ORAL_TABLET | ORAL | Status: DC

## 2019-11-30 MED ORDER — SODIUM CHLORIDE 0.9% FLUSH: 3.0000 mL | Freq: Two times a day (BID) | INTRAVENOUS | Status: DC

## 2019-11-30 MED ORDER — LABETALOL HCL 5 MG/ML IV SOLN: 10.0000 mg | INTRAVENOUS | Status: DC | PRN

## 2019-11-30 MED ORDER — SODIUM CHLORIDE 0.9 % IV SOLN: INTRAVENOUS | Status: DC

## 2019-11-30 MED ORDER — VERAPAMIL HCL 2.5 MG/ML IV SOLN
INTRAVENOUS | Status: DC | PRN
  Administered 2019-11-30: 10 mL via INTRA_ARTERIAL

## 2019-11-30 MED ORDER — LIDOCAINE HCL (PF) 1 % IJ SOLN
INTRAMUSCULAR | Status: DC | PRN
  Administered 2019-11-30: 3 mL

## 2019-11-30 MED ORDER — ATORVASTATIN CALCIUM 80 MG PO TABS
80.0000 mg | ORAL_TABLET | Freq: Every day | ORAL | Status: DC
  Filled 2019-11-30 (×2): qty 1

## 2019-11-30 MED ORDER — HEPARIN (PORCINE) IN NACL 1000-0.9 UT/500ML-% IV SOLN
INTRAVENOUS | Status: AC
  Filled 2019-11-30: qty 500

## 2019-11-30 MED ORDER — MIDAZOLAM HCL 2 MG/2ML IJ SOLN
INTRAMUSCULAR | Status: AC
  Filled 2019-11-30: qty 2

## 2019-11-30 MED ORDER — SODIUM CHLORIDE 0.9 % WEIGHT BASED INFUSION
3.0000 mL/kg/h | INTRAVENOUS | Status: DC
  Administered 2019-11-30: 3 mL/kg/h via INTRAVENOUS

## 2019-11-30 MED ORDER — SODIUM CHLORIDE 0.9% FLUSH: 3.0000 mL | INTRAVENOUS | Status: DC | PRN

## 2019-11-30 MED ORDER — MIDAZOLAM HCL 2 MG/2ML IJ SOLN
INTRAMUSCULAR | Status: DC | PRN
  Administered 2019-11-30: 2 mg via INTRAVENOUS

## 2019-11-30 MED ORDER — CLOPIDOGREL BISULFATE 75 MG PO TABS: 75.0000 mg | ORAL_TABLET | Freq: Every day | ORAL | Status: DC

## 2019-11-30 MED ORDER — SODIUM CHLORIDE 0.9 % IV SOLN: 250.0000 mL | INTRAVENOUS | Status: DC | PRN

## 2019-11-30 MED ORDER — LIDOCAINE HCL (PF) 1 % IJ SOLN
INTRAMUSCULAR | Status: AC
  Filled 2019-11-30: qty 30

## 2019-11-30 MED ORDER — SODIUM CHLORIDE 0.9 % WEIGHT BASED INFUSION: 1.0000 mL/kg/h | INTRAVENOUS | Status: DC

## 2019-11-30 MED ORDER — ASPIRIN 81 MG PO CHEW: 81.0000 mg | CHEWABLE_TABLET | ORAL | Status: DC

## 2019-11-30 MED ORDER — ACETAMINOPHEN 325 MG PO TABS
650.0000 mg | ORAL_TABLET | ORAL | Status: DC | PRN
  Administered 2019-11-30: 650 mg via ORAL
  Filled 2019-11-30: qty 2

## 2019-11-30 MED ORDER — HEPARIN (PORCINE) IN NACL 1000-0.9 UT/500ML-% IV SOLN
INTRAVENOUS | Status: DC | PRN
  Administered 2019-11-30 (×2): 500 mL

## 2019-11-30 MED ORDER — ONDANSETRON HCL 4 MG/2ML IJ SOLN
4.0000 mg | Freq: Four times a day (QID) | INTRAMUSCULAR | Status: DC | PRN
  Administered 2019-11-30: 4 mg via INTRAVENOUS

## 2019-11-30 MED ORDER — ONDANSETRON HCL 4 MG/2ML IJ SOLN
INTRAMUSCULAR | Status: AC
  Filled 2019-11-30: qty 2

## 2019-11-30 MED ORDER — HYDROXYZINE HCL 25 MG PO TABS
25.0000 mg | ORAL_TABLET | ORAL | Status: AC
  Administered 2019-11-30: 25 mg via ORAL
  Filled 2019-11-30: qty 1

## 2019-11-30 MED ORDER — VERAPAMIL HCL 2.5 MG/ML IV SOLN
INTRAVENOUS | Status: AC
  Filled 2019-11-30: qty 2

## 2019-11-30 MED ORDER — DIAZEPAM 5 MG PO TABS: 5.0000 mg | ORAL_TABLET | Freq: Four times a day (QID) | ORAL | Status: DC | PRN

## 2019-11-30 SURGICAL SUPPLY — 14 items
CATH INFINITI 5 FR JL3.5 (CATHETERS) ×1 IMPLANT
CATH INFINITI 5FR JL4 (CATHETERS) ×1 IMPLANT
CATH LAUNCHER 5F EBU4.0 (CATHETERS) ×1 IMPLANT
CATH OPTITORQUE TIG 4.0 5F (CATHETERS) ×1 IMPLANT
DEVICE RAD COMP TR BAND LRG (VASCULAR PRODUCTS) ×1 IMPLANT
GLIDESHEATH SLEND SS 6F .021 (SHEATH) ×1 IMPLANT
GUIDEWIRE INQWIRE 1.5J.035X260 (WIRE) IMPLANT
INQWIRE 1.5J .035X260CM (WIRE) ×4
KIT HEART LEFT (KITS) ×2 IMPLANT
PACK CARDIAC CATHETERIZATION (CUSTOM PROCEDURE TRAY) ×2 IMPLANT
SHEATH PROBE COVER 6X72 (BAG) ×1 IMPLANT
TRANSDUCER W/STOPCOCK (MISCELLANEOUS) ×2 IMPLANT
TUBING CIL FLEX 10 FLL-RA (TUBING) ×2 IMPLANT
WIRE HI TORQ VERSACORE-J 145CM (WIRE) ×1 IMPLANT

## 2019-11-30 NOTE — Progress Notes (Signed)
Hematoma noted pressure held for 10 minutes.  Mary Ferguson in to assess site

## 2019-11-30 NOTE — Progress Notes (Addendum)
Blood pressure cuff applied to right below TRB area and pressure released per protocol due to hematoma noted

## 2019-11-30 NOTE — Discharge Instructions (Signed)
Radial Site Care  This sheet gives you information about how to care for yourself after your procedure. Your health care provider may also give you more specific instructions. If you have problems or questions, contact your health care provider. What can I expect after the procedure? After the procedure, it is common to have:  Bruising and tenderness at the catheter insertion area. Follow these instructions at home: Medicines  Take over-the-counter and prescription medicines only as told by your health care provider. Insertion site care  Follow instructions from your health care provider about how to take care of your insertion site. Make sure you: ? Wash your hands with soap and water before you change your bandage (dressing). If soap and water are not available, use hand sanitizer. ? Change your dressing as told by your health care provider. ? Leave stitches (sutures), skin glue, or adhesive strips in place. These skin closures may need to stay in place for 2 weeks or longer. If adhesive strip edges start to loosen and curl up, you may trim the loose edges. Do not remove adhesive strips completely unless your health care provider tells you to do that.  Check your insertion site every day for signs of infection. Check for: ? Redness, swelling, or pain. ? Fluid or blood. ? Pus or a bad smell. ? Warmth.  Do not take baths, swim, or use a hot tub until your health care provider approves.  You may shower 24-48 hours after the procedure, or as directed by your health care provider. ? Remove the dressing and gently wash the site with plain soap and water. ? Pat the area dry with a clean towel. ? Do not rub the site. That could cause bleeding.  Do not apply powder or lotion to the site. Activity   For 24 hours after the procedure, or as directed by your health care provider: ? Do not flex or bend the affected arm. ? Do not push or pull heavy objects with the affected arm. ? Do not  drive yourself home from the hospital or clinic. You may drive 24 hours after the procedure unless your health care provider tells you not to. ? Do not operate machinery or power tools.  Do not lift anything that is heavier than 10 lb (4.5 kg), or the limit that you are told, until your health care provider says that it is safe.  Ask your health care provider when it is okay to: ? Return to work or school. ? Resume usual physical activities or sports. ? Resume sexual activity. General instructions  If the catheter site starts to bleed, raise your arm and put firm pressure on the site. If the bleeding does not stop, get help right away. This is a medical emergency.  If you went home on the same day as your procedure, a responsible adult should be with you for the first 24 hours after you arrive home.  Keep all follow-up visits as told by your health care provider. This is important. Contact a health care provider if:  You have a fever.  You have redness, swelling, or yellow drainage around your insertion site. Get help right away if:  You have unusual pain at the radial site.  The catheter insertion area swells very fast.  The insertion area is bleeding, and the bleeding does not stop when you hold steady pressure on the area.  Your arm or hand becomes pale, cool, tingly, or numb. These symptoms may represent a serious problem   that is an emergency. Do not wait to see if the symptoms will go away. Get medical help right away. Call your local emergency services (911 in the [place] Do not drive yourself to the hospital. Summary  After the procedure, it is common to have bruising and tenderness at the site.  Follow instructions from your health care provider about how to take care of your radial site wound. Check the wound every day for signs of infection.  Do not lift anything that is heavier than 10 lb (4.5 kg), or the limit that you are told, until your health care provider says  that it is safe. This information is not intended to replace advice given to you by your health care provider. Make sure you discuss any questions you have with your health care provider. Document Revised: 09/17/2017 Document Reviewed: 09/17/2017 Elsevier Patient Education  2020 [employer].  

## 2019-11-30 NOTE — Progress Notes (Addendum)
Lillia Abed, NP called about when pt should resume Plavix and Xarelto states ok to take tomorrow evening. Pt instructed also given discharge instructions to pt and her son (via telephone) Both voice understanding.

## 2019-11-30 NOTE — Progress Notes (Addendum)
Pt c/o nausea, pain in right wrist Zofran and Tylenol given. Also C/o itching in her legs Lillia Abed, NP paged.new orders noted. Pt c/o seeing light in her left eye Lillia Abed also informed of this

## 2019-11-30 NOTE — Progress Notes (Signed)
Laverda Page, NP called to assess TRB site states she will come and look at it

## 2019-11-30 NOTE — Interval H&P Note (Signed)
Cath Lab Visit (complete for each Cath Lab visit)  Clinical Evaluation Leading to the Procedure:   ACS: No.  Non-ACS:    Anginal Classification: CCS III  Anti-ischemic medical therapy: Maximal Therapy (2 or more classes of medications)  Non-Invasive Test Results: No non-invasive testing performed  Prior CABG: No previous CABG      History and Physical Interval Note:  11/30/2019 7:40 AM  Mary Ferguson  has presented today for surgery, with the diagnosis of unstable angina.  The various methods of treatment have been discussed with the patient and family. After consideration of risks, benefits and other options for treatment, the patient has consented to  Procedure(s): LEFT HEART CATH AND CORONARY ANGIOGRAPHY (N/A) as a surgical intervention.  The patient's history has been reviewed, patient examined, no change in status, stable for surgery.  I have reviewed the patient's chart and labs.  Questions were answered to the patient's satisfaction.     Nicki Guadalajara

## 2019-12-15 ENCOUNTER — Telehealth: Payer: Self-pay | Admitting: Cardiology

## 2019-12-15 ENCOUNTER — Other Ambulatory Visit: Payer: Self-pay | Admitting: Cardiology

## 2019-12-15 ENCOUNTER — Encounter: Payer: Self-pay | Admitting: Cardiovascular Disease

## 2019-12-15 ENCOUNTER — Other Ambulatory Visit: Payer: Self-pay

## 2019-12-15 MED ORDER — ATORVASTATIN CALCIUM 80 MG PO TABS: 80.0000 mg | ORAL_TABLET | Freq: Every evening | ORAL | 3 refills | Status: DC

## 2019-12-15 MED ORDER — CLOPIDOGREL BISULFATE 75 MG PO TABS: 75.0000 mg | ORAL_TABLET | Freq: Every day | ORAL | 3 refills | Status: DC

## 2019-12-15 MED ORDER — CARVEDILOL 6.25 MG PO TABS: 6.2500 mg | ORAL_TABLET | Freq: Two times a day (BID) | ORAL | 3 refills | Status: DC

## 2019-12-15 MED ORDER — ISOSORBIDE MONONITRATE ER 30 MG PO TB24: 30.0000 mg | ORAL_TABLET | Freq: Every day | ORAL | 3 refills | Status: DC

## 2019-12-15 MED ORDER — RIVAROXABAN 20 MG PO TABS: 20.0000 mg | ORAL_TABLET | Freq: Every evening | ORAL | 3 refills | Status: DC

## 2019-12-15 MED ORDER — NITROGLYCERIN 0.4 MG SL SUBL: 0.4000 mg | SUBLINGUAL_TABLET | SUBLINGUAL | 3 refills | Status: DC | PRN

## 2019-12-15 NOTE — Telephone Encounter (Signed)
This is Dr. Croitoru's pt 

## 2019-12-15 NOTE — Telephone Encounter (Signed)
Pt updated and verbalized understanding. New orders placed and f/u appointment scheduled for 5/6 with Corine Shelter, PA.

## 2019-12-15 NOTE — Telephone Encounter (Signed)
*  STAT* If patient is at the pharmacy, call can be transferred to refill team.   1. Which medications need to be refilled? (please list name of each medication and dose if known)  atorvastatin (LIPITOR) 80 MG tablet  carvedilol (COREG) 3.125 MG tablet  clopidogrel (PLAVIX) 75 MG tablet nitroGLYCERIN (NITROSTAT) 0.4 MG SL tablet  isosorbide mononitrate (IMDUR) 30 MG 24 hr tablet   2. Which pharmacy/location (including street and city if local pharmacy) is medication to be sent to? [id number] - [place], [place] - [address]  3. Do they need a 30 day or 90 day supply? 90 day supply

## 2019-12-15 NOTE — Telephone Encounter (Signed)
Mary Ferguson is calling wanting to know if there are any medication changes or follow up appointments needed from her recent cath procedure. She states if she is unable to answer when calling back please leave a detailed message and she will return the call.

## 2019-12-15 NOTE — Telephone Encounter (Signed)
I am sorry that the recommendations were not clearly communicated to the patient at discharge.  She should be taking: Atorvastatin 80 mg daily. Xarelto 20 mg daily (with a meal) Clopidogrel 75 mg daily Carvedilol 6.25 mg twice daily (increased dose) Isosorbide mononitrate 30 mg daily (we may increase that further at the follow up appointment).   She should not be on ASA anymore (it was just for the cath) She needs first available appt with me or APP (she has seen Corine Shelter before).

## 2019-12-15 NOTE — Telephone Encounter (Signed)
Pt calling to reporting she never received instructions in regards to medication changes post cath. Per chart review, pt's current medication list and discharge summary does not reflect MD's post cath recommendations.    RECOMMENDATION: Continue Plavix and resume Xarelto tomorrow.  Will DC aspirin.  Plan increase medical therapy trial with titration of carvedilol to 6.25 mg twice a day and isosorbide to 60 mg daily.  Aggressive lipid-lowering therapy with target LDL less than 70.  Optimal blood pressure control with target blood pressure less than 130/80 and ideal blood pressure less than 120/80.  Patient will follow up w  ith Dr. Royann Shivers.  Current med list Carvedilol 3.125 mg BID Imdur 30 mg daily  Pt also voiced she was never informed as to when she should make f/u appointment.   Will forward to MD for recommendations.

## 2019-12-16 ENCOUNTER — Other Ambulatory Visit: Payer: Self-pay | Admitting: Geriatric Medicine

## 2019-12-16 DIAGNOSIS — Z1231 Encounter for screening mammogram for malignant neoplasm of breast: Secondary | ICD-10-CM

## 2019-12-16 LAB — CBC AND DIFFERENTIAL
HCT: 45 (ref 36–46)
Hemoglobin: 14.4 (ref 12.0–16.0)
Neutrophils Absolute: 6.8
Platelets: 323 (ref 150–399)
WBC: 9.5

## 2019-12-16 LAB — HEPATIC FUNCTION PANEL
ALT: 19 (ref 7–35)
AST: 18 (ref 13–35)
Alkaline Phosphatase: 102 (ref 25–125)
Bilirubin, Total: 7.1

## 2019-12-16 LAB — HEMOGLOBIN A1C: Hemoglobin A1C: 6.6

## 2019-12-16 LAB — HM DIABETES FOOT EXAM

## 2019-12-16 LAB — COMPREHENSIVE METABOLIC PANEL
Albumin: 4.1 (ref 3.5–5.0)
Calcium: 10.3 (ref 8.7–10.7)
GFR calc non Af Amer: 51

## 2019-12-16 LAB — BASIC METABOLIC PANEL
BUN: 24 — AB (ref 4–21)
CO2: 24 — AB (ref 13–22)
Chloride: 105 (ref 99–108)
Creatinine: 1.1 (ref 0.5–1.1)
Glucose: 126
Potassium: 4.5 (ref 3.4–5.3)
Sodium: 140 (ref 137–147)

## 2019-12-16 LAB — CBC: RBC: 5.43 — AB (ref 3.87–5.11)

## 2019-12-16 LAB — LIPID PANEL
Cholesterol: 133 (ref 0–200)
HDL: 42 (ref 35–70)
LDL Cholesterol: 63
LDl/HDL Ratio: 3.2
Triglycerides: 166 — AB (ref 40–160)

## 2019-12-16 LAB — TSH: TSH: 3.56 (ref 0.41–5.90)

## 2019-12-21 ENCOUNTER — Telehealth: Payer: Medicare Other | Admitting: Cardiology

## 2019-12-22 ENCOUNTER — Ambulatory Visit
Admission: RE | Admit: 2019-12-22 | Discharge: 2019-12-22 | Disposition: A | Discharge status: Home / Self Care | Payer: Medicare Other | Source: Ambulatory Visit | Attending: Gastroenterology | Admitting: Gastroenterology

## 2019-12-22 DIAGNOSIS — Z1211 Encounter for screening for malignant neoplasm of colon: Secondary | ICD-10-CM

## 2019-12-22 DIAGNOSIS — K625 Hemorrhage of anus and rectum: Secondary | ICD-10-CM

## 2019-12-22 DIAGNOSIS — R1084 Generalized abdominal pain: Secondary | ICD-10-CM

## 2019-12-22 LAB — HM CT VIRTUAL COLONOSCOPY

## 2019-12-22 LAB — HM COLONOSCOPY

## 2019-12-30 ENCOUNTER — Ambulatory Visit (INDEPENDENT_AMBULATORY_CARE_PROVIDER_SITE_OTHER): Payer: Medicare Other | Admitting: Cardiology

## 2019-12-30 ENCOUNTER — Encounter: Payer: Self-pay | Admitting: Cardiology

## 2019-12-30 ENCOUNTER — Other Ambulatory Visit: Payer: Self-pay

## 2019-12-30 VITALS — BP 134/87 | HR 83 | Ht 63.0 in | Wt 255.0 lb

## 2019-12-30 DIAGNOSIS — G4733 Obstructive sleep apnea (adult) (pediatric): Secondary | ICD-10-CM

## 2019-12-30 DIAGNOSIS — Z86718 Personal history of other venous thrombosis and embolism: Secondary | ICD-10-CM | POA: Diagnosis not present

## 2019-12-30 DIAGNOSIS — I10 Essential (primary) hypertension: Secondary | ICD-10-CM | POA: Diagnosis not present

## 2019-12-30 DIAGNOSIS — G8929 Other chronic pain: Secondary | ICD-10-CM

## 2019-12-30 DIAGNOSIS — I2510 Atherosclerotic heart disease of native coronary artery without angina pectoris: Secondary | ICD-10-CM | POA: Diagnosis not present

## 2019-12-30 DIAGNOSIS — I252 Old myocardial infarction: Secondary | ICD-10-CM | POA: Diagnosis not present

## 2019-12-30 DIAGNOSIS — Z9861 Coronary angioplasty status: Secondary | ICD-10-CM | POA: Diagnosis not present

## 2019-12-30 DIAGNOSIS — G4730 Sleep apnea, unspecified: Secondary | ICD-10-CM | POA: Insufficient documentation

## 2019-12-30 DIAGNOSIS — R079 Chest pain, unspecified: Secondary | ICD-10-CM

## 2019-12-30 DIAGNOSIS — E785 Hyperlipidemia, unspecified: Secondary | ICD-10-CM

## 2019-12-30 MED ORDER — ISOSORBIDE MONONITRATE ER 60 MG PO TB24: 60.0000 mg | ORAL_TABLET | Freq: Every day | ORAL | 0 refills | Status: DC

## 2019-12-30 NOTE — Progress Notes (Signed)
Cardiology Office Note:    Date:  12/30/2019   ID:  Amado Nash, DOB Jun 27, 1951, MRN [id number]  PCP:  Lajean Manes, MD  Cardiologist:  Sanda Klein, MD  Electrophysiologist:  None   Referring MD: Lajean Manes, MD   CC: chest pain  History of Present Illness:    Mary Ferguson is a 69 y.o. female with a hx of chronic abdominal and back pain. She has had multiple abdominal surgeries.  Dr Therisa Doyne follows her.  She had previously been followed at a pain clinic but is now off opiates. She presented 06/17/2019 with multiple complaints including chest pain.  She tells me she thinks she had COVID then despite a negative rapid test as she has had persistent loss of taste and smell and fatigue.  Her work up revealed a HS Troponin of 12k. . Cath was done 06/18/2019 which showed RCA disease with a high grade mRCA stenosis and high grade dRCA stenosis. She was treated with PCI/ DES x 2. She had residual disease in the mLAD of 70%.  The plan is for medical Rx. Her LVF was 40% at cath but her echo showed her EF to be 60-65% with severe LVH. 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   She was seen in F/U in March 2021.  She c/o of chest pain concerning for angina and Imdur was added.  At f/u April 1st 2021 she continued to c/o of chest pain and diagnostic cath was arranged.  This revealed patent RCA stents and unchanged 70% mLAD.  She is in the office today for follow up.  She still occasionally has SSCP "like a band"  Associated with bilater arm numbness.  Her symptoms are not exertional.  She does take NTG on occasion with relief.   Past Medical History:  Diagnosis Date  . Asthma   . Chronic generalized abdominal pain   . Chronic pain   . CKD (chronic kidney disease), stage III   . Hypertension   . Morbid obesity ([place])   . Recurrent deep vein thrombosis (DVT) (HCC)     Past Surgical History:  Procedure Laterality  Date  . CORONARY STENT INTERVENTION N/A 06/18/2019   Procedure: CORONARY STENT INTERVENTION;  Surgeon: Wellington Hampshire, MD;  Location: [facility];  Service: Cardiovascular;  Laterality: N/A;  . EYE SURGERY    . HERNIA REPAIR    . LEFT HEART CATH AND CORONARY ANGIOGRAPHY N/A 06/18/2019   Procedure: LEFT HEART CATH AND CORONARY ANGIOGRAPHY;  Surgeon: Wellington Hampshire, MD;  Location: [facility];  Service: Cardiovascular;  Laterality: N/A;  . LEFT HEART CATH AND CORONARY ANGIOGRAPHY N/A 11/30/2019   Procedure: LEFT HEART CATH AND CORONARY ANGIOGRAPHY;  Surgeon: Troy Sine, MD;  Location: [facility];  Service: Cardiovascular;  Laterality: N/A;  . VEIN SURGERY      Current Medications: Current Meds  Medication Sig  . acetaminophen (TYLENOL) 500 MG tablet Take 500 mg by mouth every 6 (six) hours as needed for mild pain or moderate pain.  Marland Kitchen atorvastatin (LIPITOR) 80 MG tablet Take 1 tablet (80 mg total) by mouth every evening.  . carvedilol (COREG) 6.25 MG tablet Take 1 tablet (6.25 mg total) by mouth 2 (two) times daily.  . clobetasol ointment (TEMOVATE) 2.50 % Apply 1 application topically daily as needed (Knots on face).   . clopidogrel (PLAVIX) 75 MG tablet Take 1 tablet (75 mg  total) by mouth daily with breakfast.  . dicyclomine (BENTYL) 20 MG tablet Take 20 mg by mouth 4 (four) times daily as needed for spasms.   . diphenoxylate-atropine (LOMOTIL) 2.5-0.025 MG tablet Take 1 tablet by mouth 4 (four) times daily as needed for diarrhea or loose stools.  . hydrOXYzine (ATARAX/VISTARIL) 25 MG tablet Take 25 mg by mouth every 6 (six) hours as needed for itching.   . isosorbide mononitrate (IMDUR) 60 MG 24 hr tablet Take 1 tablet (60 mg total) by mouth daily.  . metroNIDAZOLE (METROCREAM) 0.75 % cream Apply 1 application topically daily as needed (Itching).   . nitroGLYCERIN (NITROSTAT) 0.4 MG SL tablet Place 1 tablet (0.4 mg total) under the tongue every 5 (five)  minutes x 3 doses as needed for chest pain.  . promethazine (PHENERGAN) 25 MG tablet Take 25 mg by mouth daily as needed for nausea.   . psyllium (METAMUCIL SMOOTH TEXTURE) 28 % packet Take 1 packet by mouth 3 (three) times daily as needed (constipation). unflavored sugar-free  . rivaroxaban (XARELTO) 20 MG TABS tablet Take 1 tablet (20 mg total) by mouth every evening.  . [DISCONTINUED] isosorbide mononitrate (IMDUR) 30 MG 24 hr tablet Take 1 tablet (30 mg total) by mouth daily.     Allergies:   Rofecoxib, Tetracyclines & related, Benadryl allergy [diphenhydramine hcl], Cephalexin, and Gatifloxacin   Social History   Socioeconomic History  . Marital status: Widowed    Spouse name: Not on file  . Number of children: Not on file  . Years of education: Not on file  . Highest education level: Not on file  Occupational History  . Not on file  Tobacco Use  . Smoking status: Former Games developer  . Smokeless tobacco: Never Used  Substance and Sexual Activity  . Alcohol use: Never  . Drug use: Never  . Sexual activity: Not on file  Other Topics Concern  . Not on file  Social History Narrative  . Not on file   Social Determinants of Health   Financial Resource Strain:   . Difficulty of Paying Living Expenses:   Food Insecurity:   . Worried About Programme researcher, broadcasting/film/video in the Last Year:   . Barista in the Last Year:   Transportation Needs:   . Freight forwarder (Medical):   Marland Kitchen Lack of Transportation (Non-Medical):   Physical Activity:   . Days of Exercise per Week:   . Minutes of Exercise per Session:   Stress:   . Feeling of Stress :   Social Connections:   . Frequency of Communication with Friends and Family:   . Frequency of Social Gatherings with Friends and Family:   . Attends Religious Services:   . Active Member of Clubs or Organizations:   . Attends Banker Meetings:   Marland Kitchen Marital Status:      Family History: The patient's family history is not on  file.  ROS:   Please see the history of present illness.     All other systems reviewed and are negative.  EKGs/Labs/Other Studies Reviewed:    The following studies were reviewed today: Cath 11/30/2019  EKG:  EKG is ordered today.  The ekg ordered today demonstrates NSR, 65, poor anterior RW  Recent Labs: 06/17/2019: B Natriuretic Peptide 172.0 10/26/2019: ALT 13 11/26/2019: BUN 22; Creatinine, Ser 0.99; Hemoglobin 14.9; Platelets 283; Potassium 5.2; Sodium 138  Recent Lipid Panel    Component Value Date/Time   CHOL 115 10/26/2019  1779   TRIG 115 10/26/2019 0811   HDL 40 10/26/2019 0811   CHOLHDL 2.9 10/26/2019 0811   CHOLHDL 5.2 06/18/2019 0348   VLDL 29 06/18/2019 0348   LDLCALC 54 10/26/2019 0811    Physical Exam:    VS:  BP 134/87   Pulse 83   Ht 5\' 3"  (1.6 m)   Wt 255 lb (115.7 kg)   SpO2 98%   BMI 45.17 kg/m     Wt Readings from Last 3 Encounters:  12/30/19 255 lb (115.7 kg)  11/30/19 253 lb (114.8 kg)  11/25/19 251 lb (113.9 kg)     GEN: Obese, chronically ill appearing Caucasian female,  in no acute distress HEENT: Normal NECK: No JVD; No carotid bruits CARDIAC: RRR, no murmurs, rubs, gallops RESPIRATORY:  Clear to auscultation without rales, wheezing or rhonchi  ABDOMEN: Obese, non-distended MUSCULOSKELETAL:  No edema; No deformity, minimal ecchymosis Rt radial site  SKIN: Warm and dry NEUROLOGIC:  Alert and oriented x 3 PSYCHIATRIC:  Normal affect   ASSESSMENT:    Chest pain with moderate risk for cardiac etiology She is still having some chest pain that's concerning for angina  CAD S/P PCI 2 site RCA PCI with DES 06/18/2019. Residual 70% LAD- medical Rx.  Normal LVF by echo Re look cath for chest pain April 2021- RCA stents patent-mLAD 70%  Chronic pain S/P multiple abdominal surgeries- followed by PCP and Dr May 2021  Essential hypertension Controlled- severe LVH on echo  History of non-ST elevation myocardial infarction (NSTEMI) Pt  presented 06/17/2019 with NSTEMI  Morbid obesity (HCC) BMI 45  History of DVT of lower extremity Lifelong Xarelto  Sleep apnea Unable to tolerate C-pap, on O2 PRN at home  PLAN:    Increase Imdur to 60 mg daily- virtual f/u in 2-3 weeks.    Medication Adjustments/Labs and Tests Ordered: Current medicines are reviewed at length with the patient today.  Concerns regarding medicines are outlined above.  Orders Placed This Encounter  Procedures  . EKG 12-Lead   Meds ordered this encounter  Medications  . isosorbide mononitrate (IMDUR) 60 MG 24 hr tablet    Sig: Take 1 tablet (60 mg total) by mouth daily.    Dispense:  90 tablet    Refill:  0    Patient Instructions  Medication Instructions:  INCREASE the (Isosobide) Imdur to 60 mg once daily  *If you need a refill on your cardiac medications before your next appointment, please call your pharmacy*   Lab Work: None ordered If you have labs (blood work) drawn today and your tests are completely normal, you will receive your results only by: 06/19/2019 MyChart Message (if you have MyChart) OR . A paper copy in the mail If you have any lab test that is abnormal or we need to change your treatment, we will call you to review the results.   Testing/Procedures: None ordered   Follow-Up: At [facility], you and your health needs are our priority.  As part of our continuing mission to provide you with exceptional heart care, we have created designated Provider Care Teams.  These Care Teams include your primary Cardiologist (physician) and Advanced Practice Providers (APPs -  Physician Assistants and Nurse Practitioners) who all work together to provide you with the care you need, when you need it.  We recommend signing up for the patient portal called "MyChart".  Sign up information is provided on this After Visit Summary.  MyChart is used to connect with patients  for Virtual Visits (Telemedicine).  Patients are able to view lab/test  results, encounter notes, upcoming appointments, etc.  Non-urgent messages can be sent to your provider as well.   To learn more about what you can do with MyChart, go to [email or web address].    Your next appointment:   Follow up with Corine Shelter, PA for a virtual visit at 10:45 am.     Signed, Corine Shelter, PA-C  12/30/2019 9:58 AM    [facility]

## 2019-12-30 NOTE — Assessment & Plan Note (Signed)
BMI 45 

## 2019-12-30 NOTE — Assessment & Plan Note (Signed)
Lifelong Xarelto 

## 2019-12-30 NOTE — Assessment & Plan Note (Signed)
She is still having some chest pain that's concerning for angina

## 2019-12-30 NOTE — Assessment & Plan Note (Signed)
Pt presented 06/17/2019 with NSTEMI

## 2019-12-30 NOTE — Assessment & Plan Note (Signed)
S/P multiple abdominal surgeries- followed by PCP and Dr Marca Ancona

## 2019-12-30 NOTE — Assessment & Plan Note (Signed)
Unable to tolerate C-pap, on O2 PRN at home

## 2019-12-30 NOTE — Patient Instructions (Signed)
Medication Instructions:  INCREASE the (Isosobide) Imdur to 60 mg once daily  *If you need a refill on your cardiac medications before your next appointment, please call your pharmacy*   Lab Work: None ordered If you have labs (blood work) drawn today and your tests are completely normal, you will receive your results only by: Marland Kitchen MyChart Message (if you have MyChart) OR . A paper copy in the mail If you have any lab test that is abnormal or we need to change your treatment, we will call you to review the results.   Testing/Procedures: None ordered   Follow-Up: At [facility], you and your health needs are our priority.  As part of our continuing mission to provide you with exceptional heart care, we have created designated Provider Care Teams.  These Care Teams include your primary Cardiologist (physician) and Advanced Practice Providers (APPs -  Physician Assistants and Nurse Practitioners) who all work together to provide you with the care you need, when you need it.  We recommend signing up for the patient portal called "MyChart".  Sign up information is provided on this After Visit Summary.  MyChart is used to connect with patients for Virtual Visits (Telemedicine).  Patients are able to view lab/test results, encounter notes, upcoming appointments, etc.  Non-urgent messages can be sent to your provider as well.   To learn more about what you can do with MyChart, go to [email or web address].    Your next appointment:   Follow up with Corine Shelter, PA for a virtual visit at 10:45 am.

## 2019-12-30 NOTE — Assessment & Plan Note (Signed)
Controlled- severe LVH on echo

## 2019-12-30 NOTE — Assessment & Plan Note (Signed)
2 site RCA PCI with DES 06/18/2019. Residual 70% LAD- medical Rx.  Normal LVF by echo Re look cath for chest pain April 2021- RCA stents patent-mLAD 70%

## 2020-01-17 ENCOUNTER — Ambulatory Visit: Payer: Medicare Other

## 2020-01-21 ENCOUNTER — Telehealth (INDEPENDENT_AMBULATORY_CARE_PROVIDER_SITE_OTHER): Payer: Medicare Other | Admitting: Cardiology

## 2020-01-21 ENCOUNTER — Encounter: Payer: Self-pay | Admitting: Cardiology

## 2020-01-21 VITALS — Ht 63.0 in | Wt 255.0 lb

## 2020-01-21 DIAGNOSIS — G8929 Other chronic pain: Secondary | ICD-10-CM | POA: Diagnosis not present

## 2020-01-21 DIAGNOSIS — N1831 Chronic kidney disease, stage 3a: Secondary | ICD-10-CM

## 2020-01-21 DIAGNOSIS — Z9861 Coronary angioplasty status: Secondary | ICD-10-CM | POA: Diagnosis not present

## 2020-01-21 DIAGNOSIS — I2510 Atherosclerotic heart disease of native coronary artery without angina pectoris: Secondary | ICD-10-CM

## 2020-01-21 DIAGNOSIS — R079 Chest pain, unspecified: Secondary | ICD-10-CM | POA: Diagnosis not present

## 2020-01-21 DIAGNOSIS — G4733 Obstructive sleep apnea (adult) (pediatric): Secondary | ICD-10-CM

## 2020-01-21 DIAGNOSIS — I10 Essential (primary) hypertension: Secondary | ICD-10-CM

## 2020-01-21 DIAGNOSIS — Z86718 Personal history of other venous thrombosis and embolism: Secondary | ICD-10-CM

## 2020-01-21 DIAGNOSIS — E785 Hyperlipidemia, unspecified: Secondary | ICD-10-CM

## 2020-01-21 NOTE — Patient Instructions (Signed)
Your physician recommends that you continue on your current medications as directed. Please refer to the Current Medication list given to you today.   Your physician recommends that you schedule a follow-up appointment in:  3  MONTHS  WITH DR  CROITORU 

## 2020-01-21 NOTE — Progress Notes (Signed)
Virtual Visit via Telephone Note   This visit type was conducted due to national recommendations for restrictions regarding the COVID-19 Pandemic (e.g. social distancing) in an effort to limit this patient's exposure and mitigate transmission in our community.  Due to her co-morbid illnesses, this patient is at least at moderate risk for complications without adequate follow up.  This format is felt to be most appropriate for this patient at this time.  The patient did not have access to video technology/had technical difficulties with video requiring transitioning to audio format only (telephone).  All issues noted in this document were discussed and addressed.  No physical exam could be performed with this format.  Please refer to the patient's chart for her  consent to telehealth for [facility].   The patient was identified using 2 identifiers.  Date:  01/21/2020   ID:  Mary Ferguson, DOB 03/19/1951, MRN [id number]  Patient Location: Home Provider Location: Home  PCP:  Merlene Laughter, MD  Cardiologist:  Thurmon Fair, MD  Electrophysiologist:  None   Evaluation Performed:  Follow-Up Visit  Chief Complaint:  none  History of Present Illness:    Mary Ferguson is a 69 y.o. female with a hx of chronic abdominal and back pain. She has had multiple abdominal surgeries.  Dr Marca Ancona follows her. She had previously been followed at a pain clinic but is now off opiates. Shepresented 06/17/2019 with multiple complaints including chest pain. She tells me she thinks she had COVID then despite a negative rapid test as she has had persistent loss of taste and smell and fatigue.Herwork up revealeda HS Troponin of 12k.Marland Kitchen Cathwasdone 10/23/2020whichshowed RCA disease withahigh grade mRCA stenosis and high grade dRCA stenosis. She was treated with PCI/ DESx 2. She had residual disease in themLAD of 70%. The plan is for medical Rx. Her LVF was 40% at cath but her echo showed her EF to be  60-65% with severe LVH. 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   She was seen in F/U in March 2021.  She c/o of chest pain concerning for angina and Imdur was added.  At f/u April 1st 2021 she continued to c/o of chest pain and diagnostic cath was arranged.  This revealed patent RCA stents and unchanged 70% mLAD. She was seen in the office 12/30/2019 and c/o chest pain with some typical and atypical symptoms.  Imdur was added and she was contacted today for follow up.  Since I saw her last she feels like she has improved on the Imdur.  She has had two episodes of chest discomfort and fatigue but today feels "pretty good".  She thought she might have to go back to the ED 5 days ago. She wonders if her symptoms may be secondary to COVID she believes she had in Nov.    The patient does not have symptoms concerning for COVID-19 infection (fever, chills, cough, or new shortness of breath).    Past Medical History:  Diagnosis Date  . Asthma   . Chronic generalized abdominal pain   . Chronic pain   . CKD (chronic kidney disease), stage III   . Hypertension   . Morbid obesity (HCC)   . Recurrent deep vein thrombosis (DVT) (HCC)    Past Surgical History:  Procedure Laterality Date  . CORONARY STENT INTERVENTION N/A 06/18/2019   Procedure: CORONARY STENT INTERVENTION;  Surgeon: Iran Ouch, MD;  Location: [facility];  Service: Cardiovascular;  Laterality: N/A;  . EYE SURGERY    . HERNIA REPAIR    . LEFT HEART CATH AND CORONARY ANGIOGRAPHY N/A 06/18/2019   Procedure: LEFT HEART CATH AND CORONARY ANGIOGRAPHY;  Surgeon: Iran Ouch, MD;  Location: [facility];  Service: Cardiovascular;  Laterality: N/A;  . LEFT HEART CATH AND CORONARY ANGIOGRAPHY N/A 11/30/2019   Procedure: LEFT HEART CATH AND CORONARY ANGIOGRAPHY;  Surgeon: Lennette Bihari, MD;  Location: [facility];  Service:  Cardiovascular;  Laterality: N/A;  . VEIN SURGERY       Current Meds  Medication Sig  . acetaminophen (TYLENOL) 500 MG tablet Take 500 mg by mouth every 6 (six) hours as needed for mild pain or moderate pain.  Marland Kitchen atorvastatin (LIPITOR) 80 MG tablet Take 1 tablet (80 mg total) by mouth every evening.  . carvedilol (COREG) 6.25 MG tablet Take 1 tablet (6.25 mg total) by mouth 2 (two) times daily.  . clobetasol ointment (TEMOVATE) 0.05 % Apply 1 application topically daily as needed (Knots on face).   . clopidogrel (PLAVIX) 75 MG tablet Take 1 tablet (75 mg total) by mouth daily with breakfast.  . dicyclomine (BENTYL) 20 MG tablet Take 20 mg by mouth 4 (four) times daily as needed for spasms.   . diphenoxylate-atropine (LOMOTIL) 2.5-0.025 MG tablet Take 1 tablet by mouth 4 (four) times daily as needed for diarrhea or loose stools.  . hydrOXYzine (ATARAX/VISTARIL) 25 MG tablet Take 25 mg by mouth every 6 (six) hours as needed for itching.   . isosorbide mononitrate (IMDUR) 60 MG 24 hr tablet Take 1 tablet (60 mg total) by mouth daily.  . metroNIDAZOLE (METROCREAM) 0.75 % cream Apply 1 application topically daily as needed (Itching).   . nitroGLYCERIN (NITROSTAT) 0.4 MG SL tablet Place 1 tablet (0.4 mg total) under the tongue every 5 (five) minutes x 3 doses as needed for chest pain.  . promethazine (PHENERGAN) 25 MG tablet Take 25 mg by mouth daily as needed for nausea.   . psyllium (METAMUCIL SMOOTH TEXTURE) 28 % packet Take 1 packet by mouth 3 (three) times daily as needed (constipation). unflavored sugar-free  . rivaroxaban (XARELTO) 20 MG TABS tablet Take 1 tablet (20 mg total) by mouth every evening.     Allergies:   Rofecoxib, Tetracyclines & related, Benadryl allergy [diphenhydramine hcl], Cephalexin, and Gatifloxacin   Social History   Tobacco Use  . Smoking status: Former Games developer  . Smokeless tobacco: Never Used  Substance Use Topics  . Alcohol use: Never  . Drug use: Never      Family Hx: The patient's family history is not on file.  ROS:   Please see the history of present illness.    All other systems reviewed and are negative.   Prior CV studies:   The following studies were reviewed today: Cath April [id number]  Labs/Other Tests and Data Reviewed:    EKG:  No ECG reviewed.  Recent Labs: 06/17/2019: B Natriuretic Peptide 172.0 10/26/2019: ALT 13 11/26/2019: BUN 22; Creatinine, Ser 0.99; Hemoglobin 14.9; Platelets 283; Potassium 5.2; Sodium 138   Recent Lipid Panel Lab Results  Component Value Date/Time   CHOL 115 10/26/2019 08:11 AM   TRIG 115 10/26/2019 08:11 AM   HDL 40 10/26/2019 08:11 AM   CHOLHDL 2.9 10/26/2019 08:11 AM   CHOLHDL 5.2 06/18/2019 03:48 AM   LDLCALC 54 10/26/2019 08:11 AM    Wt Readings from Last 3 Encounters:  01/21/20  255 lb (115.7 kg)  12/30/19 255 lb (115.7 kg)  11/30/19 253 lb (114.8 kg)     Objective:    Vital Signs:  Ht 5\' 3"  (1.6 m)   Wt 255 lb (115.7 kg)   BMI 45.17 kg/m    VITAL SIGNS:  reviewed  ASSESSMENT & PLAN:    Chest pain with moderate risk for cardiac etiology She did have some improvement with Imdur- no change in Rx.   CAD S/P PCI 2 site RCA PCI with DES 06/18/2019. Residual 70% LAD- medical Rx.  Normal LVF by echo Re look cath for chest pain April 2021- RCA stents patent-mLAD 70%  Chronic pain S/P multiple abdominal surgeries- followed by PCP and Dr Therisa Doyne  Essential hypertension Controlled- severe LVH on echo  History of non-ST elevation myocardial infarction (NSTEMI) Pt presented 06/17/2019 with NSTEMI  Morbid obesity ([place]) BMI 45  History of DVT of lower extremity Lifelong Xarelto  Sleep apnea Unable to tolerate C-pap, on O2 PRN at home  ? COVID Nov 2020- Negative test but loss of taste and smell per her history  Plan: Same Rx- f/u with dr C or myself in 3 months.  COVID-19 Education: The signs and symptoms of COVID-19 were discussed with the patient and how to seek  care for testing (follow up with PCP or arrange E-visit).  The importance of social distancing was discussed today.  Time:   Today, I have spent 15 minutes with the patient with telehealth technology discussing the above problems.     Medication Adjustments/Labs and Tests Ordered: Current medicines are reviewed at length with the patient today.  Concerns regarding medicines are outlined above.   Tests Ordered: No orders of the defined types were placed in this encounter.   Medication Changes: No orders of the defined types were placed in this encounter.   Follow Up:  In Person in 3 months  Signed, Kerin Ransom, Hershal Coria  01/21/2020 10:40 AM    [place]

## 2020-01-29 NOTE — Progress Notes (Signed)
Thanks

## 2020-03-14 ENCOUNTER — Other Ambulatory Visit: Payer: Self-pay | Admitting: Cardiology

## 2020-03-14 ENCOUNTER — Other Ambulatory Visit: Payer: Self-pay

## 2020-03-14 MED ORDER — ISOSORBIDE MONONITRATE ER 60 MG PO TB24: 60.0000 mg | ORAL_TABLET | Freq: Every day | ORAL | 0 refills | Status: DC

## 2020-03-16 ENCOUNTER — Telehealth: Payer: Self-pay | Admitting: Cardiology

## 2020-03-16 NOTE — Telephone Encounter (Signed)
Patient called about her atorvastatin (LIPITOR) 80 MG tablet. And her next appt.

## 2020-04-19 ENCOUNTER — Encounter: Payer: Self-pay | Admitting: Cardiovascular Disease

## 2020-04-19 ENCOUNTER — Ambulatory Visit (INDEPENDENT_AMBULATORY_CARE_PROVIDER_SITE_OTHER): Payer: Medicare Other | Admitting: Cardiovascular Disease

## 2020-04-19 ENCOUNTER — Other Ambulatory Visit: Payer: Self-pay

## 2020-04-19 VITALS — BP 140/99 | HR 104 | Ht 63.0 in | Wt 254.6 lb

## 2020-04-19 DIAGNOSIS — I10 Essential (primary) hypertension: Secondary | ICD-10-CM | POA: Diagnosis not present

## 2020-04-19 DIAGNOSIS — I2510 Atherosclerotic heart disease of native coronary artery without angina pectoris: Secondary | ICD-10-CM | POA: Diagnosis not present

## 2020-04-19 DIAGNOSIS — E669 Obesity, unspecified: Secondary | ICD-10-CM

## 2020-04-19 DIAGNOSIS — E1169 Type 2 diabetes mellitus with other specified complication: Secondary | ICD-10-CM

## 2020-04-19 DIAGNOSIS — I25118 Atherosclerotic heart disease of native coronary artery with other forms of angina pectoris: Secondary | ICD-10-CM | POA: Diagnosis not present

## 2020-04-19 DIAGNOSIS — Z86718 Personal history of other venous thrombosis and embolism: Secondary | ICD-10-CM

## 2020-04-19 DIAGNOSIS — Z9861 Coronary angioplasty status: Secondary | ICD-10-CM

## 2020-04-19 DIAGNOSIS — E782 Mixed hyperlipidemia: Secondary | ICD-10-CM

## 2020-04-19 MED ORDER — CARVEDILOL 12.5 MG PO TABS: 12.5000 mg | ORAL_TABLET | Freq: Two times a day (BID) | ORAL | 11 refills | Status: DC

## 2020-04-19 MED ORDER — CLOPIDOGREL BISULFATE 75 MG PO TABS: 75.0000 mg | ORAL_TABLET | Freq: Every day | ORAL | 3 refills | Status: DC

## 2020-04-19 NOTE — Progress Notes (Signed)
Cardiology Office Note   Date:  04/21/2020   ID:  Mary Ferguson, DOB July 15, 1951, MRN [id number]  PCP:  Merlene Laughter, MD  Cardiologist:  Thurmon Fair, MD  Electrophysiologist:  None   Evaluation Performed:  Follow-Up Visit  Chief Complaint: Chest pain  History of Present Illness:    Mary Ferguson is a 69 y.o. female with morbid obesity, DM, HTN, mixed hyperlipidemia, history of recurrent DVT, CKD 3a and chronic pain, diagnosed with coronary artery disease when she presented with inferior wall ST segment elevation myocardial infarction in October 2020 and received drug-eluting stents to the mid and distal right coronary artery (Dr. Kirke Corin, resolute Onyx 3.5x26 and 3.0x18).  At that time she was also noted to have a 70% mid LAD stenosis that appeared to be chronic.  She had transient LV dysfunction with an EF of about 40% at the time of LV angiography, 60-65% on follow-up echocardiogram the next day.  At the time of cath LV EDP was mildly elevated at 22 mmHg. Repeat angiography in April 2021 showed no change.  She feels poorly, mostly due to generalized weakness and severe lower back pain.  She has had a couple of episodes of chest pain, sharp, brief, radiating through to her back, positional.  They do not sound like angina.  The patient denies chest pain with exertion, dyspnea at rest or with exertion, orthopnea, paroxysmal nocturnal dyspnea, syncope, palpitations, focal neurological deficits, intermittent claudication, lower extremity edema, unexplained weight gain, cough, hemoptysis or wheezing.  She bears a diagnosis of CKD stage III.  Her creatinine has been in the 1.0-1.2 range consistently.  She has a history of recurrent DVT and is on chronic treatment with Xarelto.  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  She was on chronic opiate  therapy for a while, but her quality of life deteriorated substantially so she stopped taking them and is very afraid of becoming addicted to them in the future.   Past Medical History:  Diagnosis Date  . Asthma   . Chronic generalized abdominal pain   . Chronic pain   . CKD (chronic kidney disease), stage III   . Hypertension   . Morbid obesity (HCC)   . Recurrent deep vein thrombosis (DVT) (HCC)    Past Surgical History:  Procedure Laterality Date  . CORONARY STENT INTERVENTION N/A 06/18/2019   Procedure: CORONARY STENT INTERVENTION;  Surgeon: Iran Ouch, MD;  Location: [facility];  Service: Cardiovascular;  Laterality: N/A;  . EYE SURGERY    . HERNIA REPAIR    . LEFT HEART CATH AND CORONARY ANGIOGRAPHY N/A 06/18/2019   Procedure: LEFT HEART CATH AND CORONARY ANGIOGRAPHY;  Surgeon: Iran Ouch, MD;  Location: [facility];  Service: Cardiovascular;  Laterality: N/A;  . LEFT HEART CATH AND CORONARY ANGIOGRAPHY N/A 11/30/2019   Procedure: LEFT HEART CATH AND CORONARY ANGIOGRAPHY;  Surgeon: Lennette Bihari, MD;  Location: [facility];  Service: Cardiovascular;  Laterality: N/A;  . VEIN SURGERY       Current Meds  Medication Sig  . acetaminophen (TYLENOL) 500 MG tablet Take 500 mg by mouth every 6 (six) hours as needed for mild pain or moderate pain.  Marland Kitchen atorvastatin (LIPITOR) 80 MG tablet Take 1 tablet (80 mg total) by mouth every evening.  . carvedilol (COREG) 12.5 MG tablet Take 1  tablet (12.5 mg total) by mouth 2 (two) times daily.  . clobetasol ointment (TEMOVATE) 0.05 % Apply 1 application topically daily as needed (Knots on face).   . clopidogrel (PLAVIX) 75 MG tablet Take 1 tablet (75 mg total) by mouth daily with breakfast.  . dicyclomine (BENTYL) 20 MG tablet Take 20 mg by mouth 4 (four) times daily as needed for spasms.   . diphenoxylate-atropine (LOMOTIL) 2.5-0.025 MG tablet Take 1 tablet by mouth 4 (four) times daily as needed for diarrhea  or loose stools.  . hydrOXYzine (ATARAX/VISTARIL) 25 MG tablet Take 25 mg by mouth every 6 (six) hours as needed for itching.   . isosorbide mononitrate (IMDUR) 60 MG 24 hr tablet Take 1 tablet (60 mg total) by mouth daily.  . metroNIDAZOLE (METROCREAM) 0.75 % cream Apply 1 application topically daily as needed (Itching).   . nitroGLYCERIN (NITROSTAT) 0.4 MG SL tablet Place 1 tablet (0.4 mg total) under the tongue every 5 (five) minutes x 3 doses as needed for chest pain.  . promethazine (PHENERGAN) 25 MG tablet Take 25 mg by mouth daily as needed for nausea.   . psyllium (METAMUCIL SMOOTH TEXTURE) 28 % packet Take 1 packet by mouth 3 (three) times daily as needed (constipation). unflavored sugar-free  . rivaroxaban (XARELTO) 20 MG TABS tablet Take 1 tablet (20 mg total) by mouth every evening.  . [DISCONTINUED] carvedilol (COREG) 6.25 MG tablet Take 1 tablet (6.25 mg total) by mouth 2 (two) times daily.  . [DISCONTINUED] clopidogrel (PLAVIX) 75 MG tablet Take 1 tablet (75 mg total) by mouth daily with breakfast.     Allergies:   Rofecoxib, Tetracyclines & related, Benadryl allergy [diphenhydramine hcl], Cephalexin, and Gatifloxacin   Social History   Tobacco Use  . Smoking status: Former Games developer  . Smokeless tobacco: Never Used  Vaping Use  . Vaping Use: Never used  Substance Use Topics  . Alcohol use: Never  . Drug use: Never     Family Hx: The patient's family history is significant for the absence of premature onset CAD or PAD. ROS:   Please see the history of present illness.    All other systems are reviewed and are negative.   Prior CV studies:   The following studies were reviewed today:  Cardiac catheterization 06/18/2019  Diagnostic Dominance: Right  Intervention   Implants   Permanent Stent  Stent Resolute Onyx 3.5x26 - [id number] - Implanted  Inventory item: Dreama Saa 5.0P54 Model/Cat number: [id number]  Manufacturer: [employer]  Lot number: [id number]  Device identifier: [id number] Device identifier type: GS1  Area Of Implantation: Mid RCA        Cardiac cath 11/30/2019   Previously placed Prox RCA to Mid RCA stent (unknown type) is widely patent.  Previously placed Dist RCA stent (unknown type) is widely patent.  Prox Cx to Mid Cx lesion is 30% stenosed.  Prox LAD lesion is 30% stenosed.  Mid LAD-1 lesion is 40% stenosed.  Mid LAD-2 lesion is 70% stenosed.  Dist LAD lesion is 40% stenosed.   Widely patent mid and distal RCA stents in the dominant RCA vessel.  Mild to moderate concomitant CAD with 30 and 40% proximal LAD stenoses, no change in the previously noted focal 70% mid LAD stenosis with 40% narrowing beyond this segment; normal small ramus intermediate vessel and tortuous circumflex vessel with 30% proximal narrowing.  LVEDP 8 mmHg  Diagnostic Dominance: Right      Labs/Other Tests and Data Reviewed:  CARDIAC CATH 11/30/2019: Diagnostic Dominance: Right    EKG:  Ordered today shows borderline sinus tachycardia, left axis deviation with pulmonary disease pattern, no ischemic repolarization changes, QTC 450 ms.  Recent Labs: 06/17/2019: B Natriuretic Peptide 172.0 10/26/2019: ALT 13 11/26/2019: BUN 22; Creatinine, Ser 0.99; Hemoglobin 14.9; Platelets 283; Potassium 5.2; Sodium 138   Recent Lipid Panel Lab Results  Component Value Date/Time   CHOL 115 10/26/2019 08:11 AM   TRIG 115 10/26/2019 08:11 AM   HDL 40 10/26/2019 08:11 AM   CHOLHDL 2.9 10/26/2019 08:11 AM   CHOLHDL 5.2 06/18/2019 03:48 AM   LDLCALC 54 10/26/2019 08:11 AM    Wt Readings from Last 3 Encounters:  04/19/20 254 lb 9.6 oz (115.5 kg)  01/21/20 255 lb (115.7 kg)  12/30/19 255 lb (115.7 kg)     Objective:    Vital Signs:  BP (!) 140/99   Pulse (!) 104   Ht 5\' 3"  (1.6 m)   Wt 254 lb 9.6 oz (115.5 kg)   SpO2 95%   BMI 45.10 kg/m     General: Alert, oriented x3, no distress, morbidly  obese Head: no evidence of trauma, PERRL, EOMI, no exophtalmos or lid lag, no myxedema, no xanthelasma; normal ears, nose and oropharynx Neck: normal jugular venous pulsations and no hepatojugular reflux; brisk carotid pulses without delay and no carotid bruits Chest: clear to auscultation, no signs of consolidation by percussion or palpation, normal fremitus, symmetrical and full respiratory excursions Cardiovascular: normal position and quality of the apical impulse, regular rhythm, normal first and second heart sounds, no murmurs, rubs or gallops Abdomen: no tenderness or distention, no masses by palpation, no abnormal pulsatility or arterial bruits, normal bowel sounds, no hepatosplenomegaly Extremities: no clubbing, cyanosis or edema; 2+ radial, ulnar and brachial pulses bilaterally; 2+ right femoral, posterior tibial and dorsalis pedis pulses; 2+ left femoral, posterior tibial and dorsalis pedis pulses; no subclavian or femoral bruits Neurological: grossly nonfocal Psych: Normal mood and affect   ASSESSMENT & PLAN:    1. Coronary artery disease involving native coronary artery of native heart with other form of angina pectoris (HCC)   2. Mixed hyperlipidemia   3. Diabetes mellitus type 2 in obese (HCC)   4. History of DVT of lower extremity   5. Essential hypertension   6. Morbid obesity (HCC)      1. CAD: She has had some chest discomfort, but it does not sound like angina.  On 2 antianginal medications.  Repeat cardiac catheterization in April 2021 did not show any evidence of in-stent restenosis or progression of disease.  On dual antiplatelet therapy, will stop clopidogrel in October.  Continue beta-blocker and long-acting nitrate. 2. HLP: All lipid parameters are within desirable change based on labs from April 2021. 3. DM: Good glycemic control with most recent hemoglobin A1c 6.6%.  Normal creatinine 1.07 4. Hx of Recurrent DVT: We will continue lifelong Xarelto, but would  recommend dropping it to a prophylactic dose of 10 mg daily. 5. HTN: Increase carvedilol dose to 12.5 mg twice daily. 6. Morbid obesity: Unable to exercise.  Only way to his weight is through caloric restriction.  COVID-19 Education: The signs and symptoms of COVID-19 were discussed with the patient and how to seek care for testing (follow up with PCP or arrange E-visit).  The importance of social distancing was discussed today.  Patient Instructions  Medication Instructions:  INCREASE the Carvedilol to 12.5 mg twice daily STOP the Plavix on 06/17/20  *If you  need a refill on your cardiac medications before your next appointment, please call your pharmacy*   Lab Work: None ordered If you have labs (blood work) drawn today and your tests are completely normal, you will receive your results only by: Marland Kitchen. MyChart Message (if you have MyChart) OR . A paper copy in the mail If you have any lab test that is abnormal or we need to change your treatment, we will call you to review the results.   Testing/Procedures: None ordered   Follow-Up: At [facility], you and your health needs are our priority.  As part of our continuing mission to provide you with exceptional heart care, we have created designated Provider Care Teams.  These Care Teams include your primary Cardiologist (physician) and Advanced Practice Providers (APPs -  Physician Assistants and Nurse Practitioners) who all work together to provide you with the care you need, when you need it.  We recommend signing up for the patient portal called "MyChart".  Sign up information is provided on this After Visit Summary.  MyChart is used to connect with patients for Virtual Visits (Telemedicine).  Patients are able to view lab/test results, encounter notes, upcoming appointments, etc.  Non-urgent messages can be sent to your provider as well.   To learn more about what you can do with MyChart, go to [email or web address].    Your next  appointment:   8 month(s)  The format for your next appointment:   In Person  Provider:   You may see Thurmon FairMihai Lorimer Tiberio, MD or one of the following Advanced Practice Providers on your designated Care Team:    Azalee CourseHao Meng, PA-C  Micah FlesherAngela Duke, [place]-C or   Judy PimpleKrista Kroeger, PA-C      Signed, Thurmon FairMihai Azarias Chiou, MD  04/21/2020 2:26 PM    [facility]

## 2020-04-19 NOTE — Patient Instructions (Signed)
Medication Instructions:  INCREASE the Carvedilol to 12.5 mg twice daily STOP the Plavix on 06/17/20  *If you need a refill on your cardiac medications before your next appointment, please call your pharmacy*   Lab Work: None ordered If you have labs (blood work) drawn today and your tests are completely normal, you will receive your results only by:  MyChart Message (if you have MyChart) OR  A paper copy in the mail If you have any lab test that is abnormal or we need to change your treatment, we will call you to review the results.   Testing/Procedures: None ordered   Follow-Up: At [facility], you and your health needs are our priority.  As part of our continuing mission to provide you with exceptional heart care, we have created designated Provider Care Teams.  These Care Teams include your primary Cardiologist (physician) and Advanced Practice Providers (APPs -  Physician Assistants and Nurse Practitioners) who all work together to provide you with the care you need, when you need it.  We recommend signing up for the patient portal called "MyChart".  Sign up information is provided on this After Visit Summary.  MyChart is used to connect with patients for Virtual Visits (Telemedicine).  Patients are able to view lab/test results, encounter notes, upcoming appointments, etc.  Non-urgent messages can be sent to your provider as well.   To learn more about what you can do with MyChart, go to [email or web address].    Your next appointment:   8 month(s)  The format for your next appointment:   In Person  Provider:   You may see Thurmon Fair, MD or one of the following Advanced Practice Providers on your designated Care Team:    Azalee Course, PA-C  Micah Flesher, PA-C or   Judy Pimple, [place]

## 2020-05-18 ENCOUNTER — Telehealth: Payer: Self-pay | Admitting: Cardiovascular Disease

## 2020-05-18 NOTE — Telephone Encounter (Signed)
Will forward to Dr Royann Shivers for review Reviewed pt's chart and do not set any reason pt needs antibiotic prior to cleaning ./

## 2020-05-18 NOTE — Telephone Encounter (Signed)
She does not need antibiotics for dental procedures, at least not from a Cardiology point of view.

## 2020-05-18 NOTE — Telephone Encounter (Signed)
Pt's son aware of answer ./cy

## 2020-05-18 NOTE — Telephone Encounter (Signed)
  Patient called and states she was not able to have her teeth cleaned today. She needs to be on an antibiotic prior to her cleaning. She states can not take Keflex due to an allergy to the medication . She is not sure what antibiotics Dr. Salena Saner has put her on in the past. There has not been a date set yet for the patient to have her teeth cleaned. She would like to have the medication on hand to be ready to take if an opening comes up.  Please contact the patient's son Gerilyn Pilgrim for any other medical needs.  The patient still uses the [id number] - [place], [place] - [address]

## 2020-06-15 ENCOUNTER — Telehealth: Payer: Self-pay | Admitting: Cardiovascular Disease

## 2020-06-15 NOTE — Telephone Encounter (Signed)
*  STAT* If patient is at the pharmacy, call can be transferred to refill team.   1. Which medications need to be refilled? (please list name of each medication and dose if known)  atorvastatin (LIPITOR) 80 MG tablet carvedilol (COREG) 12.5 MG tablet clopidogrel (PLAVIX) 75 MG tablet rivaroxaban (XARELTO) 20 MG TABS tablet isosorbide mononitrate (IMDUR) 60 MG 24 hr tablet nitroGLYCERIN (NITROSTAT) 0.4 MG SL tablet 2. Which pharmacy/location (including street and city if local pharmacy) is medication to be sent to? [id number] - [place], [place] - [address]  3. Do they need a 30 day or 90 day supply? 90 day supply

## 2020-06-15 NOTE — Telephone Encounter (Signed)
The patient has been made aware that she can stop the Plavix as of 06/17/20. She has verbalized her understanding.

## 2020-06-15 NOTE — Telephone Encounter (Signed)
Patient states during last office visit she was told to stop taking one of her medications. However, she does not remember the name of the medication she is to discontinue. please advise.

## 2020-06-16 NOTE — Telephone Encounter (Signed)
Pt is calling back due to the pharmacy stating the have not received the refills. Please advise.

## 2020-06-18 ENCOUNTER — Other Ambulatory Visit: Payer: Self-pay | Admitting: Cardiology

## 2020-06-19 ENCOUNTER — Other Ambulatory Visit: Payer: Self-pay

## 2020-06-19 NOTE — Telephone Encounter (Signed)
*  STAT* If patient is at the pharmacy, call can be transferred to refill team.   1. Which medications need to be refilled? (please list name of each medication and dose if known)  isosorbide mononitrate (IMDUR) 60 MG 24 hr tablet  2. Which pharmacy/location (including street and city if local pharmacy) is medication to be sent to?  [id number] - [place], [place] - [address]   3. Do they need a 30 day or 90 day supply? 90   Patient stated her pharmacy only filled a 90 day supply of the 30 mg tablets which was her old rx. She said back in July her rx was changed to 60 mg  She needs [place] to send in the correct rx   All other medications from the request were correct

## 2020-07-17 LAB — HEPATIC FUNCTION PANEL
ALT: 11 (ref 7–35)
AST: 14 (ref 13–35)
Alkaline Phosphatase: 101 (ref 25–125)
Bilirubin, Total: 1.1

## 2020-07-17 LAB — CBC AND DIFFERENTIAL
HCT: 44 (ref 36–46)
Hemoglobin: 14.9 (ref 12.0–16.0)
Neutrophils Absolute: 6.9
Platelets: 310 (ref 150–399)
WBC: 9.9

## 2020-07-17 LAB — CBC: RBC: 5.52 — AB (ref 3.87–5.11)

## 2020-07-17 LAB — BASIC METABOLIC PANEL WITH GFR
BUN: 17 (ref 4–21)
CO2: 27 — AB (ref 13–22)
Chloride: 106 (ref 99–108)
Creatinine: 1.3 — AB (ref 0.5–1.1)
Glucose: 120
Potassium: 3.7 (ref 3.4–5.3)
Sodium: 142 (ref 137–147)

## 2020-07-17 LAB — COMPREHENSIVE METABOLIC PANEL WITH GFR
Albumin: 4.1 (ref 3.5–5.0)
Calcium: 10.4 (ref 8.7–10.7)
GFR calc Af Amer: 50
GFR calc non Af Amer: 41

## 2020-07-17 LAB — HEMOGLOBIN A1C: Hemoglobin A1C: 6.5

## 2020-07-17 LAB — TSH: TSH: 2.42 (ref 0.41–5.90)

## 2020-08-11 LAB — CBC AND DIFFERENTIAL
HCT: 45 (ref 36–46)
Hemoglobin: 14.7 (ref 12.0–16.0)
Neutrophils Absolute: 7.4
Platelets: 254 (ref 150–399)
WBC: 10.2

## 2020-08-11 LAB — CBC: RBC: 5.47 — AB (ref 3.87–5.11)

## 2020-08-13 ENCOUNTER — Emergency Department (HOSPITAL_COMMUNITY)
Admission: EM | Admit: 2020-08-13 | Discharge: 2020-08-13 | Disposition: A | Discharge status: Home / Self Care | Payer: Medicare Other | Attending: Emergency Medicine | Admitting: Emergency Medicine

## 2020-08-13 ENCOUNTER — Telehealth: Payer: Self-pay | Admitting: Medical

## 2020-08-13 ENCOUNTER — Encounter (HOSPITAL_COMMUNITY): Payer: Self-pay | Admitting: Emergency Medicine

## 2020-08-13 ENCOUNTER — Emergency Department (HOSPITAL_BASED_OUTPATIENT_CLINIC_OR_DEPARTMENT_OTHER)
Admit: 2020-08-13 | Discharge: 2020-08-13 | Disposition: A | Discharge status: Home / Self Care | Payer: Medicare Other | Attending: Emergency Medicine | Admitting: Emergency Medicine

## 2020-08-13 ENCOUNTER — Other Ambulatory Visit: Payer: Self-pay

## 2020-08-13 DIAGNOSIS — Z87891 Personal history of nicotine dependence: Secondary | ICD-10-CM | POA: Diagnosis not present

## 2020-08-13 DIAGNOSIS — M79605 Pain in left leg: Secondary | ICD-10-CM | POA: Diagnosis not present

## 2020-08-13 DIAGNOSIS — R609 Edema, unspecified: Secondary | ICD-10-CM

## 2020-08-13 DIAGNOSIS — J45909 Unspecified asthma, uncomplicated: Secondary | ICD-10-CM | POA: Insufficient documentation

## 2020-08-13 DIAGNOSIS — Z79899 Other long term (current) drug therapy: Secondary | ICD-10-CM | POA: Insufficient documentation

## 2020-08-13 DIAGNOSIS — Z7901 Long term (current) use of anticoagulants: Secondary | ICD-10-CM | POA: Diagnosis not present

## 2020-08-13 DIAGNOSIS — N183 Chronic kidney disease, stage 3 unspecified: Secondary | ICD-10-CM | POA: Insufficient documentation

## 2020-08-13 DIAGNOSIS — I2510 Atherosclerotic heart disease of native coronary artery without angina pectoris: Secondary | ICD-10-CM | POA: Insufficient documentation

## 2020-08-13 DIAGNOSIS — M79604 Pain in right leg: Secondary | ICD-10-CM | POA: Diagnosis present

## 2020-08-13 DIAGNOSIS — I129 Hypertensive chronic kidney disease with stage 1 through stage 4 chronic kidney disease, or unspecified chronic kidney disease: Secondary | ICD-10-CM | POA: Diagnosis not present

## 2020-08-13 NOTE — Discharge Instructions (Addendum)
Follow up with your doctor next week °

## 2020-08-13 NOTE — Progress Notes (Signed)
Bilateral lower extremity venous duplex has been completed. Preliminary results can be found in CV Proc through chart review.  Results were given to Dr. Freida Busman.  08/13/20 3:50 PM Olen Cordial RVT

## 2020-08-13 NOTE — Telephone Encounter (Signed)
Patient called to report she woke up last night at around 10 PM and noted R upper leg pain. Constant throb or pain. She went back to sleep and woke up later and still had leg pain. Pain is worse with standing. Feels leg is warm. She has had multiple blood clots in the past and is on Xaerlto. This feels similar. She has chronic sob and chest pain that is unchanged. I recommended the patient go to the ED for evaluation. Will let Dr. Royann Shivers know.  Ula Couvillon Fransico Michael, PA-C

## 2020-08-13 NOTE — ED Provider Notes (Signed)
[facility]-EMERGENCY DEPT Provider Note   CSN: [id number] Arrival date & time: 08/13/20  1044     History Chief Complaint  Patient presents with  . Leg Pain    Mary Ferguson is a 69 y.o. female.  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  Denies any new shortness of breath or pleuritic chest pain.  Pain has been going on x1 day and starts in her groin and goes down to her thighs.  States this is similar to her DVTs in the past.  Denies any history of trauma        Past Medical History:  Diagnosis Date  . Asthma   . Chronic generalized abdominal pain   . Chronic pain   . CKD (chronic kidney disease), stage III (HCC)   . Hypertension   . Morbid obesity (HCC)   . Recurrent deep vein thrombosis (DVT) [facility])     Patient Active Problem List   Diagnosis Date Noted  . Sleep apnea 12/30/2019  . Chest pain with moderate risk for cardiac etiology 11/02/2019  . CAD S/P PCI 06/30/2019  . Dyslipidemia, goal LDL below 70 06/30/2019  . Status post coronary artery stent placement   . Morbid obesity (HCC)   . Essential hypertension   . CKD (chronic kidney disease), stage III (HCC)   . Chronic pain   . Chronic generalized abdominal pain   . History of non-ST elevation myocardial infarction (NSTEMI) 06/17/2019  . History of DVT of lower extremity 09/29/2018    Past Surgical History:  Procedure Laterality Date  . CORONARY STENT INTERVENTION N/A 06/18/2019   Procedure: CORONARY STENT INTERVENTION;  Surgeon: Iran Ouch, MD;  Location: [facility] INVASIVE CV LAB;  Service: Cardiovascular;  Laterality: N/A;  . EYE SURGERY    . HERNIA REPAIR    . LEFT HEART CATH AND CORONARY ANGIOGRAPHY N/A 06/18/2019   Procedure: LEFT HEART CATH AND CORONARY ANGIOGRAPHY;  Surgeon: Iran Ouch, MD;  Location: [facility] INVASIVE CV LAB;  Service: Cardiovascular;  Laterality:  N/A;  . LEFT HEART CATH AND CORONARY ANGIOGRAPHY N/A 11/30/2019   Procedure: LEFT HEART CATH AND CORONARY ANGIOGRAPHY;  Surgeon: Lennette Bihari, MD;  Location: [facility] INVASIVE CV LAB;  Service: Cardiovascular;  Laterality: N/A;  . VEIN SURGERY       OB History   No obstetric history on file.     No family history on file.  Social History   Tobacco Use  . Smoking status: Former Games developer  . Smokeless tobacco: Never Used  Vaping Use  . Vaping Use: Never used  Substance Use Topics  . Alcohol use: Never  . Drug use: Never    Home Medications Prior to Admission medications   Medication Sig Start Date End Date Taking? Authorizing Provider  isosorbide mononitrate (IMDUR) 60 MG 24 hr tablet TAKE 1 TABLET(60 MG) BY MOUTH DAILY 06/19/20   Croitoru, Rachelle Hora, MD  acetaminophen (TYLENOL) 500 MG tablet Take 500 mg by mouth every 6 (six) hours as needed for mild pain or moderate pain.    [provider]  atorvastatin (LIPITOR) 80 MG tablet Take 1 tablet (80 mg total) by mouth every evening. 12/15/19   Croitoru, Mihai, MD  carvedilol (COREG) 12.5 MG tablet Take 1 tablet (12.5 mg total) by mouth 2 (two) times daily. 04/19/20   Croitoru, Mihai, MD  clobetasol ointment (TEMOVATE) 0.05 % Apply 1 application topically  daily as needed (Knots on face).  09/22/18   [provider]  dicyclomine (BENTYL) 20 MG tablet Take 20 mg by mouth 4 (four) times daily as needed for spasms.     [provider]  diphenoxylate-atropine (LOMOTIL) 2.5-0.025 MG tablet Take 1 tablet by mouth 4 (four) times daily as needed for diarrhea or loose stools.    [provider]  hydrOXYzine (ATARAX/VISTARIL) 25 MG tablet Take 25 mg by mouth every 6 (six) hours as needed for itching.  09/22/18   [provider]  metroNIDAZOLE (METROCREAM) 0.75 % cream Apply 1 application topically daily as needed (Itching).  11/19/17   [provider]  nitroGLYCERIN (NITROSTAT) 0.4 MG SL tablet Place 1 tablet  (0.4 mg total) under the tongue every 5 (five) minutes x 3 doses as needed for chest pain. 12/15/19   Croitoru, Mihai, MD  promethazine (PHENERGAN) 25 MG tablet Take 25 mg by mouth daily as needed for nausea.  03/10/15   [provider]  psyllium (METAMUCIL SMOOTH TEXTURE) 28 % packet Take 1 packet by mouth 3 (three) times daily as needed (constipation). unflavored sugar-free    [provider]  rivaroxaban (XARELTO) 20 MG TABS tablet Take 1 tablet (20 mg total) by mouth every evening. 12/15/19   Croitoru, Mihai, MD    Allergies    Rofecoxib, Tetracyclines & related, Benadryl allergy [diphenhydramine hcl], Cephalexin, and Gatifloxacin  Review of Systems   Review of Systems  All other systems reviewed and are negative.   Physical Exam Updated Vital Signs BP (!) 141/97   Pulse 73   Temp 98.3 F (36.8 C) (Oral)   Resp 17   SpO2 98%   Physical Exam Vitals and nursing note reviewed.  Constitutional:      General: She is not in acute distress.    Appearance: Normal appearance. She is well-developed and well-nourished. She is not toxic-appearing.  HENT:     Head: Normocephalic and atraumatic.  Eyes:     General: Lids are normal.     Extraocular Movements: EOM normal.     Conjunctiva/sclera: Conjunctivae normal.     Pupils: Pupils are equal, round, and reactive to light.  Neck:     Thyroid: No thyroid mass.     Trachea: No tracheal deviation.  Cardiovascular:     Rate and Rhythm: Normal rate and regular rhythm.     Heart sounds: Normal heart sounds. No murmur heard. No gallop.   Pulmonary:     Effort: Pulmonary effort is normal. No respiratory distress.     Breath sounds: Normal breath sounds. No stridor. No decreased breath sounds, wheezing, rhonchi or rales.  Abdominal:     General: Bowel sounds are normal. There is no distension.     Palpations: Abdomen is soft.     Tenderness: There is no abdominal tenderness. There is no CVA tenderness or rebound.   Musculoskeletal:        General: No tenderness or edema. Normal range of motion.     Cervical back: Normal range of motion and neck supple.       Legs:  Skin:    General: Skin is warm and dry.     Findings: No abrasion or rash.  Neurological:     Mental Status: She is alert and oriented to person, place, and time.     GCS: GCS eye subscore is 4. GCS verbal subscore is 5. GCS motor subscore is 6.     Cranial Nerves: No cranial nerve  deficit.     Sensory: No sensory deficit.     Deep Tendon Reflexes: Strength normal.  Psychiatric:        Mood and Affect: Mood and affect normal.        Speech: Speech normal.        Behavior: Behavior normal.     ED Results / Procedures / Treatments   Labs (all labs ordered are listed, but only abnormal results are displayed) Labs Reviewed - No data to display  EKG None  Radiology No results found.  Procedures Procedures (including critical care time)  Medications Ordered in ED Medications - No data to display  ED Course  I have reviewed the triage vital signs and the nursing notes.  Pertinent labs & imaging results that were available during my care of the patient were reviewed by me and considered in my medical decision making (see chart for details).    MDM Rules/Calculators/A&P                         Dopplers of bilateral lower extremities are negative.  No evidence of vascular compromise. will discharge home Final Clinical Impression(s) / ED Diagnoses Final diagnoses:  None    Rx / DC Orders ED Discharge Orders    None       Lorre Nick, MD 08/13/20 1552

## 2020-08-13 NOTE — ED Triage Notes (Signed)
Patient c/o bilateral leg pain since last night. Denies injury. Hx DVT. Takes xarelto as prescribed.

## 2020-08-13 NOTE — ED Notes (Signed)
Bilateral LE pain, denies injury

## 2020-08-14 ENCOUNTER — Other Ambulatory Visit: Payer: Self-pay | Admitting: Geriatric Medicine

## 2020-08-14 DIAGNOSIS — M545 Low back pain, unspecified: Secondary | ICD-10-CM

## 2020-08-15 NOTE — Telephone Encounter (Signed)
Dopplers were negative in ED.

## 2020-12-13 ENCOUNTER — Other Ambulatory Visit: Payer: Self-pay | Admitting: Cardiovascular Disease

## 2020-12-13 NOTE — Telephone Encounter (Signed)
*  STAT* If patient is at the pharmacy, call can be transferred to refill team.   1. Which medications need to be refilled? (please list name of each medication and dose if known) atorvastatin (LIPITOR) 80 MG tablet carvedilol (COREG) 12.5 MG tablet isosorbide mononitrate (IMDUR) 60 MG 24 hr tablet rivaroxaban (XARELTO) 20 MG TABS tablet   2. Which pharmacy/location (including street and city if local pharmacy) is medication to be sent to? [id number] - [place], [place] - [address]  3. Do they need a 30 day or 90 day supply? 90 day supply  Pt only have enough meds to last til friday

## 2020-12-14 ENCOUNTER — Other Ambulatory Visit: Payer: Self-pay

## 2020-12-14 ENCOUNTER — Telehealth: Payer: Self-pay | Admitting: Cardiovascular Disease

## 2020-12-14 MED ORDER — ISOSORBIDE MONONITRATE ER 60 MG PO TB24: ORAL_TABLET | ORAL | 3 refills | Status: DC

## 2020-12-14 MED ORDER — RIVAROXABAN 20 MG PO TABS: 20.0000 mg | ORAL_TABLET | Freq: Every evening | ORAL | 1 refills | Status: DC

## 2020-12-14 MED ORDER — ATORVASTATIN CALCIUM 80 MG PO TABS
80.0000 mg | ORAL_TABLET | Freq: Every evening | ORAL | 3 refills | Status: DC
Start: 2020-12-14 — End: 2021-10-09

## 2020-12-14 MED ORDER — CARVEDILOL 12.5 MG PO TABS: 12.5000 mg | ORAL_TABLET | Freq: Two times a day (BID) | ORAL | 11 refills | Status: DC

## 2020-12-14 NOTE — Telephone Encounter (Signed)
This has already been addressed in a refill encounter

## 2020-12-14 NOTE — Telephone Encounter (Signed)
*  STAT* If patient is at the pharmacy, call can be transferred to refill team.   1. Which medications need to be refilled? (please list name of each medication and dose if known) rivaroxaban (XARELTO) 20 MG TABS tablet  2. Which pharmacy/location (including street and city if local pharmacy) is medication to be sent to? [id number] - [place], [place] - [address]  3. Do they need a 30 day or 90 day supply? 90 day   Patient is out of medication. She states the pharmacy has not received the refill and need it sent again.

## 2020-12-14 NOTE — Telephone Encounter (Signed)
1f, 115.5kg, Creatinine, Serum 1.290 mg/ 07/17/2020, lovw/croitoru 04/19/20, ccr 75

## 2020-12-18 IMAGING — US US EXTREM LOW VENOUS
1 series · 12 of 24 positions shown · non-contrast
Comparison: Report of [REDACTED] lower extremity
Doppler 05/23/2009 (no images available).

Addendum:
CLINICAL DATA: 67-year-old female with leg pain. Right thigh pain
swelling and redness for 2-3 days. History of bilateral DVT 1 year
ago. Off blood thinner since [REDACTED]



[Series 1: us extrem low venous · 12 of 50 slices shown]
[im 3/50]
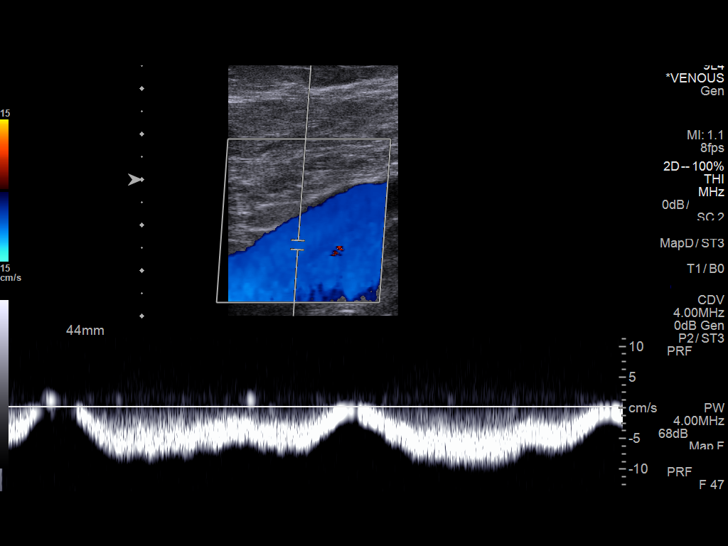
[im 7/50]
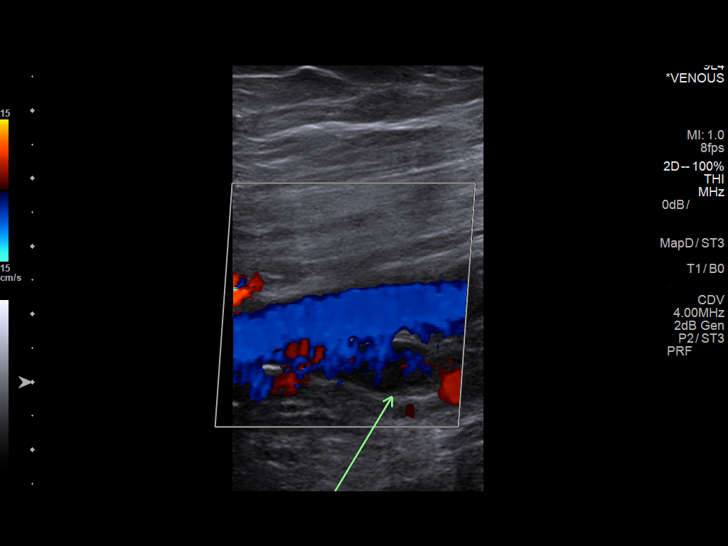
[im 11/50]
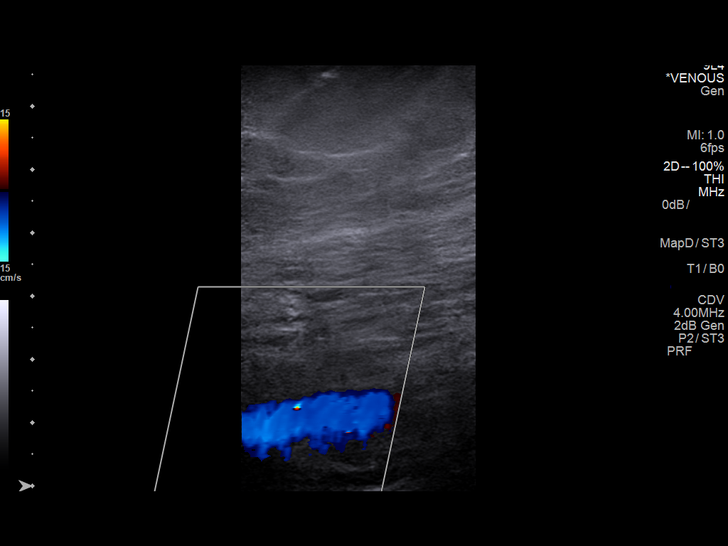
[im 15/50]
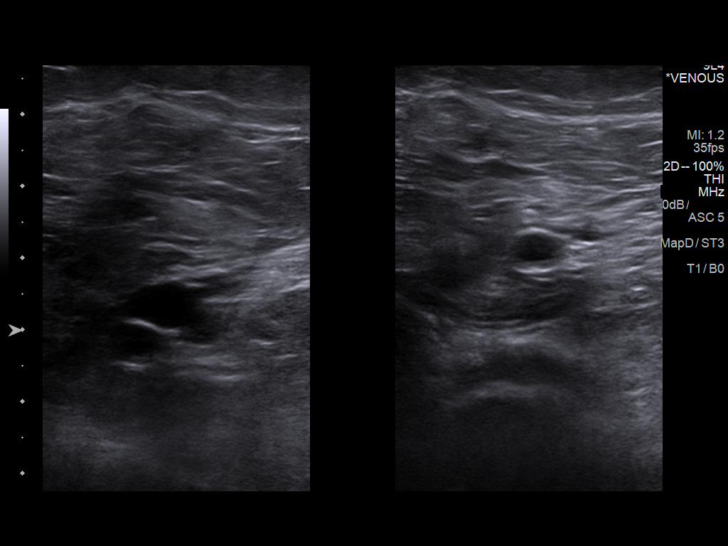
[im 20/50]
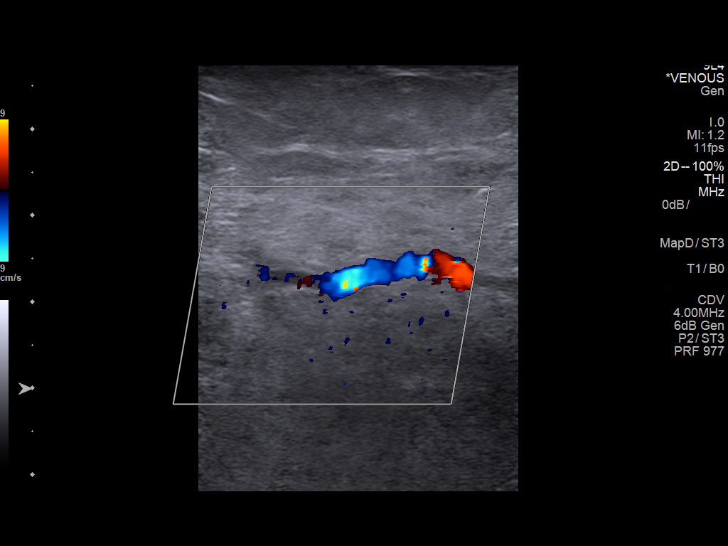
[im 24/50]
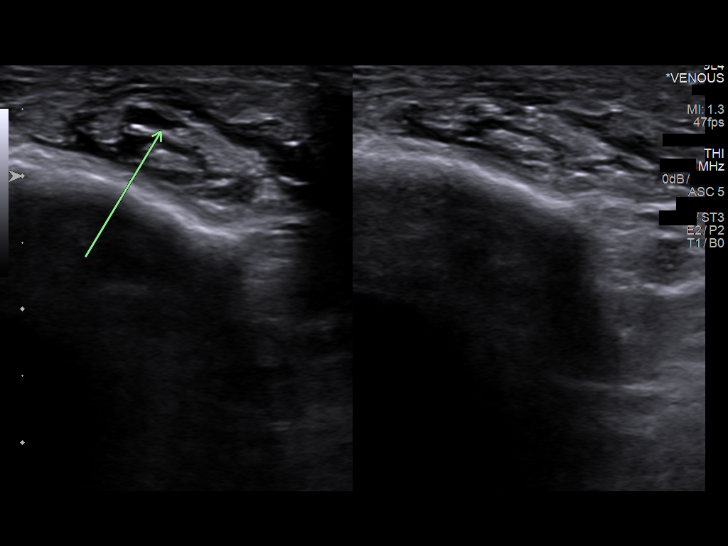
[im 28/50]
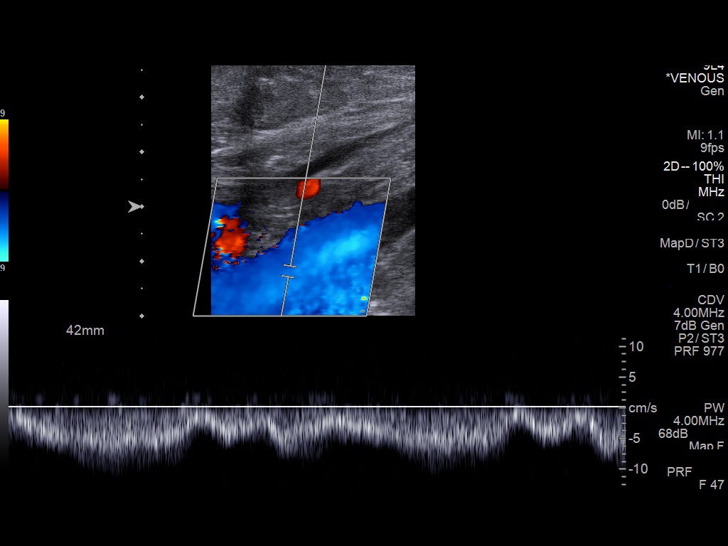
[im 32/50]
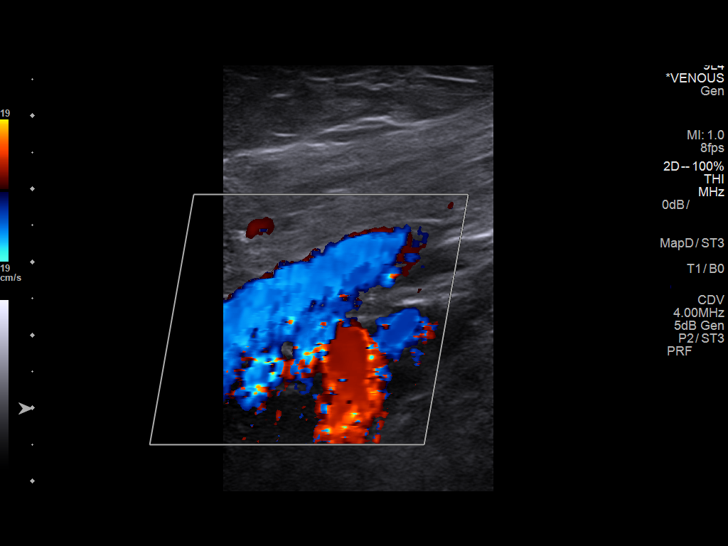
[im 37/50]
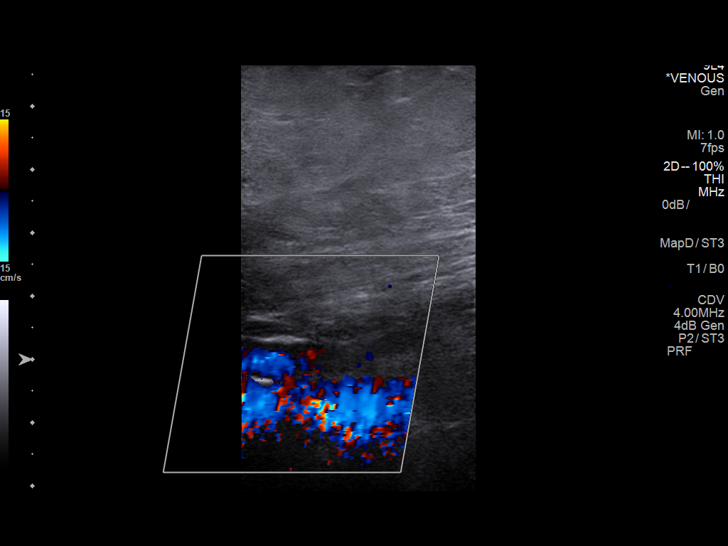
[im 41/50]
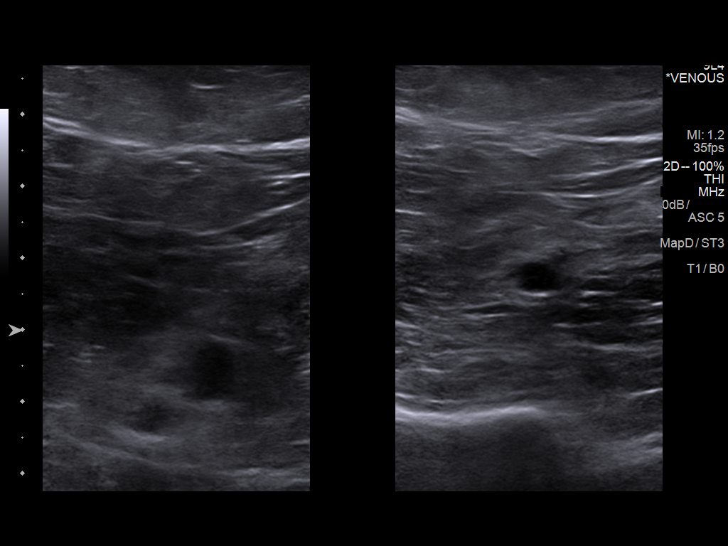
[im 45/50]
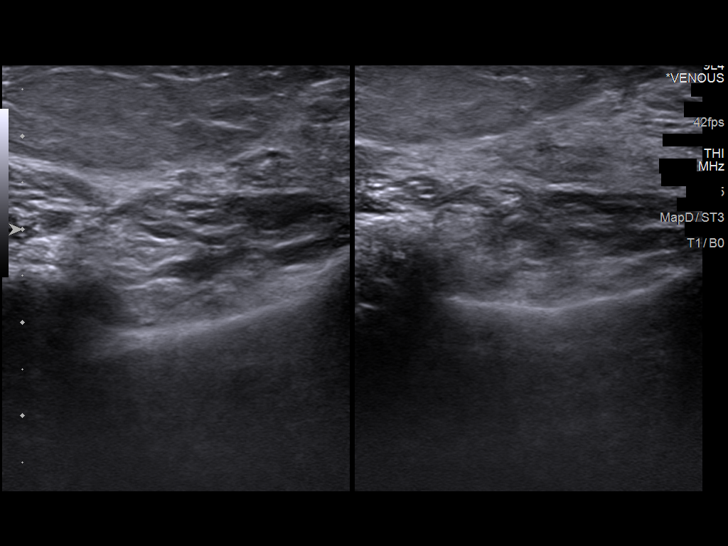
[im 50/50]
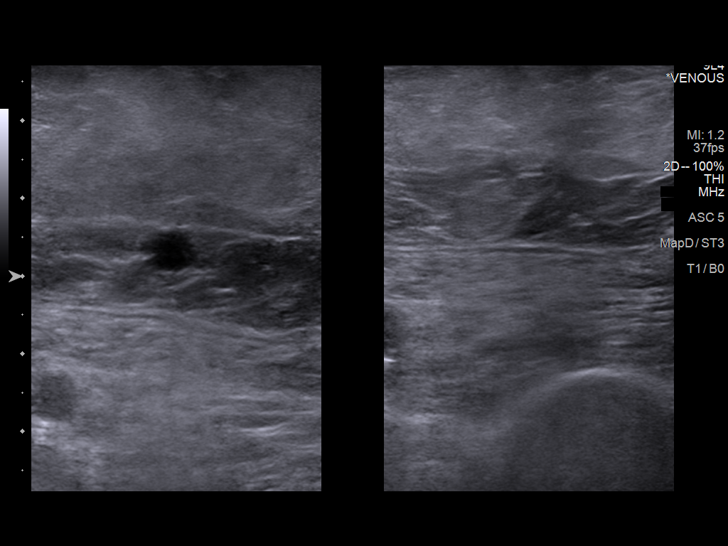

[12 of 24 positions shown; findings below may reference images not displayed]

FINDINGS: RIGHT LOWER EXTREMITY

Common Femoral Vein: No evidence of thrombus. Normal
compressibility, respiratory phasicity and response to augmentation.

Saphenofemoral Junction: Just beyond the junction the right greater
saphenous vein is incompressible with echogenic clot (image 5).

Profunda Femoral Vein: Incompletely compressible with echogenic clot
(image 8).

Femoral Vein: No evidence of thrombus. Normal compressibility,
respiratory phasicity and response to augmentation.

Popliteal Vein: No evidence of thrombus. Normal compressibility,
respiratory phasicity and response to augmentation.

Calf Veins: Suboptimally visualized. No evidence of thrombus. Normal
compressibility and flow on color Doppler imaging.

Superficial Great Saphenous Vein: The distal right greater saphenous
vein appears compressible and patent up to the level of the upper
thigh.

Venous Reflux:  None.

Other Findings:  None.

LEFT LOWER EXTREMITY

Common Femoral Vein: No evidence of thrombus. Normal
compressibility, respiratory phasicity and response to augmentation.

Saphenofemoral Junction: No evidence of thrombus. Normal
compressibility and flow on color Doppler imaging.

Profunda Femoral Vein: No evidence of thrombus. Normal
compressibility and flow on color Doppler imaging.

Femoral Vein: No evidence of thrombus. Normal compressibility,
respiratory phasicity and response to augmentation.

Popliteal Vein: No evidence of thrombus. Normal compressibility,
respiratory phasicity and response to augmentation.

Calf Veins: Suboptimally visualized. No evidence of thrombus. Normal
compressibility and flow on color Doppler imaging.

Superficial Great Saphenous Vein: No evidence of thrombus. Normal
compressibility.

Venous Reflux:  None.

Other Findings:  None.
IMPRESSION: 1. Positive for right lower extremity DVT in the profundal femoral
vein and proximal greater saphenous vein.
2. Left lower extremity is negative for DVT.

ADDENDUM:
Study discussed by telephone with Dr. Renaud Bench on 09/29/2018 at
1366 hours.

*** End of Addendum ***

## 2021-01-17 ENCOUNTER — Encounter: Payer: Self-pay | Admitting: Cardiovascular Disease

## 2021-01-17 ENCOUNTER — Other Ambulatory Visit: Payer: Self-pay

## 2021-01-17 ENCOUNTER — Ambulatory Visit (INDEPENDENT_AMBULATORY_CARE_PROVIDER_SITE_OTHER): Payer: Medicare Other | Admitting: Cardiovascular Disease

## 2021-01-17 VITALS — BP 142/91 | HR 55 | Ht 63.0 in | Wt 256.6 lb

## 2021-01-17 DIAGNOSIS — I10 Essential (primary) hypertension: Secondary | ICD-10-CM

## 2021-01-17 DIAGNOSIS — Z86718 Personal history of other venous thrombosis and embolism: Secondary | ICD-10-CM | POA: Diagnosis not present

## 2021-01-17 DIAGNOSIS — E782 Mixed hyperlipidemia: Secondary | ICD-10-CM

## 2021-01-17 DIAGNOSIS — E669 Obesity, unspecified: Secondary | ICD-10-CM

## 2021-01-17 DIAGNOSIS — E1169 Type 2 diabetes mellitus with other specified complication: Secondary | ICD-10-CM

## 2021-01-17 DIAGNOSIS — I25118 Atherosclerotic heart disease of native coronary artery with other forms of angina pectoris: Secondary | ICD-10-CM

## 2021-01-17 LAB — BASIC METABOLIC PANEL
BUN/Creatinine Ratio: 23 (ref 12–28)
BUN: 22 mg/dL (ref 8–27)
CO2: 20 mmol/L (ref 20–29)
Calcium: 9.6 mg/dL (ref 8.7–10.3)
Chloride: 104 mmol/L (ref 96–106)
Creatinine, Ser: 0.95 mg/dL (ref 0.57–1.00)
Glucose: 117 mg/dL — ABNORMAL HIGH (ref 65–99)
Potassium: 4.8 mmol/L (ref 3.5–5.2)
Sodium: 141 mmol/L (ref 134–144)
eGFR: 65 mL/min/{1.73_m2} (ref >59)

## 2021-01-17 LAB — LIPID PANEL
Chol/HDL Ratio: 2.8 ratio (ref 0.0–4.4)
Cholesterol, Total: 125 mg/dL (ref 100–199)
HDL: 44 mg/dL (ref >39)
LDL Chol Calc (NIH): 61 mg/dL (ref 0–99)
Triglycerides: 107 mg/dL (ref 0–149)
VLDL Cholesterol Cal: 20 mg/dL (ref 5–40)

## 2021-01-17 MED ORDER — CARVEDILOL 6.25 MG PO TABS: 6.2500 mg | ORAL_TABLET | Freq: Two times a day (BID) | ORAL | 3 refills | Status: DC

## 2021-01-17 MED ORDER — AMLODIPINE BESYLATE 2.5 MG PO TABS: 2.5000 mg | ORAL_TABLET | Freq: Every day | ORAL | 3 refills | Status: DC

## 2021-01-17 NOTE — Progress Notes (Signed)
Cardiology Office Note   Date:  01/17/2021   ID:  Mary Ferguson, DOB [birth year], MRN [id number]  PCP:  Merlene Laughter, MD  Cardiologist:  Thurmon Fair, MD  Electrophysiologist:  None   Evaluation Performed:  Follow-Up Visit  CAD Chief Complaint: Chest pain  History of Present Illness:    Mary Ferguson is a 70 y.o. female with morbid obesity, DM, HTN, mixed hyperlipidemia, history of recurrent DVT, CKD 3a and chronic pain, diagnosed with coronary artery disease when she presented with inferior wall ST segment elevation myocardial infarction in October 2020 and received drug-eluting stents to the mid and distal right coronary artery (Dr. Kirke Corin, resolute Onyx 3.5x26 and 3.0x18).  At that time she was also noted to have a 70% mid LAD stenosis that appeared to be chronic.  She had transient LV dysfunction with an EF of about 40% at the time of LV angiography, 60-65% on follow-up echocardiogram the next day.  At the time of cath LV EDP was mildly elevated at 22 mmHg. Repeat angiography in April 2021 showed no change.  She mostly complains of problems with dizziness.  This occurs when she first gets up in the morning, but also if she moves her head suddenly like looking through the side window of her car.  It seems that she is describing true vertigo.  It can happen when she is sitting or standing.  She has had a few episodes of chest discomfort that occur randomly while at rest, never with exertion.  They do respond to nitroglycerin promptly.  She is never had to take more than 1 pill.  Her current antianginals are carvedilol 12.5 mg twice daily and isosorbide mononitrate 60 mg once daily.  She is very sedentary and has problems with severe lower back pain.  She denies any issues with dyspnea at her level of exertion and has not had orthopnea, PND, palpitations, syncope, lower extremity edema, cough, hemoptysis, wheezing or focal neurological events.  She does complain of fatigue and points out that  her heart rate is always low, usually in the 50s but sometimes in the 40s.  She has not had any falls, bleeding or serious injuries on rivaroxaban.  She take this medication for DVT prophylaxis, rather than atrial fibrillation.  She is scared to decrease the rivaroxaban dose to 10 mg.  When this was done in the past she had her heart attack.  She prefers to continue taking the 20 mg dose.  Her lipid profile has not been repeated since April 2021 but at that time her LDL was excellent at 63.  Glycemic control is good with an A1c of 6.5% last November.  Her most recent creatinine was 1.29 last November, but recheck today and it was normal at 0.95.  She bears a diagnosis of CKD stage III.  Her creatinine has been in the 1.0-1.2 range consistently.  She has a history of recurrent DVT and is on chronic treatment with Xarelto.  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  She was on chronic opiate therapy for a while, but her quality of life deteriorated substantially so she stopped taking them and is very afraid of becoming addicted to them in the future.   Past Medical History:  Diagnosis Date  . Asthma   . Chronic generalized abdominal pain   . Chronic pain   . CKD (chronic kidney  disease), stage III (HCC)   . Hypertension   . Morbid obesity (HCC)   . Recurrent deep vein thrombosis (DVT) (HCC)    Past Surgical History:  Procedure Laterality Date  . CORONARY STENT INTERVENTION N/A 06/18/2019   Procedure: CORONARY STENT INTERVENTION;  Surgeon: Iran Ouch, MD;  Location: [facility];  Service: Cardiovascular;  Laterality: N/A;  . EYE SURGERY    . HERNIA REPAIR    . LEFT HEART CATH AND CORONARY ANGIOGRAPHY N/A 06/18/2019   Procedure: LEFT HEART CATH AND CORONARY ANGIOGRAPHY;  Surgeon: Iran Ouch, MD;  Location: [facility];  Service: Cardiovascular;  Laterality: N/A;  . LEFT HEART CATH AND CORONARY ANGIOGRAPHY N/A 11/30/2019   Procedure: LEFT HEART CATH AND CORONARY ANGIOGRAPHY;  Surgeon: Lennette Bihari, MD;  Location: [facility];  Service: Cardiovascular;  Laterality: N/A;  . VEIN SURGERY       Current Meds  Medication Sig  . acetaminophen (TYLENOL) 500 MG tablet Take 500 mg by mouth every 6 (six) hours as needed for mild pain or moderate pain.  Marland Kitchen amLODipine (NORVASC) 2.5 MG tablet Take 1 tablet (2.5 mg total) by mouth daily.  Marland Kitchen atorvastatin (LIPITOR) 80 MG tablet Take 1 tablet (80 mg total) by mouth every evening.  . dicyclomine (BENTYL) 20 MG tablet Take 20 mg by mouth 4 (four) times daily as needed for spasms.   . famotidine (PEPCID) 40 MG tablet 1 tablet at bedtime.  . fluticasone (VERAMYST) 27.5 MCG/SPRAY nasal spray Place into both nostrils as needed.  . isosorbide mononitrate (IMDUR) 60 MG 24 hr tablet TAKE 1 TABLET(60 MG) BY MOUTH DAILY  . nitroGLYCERIN (NITROSTAT) 0.4 MG SL tablet Place 1 tablet (0.4 mg total) under the tongue every 5 (five) minutes x 3 doses as needed for chest pain.  . rivaroxaban (XARELTO) 20 MG TABS tablet Take 1 tablet (20 mg total) by mouth every evening.  . [DISCONTINUED] carvedilol (COREG) 12.5 MG tablet Take 1 tablet (12.5 mg total) by mouth 2 (two) times daily.     Allergies:   Rofecoxib, Tetracyclines & related, Benadryl allergy [diphenhydramine hcl], Cephalexin, and Gatifloxacin   Social History   Tobacco Use  . Smoking status: Former Games developer  . Smokeless tobacco: Never Used  Vaping Use  . Vaping Use: Never used  Substance Use Topics  . Alcohol use: Never  . Drug use: Never     Family Hx: The patient's family history is significant for the absence of premature onset CAD or PAD. ROS:   Please see the history of present illness.    All other systems are reviewed and are negative.   Prior CV studies:   The following studies were reviewed  today:  Cardiac catheterization 06/18/2019  Diagnostic Dominance: Right  Intervention   Implants   Permanent Stent  Stent Resolute Onyx 3.5x26 - [id number] - Implanted  Inventory item: Dreama Saa 3.9J67 Model/Cat number: [id number]  Manufacturer: [employer] Lot number: [id number]  Device identifier: [id number] Device identifier type: GS1  Area Of Implantation: Mid RCA        Cardiac cath 11/30/2019   Previously placed Prox RCA to Mid RCA stent (unknown type) is widely patent.  Previously placed Dist RCA stent (unknown type) is widely patent.  Prox Cx to Mid Cx lesion is 30% stenosed.  Prox LAD lesion is 30% stenosed.  Mid LAD-1 lesion is 40% stenosed.  Mid LAD-2 lesion is 70% stenosed.  Dist LAD lesion is  40% stenosed.   Widely patent mid and distal RCA stents in the dominant RCA vessel.  Mild to moderate concomitant CAD with 30 and 40% proximal LAD stenoses, no change in the previously noted focal 70% mid LAD stenosis with 40% narrowing beyond this segment; normal small ramus intermediate vessel and tortuous circumflex vessel with 30% proximal narrowing.  LVEDP 8 mmHg  Diagnostic Dominance: Right      Labs/Other Tests and Data Reviewed:   CARDIAC CATH 11/30/2019: Diagnostic Dominance: Right    EKG:  Ordered today shows borderline sinus tachycardia, left axis deviation with pulmonary disease pattern, no ischemic repolarization changes, QTC 450 ms.  Recent Labs: 01/17/2021: BUN 22; Creatinine, Ser 0.95; Potassium 4.8; Sodium 141   Recent Lipid Panel Lab Results  Component Value Date/Time   CHOL 125 01/17/2021 09:35 AM   TRIG 107 01/17/2021 09:35 AM   HDL 44 01/17/2021 09:35 AM   CHOLHDL 2.8 01/17/2021 09:35 AM   CHOLHDL 5.2 06/18/2019 03:48 AM   LDLCALC 61 01/17/2021 09:35 AM    Wt Readings from Last 3 Encounters:  01/17/21 256 lb 9.6 oz (116.4 kg)  04/19/20 254 lb 9.6 oz (115.5 kg)  01/21/20 255 lb (115.7  kg)     Objective:    Vital Signs:  BP (!) 142/91   Pulse (!) 55   Ht 5\' 3"  (1.6 m)   Wt 256 lb 9.6 oz (116.4 kg)   SpO2 97%   BMI 45.45 kg/m   When first checked in her blood pressure was 162/102, but when I rechecked her blood pressure it was greatly improved at 142/91   General: Alert, oriented x3, no distress, morbidly obese Head: no evidence of trauma, PERRL, EOMI, no exophtalmos or lid lag, no myxedema, no xanthelasma; normal ears, nose and oropharynx Neck: normal jugular venous pulsations and no hepatojugular reflux; brisk carotid pulses without delay and no carotid bruits Chest: clear to auscultation, no signs of consolidation by percussion or palpation, normal fremitus, symmetrical and full respiratory excursions Cardiovascular: normal position and quality of the apical impulse, regular rhythm, normal first and second heart sounds, no murmurs, rubs or gallops Abdomen: no tenderness or distention, no masses by palpation, no abnormal pulsatility or arterial bruits, normal bowel sounds, no hepatosplenomegaly Extremities: no clubbing, cyanosis or edema; 2+ radial, ulnar and brachial pulses bilaterally; 2+ right femoral, posterior tibial and dorsalis pedis pulses; 2+ left femoral, posterior tibial and dorsalis pedis pulses; no subclavian or femoral bruits Neurological: grossly nonfocal Psych: Normal mood and affect  ASSESSMENT & PLAN:    1. Coronary artery disease involving native coronary artery of native heart with other form of angina pectoris (HCC)   2. Mixed hyperlipidemia   3. Diabetes mellitus type 2 in obese (HCC)   4. History of DVT of lower extremity   5. Essential hypertension   6. Morbid obesity (HCC)      1. CAD: Her chest discomfort is atypical but responds to nitroglycerin.  Is conceivable she has coronary vasospasm.  We will decrease her carvedilol since she is quite bradycardic and add amlodipine instead.  May need to further titrate up the dose of amlodipine  for her blood pressure.  Repeat cardiac catheterization in April 2021 did not show any evidence of in-stent restenosis or progression of disease.  Not on aspirin since she is fully anticoagulated with Xarelto. 2. HLP: We checked her lipids today, her LDL was excellent at 61.  Target LDL less than 70.  Continue statin and the current dose.  3. DM: Glycemic control is very good with a hemoglobin A1c of 6.5%. 4. Hx of Recurrent DVT: We will continue lifelong Xarelto.  I recommended decreasing the dose to 10 mg daily, but she feels more confident that she is protected from stroke and heart attack on the 20 mg daily dose.  As long as her renal function is normal and she does not have serious complications with bleeding, this is not unreasonable. 5. HTN: We increased her carvedilol dose to 12.5 mg twice daily at her last appointment and she has had problems of bradycardia.  We will decrease the dose back to 6.25 mg twice daily and add amlodipine which would have benefit as a treatment for potential coronary vasospasm. 6. Morbid obesity: Unable to exercise due to a variety of orthopedic/spine complaints.  Encouraged her to try to limit her weight through caloric restriction.   COVID-19 Education: The signs and symptoms of COVID-19 were discussed with the patient and how to seek care for testing (follow up with PCP or arrange E-visit).  The importance of social distancing was discussed today.  Patient Instructions  Medication Instructions:  DECREASE the Carvedilol to 6.125 mg twice daily START Amlodipine 2.5 mg once daily  Please take a one month Statin Holiday. Call back in a month to let us know how you are feeling. [phone]  *If you need a refill on your cardiac medications before your next appointment, please call your pharmacy*   Lab Work: Your provider would like for you to have the following labs today: Lipid and Bmet  If you have labs (blood work) drawn today and your tests are completely  normal, you will receive your results only by: Marland Kitchen MyChart Message (if you have MyChart) OR . A paper copy in the mail If you have any lab test that is abnormal or we need to change your treatment, we will call you to review the results.   Testing/Procedures: None ordered   Follow-Up: At [facility], you and your health needs are our priority.  As part of our continuing mission to provide you with exceptional heart care, we have created designated Provider Care Teams.  These Care Teams include your primary Cardiologist (physician) and Advanced Practice Providers (APPs -  Physician Assistants and Nurse Practitioners) who all work together to provide you with the care you need, when you need it.  We recommend signing up for the patient portal called "MyChart".  Sign up information is provided on this After Visit Summary.  MyChart is used to connect with patients for Virtual Visits (Telemedicine).  Patients are able to view lab/test results, encounter notes, upcoming appointments, etc.  Non-urgent messages can be sent to your provider as well.   To learn more about what you can do with MyChart, go to [email or web address].    Your next appointment:   6 week(s)  The format for your next appointment:   In Person  Provider:   You will see one of the following Advanced Practice Providers on your designated Care Team:    Azalee Course, PA-C  Micah Flesher, PA-C or   Judy Pimple, PA-C        Signed, Thurmon Fair, MD  01/17/2021 5:47 PM    [facility]

## 2021-01-17 NOTE — Patient Instructions (Addendum)
Medication Instructions:  DECREASE the Carvedilol to 6.125 mg twice daily START Amlodipine 2.5 mg once daily  Please take a one month Statin Holiday. Call back in a month to let us know how you are feeling. [phone]  *If you need a refill on your cardiac medications before your next appointment, please call your pharmacy*   Lab Work: Your provider would like for you to have the following labs today: Lipid and Bmet  If you have labs (blood work) drawn today and your tests are completely normal, you will receive your results only by: Marland Kitchen MyChart Message (if you have MyChart) OR . A paper copy in the mail If you have any lab test that is abnormal or we need to change your treatment, we will call you to review the results.   Testing/Procedures: None ordered   Follow-Up: At [facility], you and your health needs are our priority.  As part of our continuing mission to provide you with exceptional heart care, we have created designated Provider Care Teams.  These Care Teams include your primary Cardiologist (physician) and Advanced Practice Providers (APPs -  Physician Assistants and Nurse Practitioners) who all work together to provide you with the care you need, when you need it.  We recommend signing up for the patient portal called "MyChart".  Sign up information is provided on this After Visit Summary.  MyChart is used to connect with patients for Virtual Visits (Telemedicine).  Patients are able to view lab/test results, encounter notes, upcoming appointments, etc.  Non-urgent messages can be sent to your provider as well.   To learn more about what you can do with MyChart, go to [email or web address].    Your next appointment:   6 week(s)  The format for your next appointment:   In Person  Provider:   You will see one of the following Advanced Practice Providers on your designated Care Team:    Azalee Course, PA-C  Micah Flesher, PA-C or   Judy Pimple, [place]

## 2021-01-18 ENCOUNTER — Encounter: Payer: Self-pay | Admitting: *Deleted

## 2021-01-25 ENCOUNTER — Telehealth: Payer: Self-pay | Admitting: Cardiovascular Disease

## 2021-01-25 DIAGNOSIS — R002 Palpitations: Secondary | ICD-10-CM

## 2021-01-25 MED ORDER — CARVEDILOL 6.25 MG PO TABS: ORAL_TABLET | ORAL | 3 refills | Status: DC

## 2021-01-25 NOTE — Telephone Encounter (Signed)
Let's try taking the carvedilol 6.25 mg in AM and 12.5 mg in the evening (she can take the evening dose a little early, 2-3 hours before bedtime,  so it can kick in by the time she goes to bed). The reason I wanted to decrease the carvedilol was the bradycardia rather than just the BP, but she did not have symptoms of bradycardia, so just have her keep an eye on HR (call us if <50) and BP (call uss if SBP<95 or dizzy/presyncopal). Thanks

## 2021-01-25 NOTE — Telephone Encounter (Signed)
Pt c/o medication issue: 1. Name of Medication: Carvedilol 4.25 was change to 6.25 and also put her on amlodipine  2. How are you currently taking this medication (dosage and times per day)? Both are taken once a day 3. Are you having a reaction (difficulty breathing--STAT)?  SOB 4. What is your medication issue? Patient says she is waken up with her heart is racing.    Patient c/o Palpitations:  High priority if patient c/o lightheadedness, shortness of breath, or chest pain  1) How long have you had palpitations/irregular HR/ Afib? Are you having the symptoms now? No   2) Are you currently experiencing lightheadedness, SOB or CP? SOB lightheadedness  3) Do you have a history of afib (atrial fibrillation) or irregular heart rhythm? No   4) Have you checked your BP or HR? (document readings if available): yesterday 118/she thinks 80  5) Are you experiencing any other symptoms? No

## 2021-01-25 NOTE — Telephone Encounter (Signed)
Left message for pt to call.

## 2021-01-25 NOTE — Telephone Encounter (Signed)
Spoke with pt, since her carvedilol was decreased to 6.25 mg twice daily and the amlodipine was started she is being wakened with her heart fluttering. She reports she will wake out of deep sleep and will feel her heart fluttering. She does not really notice it during the day as she has so many other issues that it does not bother her during the day. When she wakes her heart rate will be 80-90 bpm per her oximeter. Her bp yesterday at doctors office was 118/80, she does not have a way of checking it at home. She reports this happened in the past when the carvedilol dose was adjusted in the past. Aware dr croitoru is working in the hospital today but will forward for his review.

## 2021-01-25 NOTE — Telephone Encounter (Signed)
Patient returning call.

## 2021-01-25 NOTE — Telephone Encounter (Signed)
Spoke with pt, aware of dr croitoru's recommendations. She will monitor her pulse and bp. She will call when refill needed.

## 2021-02-28 ENCOUNTER — Ambulatory Visit (INDEPENDENT_AMBULATORY_CARE_PROVIDER_SITE_OTHER): Payer: Medicare Other | Admitting: Physician Assistant

## 2021-02-28 ENCOUNTER — Other Ambulatory Visit: Payer: Self-pay

## 2021-02-28 ENCOUNTER — Encounter: Payer: Self-pay | Admitting: Physician Assistant

## 2021-02-28 VITALS — BP 100/78 | HR 83 | Ht 63.0 in | Wt 256.0 lb

## 2021-02-28 DIAGNOSIS — E119 Type 2 diabetes mellitus without complications: Secondary | ICD-10-CM | POA: Diagnosis not present

## 2021-02-28 DIAGNOSIS — I2510 Atherosclerotic heart disease of native coronary artery without angina pectoris: Secondary | ICD-10-CM | POA: Diagnosis not present

## 2021-02-28 DIAGNOSIS — I10 Essential (primary) hypertension: Secondary | ICD-10-CM | POA: Diagnosis not present

## 2021-02-28 DIAGNOSIS — E785 Hyperlipidemia, unspecified: Secondary | ICD-10-CM

## 2021-02-28 DIAGNOSIS — Z86718 Personal history of other venous thrombosis and embolism: Secondary | ICD-10-CM

## 2021-02-28 NOTE — Patient Instructions (Signed)
Medication Instructions:  ?Your physician recommends that you continue on your current medications as directed. Please refer to the Current Medication list given to you today. ? ?*If you need a refill on your cardiac medications before your next appointment, please call your pharmacy* ? ?Lab Work: ?NONE ordered at this time of appointment  ? ?If you have labs (blood work) drawn today and your tests are completely normal, you will receive your results only by: ?MyChart Message (if you have MyChart) OR ?A paper copy in the mail ?If you have any lab test that is abnormal or we need to change your treatment, we will call you to review the results. ? ?Testing/Procedures: ?NONE ordered at this time of appointment  ? ?Follow-Up: ?At [facility], you and your health needs are our priority.  As part of our continuing mission to provide you with exceptional heart care, we have created designated Provider Care Teams.  These Care Teams include your primary Cardiologist (physician) and Advanced Practice Providers (APPs -  Physician Assistants and Nurse Practitioners) who all work together to provide you with the care you need, when you need it. ? ?We recommend signing up for the patient portal called "MyChart".  Sign up information is provided on this After Visit Summary.  MyChart is used to connect with patients for Virtual Visits (Telemedicine).  Patients are able to view lab/test results, encounter notes, upcoming appointments, etc.  Non-urgent messages can be sent to your provider as well.   ?To learn more about what you can do with MyChart, go to [email or web address].   ? ?Your next appointment:   ?3 month(s) ? ?The format for your next appointment:   ?In Person ? ?Provider:   ?Mihai Croitoru, MD   ? ?Other Instructions ? ? ?

## 2021-02-28 NOTE — Progress Notes (Signed)
Cardiology Office Note:    Date:  03/03/2021   ID:  [name], DOB 04-Mar-1951, MRN [id number]  PCP:  Merlene Laughter, MD   [facility] Providers Cardiologist:  Thurmon Fair, MD     Referring MD: Merlene Laughter, MD   Chief Complaint  Patient presents with   Follow-up    Seen for Dr. Royann Shivers    History of Present Illness:    Mary Ferguson is a 70 y.o. female with a hx of hypertension, hyperlipidemia, DM 2, morbid obesity, history of recurrent DVT, CKD stage III and CAD.  Patient was diagnosed with inferior wall STEMI in October 2020 and received drug-eluting stents x 2 to the mid to distal RCA, 70% mid LAD lesion, EF 40% by angiography.  Echocardiogram obtained on the following day showed EF 60 to 65%.  LVEDP was elevated at 22 mmHg.  Repeat angiography in April 2021 showed no obvious change.  She does not like to take the lower dose of Xarelto and prefers to take the full 20 mg daily.  She was just seen by Dr. Royann Shivers on 01/17/2021, due to problem associated with bradycardia, carvedilol was reduced back to 6.25 mg twice a day.  Amlodipine 2.5 mg daily was added to her medical regimen.  She contact cardiology recently due to fluttering sensation that is waking her up from sleep, carvedilol was changed to 6.25 mg a.m. and 12.5 mg p.m.  Patient presents to cardiology service today for reassessment along with her son.  She continues to have dizzy spell and shortness of breath with physical exertion, she attributed this to previous COVID-19 infection in October 2020.  She does have chronic fatigue as well.  Since starting on the slightly higher dose of carvedilol at night, her palpitation feeling has resolved.  She has chronic intermittent chest pain, however her chest pain has not increased in frequency or duration.   Past Medical History:  Diagnosis Date   Asthma    Chronic generalized abdominal pain    Chronic pain    CKD (chronic kidney disease), stage III (HCC)    Hypertension     Morbid obesity (HCC)    Recurrent deep vein thrombosis (DVT) (HCC)     Past Surgical History:  Procedure Laterality Date   CORONARY STENT INTERVENTION N/A 06/18/2019   Procedure: CORONARY STENT INTERVENTION;  Surgeon: Iran Ouch, MD;  Location: [facility];  Service: Cardiovascular;  Laterality: N/A;   EYE SURGERY     HERNIA REPAIR     LEFT HEART CATH AND CORONARY ANGIOGRAPHY N/A 06/18/2019   Procedure: LEFT HEART CATH AND CORONARY ANGIOGRAPHY;  Surgeon: Iran Ouch, MD;  Location: [facility];  Service: Cardiovascular;  Laterality: N/A;   LEFT HEART CATH AND CORONARY ANGIOGRAPHY N/A 11/30/2019   Procedure: LEFT HEART CATH AND CORONARY ANGIOGRAPHY;  Surgeon: Lennette Bihari, MD;  Location: [facility];  Service: Cardiovascular;  Laterality: N/A;   VEIN SURGERY      Current Medications: Current Meds  Medication Sig   acetaminophen (TYLENOL) 500 MG tablet Take 500 mg by mouth every 6 (six) hours as needed for mild pain or moderate pain.   amLODipine (NORVASC) 2.5 MG tablet Take 1 tablet (2.5 mg total) by mouth daily.   atorvastatin (LIPITOR) 80 MG tablet Take 1 tablet (80 mg total) by mouth every evening.   carvedilol (COREG) 6.25 MG tablet Take 1 tablet in the morning and 2 tablets in the evening   clobetasol ointment (  TEMOVATE) 0.05 % Apply 1 application topically daily as needed (Knots on face).   dicyclomine (BENTYL) 20 MG tablet Take 20 mg by mouth 4 (four) times daily as needed for spasms.    diphenoxylate-atropine (LOMOTIL) 2.5-0.025 MG tablet Take 1 tablet by mouth 4 (four) times daily as needed for diarrhea or loose stools.   famotidine (PEPCID) 40 MG tablet 1 tablet at bedtime.   fluticasone (VERAMYST) 27.5 MCG/SPRAY nasal spray Place into both nostrils as needed.   hydrOXYzine (ATARAX/VISTARIL) 25 MG tablet Take 25 mg by mouth every 6 (six) hours as needed for itching.   isosorbide mononitrate (IMDUR) 60 MG 24 hr tablet TAKE 1 TABLET(60 MG) BY  MOUTH DAILY   metroNIDAZOLE (METROCREAM) 0.75 % cream Apply 1 application topically daily as needed (Itching).   nitroGLYCERIN (NITROSTAT) 0.4 MG SL tablet Place 1 tablet (0.4 mg total) under the tongue every 5 (five) minutes x 3 doses as needed for chest pain.   promethazine (PHENERGAN) 25 MG tablet Take 25 mg by mouth daily as needed for nausea.   psyllium (METAMUCIL SMOOTH TEXTURE) 28 % packet Take 1 packet by mouth 3 (three) times daily as needed (constipation). unflavored sugar-free   rivaroxaban (XARELTO) 20 MG TABS tablet Take 1 tablet (20 mg total) by mouth every evening.     Allergies:   Rofecoxib, Tetracyclines & related, Benadryl allergy [diphenhydramine hcl], Cephalexin, and Gatifloxacin   Social History   Socioeconomic History   Marital status: Divorced    Spouse name: Not on file   Number of children: Not on file   Years of education: Not on file   Highest education level: Not on file  Occupational History   Not on file  Tobacco Use   Smoking status: Former    Pack years: 0.00   Smokeless tobacco: Never  Vaping Use   Vaping Use: Never used  Substance and Sexual Activity   Alcohol use: Never   Drug use: Never   Sexual activity: Not on file  Other Topics Concern   Not on file  Social History Narrative   ** Merged History Encounter **       Social Determinants of Health   Financial Resource Strain: Not on file  Food Insecurity: Not on file  Transportation Needs: Not on file  Physical Activity: Not on file  Stress: Not on file  Social Connections: Not on file     Family History: The patient's family history is not on file.  ROS:   Please see the history of present illness.     All other systems reviewed and are negative.  EKGs/Labs/Other Studies Reviewed:    The following studies were reviewed today:  Cath 11/30/2019 Previously placed Prox RCA to Mid RCA stent (unknown type) is widely patent.  Previously placed Dist RCA stent (unknown type) is widely  patent.  Prox Cx to Mid Cx lesion is 30% stenosed.  Prox LAD lesion is 30% stenosed.  Mid LAD-1 lesion is 40% stenosed.  Mid LAD-2 lesion is 70% stenosed.  Dist LAD lesion is 40% stenosed. Widely patent mid and distal RCA stents in the dominant RCA vessel.  Mild to moderate concomitant CAD with 30 and 40% proximal LAD stenoses, no change in the previously noted focal 70% mid LAD stenosis with 40% narrowing beyond this segment; normal small ramus intermediate vessel and tortuous circumflex vessel with 30% proximal narrowing.  LVEDP 8 mmHg  RECOMMENDATION:  Continue Plavix and resume Xarelto tomorrow. Will DC aspirin. Plan increase medical therapy  trial with titration of carvedilol to 6.25 mg twice a day and isosorbide to 60 mg daily. Aggressive lipid-lowering therapy with target LDL less than 70. Optimal blood pressure control with target blood pressure less than 130/80 and ideal blood pressure less than 120/80. Patient will follow up w ith Dr. Royann Shivers.   EKG:  EKG is not ordered today.    Recent Labs: 01/17/2021: BUN 22; Creatinine, Ser 0.95; Potassium 4.8; Sodium 141  Recent Lipid Panel    Component Value Date/Time   CHOL 125 01/17/2021 0935   TRIG 107 01/17/2021 0935   HDL 44 01/17/2021 0935   CHOLHDL 2.8 01/17/2021 0935   CHOLHDL 5.2 06/18/2019 0348   VLDL 29 06/18/2019 0348   LDLCALC 61 01/17/2021 0935     Risk Assessment/Calculations:           Physical Exam:    VS:  BP 100/78 (BP Location: Left Arm)   Pulse 83   Ht 5\' 3"  (1.6 m)   Wt 256 lb (116.1 kg)   SpO2 95%   BMI 45.35 kg/m     Wt Readings from Last 3 Encounters:  02/28/21 256 lb (116.1 kg)  01/17/21 256 lb 9.6 oz (116.4 kg)  04/19/20 254 lb 9.6 oz (115.5 kg)     GEN:  Well nourished, well developed in no acute distress HEENT: Normal NECK: No JVD; No carotid bruits LYMPHATICS: No lymphadenopathy CARDIAC: RRR, no murmurs, rubs, gallops RESPIRATORY:  Clear to auscultation without rales, wheezing or  rhonchi  ABDOMEN: Soft, non-tender, non-distended MUSCULOSKELETAL:  No edema; No deformity  SKIN: Warm and dry NEUROLOGIC:  Alert and oriented x 3 PSYCHIATRIC:  Normal affect   ASSESSMENT:    1. Coronary artery disease involving native coronary artery of native heart without angina pectoris   2. Essential hypertension   3. Hyperlipidemia LDL goal <70   4. Controlled type 2 diabetes mellitus without complication, without long-term current use of insulin (HCC)   5. History of DVT of lower extremity    PLAN:    In order of problems listed above:  CAD: On Lipitor and carvedilol.  Patient has chronic chest pain, however this has not changed recently.  Palpitations improved after adjustment of carvedilol recently.  Hypertension: Blood pressure borderline on today's therapy.  Hyperlipidemia: On Lipitor  DM2: Managed by primary care provider  History of recurrent DVT: On Xarelto         Medication Adjustments/Labs and Tests Ordered: Current medicines are reviewed at length with the patient today.  Concerns regarding medicines are outlined above.  No orders of the defined types were placed in this encounter.  No orders of the defined types were placed in this encounter.   Patient Instructions  Medication Instructions:  Your physician recommends that you continue on your current medications as directed. Please refer to the Current Medication list given to you today.  *If you need a refill on your cardiac medications before your next appointment, please call your pharmacy*  Lab Work: NONE ordered at this time of appointment   If you have labs (blood work) drawn today and your tests are completely normal, you will receive your results only by: MyChart Message (if you have MyChart) OR A paper copy in the mail If you have any lab test that is abnormal or we need to change your treatment, we will call you to review the results.  Testing/Procedures: NONE ordered at this time  of appointment   Follow-Up: At [facility], you and your  health needs are our priority.  As part of our continuing mission to provide you with exceptional heart care, we have created designated Provider Care Teams.  These Care Teams include your primary Cardiologist (physician) and Advanced Practice Providers (APPs -  Physician Assistants and Nurse Practitioners) who all work together to provide you with the care you need, when you need it.  We recommend signing up for the patient portal called "MyChart".  Sign up information is provided on this After Visit Summary.  MyChart is used to connect with patients for Virtual Visits (Telemedicine).  Patients are able to view lab/test results, encounter notes, upcoming appointments, etc.  Non-urgent messages can be sent to your provider as well.   To learn more about what you can do with MyChart, go to [email or web address].    Your next appointment:   3 month(s)  The format for your next appointment:   In Person  Provider:   Thurmon Fair, MD  Other Instructions    Signed, Azalee Course, [place]  03/03/2021 12:01 AM    [facility]

## 2021-03-02 ENCOUNTER — Encounter: Payer: Self-pay | Admitting: Physician Assistant

## 2021-03-02 ENCOUNTER — Ambulatory Visit: Payer: Medicare Other | Admitting: Family Medicine

## 2021-03-21 ENCOUNTER — Encounter: Payer: Self-pay | Admitting: Family Medicine

## 2021-03-21 ENCOUNTER — Ambulatory Visit (INDEPENDENT_AMBULATORY_CARE_PROVIDER_SITE_OTHER): Payer: Medicare Other | Admitting: Family Medicine

## 2021-03-21 ENCOUNTER — Other Ambulatory Visit: Payer: Self-pay

## 2021-03-21 VITALS — BP 110/76 | HR 77 | Temp 97.9°F | Ht 60.0 in | Wt 258.0 lb

## 2021-03-21 DIAGNOSIS — G894 Chronic pain syndrome: Secondary | ICD-10-CM

## 2021-03-21 DIAGNOSIS — Z780 Asymptomatic menopausal state: Secondary | ICD-10-CM

## 2021-03-21 DIAGNOSIS — S32010A Wedge compression fracture of first lumbar vertebra, initial encounter for closed fracture: Secondary | ICD-10-CM

## 2021-03-21 DIAGNOSIS — R1084 Generalized abdominal pain: Secondary | ICD-10-CM

## 2021-03-21 DIAGNOSIS — E118 Type 2 diabetes mellitus with unspecified complications: Secondary | ICD-10-CM | POA: Insufficient documentation

## 2021-03-21 DIAGNOSIS — D6869 Other thrombophilia: Secondary | ICD-10-CM

## 2021-03-21 DIAGNOSIS — M5416 Radiculopathy, lumbar region: Secondary | ICD-10-CM

## 2021-03-21 DIAGNOSIS — Z7689 Persons encountering health services in other specified circumstances: Secondary | ICD-10-CM | POA: Diagnosis not present

## 2021-03-21 DIAGNOSIS — I2510 Atherosclerotic heart disease of native coronary artery without angina pectoris: Secondary | ICD-10-CM

## 2021-03-21 DIAGNOSIS — E785 Hyperlipidemia, unspecified: Secondary | ICD-10-CM

## 2021-03-21 DIAGNOSIS — I10 Essential (primary) hypertension: Secondary | ICD-10-CM

## 2021-03-21 DIAGNOSIS — I700 Atherosclerosis of aorta: Secondary | ICD-10-CM

## 2021-03-21 DIAGNOSIS — N1831 Chronic kidney disease, stage 3a: Secondary | ICD-10-CM

## 2021-03-21 DIAGNOSIS — J4540 Moderate persistent asthma, uncomplicated: Secondary | ICD-10-CM

## 2021-03-21 DIAGNOSIS — E538 Deficiency of other specified B group vitamins: Secondary | ICD-10-CM

## 2021-03-21 DIAGNOSIS — G8929 Other chronic pain: Secondary | ICD-10-CM

## 2021-03-21 DIAGNOSIS — E559 Vitamin D deficiency, unspecified: Secondary | ICD-10-CM

## 2021-03-21 DIAGNOSIS — J449 Chronic obstructive pulmonary disease, unspecified: Secondary | ICD-10-CM | POA: Insufficient documentation

## 2021-03-21 DIAGNOSIS — F330 Major depressive disorder, recurrent, mild: Secondary | ICD-10-CM

## 2021-03-21 DIAGNOSIS — J42 Unspecified chronic bronchitis: Secondary | ICD-10-CM

## 2021-03-21 DIAGNOSIS — Z9861 Coronary angioplasty status: Secondary | ICD-10-CM

## 2021-03-21 DIAGNOSIS — G4733 Obstructive sleep apnea (adult) (pediatric): Secondary | ICD-10-CM

## 2021-03-21 DIAGNOSIS — Z86718 Personal history of other venous thrombosis and embolism: Secondary | ICD-10-CM | POA: Diagnosis not present

## 2021-03-21 DIAGNOSIS — Z7901 Long term (current) use of anticoagulants: Secondary | ICD-10-CM

## 2021-03-21 DIAGNOSIS — G4734 Idiopathic sleep related nonobstructive alveolar hypoventilation: Secondary | ICD-10-CM

## 2021-03-21 DIAGNOSIS — R202 Paresthesia of skin: Secondary | ICD-10-CM

## 2021-03-21 DIAGNOSIS — I25119 Atherosclerotic heart disease of native coronary artery with unspecified angina pectoris: Secondary | ICD-10-CM

## 2021-03-21 DIAGNOSIS — G629 Polyneuropathy, unspecified: Secondary | ICD-10-CM

## 2021-03-21 DIAGNOSIS — I252 Old myocardial infarction: Secondary | ICD-10-CM

## 2021-03-21 HISTORY — DX: Asymptomatic menopausal state: Z78.0

## 2021-03-21 NOTE — Progress Notes (Signed)
Patient ID: Mary Ferguson, female  DOB: 08/13/1951, 70 y.o.   MRN: [id number] Patient Care Team    Relationship Specialty Notifications Start End  Natalia Leatherwood, DO PCP - General Family Medicine  03/21/21   Croitoru, Rachelle Hora, MD Consulting Physician Cardiology  03/21/21   Mateo Flow, MD Consulting Physician Ophthalmology  03/21/21   Enzo Bi, MD Referring Physician Surgery  03/21/21   Aletha Halim, MD Referring Physician Neurology  03/21/21   Lennette Bihari, MD Consulting Physician Cardiology  03/21/21   Doloris Hall., MD Physician Assistant Sports Medicine  03/21/21   Donnetta Simpers, MD Referring Physician Dermatology  03/21/21   Moss Mc, MD Referring Physician Pulmonary Disease  03/21/21   Kerin Salen, MD Consulting Physician Gastroenterology  03/21/21     Chief Complaint  Patient presents with   Establish Care    [facility]    Subjective: Mary Ferguson is a 70 y.o.  female present for new patient establishment.  She has a very complicated past medical history. All past medical history, surgical history, allergies, family history, immunizations, medications and social history were updated in the electronic medical record today. All recent labs, ED visits and hospitalizations within the last year were reviewed.  Type II diabetes mellitus with manifestations [facility]) Patient reports she has a history of diabetes, but has never needed to start medication.  A1c's have been no higher than 6.6 per EMR review. PNA series: Awaiting all records, uncertain if she is received. Flu shot: (recommneded yearly) Foot exam: We will complete next visit once confirmed diabetes. Eye exam: We will refer next visit.  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  She is on lifelong anticoagulation therapy.  Uncertain hematological work-up in the past.  Essential hypertension/Dyslipidemia, goal LDL  below 70/CAD S/P PCI/History of non-ST elevation myocardial infarction (NSTEMI)//Morbid obesity (HCC)/Coronary artery disease involving native coronary artery of native heart with angina pectoris (HCC)/Aortic atherosclerosis (HCC)   Chronic generalized abdominal pain/Chronic pain syndrome/Neuropathy/Paresthesia of bilateral legs/Compression fracture of L1 vertebra, initial encounter (HCC)/ Lumbar radiculopathy Patient has an extensive chronic pain history.  Per her report she has been on very strong high-dose narcotics in the past, and her pain persisted.  She reports a strong family history of substance abuse, and is not interested in narcotics.  She currently is not on any type of medications to help with her discomfort.    B12 deficiency and Vitamin D deficiency Currently not on supplementation.  Mild episode of recurrent major depressive disorder [facility]) She had been managed by psychiatry/psychology in the past-pre-2018.  No records received.  Chronic bronchitis, unspecified chronic bronchitis type (HCC)/Moderate persistent asthma, unspecified whether complicated/Nocturnal oxygen desaturation/Obstructive sleep apnea (adult)  Patient has a history of COPD, asthma, OSA and reports she uses oxygen at night.  She does not know who prescribes her oxygen and states she thinks she got it from the hospital admission.  She is an established with pulmonology at this time.  She does not wear her CPAP because it caused her pain across the bridge of her nose.  She is not prescribed any inhalers, and states she stopped using them a while ago when she could not afford them.  No flowsheet data found. No flowsheet data found.      Fall Risk  03/21/2021  Falls in the past year? 1  Number falls in past yr: 1  Injury with Fall? 0  Risk for fall due to : History of fall(s)  Follow up Falls evaluation completed   Immunization History  Administered Date(s) Administered   Influenza, Seasonal, Injecte,  Preservative Fre 06/22/2015   Influenza,inj,quad, With Preservative 07/05/2016   Influenza-Unspecified 05/10/2013, 05/05/2014   Moderna Sars-Covid-2 Vaccination 11/04/2019, 12/02/2019   PFIZER(Purple Top)SARS-COV-2 Vaccination 08/12/2020   Pneumococcal Conjugate-13 08/26/2004    No results found.  Past Medical History:  Diagnosis Date   Acute and chronic respiratory failure with hypoxia (HCC) 01/24/2018   Asthma    Blood in stool    Chicken pox    Chronic generalized abdominal pain    Chronic pain    CKD (chronic kidney disease), stage III (HCC)    Colon polyps    COPD (chronic obstructive pulmonary disease) (HCC)    COVID-19 2020   Depression    Diverticulitis    Very severe.  Has had 2 bowel resections and temporary colostomy in the past.   Fecal incontinence    Frequent headaches    GERD (gastroesophageal reflux disease)    Heart attack (HCC)    Heart disease    History of blood transfusion    Hyperlipemia    Hypertension    Morbid obesity (HCC)    Pneumonia due to infectious organism 01/12/2018   PTSD (post-traumatic stress disorder)    Recurrent deep vein thrombosis (DVT) (HCC)    x6   Urinary incontinence    Allergies  Allergen Reactions   Rofecoxib Anaphylaxis    VIOXX   Tetracyclines & Related Nausea And Vomiting   Benadryl Allergy [Diphenhydramine Hcl] Other (See Comments)    Depressed respirations   Cephalexin Rash   Gatifloxacin Other (See Comments)    Confusion Very low blood pressure Tee[place]inn   Past Surgical History:  Procedure Laterality Date   ABDOMINAL HYSTERECTOMY  1995   APPENDECTOMY     CATARACT EXTRACTION Bilateral    CESAREAN SECTION  1984   CHOLECYSTECTOMY  2016   COLOSTOMY     and reversal   CORONARY STENT INTERVENTION N/A 06/18/2019   Procedure: CORONARY STENT INTERVENTION;  Surgeon: Iran OuchArida, Muhammad A, MD;  Location: [facility] INVASIVE CV LAB;  Service: Cardiovascular;  Laterality: N/A;   ENDOMETRIAL ABLATION     HERNIA REPAIR  1985    1985 and 2016 (2016 ventral w/ incarceration) 33 surgeries   HYSTEROSCOPY WITH D & C     13   LAPAROSCOPIC LYSIS OF ADHESIONS     x2   LEFT HEART CATH AND CORONARY ANGIOGRAPHY N/A 06/18/2019   Procedure: LEFT HEART CATH AND CORONARY ANGIOGRAPHY;  Surgeon: Iran OuchArida, Muhammad A, MD;  Location: [facility] INVASIVE CV LAB;  Service: Cardiovascular;  Laterality: N/A;   LEFT HEART CATH AND CORONARY ANGIOGRAPHY N/A 11/30/2019   Procedure: LEFT HEART CATH AND CORONARY ANGIOGRAPHY;  Surgeon: Lennette BihariKelly, Thomas A, MD;  Location: [facility] INVASIVE CV LAB;  Service: Cardiovascular;  Laterality: N/A;   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   TONSILLECTOMY  1959   VEIN SURGERY     laser x2   Family History  Problem Relation Age of Onset   Depression Mother    Drug abuse Mother    Mental illness Mother    Learning disabilities Mother    Miscarriages / [place] Mother    Alcohol abuse Mother    Mental illness Father    Depression Father    Heart disease Father    Drug abuse Sister    Depression Sister    Asthma  Sister    Alcohol abuse Sister    Early death Sister    Mental illness Son    Hypertension Son    Drug abuse Son    Depression Son    Asthma Son    Alcohol abuse Son    Clotting disorder Son    Heart disease Maternal Grandmother    Diabetes Maternal Grandmother    Arthritis Maternal Grandmother    COPD Maternal Grandmother    Parkinson's disease Maternal Grandmother    Heart disease Maternal Grandfather    Heart attack Maternal Grandfather    Early death Maternal Grandfather    COPD Maternal Grandfather    Heart disease Paternal Grandmother    Heart attack Paternal Grandmother    Depression Paternal Grandmother    Arthritis Paternal Grandmother    Breast cancer Paternal Grandmother    Heart disease Paternal Grandfather    Heart attack Paternal Grandfather    Early death Paternal Grandfather    COPD Paternal Grandfather    Mental illness Half-Brother    Learning disabilities  Half-Brother    Hypertension Half-Brother    Hyperlipidemia Half-Brother    Drug abuse Half-Brother    Heart disease Half-Brother    Diabetes Half-Brother    Depression Half-Brother    Asthma Half-Brother    Alcohol abuse Half-Brother    Arthritis Half-Brother    Kidney disease Half-Brother    Testicular cancer Half-Brother    Diabetes Half-Sister    Mental illness Half-Sister    Learning disabilities Half-Sister    Drug abuse Half-Sister    Depression Half-Sister    Alcohol abuse Half-Sister    Thyroid cancer Half-Sister    Social History   Social History Narrative   Marital status/children/pets:    Education/employment: retired     - wears seatbelt: Yes     - Feels safe in their relationships: Yes       Allergies as of 03/21/2021       Reactions   Rofecoxib Anaphylaxis   VIOXX   Tetracyclines & Related Nausea And Vomiting   Benadryl Allergy [diphenhydramine Hcl] Other (See Comments)   Depressed respirations   Cephalexin Rash   Gatifloxacin Other (See Comments)   Confusion Very low blood pressure T[place]quinn        Medication List        Accurate as of March 21, 2021 11:59 PM. If you have any questions, ask your nurse or doctor.          STOP taking these medications    metroNIDAZOLE 0.75 % cream Commonly known as: METROCREAM Stopped by: Felix Pacini, DO   promethazine 25 MG tablet Commonly known as: PHENERGAN Stopped by: Felix Pacini, DO       TAKE these medications    acetaminophen 500 MG tablet Commonly known as: TYLENOL Take 500 mg by mouth every 6 (six) hours as needed for mild pain or moderate pain.   amitriptyline 10 MG tablet Commonly known as: ELAVIL Take 1 tablet (10 mg total) by mouth at bedtime. Started by: Felix Pacini, DO   amLODipine 2.5 MG tablet Commonly known as: NORVASC Take 1 tablet (2.5 mg total) by mouth daily.   atorvastatin 80 MG tablet Commonly known as: LIPITOR Take 1 tablet (80 mg total) by mouth every  evening.   carvedilol 6.25 MG tablet Commonly known as: COREG Take 1 tablet in the morning and 2 tablets in the evening   clobetasol ointment 0.05 % Commonly known as: TEMOVATE Apply 1 application  topically daily as needed (Knots on face).   dicyclomine 20 MG tablet Commonly known as: BENTYL Take 1 tablet (20 mg total) by mouth 4 (four) times daily as needed for spasms.   diphenoxylate-atropine 2.5-0.025 MG tablet Commonly known as: LOMOTIL Take 1 tablet by mouth 4 (four) times daily as needed for diarrhea or loose stools.   famotidine 40 MG tablet Commonly known as: PEPCID Take 1 tablet (40 mg total) by mouth at bedtime. What changed: how to take this Changed by: Felix Pacini, DO   fluticasone 27.5 MCG/SPRAY nasal spray Commonly known as: VERAMYST Place into both nostrils as needed.   hydrOXYzine 25 MG tablet Commonly known as: ATARAX/VISTARIL Take 1 tablet (25 mg total) by mouth every 8 (eight) hours as needed for itching. What changed: when to take this Changed by: Felix Pacini, DO   isosorbide mononitrate 60 MG 24 hr tablet Commonly known as: IMDUR TAKE 1 TABLET(60 MG) BY MOUTH DAILY   nitroGLYCERIN 0.4 MG SL tablet Commonly known as: NITROSTAT Place 1 tablet (0.4 mg total) under the tongue every 5 (five) minutes x 3 doses as needed for chest pain.   psyllium 28 % packet Commonly known as: METAMUCIL SMOOTH TEXTURE Take 1 packet by mouth 3 (three) times daily as needed (constipation). unflavored sugar-free   rivaroxaban 20 MG Tabs tablet Commonly known as: XARELTO Take 1 tablet (20 mg total) by mouth every evening.        All past medical history, surgical history, allergies, family history, immunizations andmedications were updated in the EMR today and reviewed under the history and medication portions of their EMR.         ROS: 14 pt review of systems performed and negative (unless mentioned in an HPI)  Objective: BP 110/76   Pulse 77   Temp 97.9 F  (36.6 C) (Oral)   Ht 5' (1.524 m)   Wt 258 lb (117 kg)   SpO2 95%   BMI 50.39 kg/m  Gen: Afebrile. No acute distress. Nontoxic in appearance, well-developed, well-nourished, very pleasant obese female. HENT: AT. [place]. .  No cough on exam, no hoarseness on exam. Eyes:Pupils Equal Round Reactive to light, Extraocular movements intact,  Conjunctiva without redness, discharge or icterus. Neck/lymp/endocrine: Supple, no lymphadenopathy, no thyromegaly CV: RRR , no edema, +2/4 P posterior tibialis pulses.  Chest: CTAB, no wheeze, rhonchi or crackles.  Normal respiratory effort.  Good air movement. Skin: Warm and well-perfused. Skin intact. Neuro/Msk: Ambulates with cane. PERLA. EOMi. Alert. Oriented x3.   Psych: Normal affect, dress and demeanor. Normal speech. Normal thought content and judgment.   Assessment/plan: Audra Kagel is a 70 y.o. female present for establish care Type II diabetes mellitus with manifestations (HCC) Diabetes is diet controlled.  A1c has never been higher than 6.6. We will recheck A1c today. With her cardiac condition she would benefit from [place] start if medication is needed, or felt could be tolerated. - CBC w/Diff - Hemoglobin A1c - Urine Microalbumin w/creat. 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  Essential hypertension/Dyslipidemia, goal LDL below 70/CAD S/P PCI/History of non-ST elevation myocardial infarction (NSTEMI)//Morbid obesity (HCC)/Coronary artery disease involving native coronary artery of native heart with angina pectoris (HCC)/Aortic atherosclerosis (HCC) Blood pressure is stable. Medications are managed by cardiology-amlodipine, atorvastatin, carvedilol, Imdur, nitro, Xarelto  Chronic generalized abdominal pain/Chronic pain syndrome/Neuropathy/Paresthesia of bilateral legs/Compression fracture of L1 vertebra, initial encounter (HCC)/ Lumbar  radiculopathy -Lengthy discussion today surrounding chronic pain.  She has reportedly been on many pain medicines in the past and did not find them helpful.  She is not interested in narcotics.  She has history of compression fraction and lumbar radiculopathy (L4-L5 region). I believe her pain is multifactorial.  She recently has evidence of orthopedic causes and her chronic abdominal pain secondary to severe diverticulosis and multiple abdominal surgeries.  There also may be a pain syndrome component given her past history and history of depression. -Considered Cymbalta-may be an option later. -Consider Lyrica-also may be an option later.   -Start Elavil 10 mg nightly.  Patient was educated this was a very low-dose, do not stop abruptly because she does not think it is working.  Only follow-up if we need to we can increase this dose.  Need to start low and go slow.  She reports understanding.  Hopefully this will help with neuropathic pain, depression and she may benefit with GI complaints. - Iron, TIBC and Ferritin Panel, B12 and vitamin D collected -NSAIDs contraindicated with her cardiac history and Xarelto use. - Consider imaging of lumbar spine versus orthopedic referral  B12 deficiency and Vitamin D deficiency Currently not on any supplements with history of deficiencies. - B12 - Vitamin D (25 hydroxy) - PTH, Intact and Calcium  Chronic bronchitis, unspecified chronic bronchitis type (HCC)/Moderate persistent asthma, unspecified whether complicated/Nocturnal oxygen desaturation/Obstructive sleep apnea (adult)  Patient is having fatigue with reported history of COPD, asthma, OSA and use of oxygen at night.  She has not seen a pulmonologist in many years and would benefit from reevaluation with pulmonology for the above conditions and treatment. - Ambulatory referral to Pulmonology    Return in about 6 weeks (around 05/02/2021) for [facility] (30 min).  Orders Placed This Encounter  Procedures    CBC w/Diff   Iron, TIBC and Ferritin Panel   Hemoglobin A1c   B12   Vitamin D (25 hydroxy)   PTH, Intact and Calcium   Urine Microalbumin w/creat. ratio   Ambulatory referral to Pulmonology    Meds ordered this encounter  Medications   hydrOXYzine (ATARAX/VISTARIL) 25 MG tablet    Sig: Take 1 tablet (25 mg total) by mouth every 8 (eight) hours as needed for itching.    Dispense:  90 tablet    Refill:  1   famotidine (PEPCID) 40 MG tablet    Sig: Take 1 tablet (40 mg total) by mouth at bedtime.    Dispense:  90 tablet    Refill:  1   dicyclomine (BENTYL) 20 MG tablet    Sig: Take 1 tablet (20 mg total) by mouth 4 (four) times daily as needed for spasms.    Dispense:  120 tablet    Refill:  5   clobetasol ointment (TEMOVATE) 0.05 %    Sig: Apply 1 application topically daily as needed (Knots on face).    Dispense:  30 g    Refill:  5   amitriptyline (ELAVIL) 10 MG tablet    Sig: Take 1 tablet (10 mg total) by mouth at bedtime.    Dispense:  90 tablet    Refill:  0    Referral Orders  Ambulatory referral to Pulmonology    > 60 Minutes was dedicated to this patient's encounter to include review of chart, face-to-face time with patient and post-visit work- which include documentation and prescribing medications and/or ordering test when necessary.    Note is dictated utilizing voice recognition software. Although note has been proof read prior to  signing, occasional typographical errors still can be missed. If any questions arise, please do not hesitate to call for verification.  Electronically signed by: Felix Pacini, DO [facility] [place]

## 2021-03-21 NOTE — Patient Instructions (Signed)
Nice to meet to you today.   We will call you with lab results and further plan depending on the results.

## 2021-03-22 ENCOUNTER — Encounter: Payer: Self-pay | Admitting: Family Medicine

## 2021-03-22 ENCOUNTER — Telehealth: Payer: Self-pay | Admitting: Family Medicine

## 2021-03-22 DIAGNOSIS — M5416 Radiculopathy, lumbar region: Secondary | ICD-10-CM | POA: Insufficient documentation

## 2021-03-22 DIAGNOSIS — R202 Paresthesia of skin: Secondary | ICD-10-CM | POA: Insufficient documentation

## 2021-03-22 DIAGNOSIS — S32010A Wedge compression fracture of first lumbar vertebra, initial encounter for closed fracture: Secondary | ICD-10-CM | POA: Insufficient documentation

## 2021-03-22 DIAGNOSIS — I700 Atherosclerosis of aorta: Secondary | ICD-10-CM | POA: Insufficient documentation

## 2021-03-22 DIAGNOSIS — Z86010 Personal history of colonic polyps: Secondary | ICD-10-CM

## 2021-03-22 DIAGNOSIS — G629 Polyneuropathy, unspecified: Secondary | ICD-10-CM | POA: Insufficient documentation

## 2021-03-22 DIAGNOSIS — I25119 Atherosclerotic heart disease of native coronary artery with unspecified angina pectoris: Secondary | ICD-10-CM | POA: Insufficient documentation

## 2021-03-22 DIAGNOSIS — K5792 Diverticulitis of intestine, part unspecified, without perforation or abscess without bleeding: Secondary | ICD-10-CM

## 2021-03-22 LAB — PTH, INTACT AND CALCIUM
Calcium: 10.2 mg/dL (ref 8.6–10.4)
PTH: 70 pg/mL (ref 16–77)

## 2021-03-22 LAB — CBC WITH DIFFERENTIAL/PLATELET
Absolute Monocytes: 725 cells/uL (ref 200–950)
Basophils Absolute: 39 cells/uL (ref 0–200)
Basophils Relative: 0.4 %
Eosinophils Absolute: 353 cells/uL (ref 15–500)
Eosinophils Relative: 3.6 %
HCT: 44.7 % (ref 35.0–45.0)
Hemoglobin: 14.7 g/dL (ref 11.7–15.5)
Lymphs Abs: 2009 cells/uL (ref 850–3900)
MCH: 26.9 pg — ABNORMAL LOW (ref 27.0–33.0)
MCHC: 32.9 g/dL (ref 32.0–36.0)
MCV: 81.9 fL (ref 80.0–100.0)
MPV: 10 fL (ref 7.5–12.5)
Monocytes Relative: 7.4 %
Neutro Abs: 6674 cells/uL (ref 1500–7800)
Neutrophils Relative %: 68.1 %
Platelets: 291 10*3/uL (ref 140–400)
RBC: 5.46 10*6/uL — ABNORMAL HIGH (ref 3.80–5.10)
RDW: 14.5 % (ref 11.0–15.0)
Total Lymphocyte: 20.5 %
WBC: 9.8 10*3/uL (ref 3.8–10.8)

## 2021-03-22 LAB — VITAMIN D 25 HYDROXY (VIT D DEFICIENCY, FRACTURES): Vit D, 25-Hydroxy: 21 ng/mL — ABNORMAL LOW (ref 30–100)

## 2021-03-22 LAB — IRON,TIBC AND FERRITIN PANEL
%SAT: 18 % (calc) (ref 16–45)
Ferritin: 33 ng/mL (ref 16–288)
Iron: 73 ug/dL (ref 45–160)
TIBC: 416 mcg/dL (calc) (ref 250–450)

## 2021-03-22 LAB — HEMOGLOBIN A1C
Hgb A1c MFr Bld: 6.6 % of total Hgb — ABNORMAL HIGH (ref <5.7)
Mean Plasma Glucose: 143 mg/dL
eAG (mmol/L): 7.9 mmol/L

## 2021-03-22 LAB — VITAMIN B12: Vitamin B-12: 242 pg/mL (ref 200–1100)

## 2021-03-22 MED ORDER — DICYCLOMINE HCL 20 MG PO TABS: 20.0000 mg | ORAL_TABLET | Freq: Four times a day (QID) | ORAL | 5 refills | Status: DC | PRN

## 2021-03-22 MED ORDER — CLOBETASOL PROPIONATE 0.05 % EX OINT: 1.0000 "application " | TOPICAL_OINTMENT | Freq: Every day | CUTANEOUS | 5 refills | Status: DC | PRN

## 2021-03-22 MED ORDER — FAMOTIDINE 40 MG PO TABS: 40.0000 mg | ORAL_TABLET | Freq: Every day | ORAL | 1 refills | Status: DC

## 2021-03-22 MED ORDER — AMITRIPTYLINE HCL 10 MG PO TABS: 10.0000 mg | ORAL_TABLET | Freq: Every day | ORAL | 0 refills | Status: DC

## 2021-03-22 MED ORDER — CYANOCOBALAMIN 1000 MCG SL SUBL: SUBLINGUAL_TABLET | SUBLINGUAL | 3 refills | Status: DC

## 2021-03-22 MED ORDER — VITAMIN D3 50 MCG (2000 UT) PO CAPS: 2000.0000 [IU] | ORAL_CAPSULE | Freq: Every day | ORAL | 3 refills | Status: DC

## 2021-03-22 MED ORDER — HYDROXYZINE HCL 25 MG PO TABS: 25.0000 mg | ORAL_TABLET | Freq: Three times a day (TID) | ORAL | 1 refills | Status: DC | PRN

## 2021-03-22 NOTE — Telephone Encounter (Signed)
Spoke with pt regarding labs and instructions.   Note:  Pt would like to have a new referral to GI.

## 2021-03-22 NOTE — Telephone Encounter (Signed)
Please call patient Blood cell counts, iron levels and electrolytes are normal Diabetes/A1c is 6.6.  This is diabetic range but diet controlled.  Continue to avoid high sugary snacks and drinks.   Vitamin B12 is severely deficient and her vitamin D is also deficient.  Both of these will cause fatigue, muscle aches, numbness and tingling. I have called in a daily vitamin D supplementation of 2000 units daily for her to take with food. I have also called in a vitamin B12 sublingual tablet to place under the tongue daily.  If insurance does not pay for this, she can pick up over-the-counter just make sure sublingual.   -If she is able to get into the office, we can perform B12 injections on her as well.  Her levels are very low at 242.  We would like to see these levels at least above 500.  If she is able to come in for nurse visit for injections, she is approved for B12 injection 1000 mcg every 2 weeks for 4 doses, then monthly indefinitely.  I have called in a medicine for her to start before bed called Elavil.  It is a very low dose to start out.  This medication helps with neuropathic pain.  It is not addictive.  Please use remind her to have patience, and do not give up, dose may be enough for it may not be.  But we have to start low on dose to avoid side effects, follow-up closely and we will taper up as needed.  I have referred her to pulmonology to become established.  She will benefit from having repeat pulmonary function tests and get set up with appropriate treatment for her COPD/asthma, OSA and supplemental oxygen if felt needed.  Should be calling her to get her scheduled.  Please have her schedule follow-up with provider in 4-6 weeks for reevaluation on her complaints and pain.

## 2021-03-23 NOTE — Telephone Encounter (Signed)
Referral placed and pt aware   

## 2021-03-23 NOTE — Addendum Note (Signed)
Addended by: Maxie Barb on: 03/23/2021 01:29 PM   Modules accepted: Orders

## 2021-03-23 NOTE — Telephone Encounter (Signed)
Okay to place referral to digestive health specialist for hx of colon polyps and severe diverticulitis.   Please make patient aware that this is the only other GI team in this area, that I am aware of... there may be more near [place].    She was seen by Deboraha Sprang and [place] in the past.

## 2021-03-30 ENCOUNTER — Ambulatory Visit: Payer: Medicare Other

## 2021-04-13 ENCOUNTER — Ambulatory Visit (INDEPENDENT_AMBULATORY_CARE_PROVIDER_SITE_OTHER): Payer: Medicare Other

## 2021-04-13 ENCOUNTER — Other Ambulatory Visit: Payer: Self-pay

## 2021-04-13 DIAGNOSIS — E538 Deficiency of other specified B group vitamins: Secondary | ICD-10-CM | POA: Diagnosis not present

## 2021-04-13 MED ORDER — CYANOCOBALAMIN 1000 MCG/ML IJ SOLN
1000.0000 ug | INTRAMUSCULAR | Status: AC
  Administered 2021-04-13 – 2021-05-08 (×3): 1000 ug via INTRAMUSCULAR

## 2021-04-13 NOTE — Progress Notes (Signed)
Mary Ferguson is a 70 y.o. female presents to the office today for biweekly B12 injections, per physician's orders. Original order:  If she is able to come in for nurse visit for injections, she is approved for B12 injection 1000 mcg every 2 weeks for 4 doses, then monthly indefinitely. Cyancobalamin, , IM was administered right deltoid today. Patient tolerated injection. Patient due for follow up labs/provider appt: Yes. Date due: 05/08/21, appt made Yes Patient next injection due: 2 weeks, appt made No  Burhan Barham D Natlie Asfour  Please sign off in PCP absence

## 2021-04-22 ENCOUNTER — Other Ambulatory Visit: Payer: Self-pay | Admitting: Cardiovascular Disease

## 2021-04-27 ENCOUNTER — Other Ambulatory Visit: Payer: Self-pay

## 2021-04-27 ENCOUNTER — Ambulatory Visit (INDEPENDENT_AMBULATORY_CARE_PROVIDER_SITE_OTHER): Payer: Medicare Other

## 2021-04-27 DIAGNOSIS — E538 Deficiency of other specified B group vitamins: Secondary | ICD-10-CM

## 2021-04-27 NOTE — Progress Notes (Signed)
Helen Winterhalter is a 70 y.o. female presents to the office today for biweekly B12 injections, per physician's orders. Original order:  If she is able to come in for nurse visit for injections, she is approved for B12 injection 1000 mcg every 2 weeks for 4 doses, then monthly indefinitely. Cyancobalamin, , IM was administered right deltoid today. Patient tolerated injection. Patient due for follow up labs/provider appt: Yes. Date due: 05/08/21, appt made n/a Patient next injection due: 2 weeks, appt made No    Please sign off in PCP absence

## 2021-05-08 ENCOUNTER — Ambulatory Visit (INDEPENDENT_AMBULATORY_CARE_PROVIDER_SITE_OTHER): Payer: Medicare Other | Admitting: Family Medicine

## 2021-05-08 ENCOUNTER — Other Ambulatory Visit: Payer: Self-pay

## 2021-05-08 VITALS — BP 113/79 | HR 81 | Temp 98.0°F | Ht 60.0 in | Wt 261.8 lb

## 2021-05-08 DIAGNOSIS — Z23 Encounter for immunization: Secondary | ICD-10-CM

## 2021-05-08 DIAGNOSIS — G629 Polyneuropathy, unspecified: Secondary | ICD-10-CM

## 2021-05-08 DIAGNOSIS — E559 Vitamin D deficiency, unspecified: Secondary | ICD-10-CM

## 2021-05-08 DIAGNOSIS — E538 Deficiency of other specified B group vitamins: Secondary | ICD-10-CM

## 2021-05-08 LAB — VITAMIN D 25 HYDROXY (VIT D DEFICIENCY, FRACTURES): Vit D, 25-Hydroxy: 25 ng/mL — ABNORMAL LOW (ref 30–100)

## 2021-05-08 LAB — VITAMIN B12: Vitamin B-12: 1600 pg/mL — ABNORMAL HIGH (ref 200–1100)

## 2021-05-08 MED ORDER — DIPHENOXYLATE-ATROPINE 2.5-0.025 MG PO TABS: 1.0000 | ORAL_TABLET | Freq: Four times a day (QID) | ORAL | 2 refills | Status: DC | PRN

## 2021-05-08 MED ORDER — AMITRIPTYLINE HCL 25 MG PO TABS: 25.0000 mg | ORAL_TABLET | Freq: Every day | ORAL | 1 refills | Status: DC

## 2021-05-08 NOTE — Patient Instructions (Signed)
Increase amitriptyline to 20 mg before bed (2 tabs of current script you have at home). When you run out of current bottle, new bottle with be 25 mg a pill, therefore take one tab before bed.    Follow up in 5.5 months if meds working well, 1-2 months if desiring alteration.

## 2021-05-08 NOTE — Progress Notes (Signed)
Patient ID: Mary Ferguson, female  DOB: July 15, 1951, 70 y.o.   MRN: [id number] Patient Care Team    Relationship Specialty Notifications Start End  Natalia Leatherwood, DO PCP - General Family Medicine  03/21/21   Croitoru, Rachelle Hora, MD Consulting Physician Cardiology  03/21/21   Mateo Flow, MD Consulting Physician Ophthalmology  03/21/21   Enzo Bi, MD Referring Physician Surgery  03/21/21   Aletha Halim, MD Referring Physician Neurology  03/21/21   Lennette Bihari, MD Consulting Physician Cardiology  03/21/21   Doloris Hall., MD Physician Assistant Sports Medicine  03/21/21   Donnetta Simpers, MD Referring Physician Dermatology  03/21/21   Moss Mc, MD Referring Physician Pulmonary Disease  03/21/21   Kerin Salen, MD Consulting Physician Gastroenterology  03/21/21     Chief Complaint  Patient presents with   Follow-up    CMC; pain. She is currently taking elavil 10mg  nightly but has not noticed a difference    Subjective: Mary Ferguson is a 70 y.o.  female present for follow-up on new med start Chronic generalized abdominal pain/Chronic pain syndrome/Neuropathy/Paresthesia of bilateral legs/Compression fracture of L1 vertebra, initial encounter (HCC)/ Lumbar radiculopathy After last visit patient was started on amitriptyline 10 mg nightly.  She states she has not noticed much of a difference in the way of her pain.  It does not make her too sleepy. Prior note: Patient has an extensive chronic pain history.  Per her report she has been on very strong high-dose narcotics in the past, and her pain persisted.  She reports a strong family history of substance abuse, and is not interested in narcotics.  She currently is not on any type of medications to help with her discomfort.    B12 deficiency and Vitamin D deficiency Patient has been receiving B12 injections, as well as taking sublingual B12 at home.  She is due for B12 injection today.  She has also started the 4000  units of vitamin D daily.  She has noticed a little increase in energy, but not as much as she would hope.  Prior note/cmc: ( Documentation only) Type II diabetes mellitus with manifestations [facility]) Patient reports she has a history of diabetes, but has never needed to start medication.  A1c's have been no higher than 6.6 per EMR review. PNA series: Awaiting all records, uncertain if she is received. Flu shot: (recommneded yearly) Foot exam: We will complete next visit once confirmed diabetes. Eye exam: We will refer next visit.  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  She is on lifelong anticoagulation therapy.  Uncertain hematological work-up in the past.  Essential hypertension/Dyslipidemia, goal LDL below 70/CAD S/P PCI/History of non-ST elevation myocardial infarction (NSTEMI)//Morbid obesity (HCC)/Coronary artery disease involving native coronary artery of native heart with angina pectoris (HCC)/Aortic atherosclerosis (HCC)  Mild episode of recurrent major depressive disorder (HCC) She had been managed by psychiatry/psychology in the past-pre-2018.  No records received.  Chronic bronchitis, unspecified chronic bronchitis type (HCC)/Moderate persistent asthma, unspecified whether complicated/Nocturnal oxygen desaturation/Obstructive sleep apnea (adult)  Patient has a history of COPD, asthma, OSA and reports she uses oxygen at night.  She does not know who prescribes her oxygen and states she thinks she got it from the hospital admission.  She is an established with pulmonology at this time.  She does not wear her CPAP because it caused her pain across the bridge of her nose.  She is not prescribed any inhalers, and states she stopped using them a while ago when she could not afford them.  No flowsheet data found. No flowsheet data found.      Fall Risk  03/21/2021  Falls in the past year? 1   Number falls in past yr: 1  Injury with Fall? 0  Risk for fall due to : History of fall(s)  Follow up Falls evaluation completed   Immunization History  Administered Date(s) Administered   Fluad Quad(high Dose 65+) 05/08/2021   Influenza, Seasonal, Injecte, Preservative Fre 06/22/2015   Influenza,inj,quad, With Preservative 07/05/2016   Influenza-Unspecified 05/10/2013, 05/05/2014   MODERNA SARS COV-2 Pediatric Vaccination 37mos to <70yrs 08/12/2020, 04/02/2021   Moderna Sars-Covid-2 Vaccination 11/04/2019, 12/02/2019   Pneumococcal Conjugate-13 08/26/2004    No results found.  Past Medical History:  Diagnosis Date   Acute and chronic respiratory failure with hypoxia (HCC) 01/24/2018   Asthma    Blood in stool    Chicken pox    Chronic generalized abdominal pain    Chronic pain    CKD (chronic kidney disease), stage III (HCC)    Colon polyps    COPD (chronic obstructive pulmonary disease) (HCC)    COVID-19 2020   Depression    Diverticulitis    Very severe.  Has had 2 bowel resections and temporary colostomy in the past.   Fecal incontinence    Frequent headaches    GERD (gastroesophageal reflux disease)    Heart attack (HCC)    Heart disease    History of blood transfusion    Hyperlipemia    Hypertension    Morbid obesity (HCC)    Pneumonia due to infectious organism 01/12/2018   PTSD (post-traumatic stress disorder)    Recurrent deep vein thrombosis (DVT) (HCC)    x6   Urinary incontinence    Allergies  Allergen Reactions   Rofecoxib Anaphylaxis    VIOXX   Tetracyclines & Related Nausea And Vomiting   Benadryl Allergy [Diphenhydramine Hcl] Other (See Comments)    Depressed respirations   Cephalexin Rash   Gatifloxacin Other (See Comments)    Confusion Very low blood pressure Teequinn   Past Surgical History:  Procedure Laterality Date   ABDOMINAL HYSTERECTOMY  1995   APPENDECTOMY     CATARACT EXTRACTION Bilateral    CESAREAN SECTION  1984    CHOLECYSTECTOMY  2016   COLOSTOMY     and reversal   CORONARY STENT INTERVENTION N/A 06/18/2019   Procedure: CORONARY STENT INTERVENTION;  Surgeon: Iran Ouch, MD;  Location: [facility];  Service: Cardiovascular;  Laterality: N/A;   ENDOMETRIAL ABLATION     HERNIA REPAIR  1985   1985 and 2016 (2016 ventral w/ incarceration) 33 surgeries   HYSTEROSCOPY WITH D & C     13   LAPAROSCOPIC LYSIS OF ADHESIONS     x2   LEFT HEART CATH AND CORONARY ANGIOGRAPHY N/A 06/18/2019   Procedure: LEFT HEART CATH AND CORONARY ANGIOGRAPHY;  Surgeon: Iran Ouch, MD;  Location: [facility];  Service: Cardiovascular;  Laterality: N/A;   LEFT HEART CATH AND CORONARY ANGIOGRAPHY N/A 11/30/2019   Procedure: LEFT HEART CATH AND CORONARY ANGIOGRAPHY;  Surgeon: Lennette Bihari, MD;  Location: [facility];  Service: Cardiovascular;  Laterality: N/A;   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   TONSILLECTOMY  1959   VEIN SURGERY     laser x2   Family History  Problem Relation Age of Onset   Depression Mother    Drug abuse Mother    Mental illness Mother    Learning disabilities Mother    Miscarriages / [place] Mother    Alcohol abuse Mother    Mental illness Father    Depression Father    Heart disease Father    Drug abuse Sister    Depression Sister    Asthma Sister    Alcohol abuse Sister    Early death Sister    Mental illness Son    Hypertension Son    Drug abuse Son    Depression Son    Asthma Son    Alcohol abuse Son    Clotting disorder Son    Heart disease Maternal Grandmother    Diabetes Maternal Grandmother    Arthritis Maternal Grandmother    COPD Maternal Grandmother    Parkinson's disease Maternal Grandmother    Heart disease Maternal Grandfather    Heart attack Maternal Grandfather    Early death Maternal Grandfather    COPD Maternal Grandfather    Heart disease Paternal Grandmother    Heart attack Paternal Grandmother     Depression Paternal Grandmother    Arthritis Paternal Grandmother    Breast cancer Paternal Grandmother    Heart disease Paternal Grandfather    Heart attack Paternal Grandfather    Early death Paternal Grandfather    COPD Paternal Grandfather    Mental illness Half-Brother    Learning disabilities Half-Brother    Hypertension Half-Brother    Hyperlipidemia Half-Brother    Drug abuse Half-Brother    Heart disease Half-Brother    Diabetes Half-Brother    Depression Half-Brother    Asthma Half-Brother    Alcohol abuse Half-Brother    Arthritis Half-Brother    Kidney disease Half-Brother    Testicular cancer Half-Brother    Diabetes Half-Sister    Mental illness Half-Sister    Learning disabilities Half-Sister    Drug abuse Half-Sister    Depression Half-Sister    Alcohol abuse Half-Sister    Thyroid cancer Half-Sister    Social History   Social History Narrative   Marital status/children/pets:    Education/employment: retired     - wears seatbelt: Yes     - Feels safe in their relationships: Yes       Allergies as of 05/08/2021       Reactions   Rofecoxib Anaphylaxis   VIOXX   Tetracyclines & Related Nausea And Vomiting   Benadryl Allergy [diphenhydramine Hcl] Other (See Comments)   Depressed respirations   Cephalexin Rash   Gatifloxacin Other (See Comments)   Confusion Very low blood pressure T[place]inn        Medication List        Accurate as of May 08, 2021  5:59 PM. If you have any questions, ask your nurse or doctor.          acetaminophen 500 MG tablet Commonly known as: TYLENOL Take 500 mg by mouth every 6 (six) hours as needed for mild pain or moderate pain.   amitriptyline 25 MG tablet Commonly known as: ELAVIL Take 1 tablet (25 mg total) by mouth at bedtime. What changed:  medication strength how much to take Changed by: Felix Pacini, DO   amLODipine 2.5 MG tablet Commonly known as: NORVASC TAKE 1 TABLET(2.5 MG) BY MOUTH  DAILY   atorvastatin 80 MG tablet Commonly known as: LIPITOR Take 1 tablet (80 mg total) by mouth every evening.  carvedilol 6.25 MG tablet Commonly known as: COREG Take 1 tablet in the morning and 2 tablets in the evening   clobetasol ointment 0.05 % Commonly known as: TEMOVATE Apply 1 application topically daily as needed (Knots on face).   Cyanocobalamin 1000 MCG Subl Daily Place 1 tab underneath the tongue and allow to dissolve   dicyclomine 20 MG tablet Commonly known as: BENTYL Take 1 tablet (20 mg total) by mouth 4 (four) times daily as needed for spasms.   diphenoxylate-atropine 2.5-0.025 MG tablet Commonly known as: LOMOTIL Take 1 tablet by mouth 4 (four) times daily as needed for diarrhea or loose stools.   famotidine 40 MG tablet Commonly known as: PEPCID Take 1 tablet (40 mg total) by mouth at bedtime.   fluticasone 27.5 MCG/SPRAY nasal spray Commonly known as: VERAMYST Place into both nostrils as needed.   hydrOXYzine 25 MG tablet Commonly known as: ATARAX/VISTARIL Take 1 tablet (25 mg total) by mouth every 8 (eight) hours as needed for itching.   isosorbide mononitrate 60 MG 24 hr tablet Commonly known as: IMDUR TAKE 1 TABLET(60 MG) BY MOUTH DAILY   nitroGLYCERIN 0.4 MG SL tablet Commonly known as: NITROSTAT Place 1 tablet (0.4 mg total) under the tongue every 5 (five) minutes x 3 doses as needed for chest pain.   psyllium 28 % packet Commonly known as: METAMUCIL SMOOTH TEXTURE Take 1 packet by mouth 3 (three) times daily as needed (constipation). unflavored sugar-free   rivaroxaban 20 MG Tabs tablet Commonly known as: XARELTO Take 1 tablet (20 mg total) by mouth every evening.   Vitamin D3 50 MCG (2000 UT) capsule Take 1 capsule (2,000 Units total) by mouth daily.        All past medical history, surgical history, allergies, family history, immunizations andmedications were updated in the EMR today and reviewed under the history and  medication portions of their EMR.         ROS: 14 pt review of systems performed and negative (unless mentioned in an HPI)  Objective: BP 113/79   Pulse 81   Temp 98 F (36.7 C) (Oral)   Ht 5' (1.524 m)   Wt 261 lb 12.8 oz (118.8 kg)   SpO2 92%   BMI 51.13 kg/m  Gen: Afebrile. No acute distress.  HENT: AT. [place].  Eyes:Pupils Equal Round Reactive to light, Extraocular movements intact,  Conjunctiva without redness, discharge or icterus. CV: RRR no murmur, no edema Chest: CTAB, no wheeze or crackles Skin: No rashes, purpura or petechiae.  Neuro: Ambulates well with walker. PERLA. EOMi. Alert. Oriented x3 Psych: Normal affect, dress and demeanor. Normal speech. Normal thought content and judgment.    Assessment/plan: Mary Ferguson is a 70 y.o. female present for  Chronic pain syndrome/Neuropathy/Paresthesia of bilateral legs/Compression fracture of L1 vertebra, initial encounter (HCC)/ Lumbar radiculopathy/IBS-like pain -She has reportedly been on many pain medicines in the past and did not find them helpful.  She is not interested in narcotics.  She has history of compression fraction and lumbar radiculopathy (L4-L5 region). I believe her pain is multifactorial.  She recently has evidence of orthopedic causes and her chronic abdominal pain secondary to severe diverticulosis and multiple abdominal surgeries.  There also may be a pain syndrome component given her past history and history of depression. -Consider Cymbalta-may be an option later. -Consider Lyrica-also may be an option later.   -increase Elavil 25 mg nightly. Started low d/t pt concerns. She understands not stop abruptly because she does not  think it is working.  She reports understanding.  Hopefully this will help with neuropathic pain, depression and she may benefit with GI complaints. -NSAIDs contraindicated with her cardiac history and Xarelto use. - Consider imaging of lumbar spine versus orthopedic referral  B12  deficiency and Vitamin D deficiency Continue oral supplements and doses will be adjusted after results.  - B12 and vit d collected today - b12 injection provided after collection.   (Documentation only) Type II diabetes mellitus with manifestations (HCC) Diabetes is diet controlled.  A1c has never been higher than 6.6. We will recheck A1c today. With her cardiac condition she would benefit from [place] start if medication is needed, or felt could be tolerated.   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  Essential hypertension/Dyslipidemia, goal LDL below 70/CAD S/P PCI/History of non-ST elevation myocardial infarction (NSTEMI)//Morbid obesity (HCC)/Coronary artery disease involving native coronary artery of native heart with angina pectoris (HCC)/Aortic atherosclerosis (HCC) Blood pressure is stable. Medications are managed by cardiology-amlodipine, atorvastatin, carvedilol, Imdur, nitro, Xarelto  Chronic bronchitis, unspecified chronic bronchitis type (HCC)/Moderate persistent asthma, unspecified whether complicated/Nocturnal oxygen desaturation/Obstructive sleep apnea (adult)  Patient is having fatigue with reported history of COPD, asthma, OSA and use of oxygen at night.  She has not seen a pulmonologist in many years and would benefit from reevaluation with pulmonology for the above conditions and treatment. - Ambulatory referral to Pulmonology placed 02/2021    No follow-ups on file.  Orders Placed This Encounter  Procedures   Flu Vaccine QUAD High Dose(Fluad)   B12   Vitamin D (25 hydroxy)     Meds ordered this encounter  Medications   amitriptyline (ELAVIL) 25 MG tablet    Sig: Take 1 tablet (25 mg total) by mouth at bedtime.    Dispense:  90 tablet    Refill:  1   diphenoxylate-atropine (LOMOTIL) 2.5-0.025 MG tablet    Sig: Take 1 tablet by mouth 4 (four) times daily as needed for  diarrhea or loose stools.    Dispense:  30 tablet    Refill:  2     Referral Orders  No referral(s) requested today     > 60 Minutes was dedicated to this patient's encounter to include review of chart, face-to-face time with patient and post-visit work- which include documentation and prescribing medications and/or ordering test when necessary.    Note is dictated utilizing voice recognition software. Although note has been proof read prior to signing, occasional typographical errors still can be missed. If any questions arise, please do not hesitate to call for verification.  Electronically signed by: Felix Pacini, DO [facility] [place]

## 2021-05-09 ENCOUNTER — Other Ambulatory Visit: Payer: Self-pay | Admitting: Family Medicine

## 2021-05-09 MED ORDER — VITAMIN D3 50 MCG (2000 UT) PO CAPS: 4000.0000 [IU] | ORAL_CAPSULE | Freq: Every day | ORAL | 3 refills | Status: DC

## 2021-05-14 ENCOUNTER — Telehealth: Payer: Self-pay | Admitting: *Deleted

## 2021-05-14 NOTE — Telephone Encounter (Signed)
   [facility] Pre-operative Risk Assessment    Patient Name: Mary Ferguson  DOB: [birth year] MRN: [id number]    Request for surgical clearance:  What type of surgery is being performed? Caudal epidural  steroid injection   When is this surgery scheduled? TBD  What type of clearance is required (medical clearance vs. Pharmacy clearance to hold med vs. Both)? both  Are there any medications that need to be held prior to surgery and how long? Xarelto can be held 3 days prior to injection  Practice name and name of physician performing surgery?  [facility] ; Mary Ferguson   What is the office phone number? [phone]   7.   What is the office fax number? [phone]  8.   Anesthesia type (None, local, MAC, general) ? N/A   Mary Ferguson 05/14/2021, 4:33 PM  _________________________________________________________________   (provider comments below)

## 2021-05-15 ENCOUNTER — Encounter: Payer: Self-pay | Admitting: Family Medicine

## 2021-05-15 NOTE — Telephone Encounter (Signed)
Patient with diagnosis of Recurrent DVT on Xarelto for anticoagulation.    Procedure: Caudal epidural  steroid injection  Date of procedure: TBD  CrCl 104 mL/min Platelet count 291K  Per office protocol, patient can hold Xarelto for 3  days prior to procedure.

## 2021-05-16 NOTE — Telephone Encounter (Addendum)
   Name: Mary Ferguson  DOB: [birth year]  MRN: [id number]   Primary Cardiologist: Dr. Royann Shivers  Chart reviewed as part of pre-operative protocol coverage. Patient was contacted 05/16/2021 in reference to pre-operative risk assessment for pending surgery as outlined below.  Mary Ferguson was last seen on 02/2021 by Azalee Course. Historically it appears she has multiple complaints. She was felt to be clinically stable at that visit. I spoke with the patient by phone who affirms she is feeling at her baseline without any accelerating CP, SOB, palpitations, syncope, or concerning cardiac symptoms. Therefore, based on ACC/AHA guidelines, the patient would be at acceptable risk for the planned procedure without further cardiovascular testing. The patient was advised that if she develops new symptoms prior to surgery to contact our office to arrange for a follow-up visit, and she verbalized understanding.  Regarding anticoagulation, she has history of recurrent DVTs - per pharmD review, "Per office protocol, patient can hold Xarelto for 3  days prior to procedure."   The patient reports on the phone she is very hesitant about having to hold her blood thinner or have this procedure at all due to risk of clotting versus expected risk of benefit. She had questions about duration of relief of symptoms versus risks of coming off anticoagulation. We did discuss she would be at small increased risk of recurrent DVT off anticoagulation, but I recommended she reach out to the physician recommending this procedure so she can discuss risks/benefits with them. She also inquired about other options like stimulator implants and I told her I cannot speak to that as that is out of my expertise.  I will route this message to callback team to clarify fax # and please route this message to requesting team. (Fax # has area code that belongs to [place] so need to ensure this is correct). Will remove from preop pool.  [name],  PA-C 05/16/2021, 3:26 PM

## 2021-05-16 NOTE — Telephone Encounter (Signed)
Routed clearance to correct fax # [phone]

## 2021-05-24 ENCOUNTER — Other Ambulatory Visit: Payer: Self-pay | Admitting: Cardiovascular Disease

## 2021-05-24 NOTE — Telephone Encounter (Signed)
Prescription refill request for Xarelto received.  Indication: dvt Last office visit: 02/28/2021, Lisabeth Devoid Weight: 118.8 kg  Age: 70 yo  Scr: 0.95, 01/17/2021 CrCl: 104 ml/min   Per office note from 02/28/2021, pt prefers to be on the highest dose of Xarelto.

## 2021-06-01 ENCOUNTER — Other Ambulatory Visit: Payer: Self-pay

## 2021-06-01 ENCOUNTER — Ambulatory Visit (INDEPENDENT_AMBULATORY_CARE_PROVIDER_SITE_OTHER): Payer: Medicare Other | Admitting: Pulmonary Disease

## 2021-06-01 VITALS — BP 150/70 | HR 81 | Temp 99.1°F | Ht 63.0 in | Wt 263.0 lb

## 2021-06-01 DIAGNOSIS — G4733 Obstructive sleep apnea (adult) (pediatric): Secondary | ICD-10-CM

## 2021-06-01 MED ORDER — IPRATROPIUM-ALBUTEROL 0.5-2.5 (3) MG/3ML IN SOLN: 3.0000 mL | RESPIRATORY_TRACT | 3 refills | Status: DC | PRN
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
## 2021-06-01 NOTE — Patient Instructions (Signed)
History of obstructive sleep apnea  -We will repeat your sleep study to assess severity of sleep apnea and treatment options  History of asthma/COPD  -We will schedule you for breathing study  -Prescription for nebulizer and nebulizer medications to be used up to 4 times a day as needed  -Graded exercises/activity as tolerated  -I will follow-up with you in 6 to 8 weeks, preferably after you have had your sleep study done  Call with significant concerns

## 2021-06-03 ENCOUNTER — Encounter: Payer: Self-pay | Admitting: Pulmonary Disease

## 2021-06-21 DIAGNOSIS — K580 Irritable bowel syndrome with diarrhea: Secondary | ICD-10-CM | POA: Insufficient documentation

## 2021-06-25 ENCOUNTER — Other Ambulatory Visit: Payer: Self-pay

## 2021-06-25 ENCOUNTER — Encounter: Payer: Self-pay | Admitting: Physician Assistant

## 2021-06-25 ENCOUNTER — Ambulatory Visit (INDEPENDENT_AMBULATORY_CARE_PROVIDER_SITE_OTHER): Payer: Medicare Other | Admitting: Physician Assistant

## 2021-06-25 VITALS — BP 112/78 | HR 66 | Ht 63.0 in | Wt 260.0 lb

## 2021-06-25 DIAGNOSIS — I10 Essential (primary) hypertension: Secondary | ICD-10-CM | POA: Diagnosis not present

## 2021-06-25 DIAGNOSIS — G4733 Obstructive sleep apnea (adult) (pediatric): Secondary | ICD-10-CM

## 2021-06-25 DIAGNOSIS — E119 Type 2 diabetes mellitus without complications: Secondary | ICD-10-CM | POA: Diagnosis not present

## 2021-06-25 DIAGNOSIS — I2510 Atherosclerotic heart disease of native coronary artery without angina pectoris: Secondary | ICD-10-CM

## 2021-06-25 DIAGNOSIS — E785 Hyperlipidemia, unspecified: Secondary | ICD-10-CM | POA: Diagnosis not present

## 2021-06-25 DIAGNOSIS — I25119 Atherosclerotic heart disease of native coronary artery with unspecified angina pectoris: Secondary | ICD-10-CM | POA: Diagnosis not present

## 2021-06-25 DIAGNOSIS — Z9861 Coronary angioplasty status: Secondary | ICD-10-CM

## 2021-06-25 DIAGNOSIS — Z86718 Personal history of other venous thrombosis and embolism: Secondary | ICD-10-CM
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
## 2021-06-25 NOTE — Patient Instructions (Addendum)
Medication Instructions:  The current medical regimen is effective;  continue present plan and medications as directed. Please refer to the Current Medication list given to you today.   *If you need a refill on your cardiac medications before your next appointment, please call your pharmacy*  Lab Work:   Testing/Procedures:  NONE    NONE  Follow-Up: Your next appointment:  3 month(s) In Person with You may see Thurmon Fair, MD,Hao Lisabeth Devoid, PA-C or one of the following Advanced Practice Providers on your designated Care Team:  Micah Flesher, [place] or Judy Pimple, PA-C   At [facility], you and your health needs are our priority.  As part of our continuing mission to provide you with exceptional heart care, we have created designated Provider Care Teams.  These Care Teams include your primary Cardiologist (physician) and Advanced Practice Providers (APPs -  Physician Assistants and Nurse Practitioners) who all work together to provide you with the care you need, when you need it.  We recommend signing up for the patient portal called "MyChart".  Sign up information is provided on this After Visit Summary.  MyChart is used to connect with patients for Virtual Visits (Telemedicine).  Patients are able to view lab/test results, encounter notes, upcoming appointments, etc.  Non-urgent messages can be sent to your provider as well.   To learn more about what you can do with MyChart, go to [email or web address].

## 2021-06-27 ENCOUNTER — Encounter: Payer: Self-pay | Admitting: Physician Assistant

## 2021-07-02 ENCOUNTER — Ambulatory Visit: Payer: Medicare Other | Admitting: Family Medicine

## 2021-07-06 ENCOUNTER — Ambulatory Visit: Payer: Medicare Other | Admitting: Cardiovascular Disease

## 2021-07-16 ENCOUNTER — Other Ambulatory Visit: Payer: Self-pay

## 2021-07-16 ENCOUNTER — Encounter: Payer: Self-pay | Admitting: Family

## 2021-07-16 ENCOUNTER — Ambulatory Visit (INDEPENDENT_AMBULATORY_CARE_PROVIDER_SITE_OTHER): Payer: Medicare Other | Admitting: Family

## 2021-07-16 VITALS — BP 118/90 | HR 76 | Temp 97.7°F | Ht 63.0 in | Wt 259.0 lb

## 2021-07-16 DIAGNOSIS — I2510 Atherosclerotic heart disease of native coronary artery without angina pectoris: Secondary | ICD-10-CM

## 2021-07-16 DIAGNOSIS — Z9861 Coronary angioplasty status: Secondary | ICD-10-CM | POA: Diagnosis not present

## 2021-07-16 DIAGNOSIS — J209 Acute bronchitis, unspecified: Secondary | ICD-10-CM | POA: Diagnosis not present

## 2021-07-16 DIAGNOSIS — R062 Wheezing: Secondary | ICD-10-CM

## 2021-07-16 DIAGNOSIS — J111 Influenza due to unidentified influenza virus with other respiratory manifestations: Secondary | ICD-10-CM | POA: Diagnosis not present

## 2021-07-16 LAB — POCT INFLUENZA A/B
Influenza A, POC: NEGATIVE
Influenza B, POC: NEGATIVE

## 2021-07-16 MED ORDER — PREDNISONE 10 MG (21) PO TBPK: ORAL_TABLET | ORAL | 0 refills | Status: DC

## 2021-07-16 MED ORDER — BENZONATATE 200 MG PO CAPS: 200.0000 mg | ORAL_CAPSULE | Freq: Two times a day (BID) | ORAL | 0 refills | Status: DC | PRN
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
## 2021-07-18 LAB — SARS-COV-2, NAA 2 DAY TAT

## 2021-07-18 LAB — NOVEL CORONAVIRUS, NAA: SARS-CoV-2, NAA: NOT DETECTED

## 2021-07-20 ENCOUNTER — Other Ambulatory Visit: Payer: Self-pay

## 2021-07-20 ENCOUNTER — Ambulatory Visit (HOSPITAL_BASED_OUTPATIENT_CLINIC_OR_DEPARTMENT_OTHER): Payer: Medicare Other | Attending: Pulmonary Disease | Admitting: Pulmonary Disease

## 2021-07-20 DIAGNOSIS — G4733 Obstructive sleep apnea (adult) (pediatric): Secondary | ICD-10-CM | POA: Diagnosis not present

## 2021-07-20 DIAGNOSIS — R0683 Snoring: Secondary | ICD-10-CM | POA: Diagnosis not present

## 2021-07-30 ENCOUNTER — Other Ambulatory Visit: Payer: Self-pay | Admitting: Cardiovascular Disease

## 2021-08-02 ENCOUNTER — Telehealth: Payer: Self-pay | Admitting: Pulmonary Disease

## 2021-08-02 NOTE — Procedures (Signed)
POLYSOMNOGRAPHY  Last, First: Mary, Ferguson MRN: [id number] Gender: Female Age (years): 59 Weight (lbs): 261 DOB: June 04, 1951 BMI: 46 Primary Care: No PCP Epworth Score: 2 Referring: Tomma Lightning MD Technician: Rolene Arbour Interpreting: Tomma Lightning MD Study Type: NPSG Ordered Study Type: Split Night CPAP Study date: 07/20/2021 Location: [place] CLINICAL INFORMATION Mary Ferguson is a 70 year old Female and was referred to the sleep center for evaluation of N/A. Indications include OSA.  MEDICATIONS Patient self administered medications include: AMITRIPTYLINE, ATORVASTATIN, CARVEDILOL, FAMOTIDINE, xarelto, generic for lyrica. Medications administered during study include No sleep medicine administered.  SLEEP STUDY TECHNIQUE A multi-channel overnight Polysomnography study was performed. The channels recorded and monitored were central and occipital EEG, electrooculogram (EOG), submentalis EMG (chin), nasal and oral airflow, thoracic and abdominal wall motion, anterior tibialis EMG, snore microphone, electrocardiogram, and a pulse oximetry. TECHNICIAN COMMENTS Comments added by Technician: Patient had difficulty initiating sleep. Comments added by Scorer: N/A SLEEP ARCHITECTURE The study was initiated at 11:07:49 PM and terminated at 5:16:05 AM. The total recorded time was 368.3 minutes. EEG confirmed total sleep time was 186 minutes yielding a sleep efficiency of 50.5%%. Sleep onset after lights out was 81.4 minutes with a REM latency of N/A minutes. The patient spent 4.0%% of the night in stage N1 sleep, 96.0%% in stage N2 sleep, 0.0%% in stage N3 and 0% in REM. Wake after sleep onset (WASO) was 100.9 minutes. The Arousal Index was 13.9/hour. RESPIRATORY PARAMETERS There were a total of 14 respiratory disturbances out of which 0 were apneas ( 0 obstructive, 0 mixed, 0 central) and 14 hypopneas. The apnea/hypopnea index (AHI) was 4.5 events/hour. The central sleep apnea  index was 0 events/hour. The REM AHI was N/A events/hour and NREM AHI was 4.5 events/hour. The supine AHI was 6.4 events/hour and the non supine AHI was 2.2 supine during 55.64% of sleep. Respiratory disturbances were associated with oxygen desaturation down to a nadir of 86.0% during sleep. The mean oxygen saturation during the study was 90.1%. The cumulative time under 88% oxygen saturation was 5.5 minutes.  LEG MOVEMENT DATA The total leg movements were 0 with a resulting leg movement index of 0.0/hr .Associated arousal with leg movement index was 0.0/hr.  CARDIAC DATA The underlying cardiac rhythm was most consistent with sinus rhythm. Mean heart rate during sleep was 61.6 bpm. Additional rhythm abnormalities include None. IMPRESSIONS - No Significant Obstructive Sleep apnea(OSA) - EKG showed no cardiac abnormalities. - No significant oxygen desaturation - The patient snored with moderate snoring volume. - No significant periodic leg movements(PLMs) during sleep. However, no significant associated arousals.  DIAGNOSIS - Sleep onset and maintenance insomnia. - Upper airway resistance  RECOMMENDATIONS - Patient denies significant daytime sleepiness,focus on weight loss efforts, efforts to reduce snoring and improve quality of sleep - Avoid supine sleep, elevation of the head of the bed may help - Avoid alcohol, sedatives and other CNS depressants that may worsen sleep apnea and disrupt normal sleep architecture. - Sleep hygiene should be reviewed to assess factors that may improve sleep quality. - Weight management and regular exercise should be initiated or continued.  [Electronically signed] 08/02/2021 06:27 AM  Virl Diamond MD NPI: [id number]

## 2021-08-02 NOTE — Telephone Encounter (Signed)
I left a message to call back for results.  

## 2021-08-02 NOTE — Telephone Encounter (Signed)
Call patient  Sleep study result  Date of study: 07/20/2021  Impression: Negative for significant sleep disordered breathing Snoring, upper airway resistance  Recommendation:  Focus on weight loss efforts, efforts to reduce snoring  Elevation of the head of the bed by about 30 degrees, avoid supine sleep  Aggressive weight loss measures-weight loss will help reduce snoring and improve quality of sleep  Follow-up as previously scheduled

## 2021-08-03 ENCOUNTER — Ambulatory Visit (INDEPENDENT_AMBULATORY_CARE_PROVIDER_SITE_OTHER): Payer: Medicare Other | Admitting: Pulmonary Disease

## 2021-08-03 ENCOUNTER — Encounter: Payer: Self-pay | Admitting: Pulmonary Disease

## 2021-08-03 ENCOUNTER — Other Ambulatory Visit: Payer: Self-pay

## 2021-08-03 VITALS — BP 116/80 | HR 95 | Temp 97.7°F | Ht 61.0 in | Wt 261.0 lb

## 2021-08-03 DIAGNOSIS — G4733 Obstructive sleep apnea (adult) (pediatric): Secondary | ICD-10-CM

## 2021-08-03 LAB — PULMONARY FUNCTION TEST
DL/VA % pred: 98 %
DL/VA: 4.19 ml/min/mmHg/L
DLCO cor % pred: 92 %
DLCO cor: 16.27 ml/min/mmHg
DLCO unc % pred: 92 %
DLCO unc: 16.27 ml/min/mmHg
FEF 25-75 Post: 1.46 L/sec
FEF 25-75 Pre: 2.3 L/sec
FEF2575-%Change-Post: -36 %
FEF2575-%Pred-Post: 82 %
FEF2575-%Pred-Pre: 129 %
FEV1-%Change-Post: -8 %
FEV1-%Pred-Post: 82 %
FEV1-%Pred-Pre: 90 %
FEV1-Post: 1.67 L
FEV1-Pre: 1.82 L
FEV1FVC-%Change-Post: 1 %
FEV1FVC-%Pred-Pre: 110 %
FEV6-%Change-Post: -9 %
FEV6-%Pred-Post: 77 %
FEV6-%Pred-Pre: 85 %
FEV6-Post: 1.97 L
FEV6-Pre: 2.17 L
FEV6FVC-%Pred-Post: 104 %
FEV6FVC-%Pred-Pre: 104 %
FVC-%Change-Post: -9 %
FVC-%Pred-Post: 73 %
FVC-%Pred-Pre: 81 %
FVC-Post: 1.97 L
FVC-Pre: 2.17 L
Post FEV1/FVC ratio: 85 %
Post FEV6/FVC ratio: 100 %
Pre FEV1/FVC ratio: 84 %
Pre FEV6/FVC Ratio: 100 %
RV % pred: 108 %
RV: 2.19 L
TLC % pred: 96 %
TLC: 4.42 L

## 2021-08-03 MED ORDER — ALBUTEROL SULFATE HFA 108 (90 BASE) MCG/ACT IN AERS: 2.0000 | INHALATION_SPRAY | RESPIRATORY_TRACT | 3 refills | Status: DC | PRN

## 2021-08-03 MED ORDER — BUDESONIDE-FORMOTEROL FUMARATE 80-4.5 MCG/ACT IN AERO: 2.0000 | INHALATION_SPRAY | Freq: Two times a day (BID) | RESPIRATORY_TRACT | 3 refills | Status: DC

## 2021-08-03 MED ORDER — SPACER/AERO-HOLDING CHAMBERS DEVI: 0 refills | Status: DC

## 2021-08-03 NOTE — Progress Notes (Signed)
PFT done today. 

## 2021-08-03 NOTE — Patient Instructions (Signed)
Prescription for albuterol sent to pharmacy -May be used in place of the nebulizer -Up to 4 times a day as needed  Symbicort -2 puffs twice a day -Always remember to rinse your mouth after use  Graded activity as tolerated  Follow-up in 3 months
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
## 2021-08-30 ENCOUNTER — Other Ambulatory Visit: Payer: Self-pay | Admitting: Cardiovascular Disease

## 2021-08-30 ENCOUNTER — Other Ambulatory Visit: Payer: Self-pay | Admitting: Physician Assistant

## 2021-08-30 DIAGNOSIS — R002 Palpitations: Secondary | ICD-10-CM

## 2021-08-30 NOTE — Telephone Encounter (Signed)
Prescription refill request for Xarelto received.  Indication:DVT Last office visit:10/22 Weight:118.4 kg Age:71 Scr:0.9 CrCl:110.27 ml/min  Prescription refilled

## 2021-09-21 ENCOUNTER — Ambulatory Visit: Payer: Medicare Other | Admitting: Adult Health

## 2021-10-02 ENCOUNTER — Other Ambulatory Visit: Payer: Self-pay

## 2021-10-02 MED ORDER — FAMOTIDINE 40 MG PO TABS: 40.0000 mg | ORAL_TABLET | Freq: Every day | ORAL | 0 refills | Status: DC

## 2021-10-09 ENCOUNTER — Ambulatory Visit (INDEPENDENT_AMBULATORY_CARE_PROVIDER_SITE_OTHER): Payer: Medicare Other | Admitting: Physician Assistant

## 2021-10-09 ENCOUNTER — Other Ambulatory Visit: Payer: Self-pay

## 2021-10-09 ENCOUNTER — Encounter: Payer: Self-pay | Admitting: Physician Assistant

## 2021-10-09 VITALS — BP 122/84 | HR 87 | Ht 63.0 in | Wt 261.2 lb

## 2021-10-09 DIAGNOSIS — E785 Hyperlipidemia, unspecified: Secondary | ICD-10-CM

## 2021-10-09 DIAGNOSIS — I10 Essential (primary) hypertension: Secondary | ICD-10-CM

## 2021-10-09 DIAGNOSIS — I209 Angina pectoris, unspecified: Secondary | ICD-10-CM

## 2021-10-09 DIAGNOSIS — I2510 Atherosclerotic heart disease of native coronary artery without angina pectoris: Secondary | ICD-10-CM

## 2021-10-09 DIAGNOSIS — Z86718 Personal history of other venous thrombosis and embolism: Secondary | ICD-10-CM

## 2021-10-09 DIAGNOSIS — R002 Palpitations: Secondary | ICD-10-CM

## 2021-10-09 DIAGNOSIS — E119 Type 2 diabetes mellitus without complications: Secondary | ICD-10-CM

## 2021-10-09 DIAGNOSIS — I25119 Atherosclerotic heart disease of native coronary artery with unspecified angina pectoris: Secondary | ICD-10-CM

## 2021-10-09 MED ORDER — AMLODIPINE BESYLATE 2.5 MG PO TABS: 2.5000 mg | ORAL_TABLET | Freq: Every day | ORAL | 3 refills | Status: DC

## 2021-10-09 MED ORDER — ISOSORBIDE MONONITRATE ER 60 MG PO TB24: ORAL_TABLET | ORAL | 3 refills | Status: DC

## 2021-10-09 MED ORDER — AMLODIPINE BESYLATE 2.5 MG PO TABS: ORAL_TABLET | ORAL | 3 refills | Status: DC

## 2021-10-09 MED ORDER — ATORVASTATIN CALCIUM 80 MG PO TABS: 80.0000 mg | ORAL_TABLET | Freq: Every evening | ORAL | 3 refills | Status: DC

## 2021-10-09 MED ORDER — CARVEDILOL 6.25 MG PO TABS: ORAL_TABLET | ORAL | 3 refills | Status: DC

## 2021-10-09 NOTE — Patient Instructions (Signed)
Medication Instructions:  NONE ordered at this time of appointment   *If you need a refill on your cardiac medications before your next appointment, please call your pharmacy*   Lab Work: Your physician recommends that you return for Fasting lab work in May 2023. CMP CBC Lipid  If you have labs (blood work) drawn today and your tests are completely normal, you will receive your results only by: MyChart Message (if you have MyChart) OR A paper copy in the mail If you have any lab test that is abnormal or we need to change your treatment, we will call you to review the results.   Testing/Procedures: NONE ordered at this time of appointment     Follow-Up: At [facility], you and your health needs are our priority.  As part of our continuing mission to provide you with exceptional heart care, we have created designated Provider Care Teams.  These Care Teams include your primary Cardiologist (physician) and Advanced Practice Providers (APPs -  Physician Assistants and Nurse Practitioners) who all work together to provide you with the care you need, when you need it.  We recommend signing up for the patient portal called "MyChart".  Sign up information is provided on this After Visit Summary.  MyChart is used to connect with patients for Virtual Visits (Telemedicine).  Patients are able to view lab/test results, encounter notes, upcoming appointments, etc.  Non-urgent messages can be sent to your provider as well.   To learn more about what you can do with MyChart, go to [email or web address].    Your next appointment:   4-5 month(s)  The format for your next appointment:   In Person  Provider:   Thurmon Fair, MD     Other Instructions NONE ordered at this time of appointment
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
## 2021-10-11 ENCOUNTER — Encounter: Payer: Self-pay | Admitting: Physician Assistant

## 2021-10-19 ENCOUNTER — Ambulatory Visit (INDEPENDENT_AMBULATORY_CARE_PROVIDER_SITE_OTHER): Payer: Medicare Other | Admitting: Family Medicine

## 2021-10-19 ENCOUNTER — Encounter: Payer: Self-pay | Admitting: Family Medicine

## 2021-10-19 ENCOUNTER — Other Ambulatory Visit: Payer: Self-pay

## 2021-10-19 VITALS — BP 119/80 | HR 95 | Temp 98.1°F | Ht 63.0 in | Wt 262.0 lb

## 2021-10-19 DIAGNOSIS — E785 Hyperlipidemia, unspecified: Secondary | ICD-10-CM

## 2021-10-19 DIAGNOSIS — Z86718 Personal history of other venous thrombosis and embolism: Secondary | ICD-10-CM

## 2021-10-19 DIAGNOSIS — I25119 Atherosclerotic heart disease of native coronary artery with unspecified angina pectoris: Secondary | ICD-10-CM

## 2021-10-19 DIAGNOSIS — J42 Unspecified chronic bronchitis: Secondary | ICD-10-CM

## 2021-10-19 DIAGNOSIS — D6869 Other thrombophilia: Secondary | ICD-10-CM

## 2021-10-19 DIAGNOSIS — K580 Irritable bowel syndrome with diarrhea: Secondary | ICD-10-CM

## 2021-10-19 DIAGNOSIS — I2510 Atherosclerotic heart disease of native coronary artery without angina pectoris: Secondary | ICD-10-CM

## 2021-10-19 DIAGNOSIS — F330 Major depressive disorder, recurrent, mild: Secondary | ICD-10-CM

## 2021-10-19 DIAGNOSIS — I700 Atherosclerosis of aorta: Secondary | ICD-10-CM

## 2021-10-19 DIAGNOSIS — E118 Type 2 diabetes mellitus with unspecified complications: Secondary | ICD-10-CM

## 2021-10-19 DIAGNOSIS — I10 Essential (primary) hypertension: Secondary | ICD-10-CM | POA: Diagnosis not present

## 2021-10-19 DIAGNOSIS — I252 Old myocardial infarction: Secondary | ICD-10-CM

## 2021-10-19 DIAGNOSIS — L819 Disorder of pigmentation, unspecified: Secondary | ICD-10-CM

## 2021-10-19 DIAGNOSIS — G629 Polyneuropathy, unspecified: Secondary | ICD-10-CM

## 2021-10-19 DIAGNOSIS — N1831 Chronic kidney disease, stage 3a: Secondary | ICD-10-CM

## 2021-10-19 DIAGNOSIS — Z6841 Body Mass Index (BMI) 40.0 and over, adult: Secondary | ICD-10-CM

## 2021-10-19 DIAGNOSIS — Z9861 Coronary angioplasty status: Secondary | ICD-10-CM

## 2021-10-19 DIAGNOSIS — R202 Paresthesia of skin: Secondary | ICD-10-CM

## 2021-10-19 DIAGNOSIS — E559 Vitamin D deficiency, unspecified: Secondary | ICD-10-CM

## 2021-10-19 LAB — POCT GLYCOSYLATED HEMOGLOBIN (HGB A1C)
HbA1c POC (<> result, manual entry): 7.2 % (ref 4.0–5.6)
HbA1c, POC (controlled diabetic range): 7.2 % — AB (ref 0.0–7.0)
HbA1c, POC (prediabetic range): 7.2 % — AB (ref 5.7–6.4)
Hemoglobin A1C: 7.2 % — AB (ref 4.0–5.6)

## 2021-10-19 MED ORDER — AMITRIPTYLINE HCL 50 MG PO TABS: 50.0000 mg | ORAL_TABLET | Freq: Every day | ORAL | 1 refills | Status: DC

## 2021-10-19 MED ORDER — DAPAGLIFLOZIN PROPANEDIOL 5 MG PO TABS: 5.0000 mg | ORAL_TABLET | Freq: Every day | ORAL | 5 refills | Status: DC

## 2021-10-19 MED ORDER — FAMOTIDINE 40 MG PO TABS: 40.0000 mg | ORAL_TABLET | Freq: Every day | ORAL | 1 refills | Status: DC

## 2021-10-19 MED ORDER — HYDROXYZINE HCL 25 MG PO TABS: 25.0000 mg | ORAL_TABLET | Freq: Three times a day (TID) | ORAL | 1 refills | Status: DC | PRN

## 2021-10-19 NOTE — Progress Notes (Signed)
Patient ID: Mary Ferguson, female  DOB: [birth year], 71 y.o.   MRN: [id number] Patient Care Team    Relationship Specialty Notifications Start End  Ma Hillock, DO PCP - General Family Medicine  03/21/21   Sanda Klein, MD PCP - Cardiology Cardiology  06/25/21   Sanda Klein, MD Consulting Physician Cardiology  03/21/21   Monna Fam, MD Consulting Physician Ophthalmology  03/21/21   Remo Lipps, MD Referring Physician Surgery  03/21/21   Boyd Kerbs, MD Referring Physician Neurology  03/21/21   Troy Sine, MD Consulting Physician Cardiology  03/21/21   Cassell Clement., MD Physician Assistant Sports Medicine  03/21/21   Rachelle Hora, MD Referring Physician Dermatology  03/21/21   Lourdes Sledge, MD Referring Physician Pulmonary Disease  03/21/21   Ronnette Juniper, MD Consulting Physician Gastroenterology  03/21/21     Chief Complaint  Patient presents with   Diabetes    [facility]; pt is fasting    Subjective: Mary Ferguson is a 71 y.o.  female present for follow-up on new med start Chronic generalized abdominal pain/Chronic pain syndrome/Neuropathy/Paresthesia of bilateral legs/Compression fracture of L1 vertebra, initial encounter (HCC)/ Lumbar radiculopathy Patient has noticed an improvement in her neuropathy with amitriptyline 25 mg nightly, but notes she still has significant discomfort in her legs, especially when laying flat.  She denies oversedation with the amitriptyline 25 mg nightly. He does notice swelling in her feet and discoloration.  She has multiple varicosities. Prior note: Patient has an extensive chronic pain history.  Per her report she has been on very strong high-dose narcotics in the past, and her pain persisted.  She reports a strong family history of substance abuse, and is not interested in narcotics.  She currently is not on any type of medications to help with her discomfort.    B12 deficiency and Vitamin D deficiency She reports  compliance with B12 and vitamin D supplementation.  She has also started the 4000 units of vitamin D daily.  She has noticed a little increase in energy, but not as much as she would hope.  Type II diabetes mellitus with manifestations [facility]) Patient reports she has a history of diabetes, but has never needed to start medication.  A1c's have been no higher than 6.6 per EMR review in the past. Patient denies dizziness, hyperglycemic or hypoglycemic events. Patient denies nonhealing wounds of feet.  She has chronic neuropathy.   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  She is on lifelong anticoagulation therapy.  Uncertain hematological work-up in the past.  Essential hypertension/Dyslipidemia, goal LDL below 70/CAD S/P PCI/History of non-ST elevation myocardial infarction (NSTEMI)//Morbid obesity (HCC)/Coronary artery disease involving native coronary artery of native heart with angina pectoris (HCC)/Aortic atherosclerosis ([place]) Managed by cardiology   Chronic bronchitis, unspecified chronic bronchitis type (HCC)/Moderate persistent asthma, unspecified whether complicated/Nocturnal oxygen desaturation/Obstructive sleep apnea (adult)  Managed by pulmonology  No flowsheet data found. No flowsheet data found.      Fall Risk  10/19/2021 03/21/2021  Falls in the past year? 1 1  Number falls in past yr: 1 1  Injury with Fall? 0 0  Risk for fall due to : History of fall(s) History of fall(s)  Follow up Falls evaluation completed Falls evaluation completed   Immunization History  Administered Date(s) Administered   Fluad Quad(high Dose 65+) 05/08/2021   Influenza, Seasonal, Injecte, Preservative Fre 06/22/2015   Influenza,inj,quad, With Preservative  07/05/2016   Influenza-Unspecified 05/10/2013, 05/05/2014   MODERNA SARS COV-2 Pediatric Vaccination 73mos to <5yrs 08/12/2020, 04/02/2021   Moderna  Sars-Covid-2 Vaccination 11/04/2019, 12/02/2019   Pneumococcal Conjugate-13 08/26/2004    No results found.  Past Medical History:  Diagnosis Date   Acute and chronic respiratory failure with hypoxia (HCC) 01/24/2018   Asthma    Blood in stool    Chicken pox    Chronic generalized abdominal pain    Chronic pain    CKD (chronic kidney disease), stage III (HCC)    Colon polyps    COPD (chronic obstructive pulmonary disease) (HCC)    COVID-19 2020   Depression    Diverticulitis    Very severe.  Has had 2 bowel resections and temporary colostomy in the past.   Fecal incontinence    Frequent headaches    GERD (gastroesophageal reflux disease)    Heart attack (HCC)    Heart disease    History of blood transfusion    Hyperlipemia    Hypertension    Morbid obesity (HCC)    Pneumonia due to infectious organism 01/12/2018   Postmenopausal 03/21/2021   PTSD (post-traumatic stress disorder)    Recurrent deep vein thrombosis (DVT) (HCC)    x6   Urinary incontinence    Allergies  Allergen Reactions   Rofecoxib Anaphylaxis    VIOXX   Tetracyclines & Related Nausea And Vomiting   Benadryl Allergy [Diphenhydramine Hcl] Other (See Comments)    Depressed respirations   Cephalexin Rash   Gatifloxacin Other (See Comments)    Confusion Very low blood pressure [place]inn   Past Surgical History:  Procedure Laterality Date   ABDOMINAL HYSTERECTOMY  1995   APPENDECTOMY     CATARACT EXTRACTION Bilateral    CESAREAN SECTION  1984   CHOLECYSTECTOMY  2016   COLOSTOMY     and reversal   CORONARY STENT INTERVENTION N/A 06/18/2019   Procedure: CORONARY STENT INTERVENTION;  Surgeon: Iran Ouch, MD;  Location: [facility];  Service: Cardiovascular;  Laterality: N/A;   ENDOMETRIAL ABLATION     HERNIA REPAIR  1985   1985 and 2016 (2016 ventral w/ incarceration) 33 surgeries   HYSTEROSCOPY WITH D & C     13   LAPAROSCOPIC LYSIS OF ADHESIONS     x2   LEFT HEART CATH AND  CORONARY ANGIOGRAPHY N/A 06/18/2019   Procedure: LEFT HEART CATH AND CORONARY ANGIOGRAPHY;  Surgeon: Iran Ouch, MD;  Location: [facility];  Service: Cardiovascular;  Laterality: N/A;   LEFT HEART CATH AND CORONARY ANGIOGRAPHY N/A 11/30/2019   Procedure: LEFT HEART CATH AND CORONARY ANGIOGRAPHY;  Surgeon: Lennette Bihari, MD;  Location: [facility];  Service: Cardiovascular;  Laterality: N/A;   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   TONSILLECTOMY  1959   VEIN SURGERY     laser x2   Family History  Problem Relation Age of Onset   Depression Mother    Drug abuse Mother    Mental illness Mother    Learning disabilities Mother    Miscarriages / [place] Mother    Alcohol abuse Mother    Mental illness Father    Depression Father    Heart disease Father    Drug abuse Sister    Depression Sister    Asthma Sister    Alcohol abuse Sister    Early death Sister    Mental illness Son    Hypertension Son  Drug abuse Son    Depression Son    Asthma Son    Alcohol abuse Son    Clotting disorder Son    Heart disease Maternal Grandmother    Diabetes Maternal Grandmother    Arthritis Maternal Grandmother    COPD Maternal Grandmother    Parkinson's disease Maternal Grandmother    Heart disease Maternal Grandfather    Heart attack Maternal Grandfather    Early death Maternal Grandfather    COPD Maternal Grandfather    Heart disease Paternal Grandmother    Heart attack Paternal Grandmother    Depression Paternal Grandmother    Arthritis Paternal Grandmother    Breast cancer Paternal Grandmother    Heart disease Paternal Grandfather    Heart attack Paternal Grandfather    Early death Paternal Grandfather    COPD Paternal Grandfather    Mental illness Half-Brother    Learning disabilities Half-Brother    Hypertension Half-Brother    Hyperlipidemia Half-Brother    Drug abuse Half-Brother    Heart disease Half-Brother    Diabetes Half-Brother     Depression Half-Brother    Asthma Half-Brother    Alcohol abuse Half-Brother    Arthritis Half-Brother    Kidney disease Half-Brother    Testicular cancer Half-Brother    Diabetes Half-Sister    Mental illness Half-Sister    Learning disabilities Half-Sister    Drug abuse Half-Sister    Depression Half-Sister    Alcohol abuse Half-Sister    Thyroid cancer Half-Sister    Social History   Social History Narrative   Marital status/children/pets:    Education/employment: retired     - wears seatbelt: Yes     - Feels safe in their relationships: Yes       Allergies as of 10/19/2021       Reactions   Rofecoxib Anaphylaxis   VIOXX   Tetracyclines & Related Nausea And Vomiting   Benadryl Allergy [diphenhydramine Hcl] Other (See Comments)   Depressed respirations   Cephalexin Rash   Gatifloxacin Other (See Comments)   Confusion Very low blood pressure [place]        Medication List        Accurate as of October 19, 2021  4:36 PM. If you have any questions, ask your nurse or doctor.          acetaminophen 500 MG tablet Commonly known as: TYLENOL Take 500 mg by mouth every 6 (six) hours as needed for mild pain or moderate pain.   albuterol 108 (90 Base) MCG/ACT inhaler Commonly known as: VENTOLIN HFA Inhale 2 puffs into the lungs every 4 (four) hours as needed for wheezing or shortness of breath.   amitriptyline 50 MG tablet Commonly known as: ELAVIL Take 1 tablet (50 mg total) by mouth at bedtime. What changed:  medication strength how much to take Changed by: Howard Pouch, DO   amLODipine 2.5 MG tablet Commonly known as: NORVASC Take 1 tablet (2.5 mg total) by mouth daily.   atorvastatin 80 MG tablet Commonly known as: LIPITOR Take 1 tablet (80 mg total) by mouth every evening.   budesonide-formoterol 80-4.5 MCG/ACT inhaler Commonly known as: Symbicort Inhale 2 puffs into the lungs 2 (two) times daily.   carvedilol 6.25 MG tablet Commonly  known as: COREG TAKE 1 TABLET BY MOUTH IN THE MORNING AND 2 TABLETS IN THE EVENING   clobetasol ointment 0.05 % Commonly known as: TEMOVATE Apply 1 application topically daily as needed (Knots on face).   dapagliflozin propanediol  5 MG Tabs tablet Commonly known as: Farxiga Take 1 tablet (5 mg total) by mouth daily before breakfast. Started by: Howard Pouch, DO   dicyclomine 20 MG tablet Commonly known as: BENTYL Take 1 tablet (20 mg total) by mouth 4 (four) times daily as needed for spasms.   diphenoxylate-atropine 2.5-0.025 MG tablet Commonly known as: LOMOTIL Take 1 tablet by mouth 4 (four) times daily as needed for diarrhea or loose stools.   famotidine 40 MG tablet Commonly known as: PEPCID Take 1 tablet (40 mg total) by mouth at bedtime.   fluticasone 27.5 MCG/SPRAY nasal spray Commonly known as: VERAMYST Place into both nostrils as needed.   hydrOXYzine 25 MG tablet Commonly known as: ATARAX Take 1 tablet (25 mg total) by mouth every 8 (eight) hours as needed for itching.   ipratropium-albuterol 0.5-2.5 (3) MG/3ML Soln Commonly known as: DUONEB Take 3 mLs by nebulization every 4 (four) hours as needed.   isosorbide mononitrate 60 MG 24 hr tablet Commonly known as: IMDUR TAKE 1 TABLET(60 MG) BY MOUTH DAILY   nitroGLYCERIN 0.4 MG SL tablet Commonly known as: NITROSTAT PLACE 1 TABLET UNDER THE TONGUE EVERY 5 MINUTES FOR 3 DOSES AS NEEDED FOR CHEST PAIN   psyllium 28 % packet Commonly known as: METAMUCIL SMOOTH TEXTURE Take 1 packet by mouth 3 (three) times daily as needed (constipation). unflavored sugar-free   Spacer/Aero-Holding Dorise Bullion Use with inhaler.   vitamin B-12 500 MCG tablet Commonly known as: CYANOCOBALAMIN Take 500 mcg by mouth daily.   Vitamin D3 50 MCG (2000 UT) capsule Take 2 capsules (4,000 Units total) by mouth daily.   Xarelto 20 MG Tabs tablet Generic drug: rivaroxaban TAKE 1 TABLET(20 MG) BY MOUTH EVERY EVENING         All past medical history, surgical history, allergies, family history, immunizations andmedications were updated in the EMR today and reviewed under the history and medication portions of their EMR.         ROS: 14 pt review of systems performed and negative (unless mentioned in an HPI)  Objective: BP 119/80    Pulse 95    Temp 98.1 F (36.7 C) (Oral)    Ht 5\' 3"  (1.6 m)    Wt 262 lb (118.8 kg)    SpO2 95%    BMI 46.41 kg/m  Physical Exam Vitals and nursing note reviewed.  Constitutional:      General: She is not in acute distress.    Appearance: Normal appearance. She is obese. She is not ill-appearing, toxic-appearing or diaphoretic.  HENT:     Head: Normocephalic and atraumatic.  Eyes:     General: No scleral icterus.       Right eye: No discharge.        Left eye: No discharge.     Extraocular Movements: Extraocular movements intact.     Conjunctiva/sclera: Conjunctivae normal.     Pupils: Pupils are equal, round, and reactive to light.  Cardiovascular:     Rate and Rhythm: Normal rate and regular rhythm.     Heart sounds: No murmur heard.   No friction rub. No gallop.  Pulmonary:     Effort: Pulmonary effort is normal. No respiratory distress.     Breath sounds: Normal breath sounds. No wheezing, rhonchi or rales.  Musculoskeletal:        General: Swelling present.     Cervical back: Neck supple. No tenderness.     Right lower leg: No edema.  Left lower leg: No edema.  Lymphadenopathy:     Cervical: No cervical adenopathy.  Skin:    General: Skin is warm and dry.     Capillary Refill: Capillary refill takes more than 3 seconds.     Coloration: Skin is not jaundiced or pale.     Findings: No erythema or rash.     Comments: Lower extremity varicosities veins.  Discoloration bilateral lower extremity.  Neurological:     Mental Status: She is alert and oriented to person, place, and time. Mental status is at baseline.     Sensory: Sensory deficit present.      Motor: No weakness.     Gait: Gait normal.  Psychiatric:        Mood and Affect: Mood normal.        Behavior: Behavior normal.        Thought Content: Thought content normal.        Judgment: Judgment normal.      Assessment/plan: Mary Ferguson is a 71 y.o. female present for  Chronic pain syndrome/Neuropathy/Paresthesia of bilateral legs/Compression fracture of L1 vertebra, initial encounter (HCC)/ Lumbar radiculopathy/IBS-like pain -She has reportedly been on many pain medicines in the past and did not find them helpful.  She is not interested in narcotics.  She has history of compression fraction and lumbar radiculopathy (L4-L5 region). I believe her pain is multifactorial.  She recently has evidence of orthopedic causes and her chronic abdominal pain secondary to severe diverticulosis and multiple abdominal surgeries.  There also may be a pain syndrome component given her past history and history of depression. -Consider Cymbalta-may be an option later. -Consider Lyrica-also may be an option later.   -increase Elavil 50 mg nightly.  She understands not stop abruptly because she does not think it is working.  Hopefully this will help with neuropathic pain, depression and she may benefit with GI complaints. -NSAIDs contraindicated with her cardiac history and Xarelto use. -She is currently being managed with lower back pain by Ortho team with injections. -Referral to vascular and vein for eval with history of worsening paresthesias when laying flat and decreased cap refill/discoloration of lower extremities.  B12 deficiency and Vitamin D deficiency Continue oral supplements and doses will be adjusted after results.  -Vitamin D collected today.  Type II diabetes mellitus with manifestations (HCC) A1c increased to 7.2 today. Discussed Wilder Glade benefits and she is agreeable to start. Farxiga 5 mg daily prescribed. Patient is to focus on her diet,.  Follow a diabetic diet. Foot exam  completed today. Pneumonia vaccination will be offered next visit.  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 CBC and BMP collected today  Essential hypertension/Dyslipidemia, goal LDL below 70/CAD S/P PCI/History of non-ST elevation myocardial infarction (NSTEMI)//Morbid obesity (HCC)/Coronary artery disease involving native coronary artery of native heart with angina pectoris (HCC)/Aortic atherosclerosis (HCC) Blood pressure is stable. Medications are managed by cardiology-amlodipine, atorvastatin, carvedilol, Imdur, nitro, Xarelto  Chronic bronchitis, unspecified chronic bronchitis type (HCC)/Moderate persistent asthma, unspecified whether complicated/Nocturnal oxygen desaturation/Obstructive sleep apnea (adult)  Patient is having fatigue with reported history of COPD, asthma, OSA and use of oxygen at night.   - Ambulatory referral to Pulmonology placed 02/2021    No follow-ups on file.  Orders Placed This Encounter  Procedures   Microalbumin / creatinine urine ratio   Basic Metabolic Panel (BMET)   CBC   TSH   Vitamin D (25 hydroxy)   Ambulatory referral to  Vascular Surgery   POCT HgB A1C     Meds ordered this encounter  Medications   amitriptyline (ELAVIL) 50 MG tablet    Sig: Take 1 tablet (50 mg total) by mouth at bedtime.    Dispense:  90 tablet    Refill:  1   famotidine (PEPCID) 40 MG tablet    Sig: Take 1 tablet (40 mg total) by mouth at bedtime.    Dispense:  90 tablet    Refill:  1   hydrOXYzine (ATARAX) 25 MG tablet    Sig: Take 1 tablet (25 mg total) by mouth every 8 (eight) hours as needed for itching.    Dispense:  270 tablet    Refill:  1   dapagliflozin propanediol (FARXIGA) 5 MG TABS tablet    Sig: Take 1 tablet (5 mg total) by mouth daily before breakfast.    Dispense:  30 tablet    Refill:  5     Referral Orders         Ambulatory referral to Vascular Surgery        > 60 Minutes was dedicated to this patient's encounter to include review of chart, face-to-face time with patient and post-visit work- which include documentation and prescribing medications and/or ordering test when necessary.    Note is dictated utilizing voice recognition software. Although note has been proof read prior to signing, occasional typographical errors still can be missed. If any questions arise, please do not hesitate to call for verification.  Electronically signed by: Howard Pouch, DO [place]

## 2021-10-20 LAB — MICROALBUMIN / CREATININE URINE RATIO
Creatinine, Urine: 169 mg/dL (ref 20–275)
Microalb Creat Ratio: 7 mcg/mg creat (ref <30)
Microalb, Ur: 1.2 mg/dL

## 2021-10-20 LAB — BASIC METABOLIC PANEL
BUN/Creatinine Ratio: 16 (calc) (ref 6–22)
BUN: 19 mg/dL (ref 7–25)
CO2: 21 mmol/L (ref 20–32)
Calcium: 10 mg/dL (ref 8.6–10.4)
Chloride: 106 mmol/L (ref 98–110)
Creat: 1.17 mg/dL — ABNORMAL HIGH (ref 0.50–1.05)
Glucose, Bld: 108 mg/dL — ABNORMAL HIGH (ref 65–99)
Potassium: 4.5 mmol/L (ref 3.5–5.3)
Sodium: 141 mmol/L (ref 135–146)

## 2021-10-20 LAB — CBC
HCT: 43.2 % (ref 35.0–45.0)
Hemoglobin: 14 g/dL (ref 11.7–15.5)
MCH: 26.9 pg — ABNORMAL LOW (ref 27.0–33.0)
MCHC: 32.4 g/dL (ref 32.0–36.0)
MCV: 82.9 fL (ref 80.0–100.0)
MPV: 10.3 fL (ref 7.5–12.5)
Platelets: 286 10*3/uL (ref 140–400)
RBC: 5.21 10*6/uL — ABNORMAL HIGH (ref 3.80–5.10)
RDW: 14 % (ref 11.0–15.0)
WBC: 8.8 10*3/uL (ref 3.8–10.8)

## 2021-10-20 LAB — TSH: TSH: 2.1 mIU/L (ref 0.40–4.50)

## 2021-10-20 LAB — VITAMIN D 25 HYDROXY (VIT D DEFICIENCY, FRACTURES): Vit D, 25-Hydroxy: 35 ng/mL (ref 30–100)

## 2021-11-13 NOTE — Telephone Encounter (Signed)
Error

## 2021-11-26 NOTE — Progress Notes (Signed)
?[facility] - Clinic Note ? ?11/28/2021 ? ?  ? ?CHIEF COMPLAINT ?Patient presents for Retina Evaluation ? ? ?HISTORY OF PRESENT ILLNESS: ?Mary Ferguson is a 71 y.o. female who presents to the clinic today for:  ? ?HPI   ? ? Retina Evaluation   ?In both eyes.  This started years ago.  Duration of years.  Associated Symptoms Floaters.  I, the attending physician,  performed the HPI with the patient and updated documentation appropriately. ? ?  ?  ? ? Comments   ?Pt here for diabetic retinal eval, referred by Dr. Felix Pacini. Pt states she was recently diagnosed diabetic and started on medications. Pt was considered pre diabetic for some years. A1C is now at 7.2 as of Feb 2023. Pt states she has experienced floaters OU and halos in [place] since cataract sx about 30 years ago. Vision wise she has had more difficulty w/ NVA, buying stronger readers OTC. She does not wear rx specs.  ? ?  ?  ?Last edited by Rennis Chris, MD on 11/28/2021  4:56 PM.  ?  ?Pt is here on the referral of her PCP, Dr. Felix Pacini, pts A1c is 7.2 on October 19, 2021, pt states she had cataract sx about 30 years ago with Dr. Elmer Picker, she states she has had floaters and halo since then, she states she also had Yag one year after cataract sx, pt is also hypertensive ? ?Referring physician: ?Kuneff, Renee A, DO ?[address] ?[place],  [place] [address] ? ?HISTORICAL INFORMATION:  ? ?Selected notes from the MEDICAL RECORD NUMBER ?Referred by Dr. Claiborne Billings for diabetic eye exam ?LEE:  ?Ocular Hx- ?PMH- ?  ? ?CURRENT MEDICATIONS: ?No current outpatient medications on file. (Ophthalmic Drugs)  ? ?No current facility-administered medications for this visit. (Ophthalmic Drugs)  ? ?Current Outpatient Medications (Other)  ?Medication Sig  ? acetaminophen (TYLENOL) 500 MG tablet Take 500 mg by mouth every 6 (six) hours as needed for mild pain or moderate pain.  ? albuterol (VENTOLIN HFA) 108 (90 Base) MCG/ACT inhaler Inhale 2 puffs into the lungs every 4  (four) hours as needed for wheezing or shortness of breath.  ? amitriptyline (ELAVIL) 50 MG tablet Take 1 tablet (50 mg total) by mouth at bedtime.  ? amLODipine (NORVASC) 2.5 MG tablet Take 1 tablet (2.5 mg total) by mouth daily.  ? atorvastatin (LIPITOR) 80 MG tablet Take 1 tablet (80 mg total) by mouth every evening.  ? budesonide-formoterol (SYMBICORT) 80-4.5 MCG/ACT inhaler Inhale 2 puffs into the lungs 2 (two) times daily.  ? carvedilol (COREG) 6.25 MG tablet TAKE 1 TABLET BY MOUTH IN THE MORNING AND 2 TABLETS IN THE EVENING  ? Cholecalciferol (VITAMIN D3) 50 MCG (2000 UT) capsule Take 2 capsules (4,000 Units total) by mouth daily.  ? clobetasol ointment (TEMOVATE) 0.05 % Apply 1 application topically daily as needed (Knots on face).  ? dapagliflozin propanediol (FARXIGA) 5 MG TABS tablet Take 1 tablet (5 mg total) by mouth daily before breakfast.  ? dicyclomine (BENTYL) 20 MG tablet Take 1 tablet (20 mg total) by mouth 4 (four) times daily as needed for spasms.  ? diphenoxylate-atropine (LOMOTIL) 2.5-0.025 MG tablet Take 1 tablet by mouth 4 (four) times daily as needed for diarrhea or loose stools.  ? famotidine (PEPCID) 40 MG tablet Take 1 tablet (40 mg total) by mouth at bedtime.  ? fluticasone (VERAMYST) 27.5 MCG/SPRAY nasal spray Place into both nostrils as needed.  ? hydrOXYzine (ATARAX)  25 MG tablet Take 1 tablet (25 mg total) by mouth every 8 (eight) hours as needed for itching.  ? ipratropium-albuterol (DUONEB) 0.5-2.5 (3) MG/3ML SOLN Take 3 mLs by nebulization every 4 (four) hours as needed.  ? isosorbide mononitrate (IMDUR) 60 MG 24 hr tablet TAKE 1 TABLET(60 MG) BY MOUTH DAILY  ? nitroGLYCERIN (NITROSTAT) 0.4 MG SL tablet PLACE 1 TABLET UNDER THE TONGUE EVERY 5 MINUTES FOR 3 DOSES AS NEEDED FOR CHEST PAIN  ? psyllium (METAMUCIL SMOOTH TEXTURE) 28 % packet Take 1 packet by mouth 3 (three) times daily as needed (constipation). unflavored sugar-free  ? Spacer/Aero-Holding Dorise Bullion Use with  inhaler.  ? vitamin B-12 (CYANOCOBALAMIN) 500 MCG tablet Take 500 mcg by mouth daily.  ? XARELTO 20 MG TABS tablet TAKE 1 TABLET(20 MG) BY MOUTH EVERY EVENING  ? ?No current facility-administered medications for this visit. (Other)  ? ?REVIEW OF SYSTEMS: ?ROS   ?Positive for: Gastrointestinal, Endocrine, Cardiovascular, Eyes, Respiratory ?Negative for: Constitutional, Neurological, Skin, Genitourinary, Musculoskeletal, HENT, Psychiatric, Allergic/Imm, Heme/Lymph ?Last edited by Kingsley Spittle, COT on 11/28/2021  1:55 PM.  ?  ? ?ALLERGIES ?Allergies  ?Allergen Reactions  ? Rofecoxib Anaphylaxis  ?  VIOXX  ? Tetracyclines & Related Nausea And Vomiting  ? Benadryl Allergy [Diphenhydramine Hcl] Other (See Comments)  ?  Depressed respirations  ? Cephalexin Rash  ? Gatifloxacin Other (See Comments)  ?  Confusion ?Very low blood pressure ?[place]  ? ? ?PAST MEDICAL HISTORY ?Past Medical History:  ?Diagnosis Date  ? Acute and chronic respiratory failure with hypoxia ([place]) 01/24/2018  ? Asthma   ? Blood in stool   ? Chicken pox   ? Chronic generalized abdominal pain   ? Chronic pain   ? CKD (chronic kidney disease), stage III ([place])   ? Colon polyps   ? COPD (chronic obstructive pulmonary disease) ([place])   ? COVID-19 2020  ? Depression   ? Diverticulitis   ? Very severe.  Has had 2 bowel resections and temporary colostomy in the past.  ? Fecal incontinence   ? Frequent headaches   ? GERD (gastroesophageal reflux disease)   ? Heart attack ([place])   ? Heart disease   ? History of blood transfusion   ? Hyperlipemia   ? Hypertension   ? Incontinence of feces 12/11/2016  ? Morbid obesity ([place])   ? Pneumonia due to infectious organism 01/12/2018  ? Postmenopausal 03/21/2021  ? PTSD (post-traumatic stress disorder)   ? Recurrent deep vein thrombosis (DVT) (H[facility])   ? x6  ? Urinary incontinence   ? ?Past Surgical History:  ?Procedure Laterality Date  ? ABDOMINAL HYSTERECTOMY  1995  ? APPENDECTOMY    ? CATARACT EXTRACTION Bilateral   ?  CESAREAN SECTION  1984  ? CHOLECYSTECTOMY  2016  ? COLOSTOMY    ? and reversal  ? CORONARY STENT INTERVENTION N/A 06/18/2019  ? Procedure: CORONARY STENT INTERVENTION;  Surgeon: Wellington Hampshire, MD;  Location: [place] [facility];  Service: Cardiovascular;  Laterality: N/A;  ? ENDOMETRIAL ABLATION    ? [place] and 2016 (2016 ventral w/ incarceration) 33 surgeries  ? HYSTEROSCOPY WITH D & C    ? 13  ? LAPAROSCOPIC LYSIS OF ADHESIONS    ? x2  ? LEFT HEART CATH AND CORONARY ANGIOGRAPHY N/A 06/18/2019  ? Procedure: LEFT HEART CATH AND CORONARY ANGIOGRAPHY;  Surgeon: Wellington Hampshire, MD;  Location: [facility];  Service: Cardiovascular;  Laterality:  N/A;  ? LEFT HEART CATH AND CORONARY ANGIOGRAPHY N/A 11/30/2019  ? Procedure: LEFT HEART CATH AND CORONARY ANGIOGRAPHY;  Surgeon: Troy Sine, MD;  Location: [facility];  Service: Cardiovascular;  Laterality: N/A;  ? 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 TONSILLECTOMY  1959  ? VEIN SURGERY    ? laser x2  ? ?FAMILY HISTORY ?Family History  ?Problem Relation Age of Onset  ? Depression Mother   ? Drug abuse Mother   ? Mental illness Mother   ? Learning disabilities Mother   ? Miscarriages / [place] Mother   ? Alcohol abuse Mother   ? Mental illness Father   ? Depression Father   ? Heart disease Father   ? Drug abuse Sister   ? Depression Sister   ? Asthma Sister   ? Alcohol abuse Sister   ? Early death Sister   ? Mental illness Son   ? Hypertension Son   ? Drug abuse Son   ? Depression Son   ? Asthma Son   ? Alcohol abuse Son   ? Clotting disorder Son   ? Heart disease Maternal Grandmother   ? Diabetes Maternal Grandmother   ? Arthritis Maternal Grandmother   ? COPD Maternal Grandmother   ? Parkinson's disease Maternal Grandmother   ? Heart disease Maternal Grandfather   ? Heart attack Maternal Grandfather   ? Early death Maternal Grandfather   ? COPD Maternal Grandfather   ? Heart disease Paternal Grandmother   ?  Heart attack Paternal Grandmother   ? Depression Paternal Grandmother   ? Arthritis Paternal Grandmother   ? Breast cancer Paternal Grandmother   ? Heart disease Paternal Grandfather   ? Heart attack Paternal

## 2021-11-28 ENCOUNTER — Encounter (INDEPENDENT_AMBULATORY_CARE_PROVIDER_SITE_OTHER): Payer: Self-pay | Admitting: Ophthalmology

## 2021-11-28 ENCOUNTER — Ambulatory Visit (INDEPENDENT_AMBULATORY_CARE_PROVIDER_SITE_OTHER): Payer: Medicare Other | Admitting: Ophthalmology

## 2021-11-28 DIAGNOSIS — I10 Essential (primary) hypertension: Secondary | ICD-10-CM | POA: Diagnosis not present

## 2021-11-28 DIAGNOSIS — H43813 Vitreous degeneration, bilateral: Secondary | ICD-10-CM

## 2021-11-28 DIAGNOSIS — H3581 Retinal edema: Secondary | ICD-10-CM

## 2021-11-28 DIAGNOSIS — Z961 Presence of intraocular lens: Secondary | ICD-10-CM | POA: Diagnosis not present

## 2021-11-28 DIAGNOSIS — E119 Type 2 diabetes mellitus without complications: Secondary | ICD-10-CM

## 2021-11-28 DIAGNOSIS — H35033 Hypertensive retinopathy, bilateral: Secondary | ICD-10-CM

## 2021-12-11 ENCOUNTER — Telehealth: Payer: Self-pay | Admitting: Family Medicine

## 2021-12-11 NOTE — Telephone Encounter (Signed)
Pt was returning a call to [place]. She said she does not answer the phone unless she recognises the # so if you can call from an identified #. ?

## 2021-12-11 NOTE — Telephone Encounter (Signed)
Informed pt that we had not called her since Feb. Pt stated that the call came from our office.  ?

## 2021-12-18 ENCOUNTER — Telehealth: Payer: Self-pay | Admitting: Family Medicine

## 2021-12-18 NOTE — Telephone Encounter (Signed)
Spoke with patient she stated to call back 12/2021. She was not feeling well today ?

## 2021-12-25 ENCOUNTER — Other Ambulatory Visit: Payer: Self-pay | Admitting: *Deleted

## 2021-12-25 DIAGNOSIS — R202 Paresthesia of skin: Secondary | ICD-10-CM

## 2022-01-03 ENCOUNTER — Telehealth: Payer: Self-pay | Admitting: Family Medicine

## 2022-01-03 NOTE — Telephone Encounter (Signed)
Spoke with patient to schedule AWV.  She stated her medications are too high and she cannot afford her medication.  She would like to know if she could prescribe something else.  She would like a call from Dr Claiborne Billings about her medications.   ? Patient name: Mary Ferguson  ?Ph# [phone] ?

## 2022-01-04 NOTE — Telephone Encounter (Signed)
LVM for pt to CB regarding medication.  ?

## 2022-01-08 ENCOUNTER — Encounter: Payer: Self-pay | Admitting: Vascular Surgery

## 2022-01-08 ENCOUNTER — Ambulatory Visit (INDEPENDENT_AMBULATORY_CARE_PROVIDER_SITE_OTHER): Payer: Medicare Other | Admitting: Vascular Surgery

## 2022-01-08 ENCOUNTER — Ambulatory Visit (HOSPITAL_COMMUNITY)
Admission: RE | Admit: 2022-01-08 | Discharge: 2022-01-08 | Disposition: A | Discharge status: Home / Self Care | Payer: Medicare Other | Source: Ambulatory Visit | Attending: Vascular Surgery | Admitting: Vascular Surgery

## 2022-01-08 VITALS — BP 129/89 | HR 96 | Temp 97.6°F | Resp 14 | Ht 62.0 in | Wt 259.0 lb

## 2022-01-08 DIAGNOSIS — R202 Paresthesia of skin: Secondary | ICD-10-CM

## 2022-01-08 DIAGNOSIS — I872 Venous insufficiency (chronic) (peripheral): Secondary | ICD-10-CM | POA: Diagnosis not present

## 2022-01-08 DIAGNOSIS — M7989 Other specified soft tissue disorders: Secondary | ICD-10-CM
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
## 2022-01-08 NOTE — Telephone Encounter (Signed)
LM for pt to return call to discuss.  

## 2022-01-10 ENCOUNTER — Other Ambulatory Visit: Payer: Self-pay

## 2022-01-10 DIAGNOSIS — I10 Essential (primary) hypertension: Secondary | ICD-10-CM

## 2022-01-10 DIAGNOSIS — I2510 Atherosclerotic heart disease of native coronary artery without angina pectoris: Secondary | ICD-10-CM

## 2022-01-10 LAB — COMPREHENSIVE METABOLIC PANEL
ALT: 12 IU/L (ref 0–32)
AST: 10 IU/L (ref 0–40)
Albumin/Globulin Ratio: 1.4 (ref 1.2–2.2)
Albumin: 4 g/dL (ref 3.8–4.8)
Alkaline Phosphatase: 131 IU/L — ABNORMAL HIGH (ref 44–121)
BUN/Creatinine Ratio: 30 — ABNORMAL HIGH (ref 12–28)
BUN: 40 mg/dL — ABNORMAL HIGH (ref 8–27)
Bilirubin Total: 0.6 mg/dL (ref 0.0–1.2)
CO2: 23 mmol/L (ref 20–29)
Calcium: 10.2 mg/dL (ref 8.7–10.3)
Chloride: 103 mmol/L (ref 96–106)
Creatinine, Ser: 1.34 mg/dL — ABNORMAL HIGH (ref 0.57–1.00)
Globulin, Total: 2.9 g/dL (ref 1.5–4.5)
Glucose: 137 mg/dL — ABNORMAL HIGH (ref 70–99)
Potassium: 5 mmol/L (ref 3.5–5.2)
Sodium: 138 mmol/L (ref 134–144)
Total Protein: 6.9 g/dL (ref 6.0–8.5)
eGFR: 43 mL/min/{1.73_m2} — ABNORMAL LOW (ref >59)

## 2022-01-10 LAB — LIPID PANEL
Chol/HDL Ratio: 3.1 ratio (ref 0.0–4.4)
Cholesterol, Total: 128 mg/dL (ref 100–199)
HDL: 41 mg/dL (ref >39)
LDL Chol Calc (NIH): 68 mg/dL (ref 0–99)
Triglycerides: 103 mg/dL (ref 0–149)
VLDL Cholesterol Cal: 19 mg/dL (ref 5–40)

## 2022-01-14 ENCOUNTER — Encounter: Payer: Self-pay | Admitting: *Deleted

## 2022-01-17 ENCOUNTER — Ambulatory Visit: Payer: Medicare Other | Admitting: Cardiovascular Disease

## 2022-02-16 IMAGING — CT CT VIRTUAL COLONOSCOPY DIAGNOSTIC
3 of 13 series · 11 of 46 positions shown, 17 images · non-contrast
Comparison: None.

CLINICAL DATA: Rectal bleeding. History of polyps, diverticulitis,
and colostomy with reversal.

EXAM:
CT VIRTUAL COLONOSCOPY DIAGNOSTIC
TECHNIQUE: The patient was given a standard bowel preparation with Gastrografin
and barium for fluid and stool tagging respectively. The quality of
the bowel preparation is poor. Automated CO2 insufflation of the
colon was performed prior to image acquisition and colonic
distention is poor. Image post processing was used to generate a 3D
endoluminal fly-through projection of the colon and to
electronically subtract stool/fluid as appropriate.

[Series 6: supine colon 1.50 br40 s3 supine thins · axial · 0.98mm/px · z∈[+1284,+1647]mm · 4 of 404 slices shown]
[im 81/404  soft-tissue]
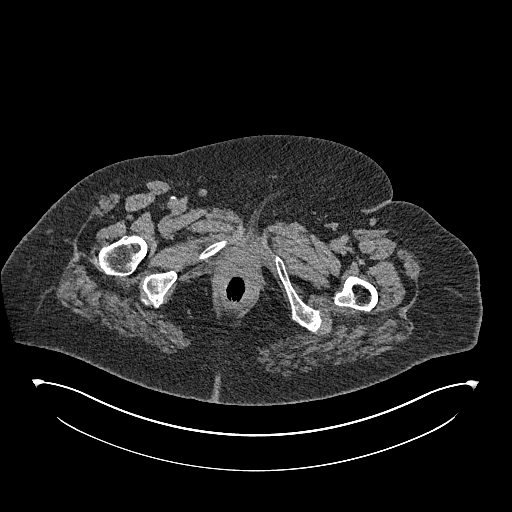
[im 162/404  soft-tissue]
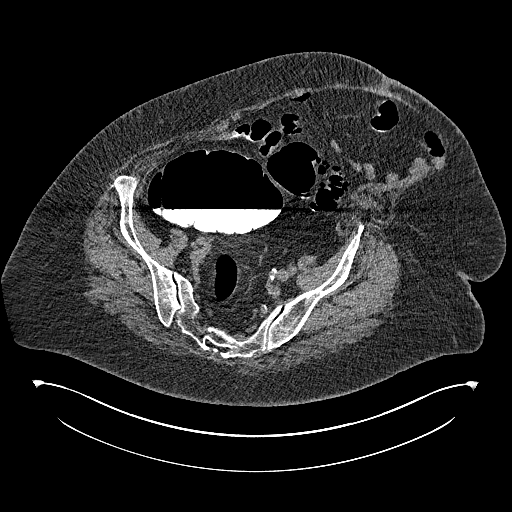
[im 242/404  soft-tissue]
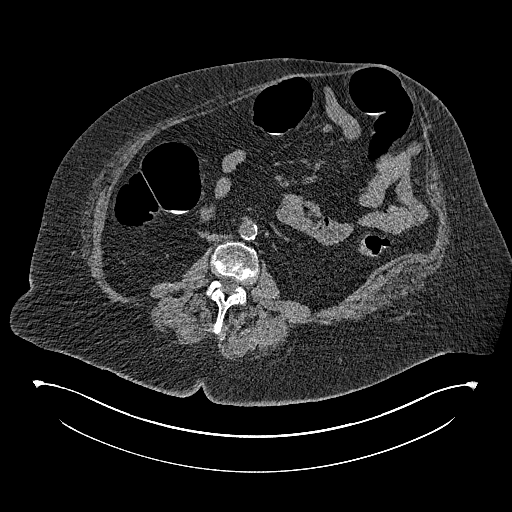
[im 323/404  soft-tissue]
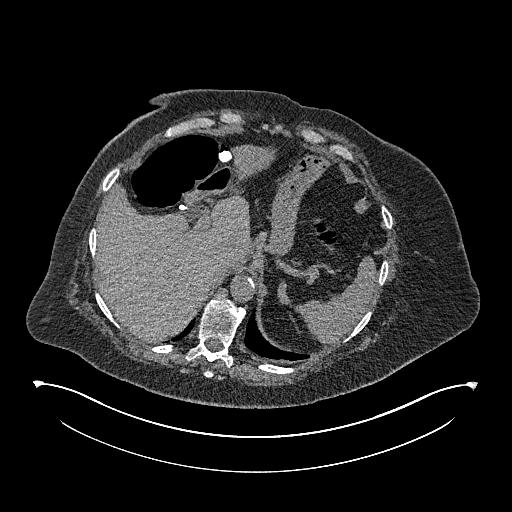

[Series 13: prone colon 1.50 br40 s3 prone thin · axial · 0.98mm/px · z∈[+1359,+1755]mm · 5 of 396 slices shown, 10 images]
[im 66/396  soft-tissue]
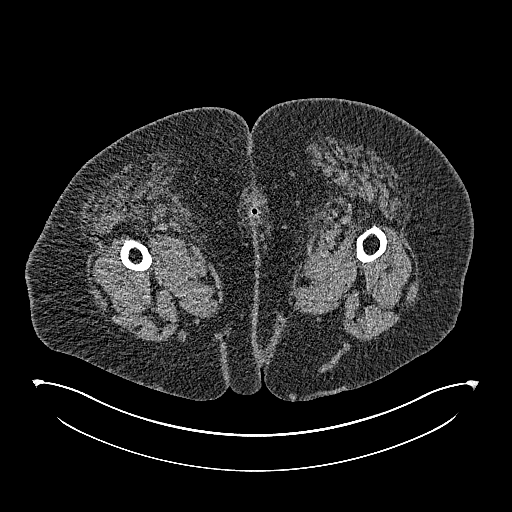
[im 66/396  bone]
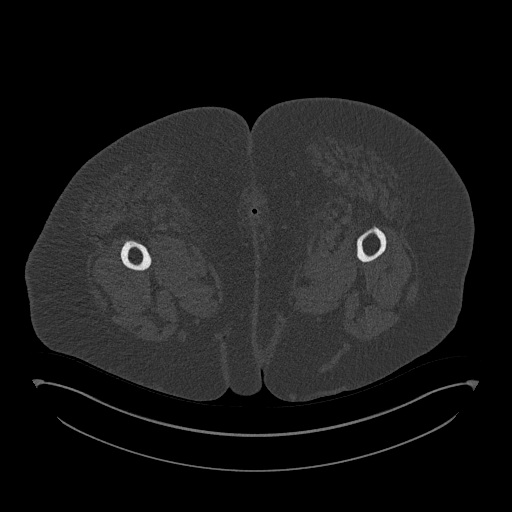
[im 132/396  soft-tissue]
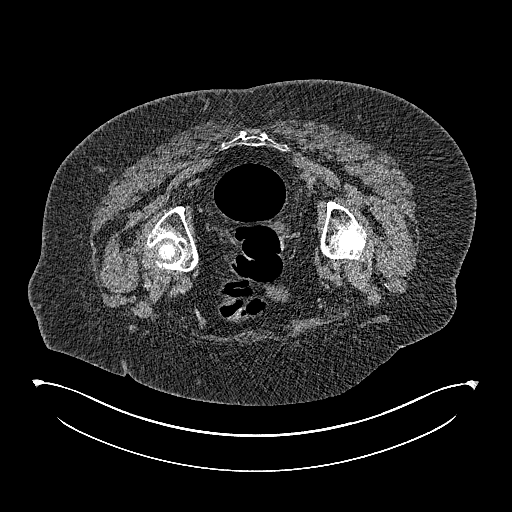
[im 132/396  lung]
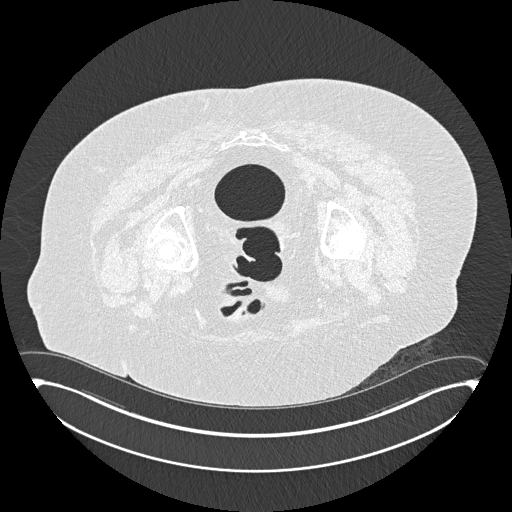
[im 198/396  soft-tissue]
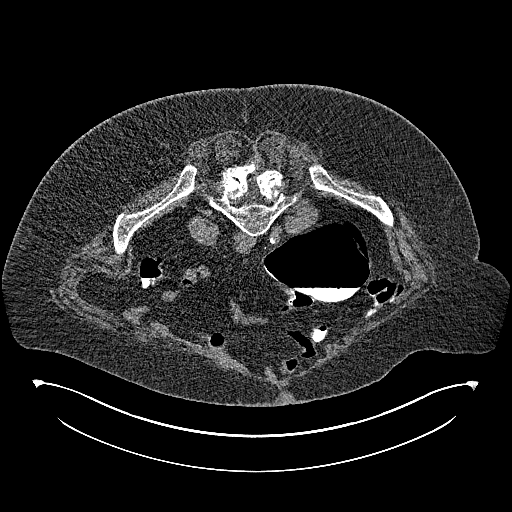
[im 198/396  lung]
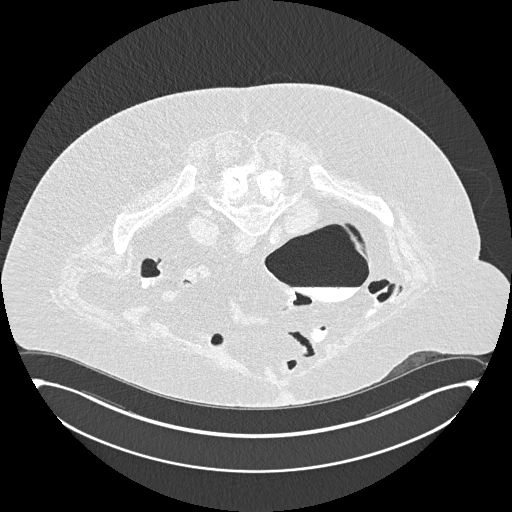
[im 264/396  soft-tissue]
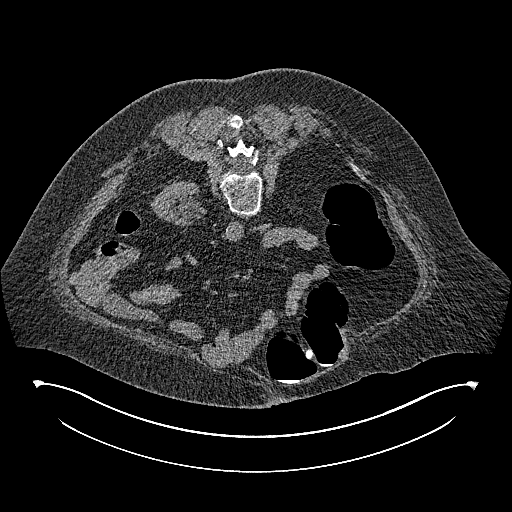
[im 264/396  lung]
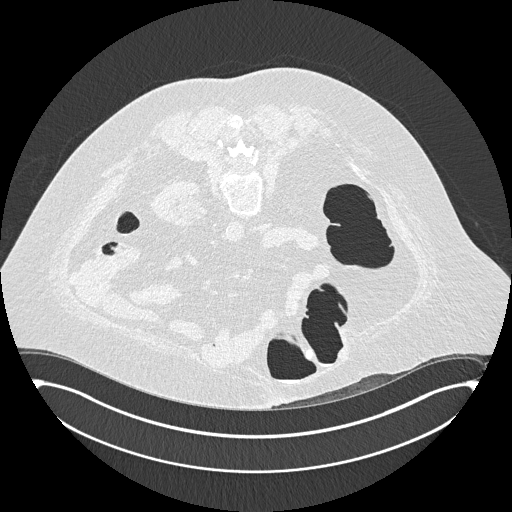
[im 330/396  soft-tissue]
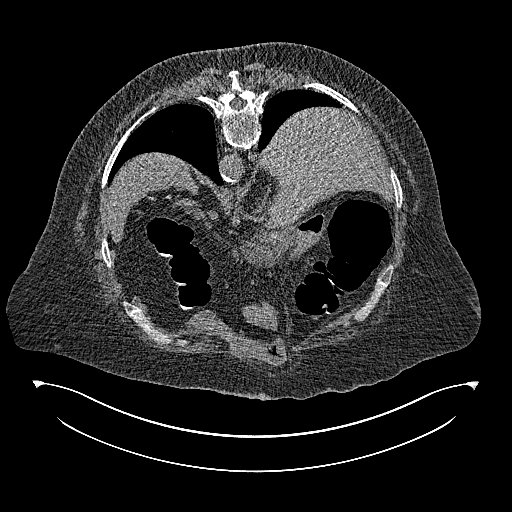
[im 330/396  lung]
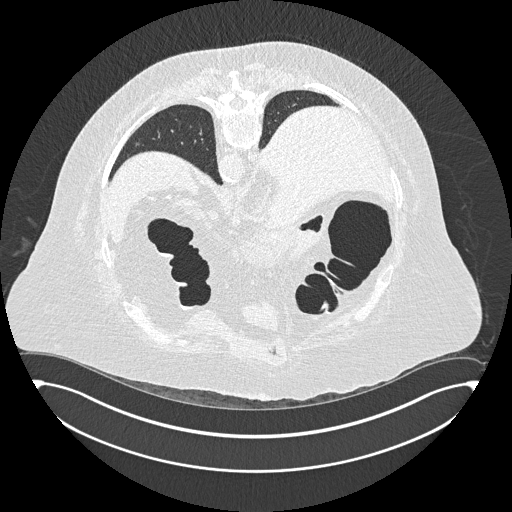

[Series 21: rt decub colon 3.00 br40 s3 cor rt decub · coronal · 0.75mm/px · 2 of 166 slices shown, 3 images]
[im 56/166  soft-tissue]
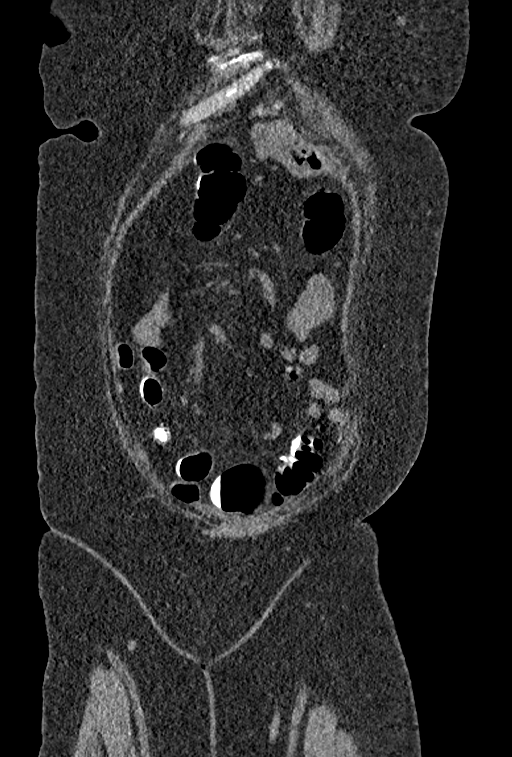
[im 56/166  bone]
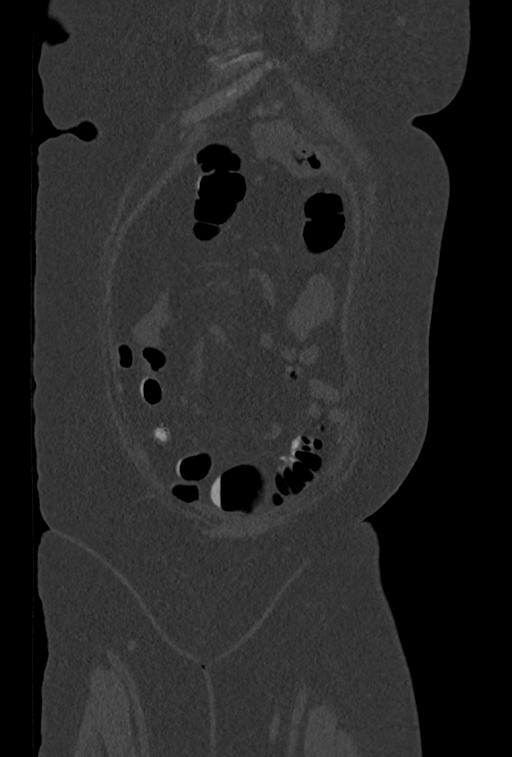
[im 111/166  soft-tissue]
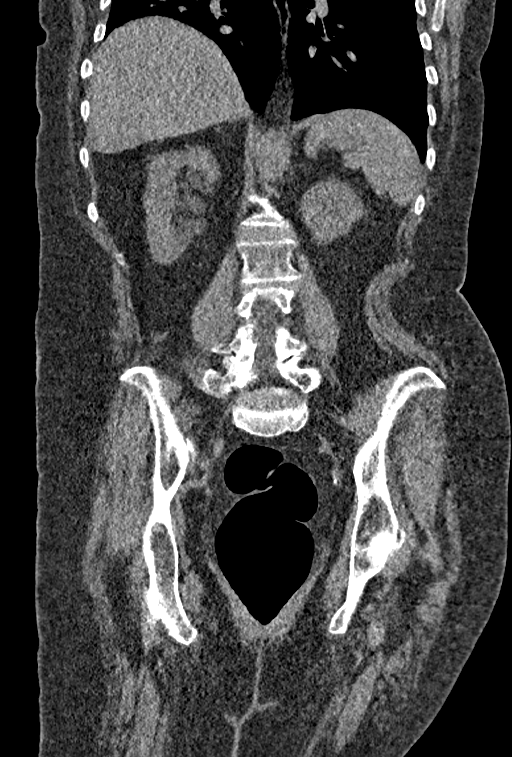

[11 of 46 positions shown; findings below may reference images not displayed]

FINDINGS: VIRTUAL COLONOSCOPY

Diffuse colonic diverticulosis, most extensive in the sigmoid colon
and descending colon. Poor distention of the sigmoid colon and
descending colon. Moderate retained barium throughout the colon. No
fixed polypoid filling defects or visible annular constricting
lesions in the well distended portions of the colon.

Virtual colonoscopy is not designed to detect diminutive polyps
(i.e., less than or equal to 5 mm), the presence or absence of which
may not affect clinical management.

CT ABDOMEN AND PELVIS WITHOUT CONTRAST

Lower chest: Elevation of the right hemidiaphragm. No acute
abnormality.

Hepatobiliary: No focal liver abnormality is seen. Status post
cholecystectomy. No biliary dilatation.

Pancreas: No focal abnormality or ductal dilatation.

Spleen: No focal abnormality.  Normal size.

Adrenals/Urinary Tract: Bilateral renal parapelvic cysts. No
hydronephrosis. Adrenal glands unremarkable. Urinary bladder
decompressed.

Stomach/Bowel: Stomach and small bowel decompressed, unremarkable.

Vascular/Lymphatic: Aortic atherosclerosis. No enlarged abdominal or
pelvic lymph nodes.

Reproductive: Prior hysterectomy.  No adnexal masses.

Other: No free fluid or free air. Ventral wall laxity noted in
multiple places. Small ventral wall hernia superiorly just below the
xiphoid containing the anterior wall of the stomach.

Musculoskeletal: No acute bony abnormality. Chronic appearing
compression fracture at L1, mild.
IMPRESSION: Under distention of the left colon likely related to diffuse severe
diverticulosis. This somewhat limits evaluation. No visible
persistent non mobile polypoid filling defect or annular
constricting lesions. Diffuse colonic diverticulosis.

Aortic atherosclerosis.

Ventral wall laxity. Small superior ventral wall hernia containing
the anterior wall of the stomach.

Bilateral renal parapelvic cysts.

## 2022-03-18 ENCOUNTER — Telehealth: Payer: Self-pay | Admitting: Cardiovascular Disease

## 2022-03-18 NOTE — Telephone Encounter (Signed)
Provided Mary Cella, RN with Patient Assistance Program application for Xarelto ([employer]) and information about SHIIP/Extra Help (Low Income Subsidy) assistance to look at [facility] benefits and medication affordability challenges. Pt may or may not qualify for these, a tier exception also should be considered. I will f/u with pt later this week to ensure she understands how to complete and return the application and utilize SHIIP line. I remain available.   Octavio Graves, MSW, LCSW Clinical Social Worker II [facility] Navigation  [phone]- work cell phone (preferred) [phone]- desk phone

## 2022-03-18 NOTE — Telephone Encounter (Signed)
Pt returned call. Spoke with pt for 22 minutes, she is very upset her Xarelto is now $740. She is crying on the phone. Pt states she has tried to apply for assistance before but was denied because she makes too much. I asked would she be willing to apply again since she has paid out of pocket for different prescriptions all year. She is willing to try again. Spoke with Rutherford Nail, Sports coach. She will try to assist pt with applying for patient assistance. Samples placed at front desk for pt with patient assistance form.

## 2022-03-18 NOTE — Telephone Encounter (Signed)
Left message for patient to return the call.

## 2022-03-18 NOTE — Telephone Encounter (Signed)
Patient calling the office for samples of medication:   1.  What medication and dosage are you requesting samples for? XARELTO 20 MG TABS tablet  2.  Are you currently out of this medication?   Yes, patient is very upset as she states this medication will now cost her $600-700/month.  She assumes she is not eligible for patient assistance because she makes too much money. Is there anything that can be worked out for this patient? Please advise.

## 2022-03-21 ENCOUNTER — Telehealth: Payer: Self-pay | Admitting: Licensed Clinical Social Worker

## 2022-03-21 NOTE — Telephone Encounter (Signed)
H&V Care Navigation CSW Progress Note  Clinical Social Worker contacted patient by phone to f/u on PAP paperwork and SHIIP information given to pt by [facility] on 7/24 and screen for further assistance. No answer at [phone]. Left message for pt. Will re-attempt as able.   Patient is participating in a Managed Medicaid Plan:  No, Traditional Medicare only  SDOH Screenings   Alcohol Screen: Not on file  Depression (RSW5-4): Not on file  Financial Resource Strain: Not on file  Food Insecurity: Not on file  Housing: Not on file  Physical Activity: Not on file  Social Connections: Not on file  Stress: Not on file  Tobacco Use: Medium Risk (01/08/2022)   Patient History    Smoking Tobacco Use: Former    Smokeless Tobacco Use: Never    Passive Exposure: Not on file  Transportation Needs: Not on file   Octavio Graves, MSW, LCSW Clinical Social Worker II [facility] Health Heart/Vascular Care Navigation  [phone]- work cell phone (preferred) [phone]- desk phone

## 2022-03-25 ENCOUNTER — Telehealth: Payer: Self-pay | Admitting: Licensed Clinical Social Worker

## 2022-03-25 NOTE — Telephone Encounter (Signed)
H&V Care Navigation CSW Progress Note  Clinical Social Worker contacted patient by phone to f/u on PAP paperwork and SHIIP information given to pt by [facility] on 7/24 and screen for further assistance. No answer at [phone]. Left 2nd message for pt. Will re-attempt as able.    Patient is participating in a Managed Medicaid Plan:  No, Traditional Medicare only  SDOH Screenings   Alcohol Screen: Not on file  Depression (PJA2-5): Not on file  Financial Resource Strain: Not on file  Food Insecurity: Not on file  Housing: Not on file  Physical Activity: Not on file  Social Connections: Not on file  Stress: Not on file  Tobacco Use: Medium Risk (01/08/2022)   Patient History    Smoking Tobacco Use: Former    Smokeless Tobacco Use: Never    Passive Exposure: Not on file  Transportation Needs: Not on file   Octavio Graves, MSW, LCSW Clinical Social Worker II [facility] Heart/Vascular Care Navigation  [phone]- work cell phone (preferred) [phone]- desk phone

## 2022-03-28 ENCOUNTER — Telehealth: Payer: Self-pay | Admitting: Licensed Clinical Social Worker

## 2022-03-28 NOTE — Telephone Encounter (Signed)
H&V Care Navigation CSW Progress Note  Clinical Social Worker contacted patient by phone to f/u on medications and assistance options. No answer again at [phone], left a third voicemail. Called alternate "home" number [phone] listed, which led to pt son Gerilyn Pilgrim, introduced self, role, requested he have pt give me a call. Pt son noted that he is aware there are medications waiting for pt, encouraged him to pick these up as pt should try to not miss any doses.   Patient is participating in a Managed Medicaid Plan:  No, appears to have Traditional Medicare only?  SDOH Screenings   Alcohol Screen: Not on file  Depression (KCL2-7): Not on file  Financial Resource Strain: Not on file  Food Insecurity: Not on file  Housing: Not on file  Physical Activity: Not on file  Social Connections: Not on file  Stress: Not on file  Tobacco Use: Medium Risk (01/08/2022)   Patient History    Smoking Tobacco Use: Former    Smokeless Tobacco Use: Never    Passive Exposure: Not on file  Transportation Needs: Not on file    Octavio Graves, MSW, LCSW Clinical Social Worker II [facility] Heart/Vascular Care Navigation  [phone]- work cell phone (preferred) [phone]- desk phone

## 2022-04-04 ENCOUNTER — Ambulatory Visit (INDEPENDENT_AMBULATORY_CARE_PROVIDER_SITE_OTHER): Payer: Medicare Other | Admitting: Cardiovascular Disease

## 2022-04-04 ENCOUNTER — Encounter: Payer: Self-pay | Admitting: Cardiovascular Disease

## 2022-04-04 VITALS — BP 119/77 | HR 92 | Ht 63.0 in | Wt 254.2 lb

## 2022-04-04 DIAGNOSIS — I25119 Atherosclerotic heart disease of native coronary artery with unspecified angina pectoris: Secondary | ICD-10-CM | POA: Diagnosis not present

## 2022-04-04 DIAGNOSIS — I2510 Atherosclerotic heart disease of native coronary artery without angina pectoris: Secondary | ICD-10-CM

## 2022-04-04 DIAGNOSIS — Z9861 Coronary angioplasty status: Secondary | ICD-10-CM | POA: Diagnosis not present

## 2022-04-04 DIAGNOSIS — E785 Hyperlipidemia, unspecified: Secondary | ICD-10-CM

## 2022-04-04 DIAGNOSIS — Z86718 Personal history of other venous thrombosis and embolism: Secondary | ICD-10-CM

## 2022-04-04 DIAGNOSIS — E119 Type 2 diabetes mellitus without complications: Secondary | ICD-10-CM | POA: Diagnosis not present

## 2022-04-04 DIAGNOSIS — I10 Essential (primary) hypertension: Secondary | ICD-10-CM

## 2022-04-04 NOTE — Progress Notes (Signed)
Cardiology Office Note   Date:  04/04/2022   ID:  Mary Ferguson, DOB 03/16/1951, MRN [id number]  PCP:  Ma Hillock, DO  Cardiologist:  Sanda Klein, MD  Electrophysiologist:  None   Evaluation Performed:  Follow-Up Visit  CAD Chief Complaint: Leg pain  History of Present Illness:    Mary Ferguson is a 71 y.o. female with morbid obesity, DM, HTN, mixed hyperlipidemia, history of recurrent DVT, CKD 3a and chronic pain, diagnosed with coronary artery disease when she presented with inferior wall ST segment elevation myocardial infarction in October 2020 and received drug-eluting stents to the mid and distal right coronary artery (Dr. Fletcher Anon, resolute Onyx 3.5x26 and 3.0x18).  At that time she was also noted to have a 70% mid LAD stenosis that appeared to be chronic.  She had transient LV dysfunction with an EF of about 40% at the time of LV angiography, 60-65% on follow-up echocardiogram the next day.  At the time of cath LV EDP was mildly elevated at 22 mmHg. Repeat angiography in April 2021 showed no change.  She has not had any problems with chest discomfort except on 1 occasion when she found that her brother had passed away and she was very emotional, resolved with a single sublingual nitroglycerin.  She does not have symptoms with daily activities.  She denies shortness of breath at rest or with activity.  He has not been troubled by palpitations.  She has chronic bilateral lower extremity edema that is symmetrical and has not changed.  She had some pain overlying the right lateral thigh and was worried that this might be DVT.  It is associated with paresthesias and sounds more like meralgia paresthetica.  She has not had falls, injuries or bleeding problems and is compliant with full dose Xarelto anticoagulation, 20 mg daily.  She is scared to decrease the rivaroxaban dose to 10 mg.  When this was done in the past she had her heart attack.  She prefers to continue taking the 20 mg  dose.  Continues to be very sedentary, has severe low back pain.  Uses walker.  She is on high-dose atorvastatin and her most recent LDL cholesterol was 68.  Glycemic control is fair with a hemoglobin A1c of 7.2%.  She was prescribed Farxiga, but stopped taking this after a month due to financial issues.  She has not applied for patient assistance..   She bears a diagnosis of CKD stage III.  Her creatinine has been in the 1.0-1.2 range consistently.  She has a history of recurrent DVT and is on chronic treatment with Xarelto.  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  She was on chronic opiate therapy for a while, but her quality of life deteriorated substantially so she stopped taking them and is very afraid of becoming addicted to them in the future.   Past Medical History:  Diagnosis Date   Acute and chronic respiratory failure with hypoxia ([place]) 01/24/2018   Asthma    Blood in stool    Chicken pox    Chronic generalized abdominal pain    Chronic pain    CKD (chronic kidney disease), stage III (HCC)    Colon polyps    COPD (chronic obstructive pulmonary disease) ([place])    COVID-19 2020   Depression    Diverticulitis    Very severe.  Has had  2 bowel resections and temporary colostomy in the past.   Fecal incontinence    Frequent headaches    GERD (gastroesophageal reflux disease)    Heart attack (HCC)    Heart disease    History of blood transfusion    Hyperlipemia    Hypertension    Incontinence of feces 12/11/2016   Morbid obesity (HCC)    Pneumonia due to infectious organism 01/12/2018   Postmenopausal 03/21/2021   PTSD (post-traumatic stress disorder)    Recurrent deep vein thrombosis (DVT) (HCC)    x6   Urinary incontinence    Past Surgical History:  Procedure Laterality Date   ABDOMINAL HYSTERECTOMY  1995    APPENDECTOMY     CATARACT EXTRACTION Bilateral    CESAREAN SECTION  1984   CHOLECYSTECTOMY  2016   COLOSTOMY     and reversal   CORONARY STENT INTERVENTION N/A 06/18/2019   Procedure: CORONARY STENT INTERVENTION;  Surgeon: Iran Ouch, MD;  Location: [facility] INVASIVE CV LAB;  Service: Cardiovascular;  Laterality: N/A;   ENDOMETRIAL ABLATION     HERNIA REPAIR  1985   1985 and 2016 (2016 ventral w/ incarceration) 33 surgeries   HYSTEROSCOPY WITH D & C     14   LAPAROSCOPIC LYSIS OF ADHESIONS     x2   LEFT HEART CATH AND CORONARY ANGIOGRAPHY N/A 06/18/2019   Procedure: LEFT HEART CATH AND CORONARY ANGIOGRAPHY;  Surgeon: Iran Ouch, MD;  Location: [facility] INVASIVE CV LAB;  Service: Cardiovascular;  Laterality: N/A;   LEFT HEART CATH AND CORONARY ANGIOGRAPHY N/A 11/30/2019   Procedure: LEFT HEART CATH AND CORONARY ANGIOGRAPHY;  Surgeon: Lennette Bihari, MD;  Location: [facility] INVASIVE CV LAB;  Service: Cardiovascular;  Laterality: N/A;   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   amitriptyline (ELAVIL) 50 MG tablet Take 1 tablet (50 mg total) by mouth at bedtime.   amLODipine (NORVASC) 2.5 MG tablet Take 1 tablet (2.5 mg total) by mouth daily.   atorvastatin (LIPITOR) 80 MG tablet Take 1 tablet (80 mg total) by mouth every evening.   carvedilol (COREG) 6.25 MG tablet TAKE 1 TABLET BY MOUTH IN THE MORNING AND 2 TABLETS IN THE EVENING   dicyclomine (BENTYL) 20 MG tablet Take 1 tablet (20 mg total) by mouth 4 (four) times daily as needed for spasms.   famotidine (PEPCID) 40 MG tablet Take 1 tablet (40 mg total) by mouth at bedtime.   hydrOXYzine (ATARAX) 25 MG tablet Take 1 tablet (25 mg total) by mouth every 8 (eight) hours as needed for itching.   isosorbide mononitrate (IMDUR)  60 MG 24 hr tablet TAKE 1 TABLET(60 MG) BY MOUTH DAILY   nitroGLYCERIN (NITROSTAT) 0.4 MG SL tablet PLACE 1 TABLET UNDER THE TONGUE EVERY 5 MINUTES FOR 3 DOSES AS NEEDED FOR CHEST PAIN   vitamin B-12 (CYANOCOBALAMIN) 500 MCG tablet Take 500 mcg by mouth daily.   XARELTO 20 MG TABS tablet TAKE 1 TABLET(20 MG) BY MOUTH EVERY EVENING     Allergies:   Rofecoxib, Tetracyclines & related, Benadryl allergy [diphenhydramine hcl], Cephalexin, and Gatifloxacin   Social History   Tobacco Use   Smoking status: Former   Smokeless tobacco: Never  Vaping  Use   Vaping Use: Never used  Substance Use Topics   Alcohol use: Not Currently   Drug use: Never     Family Hx: The patient's family history is significant for the absence of premature onset CAD or PAD. ROS:   Please see the history of present illness.    All other systems are reviewed and are negative.   Prior CV studies:   The following studies were reviewed today:  Cardiac catheterization 06/18/2019  Diagnostic Dominance: Right  Intervention   Implants    Permanent Stent  Stent Resolute Onyx 3.5x26 - [id number] - Implanted  Inventory item: Dreama Saa 3.8H82 Model/Cat number: [id number]  Manufacturer: [employer] Lot number: [id number]  Device identifier: [id number] Device identifier type: GS1  Area Of Implantation: Mid RCA        Cardiac cath 11/30/2019  Previously placed Prox RCA to Mid RCA stent (unknown type) is widely patent. Previously placed Dist RCA stent (unknown type) is widely patent. Prox Cx to Mid Cx lesion is 30% stenosed. Prox LAD lesion is 30% stenosed. Mid LAD-1 lesion is 40% stenosed. Mid LAD-2 lesion is 70% stenosed. Dist LAD lesion is 40% stenosed.   Widely patent mid and distal RCA stents in the dominant RCA vessel.   Mild to moderate concomitant CAD with 30 and 40% proximal LAD stenoses, no change in the previously noted focal 70% mid LAD stenosis with 40% narrowing  beyond this segment; normal small ramus intermediate vessel and tortuous circumflex vessel with 30% proximal narrowing.   LVEDP 8 mmHg  Diagnostic Dominance: Right      Labs/Other Tests and Data Reviewed:   CARDIAC CATH 11/30/2019: Diagnostic Dominance: Right    EKG:  Ordered today shows borderline sinus tachycardia, left axis deviation with pulmonary disease pattern, no ischemic repolarization changes, QTC 450 ms.  Recent Labs: 10/19/2021: Hemoglobin 14.0; Platelets 286; TSH 2.10 01/10/2022: ALT 12; BUN 40; Creatinine, Ser 1.34; Potassium 5.0; Sodium 138   Recent Lipid Panel Lab Results  Component Value Date/Time   CHOL 128 01/10/2022 09:48 AM   TRIG 103 01/10/2022 09:48 AM   HDL 41 01/10/2022 09:48 AM   CHOLHDL 3.1 01/10/2022 09:48 AM   CHOLHDL 5.2 06/18/2019 03:48 AM   LDLCALC 68 01/10/2022 09:48 AM    Wt Readings from Last 3 Encounters:  04/04/22 254 lb 3.2 oz (115.3 kg)  01/08/22 259 lb (117.5 kg)  10/19/21 262 lb (118.8 kg)     Objective:    Vital Signs:  BP 119/77 (BP Location: Left Arm, Patient Position: Sitting, Cuff Size: Large)   Pulse 92   Ht 5\' 3"  (1.6 m)   Wt 254 lb 3.2 oz (115.3 kg)   SpO2 96%   BMI 45.03 kg/m      General: Alert, oriented x3, no distress, morbidly obese Head: no evidence of trauma, PERRL, EOMI, no exophtalmos or lid lag, no myxedema, no xanthelasma; normal ears, nose and oropharynx Neck: normal jugular venous pulsations and no hepatojugular reflux; brisk carotid pulses without delay and no carotid bruits Chest: clear to auscultation, no signs of consolidation by percussion or palpation, normal fremitus, symmetrical and full respiratory excursions Cardiovascular: normal position and quality of the apical impulse, regular rhythm, normal first and second heart sounds, no murmurs, rubs or gallops Abdomen: no tenderness or distention, no masses by palpation, no abnormal pulsatility or arterial bruits, normal bowel sounds, no  hepatosplenomegaly Extremities: Mild cyanosis and bilateral soft 2+ edema of the ankles and feet; 2+ radial, ulnar  and brachial pulses bilaterally; 2+ right femoral, posterior tibial and dorsalis pedis pulses; 2+ left femoral, posterior tibial and dorsalis pedis pulses; no subclavian or femoral bruits Neurological: grossly nonfocal Psych: Normal mood and affect   ASSESSMENT & PLAN:    1. Coronary artery disease involving native coronary artery of native heart with angina pectoris ([place])   2. Hyperlipidemia LDL goal <70   3. Controlled type 2 diabetes mellitus without complication, without long-term current use of insulin ([place])   4. History of recurrent deep vein thrombosis (DVT)   5. Essential hypertension   6. Morbid obesity ([place])       CAD: No symptoms to suggest unstable angina.  She has nitroglycerin responsive chest pain that may be due to coronary vasospasm.  Current dose of carvedilol chosen to avoid bradycardia.  Also on amlodipine.  Repeat cardiac catheterization in April 2021 did not show any evidence of in-stent restenosis or progression of disease.  Not on aspirin since she is fully anticoagulated with Xarelto. HLP: Lipid parameters in desirable range. DM: Glycemic control is not as good.  A1c is now up to 7.2%.  Discussed the improved prognosis in patients with cardiovascular disease was diabetes is treated with GLP-1 agonist or SGLT2 inhibitors, rather than conventional treatment with insulin secretagogues.  Would encourage her to try [facility] again and apply for patient assistance. Hx of Recurrent DVT: Prefers to continue full 20 mg dose Xarelto lifelong.  Current leg pain does not sound like a DVT.  Consider lumbar radiculopathy versus meralgia paresthetica. HTN: Good blood pressure control on current medical regimen. Morbid obesity: Unable to exercise.  Encourage carbohydrate/calorie restriction.    Patient Instructions  Medication Instructions:  No changes *If you need a  refill on your cardiac medications before your next appointment, please call your pharmacy*   Lab Work: None ordered If you have labs (blood work) drawn today and your tests are completely normal, you will receive your results only by: [place] (if you have MyChart) OR A paper copy in the mail If you have any lab test that is abnormal or we need to change your treatment, we will call you to review the results.   Testing/Procedures: None ordered   Follow-Up: At [facility], you and your health needs are our priority.  As part of our continuing mission to provide you with exceptional heart care, we have created designated Provider Care Teams.  These Care Teams include your primary Cardiologist (physician) and Advanced Practice Providers (APPs -  Physician Assistants and Nurse Practitioners) who all work together to provide you with the care you need, when you need it.  We recommend signing up for the patient portal called "MyChart".  Sign up information is provided on this After Visit Summary.  MyChart is used to connect with patients for Virtual Visits (Telemedicine).  Patients are able to view lab/test results, encounter notes, upcoming appointments, etc.  Non-urgent messages can be sent to your provider as well.   To learn more about what you can do with MyChart, go to [email or web address].    Your next appointment:   12 month(s)  The format for your next appointment:   In Person  Provider:   Sanda Klein, MD {    Important Information About Sugar          Signed, Sanda Klein, MD  04/04/2022 11:25 AM    [place]

## 2022-04-04 NOTE — Patient Instructions (Signed)

## 2022-04-05 ENCOUNTER — Encounter: Payer: Self-pay | Admitting: Family Medicine

## 2022-04-05 ENCOUNTER — Ambulatory Visit (INDEPENDENT_AMBULATORY_CARE_PROVIDER_SITE_OTHER): Payer: Medicare Other | Admitting: Family Medicine

## 2022-04-05 VITALS — BP 95/67 | HR 91 | Temp 98.0°F | Ht 62.0 in | Wt 254.0 lb

## 2022-04-05 DIAGNOSIS — I252 Old myocardial infarction: Secondary | ICD-10-CM | POA: Diagnosis not present

## 2022-04-05 DIAGNOSIS — R202 Paresthesia of skin: Secondary | ICD-10-CM

## 2022-04-05 DIAGNOSIS — G894 Chronic pain syndrome: Secondary | ICD-10-CM

## 2022-04-05 DIAGNOSIS — E785 Hyperlipidemia, unspecified: Secondary | ICD-10-CM

## 2022-04-05 DIAGNOSIS — I10 Essential (primary) hypertension: Secondary | ICD-10-CM

## 2022-04-05 DIAGNOSIS — N1831 Chronic kidney disease, stage 3a: Secondary | ICD-10-CM

## 2022-04-05 DIAGNOSIS — M5416 Radiculopathy, lumbar region: Secondary | ICD-10-CM

## 2022-04-05 DIAGNOSIS — D6869 Other thrombophilia: Secondary | ICD-10-CM

## 2022-04-05 DIAGNOSIS — Z23 Encounter for immunization: Secondary | ICD-10-CM | POA: Diagnosis not present

## 2022-04-05 DIAGNOSIS — E118 Type 2 diabetes mellitus with unspecified complications: Secondary | ICD-10-CM | POA: Diagnosis not present

## 2022-04-05 DIAGNOSIS — J4540 Moderate persistent asthma, uncomplicated: Secondary | ICD-10-CM

## 2022-04-05 DIAGNOSIS — L298 Other pruritus: Secondary | ICD-10-CM

## 2022-04-05 DIAGNOSIS — K580 Irritable bowel syndrome with diarrhea: Secondary | ICD-10-CM

## 2022-04-05 DIAGNOSIS — Z6841 Body Mass Index (BMI) 40.0 and over, adult: Secondary | ICD-10-CM

## 2022-04-05 DIAGNOSIS — S32010A Wedge compression fracture of first lumbar vertebra, initial encounter for closed fracture: Secondary | ICD-10-CM

## 2022-04-05 DIAGNOSIS — E559 Vitamin D deficiency, unspecified: Secondary | ICD-10-CM

## 2022-04-05 DIAGNOSIS — G629 Polyneuropathy, unspecified: Secondary | ICD-10-CM

## 2022-04-05 DIAGNOSIS — F330 Major depressive disorder, recurrent, mild: Secondary | ICD-10-CM

## 2022-04-05 MED ORDER — FAMOTIDINE 40 MG PO TABS: 40.0000 mg | ORAL_TABLET | Freq: Every day | ORAL | 1 refills | Status: DC

## 2022-04-05 MED ORDER — HYDROXYZINE HCL 25 MG PO TABS
25.0000 mg | ORAL_TABLET | Freq: Three times a day (TID) | ORAL | 1 refills | Status: DC | PRN
Start: 2022-04-05 — End: 2022-09-17

## 2022-04-05 MED ORDER — DICYCLOMINE HCL 20 MG PO TABS
20.0000 mg | ORAL_TABLET | Freq: Four times a day (QID) | ORAL | 5 refills | Status: DC | PRN
Start: 2022-04-05 — End: 2022-09-17

## 2022-04-05 MED ORDER — AMITRIPTYLINE HCL 50 MG PO TABS: 50.0000 mg | ORAL_TABLET | Freq: Every day | ORAL | 1 refills | Status: DC

## 2022-04-05 NOTE — Progress Notes (Unsigned)
Patient ID: Mary Ferguson, female  DOB: May 12, 1951, 71 y.o.   MRN: [id number] Patient Care Team    Relationship Specialty Notifications Start End  Ma Hillock, DO PCP - General Family Medicine  03/21/21   Sanda Klein, MD PCP - Cardiology Cardiology  06/25/21   Sanda Klein, MD Consulting Physician Cardiology  03/21/21   Monna Fam, MD Consulting Physician Ophthalmology  03/21/21   Remo Lipps, MD Referring Physician Surgery  03/21/21   Boyd Kerbs, MD Referring Physician Neurology  03/21/21   Troy Sine, MD Consulting Physician Cardiology  03/21/21   Cassell Clement., MD Physician Assistant Sports Medicine  03/21/21   Rachelle Hora, MD Referring Physician Dermatology  03/21/21   Lourdes Sledge, MD Referring Physician Pulmonary Disease  03/21/21     Chief Complaint  Patient presents with   Diabetes    [facility]; pt is fasting    Subjective: Mary Ferguson is a 71 y.o.  female present for follow-up on new med start Chronic generalized abdominal pain/Chronic pain syndrome/Neuropathy/Paresthesia of bilateral legs/Compression fracture of L1 vertebra, initial encounter (HCC)/ Lumbar radiculopathy Patient has noticed an improvement in her neuropathy with ***amitriptyline 25 mg nightly, but notes she still has significant discomfort in her legs, especially when laying flat.  She denies oversedation with the amitriptyline 25 mg nightly. He does notice swelling in her feet and discoloration.  She has multiple varicosities. Prior note: Patient has an extensive chronic pain history.  Per her report she has been on very strong high-dose narcotics in the past, and her pain persisted.  She reports a strong family history of substance abuse, and is not interested in narcotics.  She currently is not on any type of medications to help with her discomfort.    B12 deficiency and Vitamin D deficiency She reports compliance with B12 and vitamin D supplementation.  She has also  started the 4000 units of vitamin D daily.  She has noticed a little increase in energy, but not as much as she would hope.  Type II diabetes mellitus with manifestations [facility]) Patient reports she has a history of diabetes, but has never needed to start medication.  A1c's have been no higher than 6.6 per EMR review in the past. Patient denies dizziness, hyperglycemic or hypoglycemic events. Patient denies nonhealing wounds of feet.  She has chronic neuropathy.   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  She is on lifelong anticoagulation therapy.  Uncertain hematological work-up in the past.  Essential hypertension/Dyslipidemia, goal LDL below 70/CAD S/P PCI/History of non-ST elevation myocardial infarction (NSTEMI)//Morbid obesity (HCC)/Coronary artery disease involving native coronary artery of native heart with angina pectoris (HCC)/Aortic atherosclerosis ([place]) Managed by cardiology   Chronic bronchitis, unspecified chronic bronchitis type (HCC)/Moderate persistent asthma, unspecified whether complicated/Nocturnal oxygen desaturation/Obstructive sleep apnea (adult)  Managed by pulmonology      No data to display             No data to display                 10/19/2021    3:19 PM 03/21/2021    2:19 PM  [place] in the past year? 1 1  Number falls in past yr: 1 1  Injury with Fall? 0 0  Risk for fall due to : History of fall(s) History of fall(s)  Follow up Falls evaluation completed Falls evaluation completed  Immunization History  Administered Date(s) Administered   Fluad Quad(high Dose 65+) 05/08/2021   Influenza, Seasonal, Injecte, Preservative Fre 06/22/2015   Influenza,inj,quad, With Preservative 07/05/2016   Influenza-Unspecified 05/10/2013, 05/05/2014   [place] SARS COV-2 Pediatric Vaccination 59mo to <671yr12/18/2021, 04/02/2021   Moderna Sars-Covid-2  Vaccination 11/04/2019, 12/02/2019   Pneumococcal Conjugate-13 08/26/2004    No results found.  Past Medical History:  Diagnosis Date   Acute and chronic respiratory failure with hypoxia ([place]/08/2017   Asthma    Blood in stool    Chicken pox    Chronic generalized abdominal pain    Chronic pain    CKD (chronic kidney disease), stage III (HCC)    Colon polyps    COPD (chronic obstructive pulmonary disease) ([place]   COVID-19 2020   Depression    Diverticulitis    Very severe.  Has had 2 bowel resections and temporary colostomy in the past.   Fecal incontinence    Frequent headaches    GERD (gastroesophageal reflux disease)    Heart attack ([place]   Heart disease    History of blood transfusion    Hyperlipemia    Hypertension    Incontinence of feces 12/11/2016   Morbid obesity ([facility]   Pneumonia due to infectious organism 01/12/2018   Postmenopausal 03/21/2021   PTSD (post-traumatic stress disorder)    Recurrent deep vein thrombosis (DVT) (HCC)    x6   Urinary incontinence    Allergies  Allergen Reactions   Rofecoxib Anaphylaxis    VIOXX   Tetracyclines & Related Nausea And Vomiting   Metformin And Related     ibs   Benadryl Allergy [Diphenhydramine Hcl] Other (See Comments)    Depressed respirations   Cephalexin Rash   Gatifloxacin Other (See Comments)    Confusion Very low blood pressure [place]   Past Surgical History:  Procedure Laterality Date   ABDOMINAL HYSTERECTOMY  1995   APPENDECTOMY     CATARACT EXTRACTION Bilateral    CESAREAN SECTION  1984   CHOLECYSTECTOMY  2016   COLOSTOMY     and reversal   CORONARY STENT INTERVENTION N/A 06/18/2019   Procedure: CORONARY STENT INTERVENTION;  Surgeon: ArWellington HampshireMD;  [facility];  Service: Cardiovascular;  Laterality: N/A;   [place] 2016 (2016 ventral w/ incarceration) 33 surgeries   HYSTEROSCOPY WITH D & C     13   LAPAROSCOPIC  LYSIS OF ADHESIONS     x2   LEFT HEART CATH AND CORONARY ANGIOGRAPHY N/A 06/18/2019   Procedure: LEFT HEART CATH AND CORONARY ANGIOGRAPHY;  Surgeon: ArWellington HampshireMD;  [facility];  Service: Cardiovascular;  Laterality: N/A;   LEFT HEART CATH AND CORONARY ANGIOGRAPHY N/A 11/30/2019   Procedure: LEFT HEART CATH AND CORONARY ANGIOGRAPHY;  Surgeon: KeTroy SineMD;  [facility];  Service: Cardiovascular;  Laterality: N/A;   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   TONSILLECTOMY  1959   VEIN SURGERY     laser x2   Family History  Problem Relation Age of Onset   Depression Mother    Drug abuse Mother    Mental illness Mother    Learning disabilities Mother    Miscarriages / [place]    Alcohol abuse Mother    Mental illness Father    Depression Father    Heart disease  Father    Drug abuse Sister    Depression Sister    Asthma Sister    Alcohol abuse Sister    Early death Sister    Mental illness Son    Hypertension Son    Drug abuse Son    Depression Son    Asthma Son    Alcohol abuse Son    Clotting disorder Son    Heart disease Maternal Grandmother    Diabetes Maternal Grandmother    Arthritis Maternal Grandmother    COPD Maternal Grandmother    Parkinson's disease Maternal Grandmother    Heart disease Maternal Grandfather    Heart attack Maternal Grandfather    Early death Maternal Grandfather    COPD Maternal Grandfather    Heart disease Paternal Grandmother    Heart attack Paternal Grandmother    Depression Paternal Grandmother    Arthritis Paternal Grandmother    Breast cancer Paternal Grandmother    Heart disease Paternal Grandfather    Heart attack Paternal Grandfather    Early death Paternal Grandfather    COPD Paternal Grandfather    Mental illness Half-Brother    Learning disabilities Half-Brother    Hypertension Half-Brother    Hyperlipidemia Half-Brother    Drug abuse Half-Brother     Heart disease Half-Brother    Diabetes Half-Brother    Depression Half-Brother    Asthma Half-Brother    Alcohol abuse Half-Brother    Arthritis Half-Brother    Kidney disease Half-Brother    Testicular cancer Half-Brother    Diabetes Half-Sister    Mental illness Half-Sister    Learning disabilities Half-Sister    Drug abuse Half-Sister    Depression Half-Sister    Alcohol abuse Half-Sister    Thyroid cancer Half-Sister    Social History   Social History Narrative   Marital status/children/pets:    Education/employment: retired     - wears seatbelt: Yes     - Feels safe in their relationships: Yes       Allergies as of 04/05/2022       Reactions   Rofecoxib Anaphylaxis   VIOXX   Tetracyclines & Related Nausea And Vomiting   Metformin And Related    ibs   Benadryl Allergy [diphenhydramine Hcl] Other (See Comments)   Depressed respirations   Cephalexin Rash   Gatifloxacin Other (See Comments)   Confusion Very low blood pressure [place]        Medication List        Accurate as of April 05, 2022  4:09 PM. If you have any questions, ask your nurse or doctor.          STOP taking these medications    cyanocobalamin 500 MCG tablet Commonly known as: VITAMIN B12 Stopped by: Howard Pouch, DO   diphenoxylate-atropine 2.5-0.025 MG tablet Commonly known as: LOMOTIL Stopped by: Howard Pouch, DO   fluticasone 27.5 MCG/SPRAY nasal spray Commonly known as: VERAMYST Stopped by: Howard Pouch, DO   psyllium 28 % packet Commonly known as: METAMUCIL SMOOTH TEXTURE Stopped by: Howard Pouch, DO   Spacer/Aero-Holding Chambers Kerrin Mo Stopped by: Howard Pouch, DO       TAKE these medications    acetaminophen 500 MG tablet Commonly known as: TYLENOL Take 500 mg by mouth every 6 (six) hours as needed for mild pain or moderate pain.   albuterol 108 (90 Base) MCG/ACT inhaler Commonly known as: VENTOLIN HFA Inhale 2 puffs into the lungs every 4 (four) hours as  needed for wheezing or shortness  of breath.   amitriptyline 50 MG tablet Commonly known as: ELAVIL Take 1 tablet (50 mg total) by mouth at bedtime.   amLODipine 2.5 MG tablet Commonly known as: NORVASC Take 1 tablet (2.5 mg total) by mouth daily.   atorvastatin 80 MG tablet Commonly known as: LIPITOR Take 1 tablet (80 mg total) by mouth every evening.   budesonide-formoterol 80-4.5 MCG/ACT inhaler Commonly known as: Symbicort Inhale 2 puffs into the lungs 2 (two) times daily.   carvedilol 6.25 MG tablet Commonly known as: COREG TAKE 1 TABLET BY MOUTH IN THE MORNING AND 2 TABLETS IN THE EVENING   clobetasol ointment 0.05 % Commonly known as: TEMOVATE Apply 1 application topically daily as needed (Knots on face).   dapagliflozin propanediol 5 MG Tabs tablet Commonly known as: Farxiga Take 1 tablet (5 mg total) by mouth daily before breakfast.   dicyclomine 20 MG tablet Commonly known as: BENTYL Take 1 tablet (20 mg total) by mouth 4 (four) times daily as needed for spasms.   famotidine 40 MG tablet Commonly known as: PEPCID Take 1 tablet (40 mg total) by mouth at bedtime.   hydrOXYzine 25 MG tablet Commonly known as: ATARAX Take 1 tablet (25 mg total) by mouth every 8 (eight) hours as needed for itching.   ipratropium-albuterol 0.5-2.5 (3) MG/3ML Soln Commonly known as: DUONEB Take 3 mLs by nebulization every 4 (four) hours as needed.   isosorbide mononitrate 60 MG 24 hr tablet Commonly known as: IMDUR TAKE 1 TABLET(60 MG) BY MOUTH DAILY   nitroGLYCERIN 0.4 MG SL tablet Commonly known as: NITROSTAT PLACE 1 TABLET UNDER THE TONGUE EVERY 5 MINUTES FOR 3 DOSES AS NEEDED FOR CHEST PAIN   Vitamin D3 50 MCG (2000 UT) capsule Take 2 capsules (4,000 Units total) by mouth daily.   Xarelto 20 MG Tabs tablet Generic drug: rivaroxaban TAKE 1 TABLET(20 MG) BY MOUTH EVERY EVENING        All past medical history, surgical history, allergies, family history,  immunizations andmedications were updated in the EMR today and reviewed under the history and medication portions of their EMR.         ROS: 14 pt review of systems performed and negative (unless mentioned in an HPI)  Objective: BP 95/67   Pulse 91   Temp 98 F (36.7 C) (Oral)   Ht 5' 2"  (1.575 m)   Wt 254 lb (115.2 kg)   SpO2 95%   BMI 46.46 kg/m  Physical Exam Vitals and nursing note reviewed.  Constitutional:      General: She is not in acute distress.    Appearance: Normal appearance. She is obese. She is not ill-appearing, toxic-appearing or diaphoretic.  HENT:     Head: Normocephalic and atraumatic.  Eyes:     General: No scleral icterus.       Right eye: No discharge.        Left eye: No discharge.     Extraocular Movements: Extraocular movements intact.     Conjunctiva/sclera: Conjunctivae normal.     Pupils: Pupils are equal, round, and reactive to light.  Cardiovascular:     Rate and Rhythm: Normal rate and regular rhythm.     Heart sounds: No murmur heard.    No friction rub. No gallop.  Pulmonary:     Effort: Pulmonary effort is normal. No respiratory distress.     Breath sounds: Normal breath sounds. No wheezing, rhonchi or rales.  Musculoskeletal:        General:  Swelling present.     Cervical back: Neck supple. No tenderness.     Right lower leg: No edema.     Left lower leg: No edema.  Lymphadenopathy:     Cervical: No cervical adenopathy.  Skin:    General: Skin is warm and dry.     Capillary Refill: Capillary refill takes more than 3 seconds.     Coloration: Skin is not jaundiced or pale.     Findings: No erythema or rash.     Comments: Lower extremity varicosities veins.  Discoloration bilateral lower extremity.  Neurological:     Mental Status: She is alert and oriented to person, place, and time. Mental status is at baseline.     Sensory: Sensory deficit present.     Motor: No weakness.     Gait: Gait normal.  Psychiatric:        Mood and  Affect: Mood normal.        Behavior: Behavior normal.        Thought Content: Thought content normal.        Judgment: Judgment normal.       Assessment/plan: Mary Ferguson is a 71 y.o. female present for  Chronic pain syndrome/Neuropathy/Paresthesia of bilateral legs/Compression fracture of L1 vertebra, initial encounter (HCC)/ Lumbar radiculopathy/IBS-like pain -She has reportedly been on many pain medicines in the past and did not find them helpful.  She is not interested in narcotics.  She has history of compression fraction and lumbar radiculopathy (L4-L5 region). I believe her pain is multifactorial.  She recently has evidence of orthopedic causes and her chronic abdominal pain secondary to severe diverticulosis and multiple abdominal surgeries.  There also may be a pain syndrome component given her past history and history of depression. -Consider Cymbalta-may be an option later. -Consider Lyrica-also may be an option later.   -increase Elavil 50 mg nightly.  She understands not stop abruptly because she does not think it is working.  Hopefully this will help with neuropathic pain, depression and she may benefit with GI complaints. -NSAIDs contraindicated with her cardiac history and Xarelto use. -She is currently being managed with lower back pain by Ortho team with injections. -Referral to vascular and vein for eval with history of worsening paresthesias when laying flat and decreased cap refill/discoloration of lower extremities.  B12 deficiency and Vitamin D deficiency Continue oral supplements . She stopped b12.  -Vitamin D collected today.  Type II diabetes mellitus with manifestations (HCC) A1c increased to 7.2 today. Farxiga 5 mg daily prescribed.> pt never start bc too expensive   - pt has IBS already, metformin not ideal  Patient is to focus on her diet,.  Follow a diabetic diet. 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 Cbc and cmp collected   Essential hypertension/Dyslipidemia, goal LDL below 70/CAD S/P PCI/History of non-ST elevation myocardial infarction (NSTEMI)//Morbid obesity (HCC)/Coronary artery disease involving native coronary artery of native heart with angina pectoris (HCC)/Aortic atherosclerosis (HCC) Blood pressure is stable. Medications are managed by cardiology-amlodipine, atorvastatin, carvedilol, Imdur, nitro, Xarelto Cbc, cmp collected  Chronic bronchitis, unspecified chronic bronchitis type (HCC)/Moderate persistent asthma, unspecified whether complicated/Nocturnal oxygen desaturation/Obstructive sleep apnea (adult)  Patient is having fatigue with reported history of COPD, asthma, OSA and use of oxygen at night.   - Ambulatory referral to Pulmonology placed 02/2021    Return for will discuss f/u on call  back.  Orders Placed This Encounter  Procedures   Hemoglobin A1c   Vitamin D (25 hydroxy)   Comp Met (CMET)   CBC     No orders of the defined types were placed in this encounter.    Referral Orders  No referral(s) requested today      > 60 Minutes was dedicated to this patient's encounter to include review of chart, face-to-face time with patient and post-visit work- which include documentation and prescribing medications and/or ordering test when necessary.    Note is dictated utilizing voice recognition software. Although note has been proof read prior to signing, occasional typographical errors still can be missed. If any questions arise, please do not hesitate to call for verification.  Electronically signed by: Howard Pouch, DO [place]

## 2022-04-05 NOTE — Patient Instructions (Addendum)
[email or web address]://[email or web address].[facility].com/diabetes/help-[email or web address]/[email or web address]        Great to see you today.  I have refilled the medication(s) we provide.   If labs were collected, we will inform you of lab results once received either by echart message or telephone call.   - echart message- for normal results that have been seen by the patient already.   - telephone call: abnormal results or if patient has not viewed results in their echart.  We will discuss follow up when we call you with results. Follow up timing will depend on lab results.

## 2022-04-06 ENCOUNTER — Ambulatory Visit (INDEPENDENT_AMBULATORY_CARE_PROVIDER_SITE_OTHER): Payer: Medicare Other

## 2022-04-06 DIAGNOSIS — Z13820 Encounter for screening for osteoporosis: Secondary | ICD-10-CM

## 2022-04-06 DIAGNOSIS — Z0000 Encounter for general adult medical examination without abnormal findings: Secondary | ICD-10-CM | POA: Diagnosis not present

## 2022-04-06 DIAGNOSIS — Z1231 Encounter for screening mammogram for malignant neoplasm of breast: Secondary | ICD-10-CM

## 2022-04-06 DIAGNOSIS — Z780 Asymptomatic menopausal state: Secondary | ICD-10-CM | POA: Diagnosis not present

## 2022-04-06 LAB — COMPREHENSIVE METABOLIC PANEL
AG Ratio: 1.4 (calc) (ref 1.0–2.5)
ALT: 12 U/L (ref 6–29)
AST: 14 U/L (ref 10–35)
Albumin: 3.8 g/dL (ref 3.6–5.1)
Alkaline phosphatase (APISO): 102 U/L (ref 37–153)
BUN/Creatinine Ratio: 19 (calc) (ref 6–22)
BUN: 25 mg/dL (ref 7–25)
CO2: 23 mmol/L (ref 20–32)
Calcium: 10.1 mg/dL (ref 8.6–10.4)
Chloride: 107 mmol/L (ref 98–110)
Creat: 1.35 mg/dL — ABNORMAL HIGH (ref 0.60–1.00)
Globulin: 2.7 g/dL (calc) (ref 1.9–3.7)
Glucose, Bld: 129 mg/dL — ABNORMAL HIGH (ref 65–99)
Potassium: 4.7 mmol/L (ref 3.5–5.3)
Sodium: 140 mmol/L (ref 135–146)
Total Bilirubin: 0.7 mg/dL (ref 0.2–1.2)
Total Protein: 6.5 g/dL (ref 6.1–8.1)

## 2022-04-06 LAB — CBC
HCT: 43.1 % (ref 35.0–45.0)
Hemoglobin: 14.1 g/dL (ref 11.7–15.5)
MCH: 26.4 pg — ABNORMAL LOW (ref 27.0–33.0)
MCHC: 32.7 g/dL (ref 32.0–36.0)
MCV: 80.6 fL (ref 80.0–100.0)
MPV: 9.8 fL (ref 7.5–12.5)
Platelets: 284 10*3/uL (ref 140–400)
RBC: 5.35 10*6/uL — ABNORMAL HIGH (ref 3.80–5.10)
RDW: 16.8 % — ABNORMAL HIGH (ref 11.0–15.0)
WBC: 8 10*3/uL (ref 3.8–10.8)

## 2022-04-06 LAB — HEMOGLOBIN A1C
Hgb A1c MFr Bld: 7.1 % of total Hgb — ABNORMAL HIGH (ref <5.7)
Mean Plasma Glucose: 157 mg/dL
eAG (mmol/L): 8.7 mmol/L

## 2022-04-06 LAB — VITAMIN D 25 HYDROXY (VIT D DEFICIENCY, FRACTURES): Vit D, 25-Hydroxy: 25 ng/mL — ABNORMAL LOW (ref 30–100)
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
## 2022-04-08 ENCOUNTER — Encounter: Payer: Self-pay | Admitting: Family Medicine

## 2022-04-22 ENCOUNTER — Telehealth (HOSPITAL_BASED_OUTPATIENT_CLINIC_OR_DEPARTMENT_OTHER): Payer: Self-pay

## 2022-05-14 ENCOUNTER — Inpatient Hospital Stay (HOSPITAL_BASED_OUTPATIENT_CLINIC_OR_DEPARTMENT_OTHER): Admission: RE | Admit: 2022-05-14 | Payer: Medicare Other | Source: Ambulatory Visit

## 2022-05-14 ENCOUNTER — Other Ambulatory Visit (HOSPITAL_BASED_OUTPATIENT_CLINIC_OR_DEPARTMENT_OTHER): Payer: Medicare Other

## 2022-06-26 ENCOUNTER — Telehealth: Payer: Self-pay | Admitting: Cardiovascular Disease

## 2022-06-26 ENCOUNTER — Telehealth: Payer: Self-pay | Admitting: Family Medicine

## 2022-06-26 DIAGNOSIS — Z86718 Personal history of other venous thrombosis and embolism: Secondary | ICD-10-CM

## 2022-06-26 MED ORDER — RIVAROXABAN 20 MG PO TABS: ORAL_TABLET | ORAL | 1 refills | Status: DC

## 2022-06-26 NOTE — Telephone Encounter (Signed)
Spoke with patient regarding results/recommendations.  

## 2022-06-26 NOTE — Telephone Encounter (Signed)
 *  STAT* If patient is at the pharmacy, call can be transferred to refill team.   1. Which medications need to be refilled? (please list name of each medication and dose if known) XARELTO 20 MG TABS tablet  2. Which pharmacy/location (including street and city if local pharmacy) is medication to be sent to? [id number] - [place], [place] - [address]  3. Do they need a 30 day or 90 day supply? 90 days  Pt is out of meds need  refill today

## 2022-06-26 NOTE — Telephone Encounter (Signed)
Pt called and is needing Famotidine and Amitriptyline called into the pharmacy. She reports that the pharmacy is saying its canceled or unable to be refilled. Patients pharmacy has been confirmed. She is scheduled for an appt Nov 6.

## 2022-06-26 NOTE — Telephone Encounter (Signed)
Prescription refill request for Xarelto received.  Indication: Recurrent DVT Last office visit: 04/04/22 (Croitoru) Weight: 115.2kg Age: 71 Scr: 1.35 (04/05/22) CrCl: 70.13ml/min  Appropriate dose and refill sent to requested pharmacy.

## 2022-07-01 ENCOUNTER — Encounter: Payer: Self-pay | Admitting: Family Medicine

## 2022-07-01 ENCOUNTER — Ambulatory Visit (INDEPENDENT_AMBULATORY_CARE_PROVIDER_SITE_OTHER): Payer: Medicare Other | Admitting: Family Medicine

## 2022-07-01 VITALS — BP 107/73 | HR 97 | Temp 97.6°F | Wt 242.8 lb

## 2022-07-01 DIAGNOSIS — E118 Type 2 diabetes mellitus with unspecified complications: Secondary | ICD-10-CM

## 2022-07-01 DIAGNOSIS — J4540 Moderate persistent asthma, uncomplicated: Secondary | ICD-10-CM | POA: Diagnosis not present

## 2022-07-01 DIAGNOSIS — J42 Unspecified chronic bronchitis: Secondary | ICD-10-CM | POA: Diagnosis not present

## 2022-07-01 DIAGNOSIS — J3489 Other specified disorders of nose and nasal sinuses: Secondary | ICD-10-CM | POA: Diagnosis not present

## 2022-07-01 DIAGNOSIS — R21 Rash and other nonspecific skin eruption: Secondary | ICD-10-CM

## 2022-07-01 LAB — POC COVID19 BINAXNOW: SARS Coronavirus 2 Ag: NEGATIVE

## 2022-07-01 LAB — POCT INFLUENZA A/B
Influenza A, POC: NEGATIVE
Influenza B, POC: NEGATIVE

## 2022-07-01 MED ORDER — ONDANSETRON HCL 4 MG PO TABS: 4.0000 mg | ORAL_TABLET | Freq: Three times a day (TID) | ORAL | 0 refills | Status: DC | PRN

## 2022-07-01 MED ORDER — PREDNISONE 20 MG PO TABS: 40.0000 mg | ORAL_TABLET | Freq: Every day | ORAL | 0 refills | Status: DC

## 2022-07-01 MED ORDER — CLOTRIMAZOLE 1 % EX CREA: 1.0000 | TOPICAL_CREAM | Freq: Two times a day (BID) | CUTANEOUS | 2 refills | Status: DC

## 2022-07-01 MED ORDER — DOXYCYCLINE HYCLATE 100 MG PO TABS: 100.0000 mg | ORAL_TABLET | Freq: Two times a day (BID) | ORAL | 0 refills | Status: DC

## 2022-07-01 NOTE — Progress Notes (Unsigned)
Mary Ferguson , 1951/07/19, 71 y.o., female MRN: [id number] Patient Care Team    Relationship Specialty Notifications Start End  Ma Hillock, DO PCP - General Family Medicine  03/21/21   Sanda Klein, MD PCP - Cardiology Cardiology  06/25/21   Sanda Klein, MD Consulting Physician Cardiology  03/21/21   Monna Fam, MD Consulting Physician Ophthalmology  03/21/21   Remo Lipps, MD Referring Physician Surgery  03/21/21   Boyd Kerbs, MD Referring Physician Neurology  03/21/21   Troy Sine, MD Consulting Physician Cardiology  03/21/21   Cassell Clement., MD Physician Assistant Sports Medicine  03/21/21   Rachelle Hora, MD Referring Physician Dermatology  03/21/21   Lourdes Sledge, MD Referring Physician Pulmonary Disease  03/21/21     Chief Complaint  Patient presents with   Sinus Problem     Subjective: Pt presents for an OV with complaints of   Sinus pain: Patient reports her sinuses have been bothering her for a few weeks.  Over the last week she has had more sinus pressure pain, nasal congestion and headaches.  She denies fevers or chills.  She has been using her Flonase.  She reports she has been using her Symbicort because she is in the donut hole.  She has some mild cough but reports no obvious wheezing.     04/09/2022    8:49 AM  Depression screen PHQ 2/9  Decreased Interest 0  Down, Depressed, Hopeless 0  PHQ - 2 Score 0    Allergies  Allergen Reactions   Rofecoxib Anaphylaxis    VIOXX   Metformin And Related     ibs   Benadryl Allergy [Diphenhydramine Hcl] Other (See Comments)    Depressed respirations   Cephalexin Rash   Gatifloxacin Other (See Comments)    Confusion Very low blood pressure T[place]inn   Social History   Social History Narrative   Marital status/children/pets:    Education/employment: retired     - wears seatbelt: Yes     - Feels safe in their relationships: Yes      Past Medical History:   Diagnosis Date   Acute and chronic respiratory failure with hypoxia ([place]) 01/24/2018   Asthma    Blood in stool    Chicken pox    Chronic generalized abdominal pain    Chronic pain    CKD (chronic kidney disease), stage III (HCC)    Colon polyps    COPD (chronic obstructive pulmonary disease) ([place])    COVID-19 2020   Depression    Diverticulitis    Very severe.  Has had 2 bowel resections and temporary colostomy in the past.   Fecal incontinence    Frequent headaches    GERD (gastroesophageal reflux disease)    Heart attack ([place])    Heart disease    History of blood transfusion    Hyperlipemia    Hypertension    Incontinence of feces 12/11/2016   Morbid obesity ([place])    Pneumonia due to infectious organism 01/12/2018   Postmenopausal 03/21/2021   PTSD (post-traumatic stress disorder)    Recurrent deep vein thrombosis (DVT) ([place])    x6   Urinary incontinence    Past Surgical History:  Procedure Laterality Date   ABDOMINAL HYSTERECTOMY  1995   APPENDECTOMY     CATARACT EXTRACTION Bilateral    CESAREAN SECTION  1984   CHOLECYSTECTOMY  2016   COLOSTOMY     and reversal   CORONARY STENT INTERVENTION  N/A 06/18/2019   Procedure: CORONARY STENT INTERVENTION;  Surgeon: Wellington Hampshire, MD;  Location: [facility];  Service: Cardiovascular;  Laterality: N/A;   [place] and 2016 (2016 ventral w/ incarceration) 33 surgeries   HYSTEROSCOPY WITH D & C     14   LAPAROSCOPIC LYSIS OF ADHESIONS     x2   LEFT HEART CATH AND CORONARY ANGIOGRAPHY N/A 06/18/2019   Procedure: LEFT HEART CATH AND CORONARY ANGIOGRAPHY;  Surgeon: Wellington Hampshire, MD;  Location: [facility];  Service: Cardiovascular;  Laterality: N/A;   LEFT HEART CATH AND CORONARY ANGIOGRAPHY N/A 11/30/2019   Procedure: LEFT HEART CATH AND CORONARY ANGIOGRAPHY;  Surgeon: Troy Sine, MD;  Location: [facility];  Service: Cardiovascular;  Laterality:  N/A;   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   TONSILLECTOMY  1959   VEIN SURGERY     laser x2   Family History  Problem Relation Age of Onset   Depression Mother    Drug abuse Mother    Mental illness Mother    Learning disabilities Mother    Miscarriages / [place] Mother    Alcohol abuse Mother    Mental illness Father    Depression Father    Heart disease Father    Drug abuse Sister    Depression Sister    Asthma Sister    Alcohol abuse Sister    Early death Sister    Mental illness Son    Hypertension Son    Drug abuse Son    Depression Son    Asthma Son    Alcohol abuse Son    Clotting disorder Son    Heart disease Maternal Grandmother    Diabetes Maternal Grandmother    Arthritis Maternal Grandmother    COPD Maternal Grandmother    Parkinson's disease Maternal Grandmother    Heart disease Maternal Grandfather    Heart attack Maternal Grandfather    Early death Maternal Grandfather    COPD Maternal Grandfather    Heart disease Paternal Grandmother    Heart attack Paternal Grandmother    Depression Paternal Grandmother    Arthritis Paternal Grandmother    Breast cancer Paternal Grandmother    Heart disease Paternal Grandfather    Heart attack Paternal Grandfather    Early death Paternal Grandfather    COPD Paternal Grandfather    Mental illness Half-Brother    Learning disabilities Half-Brother    Hypertension Half-Brother    Hyperlipidemia Half-Brother    Drug abuse Half-Brother    Heart disease Half-Brother    Diabetes Half-Brother    Depression Half-Brother    Asthma Half-Brother    Alcohol abuse Half-Brother    Arthritis Half-Brother    Kidney disease Half-Brother    Testicular cancer Half-Brother    Diabetes Half-Sister    Mental illness Half-Sister    Learning disabilities Half-Sister    Drug abuse Half-Sister    Depression Half-Sister    Alcohol abuse Half-Sister    Thyroid cancer Half-Sister    Allergies as of 07/01/2022        Reactions   Rofecoxib Anaphylaxis   VIOXX   Metformin And Related    ibs   Benadryl Allergy [diphenhydramine Hcl] Other (See Comments)   Depressed respirations   Cephalexin Rash   Gatifloxacin Other (See Comments)   Confusion Very low blood pressure [place]  Medication List        Accurate as of July 01, 2022 11:59 PM. If you have any questions, ask your nurse or doctor.          STOP taking these medications    clobetasol ointment 0.05 % Commonly known as: TEMOVATE Stopped by: Howard Pouch, DO       TAKE these medications    acetaminophen 500 MG tablet Commonly known as: TYLENOL Take 500 mg by mouth every 6 (six) hours as needed for mild pain or moderate pain.   albuterol 108 (90 Base) MCG/ACT inhaler Commonly known as: VENTOLIN HFA Inhale 2 puffs into the lungs every 4 (four) hours as needed for wheezing or shortness of breath.   amitriptyline 50 MG tablet Commonly known as: ELAVIL Take 1 tablet (50 mg total) by mouth at bedtime.   amLODipine 2.5 MG tablet Commonly known as: NORVASC Take 1 tablet (2.5 mg total) by mouth daily.   atorvastatin 80 MG tablet Commonly known as: LIPITOR Take 1 tablet (80 mg total) by mouth every evening.   budesonide-formoterol 80-4.5 MCG/ACT inhaler Commonly known as: Symbicort Inhale 2 puffs into the lungs 2 (two) times daily.   carvedilol 6.25 MG tablet Commonly known as: COREG TAKE 1 TABLET BY MOUTH IN THE MORNING AND 2 TABLETS IN THE EVENING   clotrimazole 1 % cream Commonly known as: Clotrimazole Anti-Fungal Apply 1 Application topically 2 (two) times daily. Started by: Howard Pouch, DO   dicyclomine 20 MG tablet Commonly known as: BENTYL Take 1 tablet (20 mg total) by mouth 4 (four) times daily as needed for spasms.   doxycycline 100 MG tablet Commonly known as: VIBRA-TABS Take 1 tablet (100 mg total) by mouth 2 (two) times daily. Started by: Howard Pouch, DO   famotidine 40 MG  tablet Commonly known as: PEPCID Take 1 tablet (40 mg total) by mouth at bedtime.   hydrOXYzine 25 MG tablet Commonly known as: ATARAX Take 1 tablet (25 mg total) by mouth every 8 (eight) hours as needed for itching.   ipratropium-albuterol 0.5-2.5 (3) MG/3ML Soln Commonly known as: DUONEB Take 3 mLs by nebulization every 4 (four) hours as needed.   isosorbide mononitrate 60 MG 24 hr tablet Commonly known as: IMDUR TAKE 1 TABLET(60 MG) BY MOUTH DAILY   nitroGLYCERIN 0.4 MG SL tablet Commonly known as: NITROSTAT PLACE 1 TABLET UNDER THE TONGUE EVERY 5 MINUTES FOR 3 DOSES AS NEEDED FOR CHEST PAIN   ondansetron 4 MG tablet Commonly known as: ZOFRAN Take 1 tablet (4 mg total) by mouth every 8 (eight) hours as needed for nausea or vomiting. Started by: Howard Pouch, DO   predniSONE 20 MG tablet Commonly known as: DELTASONE Take 2 tablets (40 mg total) by mouth daily with breakfast. Started by: Howard Pouch, DO   rivaroxaban 20 MG Tabs tablet Commonly known as: Xarelto TAKE 1 TABLET(20 MG) BY MOUTH EVERY EVENING   Vitamin D3 50 MCG (2000 UT) capsule Take 2 capsules (4,000 Units total) by mouth daily.        All past medical history, surgical history, allergies, family history, immunizations andmedications were updated in the EMR today and reviewed under the history and medication portions of their EMR.     ROS Negative, with the exception of above mentioned in HPI   Objective:  BP 107/73   Pulse 97   Temp 97.6 F (36.4 C)   Wt 242 lb 12.8 oz (110.1 kg)   SpO2 96%   BMI 44.41  kg/m  Body mass index is 44.41 kg/m. Physical Exam HENT:     Nose: Congestion and rhinorrhea present.     Mouth/Throat:     Mouth: Mucous membranes are moist.     Pharynx: Posterior oropharyngeal erythema present. No oropharyngeal exudate.  Eyes:     General: No scleral icterus.       Right eye: No discharge.        Left eye: No discharge.     Extraocular Movements: Extraocular  movements intact.     Conjunctiva/sclera: Conjunctivae normal.     Pupils: Pupils are equal, round, and reactive to light.  Cardiovascular:     Rate and Rhythm: Normal rate and regular rhythm.  Pulmonary:     Effort: Pulmonary effort is normal. No respiratory distress.     Breath sounds: Normal breath sounds. No wheezing, rhonchi or rales.  Musculoskeletal:     Cervical back: Neck supple.  Skin:    Findings: Rash present.     Comments: Feet: Large first toe left foot with thickened nail deformity.  Right large first toe and fourth toe with dried blood present.  No erythema, no drainage.  Dried blood from patient clipping nails    Diabetic Foot Exam - Simple   Simple Foot Form  07/01/2022 12:11 PM  Visual Inspection See comments: Yes Sensation Testing Intact to touch and monofilament testing bilaterally: Yes Pulse Check Posterior Tibialis and Dorsalis pulse intact bilaterally: Yes Comments Erythema or drainage.  Dried blood on 2 toes of right foot where patient has broken skin with nail clipping. Left large toenail deformity with thickening and yellowing.      No results found. No results found. No results found for this or any previous visit (from the past 24 hour(s)).   Assessment/Plan: Mary Ferguson is a 71 y.o. female present for OV for  Nasal drainage/Moderate persistent asthma, unspecified whether complicated/Chronic bronchitis, unspecified chronic bronchitis type ([place]) Rest, hydrate.  Continue Flonase twice daily as long as can tolerate. Start Allegra plain daily Doxy twice daily prescribed, take until completed.  Prednisone burst prescribed Zofran for nausea  Type II diabetes mellitus with manifestations ([place]) Patient has thickened nails and dried blood on her toes from her attempting to cut her nails on her own.  I have asked her if she is going to trim her nails at home to use a file and not clippers for maintenance and we will refer her to podiatry for ongoing  foot care. - Ambulatory referral to Podiatry Rash -Bilateral mammary fold with rash.  Clotrimazole cream prescribed.  Reviewed expectations re: course of current medical issues. Discussed self-management of symptoms. Outlined signs and symptoms indicating need for more acute intervention. Patient verbalized understanding and all questions were answered. Patient received an After-Visit Summary.    Orders Placed This Encounter  Procedures   Ambulatory referral to Podiatry   POCT Influenza A/B   POC COVID-19 BinaxNow   Meds ordered this encounter  Medications   doxycycline (VIBRA-TABS) 100 MG tablet    Sig: Take 1 tablet (100 mg total) by mouth 2 (two) times daily.    Dispense:  20 tablet    Refill:  0   predniSONE (DELTASONE) 20 MG tablet    Sig: Take 2 tablets (40 mg total) by mouth daily with breakfast.    Dispense:  10 tablet    Refill:  0   ondansetron (ZOFRAN) 4 MG tablet    Sig: Take 1 tablet (4 mg total) by mouth every  8 (eight) hours as needed for nausea or vomiting.    Dispense:  20 tablet    Refill:  0   clotrimazole (CLOTRIMAZOLE ANTI-FUNGAL) 1 % cream    Sig: Apply 1 Application topically 2 (two) times daily.    Dispense:  30 g    Refill:  2   Referral Orders         Ambulatory referral to Podiatry       Note is dictated utilizing voice recognition software. Although note has been proof read prior to signing, occasional typographical errors still can be missed. If any questions arise, please do not hesitate to call for verification.   electronically signed by:  Howard Pouch, DO  [place]

## 2022-07-01 NOTE — Patient Instructions (Addendum)
Return in about 10 weeks (around 09/09/2022), or if symptoms worsen or fail to improve, for Routine chronic condition follow-up.        Great to see you today.  I have refilled the medication(s) we provide.   If labs were collected, we will inform you of lab results once received either by echart message or telephone call.   - echart message- for normal results that have been seen by the patient already.   - telephone call: abnormal results or if patient has not viewed results in their echart.

## 2022-07-20 ENCOUNTER — Emergency Department (HOSPITAL_COMMUNITY): Payer: Medicare Other

## 2022-07-20 ENCOUNTER — Encounter (HOSPITAL_COMMUNITY): Payer: Self-pay | Admitting: Emergency Medicine

## 2022-07-20 ENCOUNTER — Inpatient Hospital Stay (HOSPITAL_COMMUNITY)
Admission: EM | Admit: 2022-07-20 | Discharge: 2022-07-25 | DRG: 682 | Disposition: A | Discharge status: Home Health | Payer: Medicare Other | Attending: Internal Medicine | Admitting: Internal Medicine

## 2022-07-20 ENCOUNTER — Other Ambulatory Visit: Payer: Self-pay

## 2022-07-20 DIAGNOSIS — Z8616 Personal history of COVID-19: Secondary | ICD-10-CM

## 2022-07-20 DIAGNOSIS — E46 Unspecified protein-calorie malnutrition: Secondary | ICD-10-CM | POA: Diagnosis present

## 2022-07-20 DIAGNOSIS — Y92009 Unspecified place in unspecified non-institutional (private) residence as the place of occurrence of the external cause: Secondary | ICD-10-CM

## 2022-07-20 DIAGNOSIS — Z888 Allergy status to other drugs, medicaments and biological substances status: Secondary | ICD-10-CM

## 2022-07-20 DIAGNOSIS — R748 Abnormal levels of other serum enzymes: Secondary | ICD-10-CM | POA: Diagnosis not present

## 2022-07-20 DIAGNOSIS — Y92000 Kitchen of unspecified non-institutional (private) residence as  the place of occurrence of the external cause: Secondary | ICD-10-CM | POA: Diagnosis not present

## 2022-07-20 DIAGNOSIS — F4310 Post-traumatic stress disorder, unspecified: Secondary | ICD-10-CM | POA: Diagnosis present

## 2022-07-20 DIAGNOSIS — Z820 Family history of epilepsy and other diseases of the nervous system: Secondary | ICD-10-CM

## 2022-07-20 DIAGNOSIS — Z90710 Acquired absence of both cervix and uterus: Secondary | ICD-10-CM

## 2022-07-20 DIAGNOSIS — N179 Acute kidney failure, unspecified: Principal | ICD-10-CM | POA: Insufficient documentation

## 2022-07-20 DIAGNOSIS — E872 Acidosis, unspecified: Secondary | ICD-10-CM | POA: Diagnosis present

## 2022-07-20 DIAGNOSIS — Z955 Presence of coronary angioplasty implant and graft: Secondary | ICD-10-CM

## 2022-07-20 DIAGNOSIS — K5792 Diverticulitis of intestine, part unspecified, without perforation or abscess without bleeding: Secondary | ICD-10-CM

## 2022-07-20 DIAGNOSIS — Z79899 Other long term (current) drug therapy: Secondary | ICD-10-CM

## 2022-07-20 DIAGNOSIS — E1122 Type 2 diabetes mellitus with diabetic chronic kidney disease: Secondary | ICD-10-CM | POA: Diagnosis present

## 2022-07-20 DIAGNOSIS — E8809 Other disorders of plasma-protein metabolism, not elsewhere classified: Secondary | ICD-10-CM | POA: Diagnosis present

## 2022-07-20 DIAGNOSIS — Z803 Family history of malignant neoplasm of breast: Secondary | ICD-10-CM

## 2022-07-20 DIAGNOSIS — M6282 Rhabdomyolysis: Secondary | ICD-10-CM | POA: Diagnosis not present

## 2022-07-20 DIAGNOSIS — N1831 Chronic kidney disease, stage 3a: Secondary | ICD-10-CM | POA: Diagnosis present

## 2022-07-20 DIAGNOSIS — E785 Hyperlipidemia, unspecified: Secondary | ICD-10-CM | POA: Diagnosis present

## 2022-07-20 DIAGNOSIS — Z825 Family history of asthma and other chronic lower respiratory diseases: Secondary | ICD-10-CM

## 2022-07-20 DIAGNOSIS — B372 Candidiasis of skin and nail: Secondary | ICD-10-CM | POA: Diagnosis present

## 2022-07-20 DIAGNOSIS — G8929 Other chronic pain: Secondary | ICD-10-CM | POA: Diagnosis present

## 2022-07-20 DIAGNOSIS — Z7952 Long term (current) use of systemic steroids: Secondary | ICD-10-CM

## 2022-07-20 DIAGNOSIS — E876 Hypokalemia: Secondary | ICD-10-CM | POA: Diagnosis present

## 2022-07-20 DIAGNOSIS — E8729 Other acidosis: Secondary | ICD-10-CM | POA: Insufficient documentation

## 2022-07-20 DIAGNOSIS — N189 Chronic kidney disease, unspecified: Secondary | ICD-10-CM | POA: Insufficient documentation

## 2022-07-20 DIAGNOSIS — K219 Gastro-esophageal reflux disease without esophagitis: Secondary | ICD-10-CM | POA: Diagnosis present

## 2022-07-20 DIAGNOSIS — T796XXA Traumatic ischemia of muscle, initial encounter: Secondary | ICD-10-CM | POA: Diagnosis present

## 2022-07-20 DIAGNOSIS — I129 Hypertensive chronic kidney disease with stage 1 through stage 4 chronic kidney disease, or unspecified chronic kidney disease: Secondary | ICD-10-CM | POA: Diagnosis present

## 2022-07-20 DIAGNOSIS — E860 Dehydration: Secondary | ICD-10-CM | POA: Diagnosis present

## 2022-07-20 DIAGNOSIS — I700 Atherosclerosis of aorta: Secondary | ICD-10-CM | POA: Diagnosis present

## 2022-07-20 DIAGNOSIS — K5732 Diverticulitis of large intestine without perforation or abscess without bleeding: Secondary | ICD-10-CM | POA: Diagnosis present

## 2022-07-20 DIAGNOSIS — R531 Weakness: Secondary | ICD-10-CM | POA: Diagnosis present

## 2022-07-20 DIAGNOSIS — I10 Essential (primary) hypertension: Secondary | ICD-10-CM | POA: Diagnosis not present

## 2022-07-20 DIAGNOSIS — Z808 Family history of malignant neoplasm of other organs or systems: Secondary | ICD-10-CM

## 2022-07-20 DIAGNOSIS — Z833 Family history of diabetes mellitus: Secondary | ICD-10-CM

## 2022-07-20 DIAGNOSIS — Z811 Family history of alcohol abuse and dependence: Secondary | ICD-10-CM

## 2022-07-20 DIAGNOSIS — J9621 Acute and chronic respiratory failure with hypoxia: Secondary | ICD-10-CM | POA: Diagnosis present

## 2022-07-20 DIAGNOSIS — R680 Hypothermia, not associated with low environmental temperature: Secondary | ICD-10-CM | POA: Diagnosis present

## 2022-07-20 DIAGNOSIS — Z86718 Personal history of other venous thrombosis and embolism: Secondary | ICD-10-CM

## 2022-07-20 DIAGNOSIS — Z6841 Body Mass Index (BMI) 40.0 and over, adult: Secondary | ICD-10-CM

## 2022-07-20 DIAGNOSIS — I2510 Atherosclerotic heart disease of native coronary artery without angina pectoris: Secondary | ICD-10-CM | POA: Diagnosis present

## 2022-07-20 DIAGNOSIS — Z9049 Acquired absence of other specified parts of digestive tract: Secondary | ICD-10-CM

## 2022-07-20 DIAGNOSIS — F32A Depression, unspecified: Secondary | ICD-10-CM | POA: Diagnosis present

## 2022-07-20 DIAGNOSIS — I252 Old myocardial infarction: Secondary | ICD-10-CM

## 2022-07-20 DIAGNOSIS — E118 Type 2 diabetes mellitus with unspecified complications: Secondary | ICD-10-CM | POA: Diagnosis present

## 2022-07-20 DIAGNOSIS — W1839XA Other fall on same level, initial encounter: Secondary | ICD-10-CM | POA: Diagnosis present

## 2022-07-20 DIAGNOSIS — J4489 Other specified chronic obstructive pulmonary disease: Secondary | ICD-10-CM | POA: Diagnosis present

## 2022-07-20 DIAGNOSIS — M48061 Spinal stenosis, lumbar region without neurogenic claudication: Secondary | ICD-10-CM | POA: Diagnosis present

## 2022-07-20 DIAGNOSIS — Z8249 Family history of ischemic heart disease and other diseases of the circulatory system: Secondary | ICD-10-CM

## 2022-07-20 DIAGNOSIS — I959 Hypotension, unspecified: Secondary | ICD-10-CM | POA: Diagnosis present

## 2022-07-20 DIAGNOSIS — Z881 Allergy status to other antibiotic agents status: Secondary | ICD-10-CM

## 2022-07-20 DIAGNOSIS — K439 Ventral hernia without obstruction or gangrene: Secondary | ICD-10-CM | POA: Diagnosis present

## 2022-07-20 DIAGNOSIS — Z87891 Personal history of nicotine dependence: Secondary | ICD-10-CM

## 2022-07-20 DIAGNOSIS — E1165 Type 2 diabetes mellitus with hyperglycemia: Secondary | ICD-10-CM | POA: Diagnosis present

## 2022-07-20 DIAGNOSIS — Z818 Family history of other mental and behavioral disorders: Secondary | ICD-10-CM

## 2022-07-20 DIAGNOSIS — W19XXXA Unspecified fall, initial encounter: Secondary | ICD-10-CM

## 2022-07-20 DIAGNOSIS — E861 Hypovolemia: Secondary | ICD-10-CM | POA: Diagnosis present

## 2022-07-20 DIAGNOSIS — Z7901 Long term (current) use of anticoagulants: Secondary | ICD-10-CM

## 2022-07-20 DIAGNOSIS — Z832 Family history of diseases of the blood and blood-forming organs and certain disorders involving the immune mechanism: Secondary | ICD-10-CM

## 2022-07-20 HISTORY — DX: Diverticulitis of intestine, part unspecified, without perforation or abscess without bleeding: K57.92

## 2022-07-20 LAB — CBC WITH DIFFERENTIAL/PLATELET
Abs Immature Granulocytes: 0.05 10*3/uL (ref 0.00–0.07)
Basophils Absolute: 0 10*3/uL (ref 0.0–0.1)
Basophils Relative: 0 %
Eosinophils Absolute: 0.2 10*3/uL (ref 0.0–0.5)
Eosinophils Relative: 1 %
HCT: 46 % (ref 36.0–46.0)
Hemoglobin: 15 g/dL (ref 12.0–15.0)
Immature Granulocytes: 0 %
Lymphocytes Relative: 9 %
Lymphs Abs: 1 10*3/uL (ref 0.7–4.0)
MCH: 26.6 pg (ref 26.0–34.0)
MCHC: 32.6 g/dL (ref 30.0–36.0)
MCV: 81.7 fL (ref 80.0–100.0)
Monocytes Absolute: 0.7 10*3/uL (ref 0.1–1.0)
Monocytes Relative: 6 %
Neutro Abs: 9.9 10*3/uL — ABNORMAL HIGH (ref 1.7–7.7)
Neutrophils Relative %: 84 %
Platelets: 280 10*3/uL (ref 150–400)
RBC: 5.63 MIL/uL — ABNORMAL HIGH (ref 3.87–5.11)
RDW: 17 % — ABNORMAL HIGH (ref 11.5–15.5)
WBC: 11.9 10*3/uL — ABNORMAL HIGH (ref 4.0–10.5)
nRBC: 0 % (ref 0.0–0.2)

## 2022-07-20 LAB — COMPREHENSIVE METABOLIC PANEL
ALT: 26 U/L (ref 0–44)
AST: 62 U/L — ABNORMAL HIGH (ref 15–41)
Albumin: 2.7 g/dL — ABNORMAL LOW (ref 3.5–5.0)
Alkaline Phosphatase: 74 U/L (ref 38–126)
Anion gap: 18 — ABNORMAL HIGH (ref 5–15)
BUN: 12 mg/dL (ref 8–23)
CO2: 20 mmol/L — ABNORMAL LOW (ref 22–32)
Calcium: 9.6 mg/dL (ref 8.9–10.3)
Chloride: 99 mmol/L (ref 98–111)
Creatinine, Ser: 1.51 mg/dL — ABNORMAL HIGH (ref 0.44–1.00)
GFR, Estimated: 37 mL/min — ABNORMAL LOW (ref >60)
Glucose, Bld: 132 mg/dL — ABNORMAL HIGH (ref 70–99)
Potassium: 4.5 mmol/L (ref 3.5–5.1)
Sodium: 137 mmol/L (ref 135–145)
Total Bilirubin: 2.5 mg/dL — ABNORMAL HIGH (ref 0.3–1.2)
Total Protein: 6.4 g/dL — ABNORMAL LOW (ref 6.5–8.1)

## 2022-07-20 LAB — URINALYSIS, ROUTINE W REFLEX MICROSCOPIC
Bilirubin Urine: NEGATIVE
Glucose, UA: NEGATIVE mg/dL
Ketones, ur: NEGATIVE mg/dL
Leukocytes,Ua: NEGATIVE
Nitrite: NEGATIVE
Protein, ur: 30 mg/dL — AB
Specific Gravity, Urine: 1.023 (ref 1.005–1.030)
pH: 5 (ref 5.0–8.0)

## 2022-07-20 LAB — I-STAT CHEM 8, ED
BUN: 15 mg/dL (ref 8–23)
Calcium, Ion: 1.14 mmol/L — ABNORMAL LOW (ref 1.15–1.40)
Chloride: 102 mmol/L (ref 98–111)
Creatinine, Ser: 1.3 mg/dL — ABNORMAL HIGH (ref 0.44–1.00)
Glucose, Bld: 132 mg/dL — ABNORMAL HIGH (ref 70–99)
HCT: 48 % — ABNORMAL HIGH (ref 36.0–46.0)
Hemoglobin: 16.3 g/dL — ABNORMAL HIGH (ref 12.0–15.0)
Potassium: 4.4 mmol/L (ref 3.5–5.1)
Sodium: 136 mmol/L (ref 135–145)
TCO2: 23 mmol/L (ref 22–32)

## 2022-07-20 LAB — CBG MONITORING, ED: Glucose-Capillary: 142 mg/dL — ABNORMAL HIGH (ref 70–99)

## 2022-07-20 LAB — CK: Total CK: 1651 U/L — ABNORMAL HIGH (ref 38–234)

## 2022-07-20 LAB — BRAIN NATRIURETIC PEPTIDE: B Natriuretic Peptide: 47.6 pg/mL (ref 0.0–100.0)

## 2022-07-20 MED ORDER — LACTATED RINGERS IV BOLUS
1000.0000 mL | Freq: Once | INTRAVENOUS | Status: AC
  Administered 2022-07-20: 1000 mL via INTRAVENOUS

## 2022-07-20 MED ORDER — LACTATED RINGERS IV SOLN: INTRAVENOUS | Status: DC

## 2022-07-20 MED ORDER — SODIUM CHLORIDE 0.9 % IV BOLUS
1000.0000 mL | Freq: Once | INTRAVENOUS | Status: AC
  Administered 2022-07-20: 1000 mL via INTRAVENOUS

## 2022-07-20 MED ORDER — IOHEXOL 350 MG/ML SOLN
65.0000 mL | Freq: Once | INTRAVENOUS | Status: AC | PRN
  Administered 2022-07-20: 65 mL via INTRAVENOUS

## 2022-07-20 NOTE — ED Notes (Signed)
Patient transported to CT 

## 2022-07-20 NOTE — H&P (Incomplete)
History and Physical    Mary Ferguson FMM:[id number] DOB: 04-27-1951 DOA: 07/20/2022  PCP: Ma Hillock, DO  Patient coming from: Home  Chief Complaint: Fall  HPI: Mary Ferguson is a 71 y.o. 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  A friend checked on her today and called EMS.  She ran out of her home oxygen 2 weeks ago and oxygen saturation was 64% on room air.  Patient was slightly hypothermic, tachycardic, and tachypneic on arrival to the ED, subsequently improved.  Initially required 15 L O2 via nonrebreather.  Blood pressure soft with systolic in the 77C and improved with IV fluids.  Labs showing WBC 11.9, hemoglobin 15.0, bicarb 20, anion gap 18, glucose 132, BUN 12, creatinine 1.5 (baseline 0.9-1.3), AST 62, T. bili 2.5 (sample hemolyzed), ALT and alk phos normal, CK 1651, BNP normal, UA pending.  CTA chest and CT abdomen pelvis showing: "IMPRESSION: 1. No pulmonary embolus. 2. Low lung volumes with no acute intrathoracic abnormality. 3. Small hiatal hernia. 4. Colonic diverticulosis. Nonspecific slight fat stranding within the left abdomen/pelvis along the proximal sigmoid colon. No associated bowel wall thickening. Finding may represent resolving or developing mild acute diverticulitis. 5. Supraumbilical and infraumbilical ventral hernias containing bowel with large abdominal defect as well as associated diastasis rectus. No findings suggest associated bowel obstruction or ischemia."  CT head/C-spine showing: "IMPRESSION: 1. Age indeterminate infarct versus chronic ischemic changes in the RIGHT basal ganglia. No other evidence of acute  intracranial abnormality. No evidence of hemorrhage. 2. No evidence of acute injury to the cervical spine. 3. Mild atrophy and chronic small-vessel white matter ischemic changes."  CT of lumbar and thoracic spine showing: "IMPRESSION: 1. No acute thoracic or lumbar spine fracture. Remote appearing T11 and L1 fractures. 2. Postoperative changes in the lower lumbar spine with laminectomy defects at L4-5 and L5-S1. 3. 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 4. Bilateral pars defects at L5. 5. Aortic atherosclerosis.   Aortic Atherosclerosis (ICD10-I70.0)."  X-rays of left hip/pelvis, left tibia/fibula, left elbow, and left shoulder negative for acute finding.  Patient was given 2 L fluid boluses.  [facility] called to admit.  History limited as patient is mumbling and difficult to understand.  States she ran out of her home oxygen 3 weeks ago.  She reports a fall in the kitchen 3 days ago and was not able to get up from the floor since then.  She is not sure if she hit her head or lost consciousness.  She reports chronic shortness of breath, no change from her baseline.  Denies chest pain.  She is reporting bilateral lower quadrant abdominal pain and reports history of diverticulitis.  Review of Systems:  Review of Systems  All other systems reviewed and are negative.   Past Medical History:  Diagnosis Date  . Acute and chronic respiratory failure with hypoxia ([place]) 01/24/2018  . Asthma   . Blood in stool   . Chicken pox   . Chronic generalized abdominal pain   . Chronic pain   . CKD (chronic kidney disease), stage III ([place])   . Colon polyps   . COPD (chronic obstructive pulmonary disease) ([place])   .  COVID-19 2020  . Depression   . Diverticulitis    Very severe.  Has had 2 bowel resections and temporary colostomy in the past.  . Fecal incontinence   . Frequent headaches   . GERD (gastroesophageal reflux disease)   . Heart attack ([place])   . Heart  disease   . History of blood transfusion   . Hyperlipemia   . Hypertension   . Incontinence of feces 12/11/2016  . Morbid obesity ([place])   . Pneumonia due to infectious organism 01/12/2018  . Postmenopausal 03/21/2021  . PTSD (post-traumatic stress disorder)   . Recurrent deep vein thrombosis (DVT) ([facility])    x6  . Urinary incontinence     Past Surgical History:  Procedure Laterality Date  . ABDOMINAL HYSTERECTOMY  1995  . APPENDECTOMY    . CATARACT EXTRACTION Bilateral   . CESAREAN SECTION  1984  . CHOLECYSTECTOMY  2016  . COLOSTOMY     and reversal  . CORONARY STENT INTERVENTION N/A 06/18/2019   Procedure: CORONARY STENT INTERVENTION;  Surgeon: Wellington Hampshire, MD;  Location: [facility];  Service: Cardiovascular;  Laterality: N/A;  . ENDOMETRIAL ABLATION    . [place] and 2016 (2016 ventral w/ incarceration) 33 surgeries  . HYSTEROSCOPY WITH D & C     13  . LAPAROSCOPIC LYSIS OF ADHESIONS     x2  . LEFT HEART CATH AND CORONARY ANGIOGRAPHY N/A 06/18/2019   Procedure: LEFT HEART CATH AND CORONARY ANGIOGRAPHY;  Surgeon: Wellington Hampshire, MD;  Location: [facility];  Service: Cardiovascular;  Laterality: N/A;  . LEFT HEART CATH AND CORONARY ANGIOGRAPHY N/A 11/30/2019   Procedure: LEFT HEART CATH AND CORONARY ANGIOGRAPHY;  Surgeon: Troy Sine, MD;  Location: [facility];  Service: Cardiovascular;  Laterality: N/A;  . 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 TONSILLECTOMY  1959  . VEIN SURGERY     laser x2     reports that she has quit smoking. She has never used smokeless tobacco. She reports that she does not currently use alcohol. She reports that she does not use drugs.  Allergies  Allergen Reactions  . Rofecoxib Anaphylaxis    VIOXX  . Metformin And Related     ibs  . Benadryl Allergy [Diphenhydramine Hcl] Other (See Comments)    Depressed respirations  . Cephalexin Rash  . Gatifloxacin Other (See Comments)     Confusion Very low blood pressure [place]    Family History  Problem Relation Age of Onset  . Depression Mother   . Drug abuse Mother   . Mental illness Mother   . Learning disabilities Mother   . Miscarriages / [place] Mother   . Alcohol abuse Mother   . Mental illness Father   . Depression Father   . Heart disease Father   . Drug abuse Sister   . Depression Sister   . Asthma Sister   . Alcohol abuse Sister   . Early death Sister   . Mental illness Son   . Hypertension Son   . Drug abuse Son   . Depression Son   . Asthma Son   . Alcohol abuse Son   . Clotting disorder Son   . Heart disease Maternal Grandmother   . Diabetes Maternal Grandmother   . Arthritis Maternal Grandmother   . COPD Maternal Grandmother   . Parkinson's disease Maternal Grandmother   . Heart disease Maternal Grandfather   .  Heart attack Maternal Grandfather   . Early death Maternal Grandfather   . COPD Maternal Grandfather   . Heart disease Paternal Grandmother   . Heart attack Paternal Grandmother   . Depression Paternal Grandmother   . Arthritis Paternal Grandmother   . Breast cancer Paternal Grandmother   . Heart disease Paternal Grandfather   . Heart attack Paternal Grandfather   . Early death Paternal Grandfather   . COPD Paternal Grandfather   . Mental illness Half-Brother   . Learning disabilities Half-Brother   . Hypertension Half-Brother   . Hyperlipidemia Half-Brother   . Drug abuse Half-Brother   . Heart disease Half-Brother   . Diabetes Half-Brother   . Depression Half-Brother   . Asthma Half-Brother   . Alcohol abuse Half-Brother   . Arthritis Half-Brother   . Kidney disease Half-Brother   . Testicular cancer Half-Brother   . Diabetes Half-Sister   . Mental illness Half-Sister   . Learning disabilities Half-Sister   . Drug abuse Half-Sister   . Depression Half-Sister   . Alcohol abuse Half-Sister   . Thyroid cancer Half-Sister     Prior to Admission  medications   Medication Sig Start Date End Date Taking? Authorizing Provider  acetaminophen (TYLENOL) 500 MG tablet Take 500 mg by mouth every 6 (six) hours as needed for mild pain or moderate pain.    [provider]  albuterol (VENTOLIN HFA) 108 (90 Base) MCG/ACT inhaler Inhale 2 puffs into the lungs every 4 (four) hours as needed for wheezing or shortness of breath. 08/03/21   Laurin Coder, MD  amitriptyline (ELAVIL) 50 MG tablet Take 1 tablet (50 mg total) by mouth at bedtime. 04/05/22   Kuneff, Renee A, DO  amLODipine (NORVASC) 2.5 MG tablet Take 1 tablet (2.5 mg total) by mouth daily. 10/09/21   Almyra Deforest, PA  atorvastatin (LIPITOR) 80 MG tablet Take 1 tablet (80 mg total) by mouth every evening. 10/09/21   Almyra Deforest, PA  budesonide-formoterol [facility]) 80-4.5 MCG/ACT inhaler Inhale 2 puffs into the lungs 2 (two) times daily. Patient not taking: Reported on 07/01/2022 08/03/21   Sherrilyn Rist A, MD  carvedilol (COREG) 6.25 MG tablet TAKE 1 TABLET BY MOUTH IN THE MORNING AND 2 TABLETS IN THE EVENING 10/09/21   Almyra Deforest, PA  Cholecalciferol (VITAMIN D3) 50 MCG (2000 UT) capsule Take 2 capsules (4,000 Units total) by mouth daily. 05/09/21   Kuneff, Renee A, DO  clotrimazole (CLOTRIMAZOLE ANTI-FUNGAL) 1 % cream Apply 1 Application topically 2 (two) times daily. 07/01/22   Kuneff, Renee A, DO  dicyclomine (BENTYL) 20 MG tablet Take 1 tablet (20 mg total) by mouth 4 (four) times daily as needed for spasms. 04/05/22   Kuneff, Renee A, DO  doxycycline (VIBRA-TABS) 100 MG tablet Take 1 tablet (100 mg total) by mouth 2 (two) times daily. 07/01/22   Kuneff, Renee A, DO  famotidine (PEPCID) 40 MG tablet Take 1 tablet (40 mg total) by mouth at bedtime. 04/05/22   Kuneff, Renee A, DO  hydrOXYzine (ATARAX) 25 MG tablet Take 1 tablet (25 mg total) by mouth every 8 (eight) hours as needed for itching. 04/05/22   Kuneff, Renee A, DO  ipratropium-albuterol (DUONEB) 0.5-2.5 (3) MG/3ML SOLN Take 3 mLs by  nebulization every 4 (four) hours as needed. Patient not taking: Reported on 07/01/2022 06/01/21   Laurin Coder, MD  isosorbide mononitrate (IMDUR) 60 MG 24 hr tablet TAKE 1 TABLET(60 MG) BY MOUTH DAILY 10/09/21   Almyra Deforest, [place]  nitroGLYCERIN (NITROSTAT) 0.4 MG SL tablet PLACE 1 TABLET UNDER THE TONGUE EVERY 5 MINUTES FOR 3 DOSES AS NEEDED FOR CHEST PAIN 08/30/21   Croitoru, Dani Gobble, MD  ondansetron (ZOFRAN) 4 MG tablet Take 1 tablet (4 mg total) by mouth every 8 (eight) hours as needed for nausea or vomiting. 07/01/22   Kuneff, Renee A, DO  predniSONE (DELTASONE) 20 MG tablet Take 2 tablets (40 mg total) by mouth daily with breakfast. 07/01/22   Kuneff, Renee A, DO  rivaroxaban (XARELTO) 20 MG TABS tablet TAKE 1 TABLET(20 MG) BY MOUTH EVERY EVENING 06/26/22   Croitoru, Dani Gobble, MD    Physical Exam: Vitals:   07/20/22 2132 07/20/22 2200 07/20/22 2230 07/20/22 2256  BP: (!) 90/58 104/63 (!) 107/96   Pulse:  96 93   Resp: _0 Temp:   98.2 F (36.8 C) 98.2 F (36.8 C)  TempSrc:   Oral Oral  SpO2:  99% 99%   Weight:      Height:        Physical Exam Vitals reviewed.  Constitutional:      General: She is not in acute distress. HENT:     Head: Normocephalic and atraumatic.     Mouth/Throat:     Comments: Very dry mucous membranes Eyes:     Extraocular Movements: Extraocular movements intact.  Cardiovascular:     Rate and Rhythm: Normal rate and regular rhythm.     Pulses: Normal pulses.  Pulmonary:     Effort: Pulmonary effort is normal. No respiratory distress.     Breath sounds: Normal breath sounds. No wheezing or rales.  Abdominal:     General: Bowel sounds are normal. There is no distension.     Palpations: Abdomen is soft.     Tenderness: There is abdominal tenderness. There is guarding.     Comments: Bilateral lower quadrants tender to palpation  Musculoskeletal:        General: No swelling or tenderness.     Cervical back: Normal range of motion.  Skin:    General:  Skin is warm and dry.  Neurological:     General: No focal deficit present.     Mental Status: She is alert and oriented to person, place, and time.     Labs on Admission: I have personally reviewed following labs and imaging studies  CBC: Recent Labs  Lab 07/20/22 1318 07/20/22 1334  WBC 11.9*  --   NEUTROABS 9.9*  --   HGB 15.0 16.3*  HCT 46.0 48.0*  MCV 81.7  --   PLT 280  --    Basic Metabolic Panel: Recent Labs  Lab 07/20/22 1318 07/20/22 1334  NA 137 136  K 4.5 4.4  CL 99 102  CO2 20*  --   GLUCOSE 132* 132*  BUN 12 15  CREATININE 1.51* 1.30*  CALCIUM 9.6  --    GFR: Estimated Creatinine Clearance: 47.1 mL/min (A) (by C-G formula based on SCr of 1.3 mg/dL (H)). Liver Function Tests: Recent Labs  Lab 07/20/22 1318  AST 62*  ALT 26  ALKPHOS 74  BILITOT 2.5*  PROT 6.4*  ALBUMIN 2.7*   No results for input(s): "LIPASE", "AMYLASE" in the last 168 hours. No results for input(s): "AMMONIA" in the last 168 hours. Coagulation Profile: No results for input(s): "INR", "PROTIME" in the last 168 hours. Cardiac Enzymes: Recent Labs  Lab 07/20/22 1318  CKTOTAL 1,651*   BNP (last 3 results) No results for input(s): "PROBNP" in  the last 8760 hours. HbA1C: No results for input(s): "HGBA1C" in the last 72 hours. CBG: Recent Labs  Lab 07/20/22 1324  GLUCAP 142*   Lipid Profile: No results for input(s): "CHOL", "HDL", "LDLCALC", "TRIG", "CHOLHDL", "LDLDIRECT" in the last 72 hours. Thyroid Function Tests: No results for input(s): "TSH", "T4TOTAL", "FREET4", "T3FREE", "THYROIDAB" in the last 72 hours. Anemia Panel: No results for input(s): "VITAMINB12", "FOLATE", "FERRITIN", "TIBC", "IRON", "RETICCTPCT" in the last 72 hours. Urine analysis: No results found for: "COLORURINE", "APPEARANCEUR", "LABSPEC", "PHURINE", "GLUCOSEU", "HGBUR", "BILIRUBINUR", "KETONESUR", "PROTEINUR", "UROBILINOGEN", "NITRITE", "LEUKOCYTESUR"  Radiological Exams on Admission: CT  ABDOMEN PELVIS W CONTRAST  Result Date: 07/20/2022 CLINICAL DATA:  Pulmonary embolism (PE) suspected, high prob; LLQ abdominal pain EXAM: CT ANGIOGRAPHY CHEST CT ABDOMEN AND PELVIS WITH CONTRAST TECHNIQUE: Multidetector CT imaging of the chest was performed using the standard protocol during bolus administration of intravenous contrast. Multiplanar CT image reconstructions and MIPs were obtained to evaluate the vascular anatomy. Multidetector CT imaging of the abdomen and pelvis was performed using the standard protocol during bolus administration of intravenous contrast. RADIATION DOSE REDUCTION: This exam was performed according to the departmental dose-optimization program which includes automated exposure control, adjustment of the mA and/or kV according to patient size and/or use of iterative reconstruction technique. CONTRAST:  64m OMNIPAQUE IOHEXOL 350 MG/ML SOLN COMPARISON:  None Available. FINDINGS: CTA CHEST FINDINGS Cardiovascular: Satisfactory opacification of the pulmonary arteries to the segmental level. No evidence of pulmonary embolism. normal heart size. No significant pericardial effusion. The thoracic aorta is normal in caliber. Mild atherosclerotic plaque of the thoracic aorta. At least 3 vessel coronary artery calcifications. Mediastinum/Nodes: No enlarged mediastinal, hilar, or axillary lymph nodes. Thyroid gland, trachea, and esophagus demonstrate no significant findings. Small volume hiatal hernia. Lungs/Pleura: Low lung volumes. No focal consolidation. 4 mm subpleural micronodule within the right middle lobe (7:41). No pulmonary mass. No pleural effusion. No pneumothorax. Musculoskeletal: No chest wall abnormality. No suspicious lytic or blastic osseous lesions. No acute displaced fracture. Multilevel degenerative changes of the spine. Review of the MIP images confirms the above findings. CT ABDOMEN and PELVIS FINDINGS Hepatobiliary: No focal liver abnormality. Status post  cholecystectomy. Common bile duct dilatation which can be seen in the post cholecystectomy setting. Pancreas: Diffusely atrophic. No focal lesion. Otherwise normal pancreatic contour. No surrounding inflammatory changes. No main pancreatic ductal dilatation. Spleen: Normal in size without focal abnormality. Adrenals/Urinary Tract: No adrenal nodule bilaterally. Bilateral kidneys enhance symmetrically. No hydronephrosis. No hydroureter. Bilateral parapelvic fluid density lesions likely representing simple renal cysts. Simple renal cysts, in the absence of clinically indicated signs/symptoms, require no independent follow-up. The urinary bladder is unremarkable. Stomach/Bowel: Stomach is within normal limits. No evidence of bowel wall thickening or dilatation. Second portion of the duodenum diverticula. Colonic diverticulosis. The appendix is not definitely identified with no inflammatory changes in the right lower quadrant to suggest acute appendicitis. Vascular/Lymphatic: No abdominal aorta or iliac aneurysm. Moderate to severe atherosclerotic plaque of the aorta and its branches. No abdominal, pelvic, or inguinal lymphadenopathy. Reproductive: Status post hysterectomy. No adnexal masses. Other: Nonspecific slight fat stranding within the left abdomen/pelvis (6:47, 3:78) along the proximal sigmoid colon. No intraperitoneal free fluid. No intraperitoneal free gas. No organized fluid collection. Musculoskeletal: Diastasis rectus. Small shallow supraumbilical ventral wall hernia with a non abdominal defect of 7.5 cm containing couple short loops of small bowel as well as mesenteric fat (3:63, 71:44315. Large infraumbilical ventral hernia containing several loops of small bowel and large bowel as well as  mesentery with an abdominal defect of 13 x 13 cm. No suspicious lytic or blastic osseous lesions. No acute displaced fracture. Multilevel degenerative changes of the spine. L1 chronic appearing compression fracture.  Review of the MIP images confirms the above findings. IMPRESSION: 1. No pulmonary embolus. 2. Low lung volumes with no acute intrathoracic abnormality. 3. Small hiatal hernia. 4. Colonic diverticulosis. Nonspecific slight fat stranding within the left abdomen/pelvis along the proximal sigmoid colon. No associated bowel wall thickening. Finding may represent resolving or developing mild acute diverticulitis. 5. Supraumbilical and infraumbilical ventral hernias containing bowel with large abdominal defect as well as associated diastasis rectus. No findings suggest associated bowel obstruction or ischemia. Electronically Signed   By: Iven Finn M.D.   On: 07/20/2022 20:12   CT Angio Chest PE W and/or Wo Contrast  Result Date: 07/20/2022 CLINICAL DATA:  Pulmonary embolism (PE) suspected, high prob; LLQ abdominal pain EXAM: CT ANGIOGRAPHY CHEST CT ABDOMEN AND PELVIS WITH CONTRAST TECHNIQUE: Multidetector CT imaging of the chest was performed using the standard protocol during bolus administration of intravenous contrast. Multiplanar CT image reconstructions and MIPs were obtained to evaluate the vascular anatomy. Multidetector CT imaging of the abdomen and pelvis was performed using the standard protocol during bolus administration of intravenous contrast. RADIATION DOSE REDUCTION: This exam was performed according to the departmental dose-optimization program which includes automated exposure control, adjustment of the mA and/or kV according to patient size and/or use of iterative reconstruction technique. CONTRAST:  63m OMNIPAQUE IOHEXOL 350 MG/ML SOLN COMPARISON:  None Available. FINDINGS: CTA CHEST FINDINGS Cardiovascular: Satisfactory opacification of the pulmonary arteries to the segmental level. No evidence of pulmonary embolism. normal heart size. No significant pericardial effusion. The thoracic aorta is normal in caliber. Mild atherosclerotic plaque of the thoracic aorta. At least 3 vessel coronary  artery calcifications. Mediastinum/Nodes: No enlarged mediastinal, hilar, or axillary lymph nodes. Thyroid gland, trachea, and esophagus demonstrate no significant findings. Small volume hiatal hernia. Lungs/Pleura: Low lung volumes. No focal consolidation. 4 mm subpleural micronodule within the right middle lobe (7:41). No pulmonary mass. No pleural effusion. No pneumothorax. Musculoskeletal: No chest wall abnormality. No suspicious lytic or blastic osseous lesions. No acute displaced fracture. Multilevel degenerative changes of the spine. Review of the MIP images confirms the above findings. CT ABDOMEN and PELVIS FINDINGS Hepatobiliary: No focal liver abnormality. Status post cholecystectomy. Common bile duct dilatation which can be seen in the post cholecystectomy setting. Pancreas: Diffusely atrophic. No focal lesion. Otherwise normal pancreatic contour. No surrounding inflammatory changes. No main pancreatic ductal dilatation. Spleen: Normal in size without focal abnormality. Adrenals/Urinary Tract: No adrenal nodule bilaterally. Bilateral kidneys enhance symmetrically. No hydronephrosis. No hydroureter. Bilateral parapelvic fluid density lesions likely representing simple renal cysts. Simple renal cysts, in the absence of clinically indicated signs/symptoms, require no independent follow-up. The urinary bladder is unremarkable. Stomach/Bowel: Stomach is within normal limits. No evidence of bowel wall thickening or dilatation. Second portion of the duodenum diverticula. Colonic diverticulosis. The appendix is not definitely identified with no inflammatory changes in the right lower quadrant to suggest acute appendicitis. Vascular/Lymphatic: No abdominal aorta or iliac aneurysm. Moderate to severe atherosclerotic plaque of the aorta and its branches. No abdominal, pelvic, or inguinal lymphadenopathy. Reproductive: Status post hysterectomy. No adnexal masses. Other: Nonspecific slight fat stranding within the  left abdomen/pelvis (6:47, 3:78) along the proximal sigmoid colon. No intraperitoneal free fluid. No intraperitoneal free gas. No organized fluid collection. Musculoskeletal: Diastasis rectus. Small shallow supraumbilical ventral wall hernia with a non abdominal  defect of 7.5 cm containing couple short loops of small bowel as well as mesenteric fat (3:63, 7:15024). Large infraumbilical ventral hernia containing several loops of small bowel and large bowel as well as mesentery with an abdominal defect of 13 x 13 cm. No suspicious lytic or blastic osseous lesions. No acute displaced fracture. Multilevel degenerative changes of the spine. L1 chronic appearing compression fracture. Review of the MIP images confirms the above findings. IMPRESSION: 1. No pulmonary embolus. 2. Low lung volumes with no acute intrathoracic abnormality. 3. Small hiatal hernia. 4. Colonic diverticulosis. Nonspecific slight fat stranding within the left abdomen/pelvis along the proximal sigmoid colon. No associated bowel wall thickening. Finding may represent resolving or developing mild acute diverticulitis. 5. Supraumbilical and infraumbilical ventral hernias containing bowel with large abdominal defect as well as associated diastasis rectus. No findings suggest associated bowel obstruction or ischemia. Electronically Signed   By: Iven Finn M.D.   On: 07/20/2022 20:12   CT Lumbar Spine Wo Contrast  Result Date: 07/20/2022 CLINICAL DATA:  Found down. EXAM: CT THORACIC AND LUMBAR SPINE WITHOUT CONTRAST TECHNIQUE: Multidetector CT imaging of the thoracic and lumbar spine was performed without contrast. Multiplanar CT image reconstructions were also generated. RADIATION DOSE REDUCTION: This exam was performed according to the departmental dose-optimization program which includes automated exposure control, adjustment of the mA and/or kV according to patient size and/or use of iterative reconstruction technique. COMPARISON:  None  Available. FINDINGS: CT THORACIC SPINE FINDINGS Alignment: Normal Vertebrae: No acute fracture or bone lesion. Mild compression deformity T11 appears remote. No posterior rib fractures are identified. Paraspinal and other soft tissues: No significant findings. Moderate-sized hiatal hernia. Dependent bibasilar atelectasis. Coronary artery and aortic calcifications. Disc levels: No large disc protrusions, spinal foraminal stenosis. CT LUMBAR SPINE FINDINGS Segmentation: There are five lumbar type vertebral bodies. The last full intervertebral disc space is labeled L5-S1. Alignment: Normal Vertebrae: L1 compression fracture appears remote. No acute fracture lines or paraspinal hematoma. No bone lesions. Bilateral pars defects noted at L5 with minimal anterolisthesis. Paraspinal and other soft tissues: Advanced aortic calcifications but no aneurysm. Disc levels: L1-2: No significant findings. L2-3: Advanced facet disease but no spinal or foraminal stenosis. L3-4: Advanced facet disease and mild bulging annulus but no significant spinal or foraminal stenosis. L4-5: Severe facet disease and despite prior laminectomy there is moderately severe spinal and bilateral lateral recess stenosis. L5-S1: Bulging uncovered disc but generous spinal canal. Wide decompressive laminectomy. No spinal or foraminal stenosis. IMPRESSION: 1. No acute thoracic or lumbar spine fracture. Remote appearing T11 and L1 fractures. 2. Postoperative changes in the lower lumbar spine with laminectomy defects at L4-5 and L5-S1. 3. 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 4. Bilateral pars defects at L5. 5. Aortic atherosclerosis. Aortic Atherosclerosis (ICD10-I70.0). Electronically Signed   By: Marijo Sanes M.D.   On: 07/20/2022 17:04   CT Thoracic Spine Wo Contrast  Result Date: 07/20/2022 CLINICAL DATA:  Found down. EXAM: CT THORACIC AND LUMBAR SPINE WITHOUT CONTRAST TECHNIQUE: Multidetector CT imaging of the  thoracic and lumbar spine was performed without contrast. Multiplanar CT image reconstructions were also generated. RADIATION DOSE REDUCTION: This exam was performed according to the departmental dose-optimization program which includes automated exposure control, adjustment of the mA and/or kV according to patient size and/or use of iterative reconstruction technique. COMPARISON:  None Available. FINDINGS: CT THORACIC SPINE FINDINGS Alignment: Normal Vertebrae: No acute fracture or bone lesion. Mild compression deformity T11 appears remote. No posterior rib fractures  are identified. Paraspinal and other soft tissues: No significant findings. Moderate-sized hiatal hernia. Dependent bibasilar atelectasis. Coronary artery and aortic calcifications. Disc levels: No large disc protrusions, spinal foraminal stenosis. CT LUMBAR SPINE FINDINGS Segmentation: There are five lumbar type vertebral bodies. The last full intervertebral disc space is labeled L5-S1. Alignment: Normal Vertebrae: L1 compression fracture appears remote. No acute fracture lines or paraspinal hematoma. No bone lesions. Bilateral pars defects noted at L5 with minimal anterolisthesis. Paraspinal and other soft tissues: Advanced aortic calcifications but no aneurysm. Disc levels: L1-2: No significant findings. L2-3: Advanced facet disease but no spinal or foraminal stenosis. L3-4: Advanced facet disease and mild bulging annulus but no significant spinal or foraminal stenosis. L4-5: Severe facet disease and despite prior laminectomy there is moderately severe spinal and bilateral lateral recess stenosis. L5-S1: Bulging uncovered disc but generous spinal canal. Wide decompressive laminectomy. No spinal or foraminal stenosis. IMPRESSION: 1. No acute thoracic or lumbar spine fracture. Remote appearing T11 and L1 fractures. 2. Postoperative changes in the lower lumbar spine with laminectomy defects at L4-5 and L5-S1. 3. Moderately severe spinal and bilateral  lateral recess stenosis at L4-5. 4. Bilateral pars defects at L5. 5. Aortic atherosclerosis. Aortic Atherosclerosis (ICD10-I70.0). Electronically Signed   By: Marijo Sanes M.D.   On: 07/20/2022 17:04   CT Head Wo Contrast  Result Date: 07/20/2022 CLINICAL DATA:  71 year old female with fall. EXAM: CT HEAD WITHOUT CONTRAST CT CERVICAL SPINE WITHOUT CONTRAST TECHNIQUE: Multidetector CT imaging of the head and cervical spine was performed following the standard protocol without intravenous contrast. Multiplanar CT image reconstructions of the cervical spine were also generated. RADIATION DOSE REDUCTION: This exam was performed according to the departmental dose-optimization program which includes automated exposure control, adjustment of the mA and/or kV according to patient size and/or use of iterative reconstruction technique. COMPARISON:  None Available. FINDINGS: CT HEAD FINDINGS Brain: Age indeterminate infarct versus chronic ischemic changes in the RIGHT basal ganglia noted. No other infarct, hemorrhage, mass lesion or mass effect, hydrocephalus or extra-axial collection noted. Mild atrophy and chronic small-vessel white matter ischemic changes noted. Vascular: Carotid and vertebral atherosclerotic calcifications are noted. Skull: Normal. Negative for fracture or focal lesion. Sinuses/Orbits: No acute finding. Other: None. CT CERVICAL SPINE FINDINGS Alignment: Normal. Skull base and vertebrae: No acute fracture. No primary bone lesion or focal pathologic process. Soft tissues and spinal canal: No prevertebral fluid or swelling. No visible canal hematoma. Disc levels: Mild multilevel facet arthropathy noted contributing to mild bony foraminal narrowing throughout the cervical spine. Disc spaces are relatively maintained. Upper chest: No acute abnormality Other: None IMPRESSION: 1. Age indeterminate infarct versus chronic ischemic changes in the RIGHT basal ganglia. No other evidence of acute intracranial  abnormality. No evidence of hemorrhage. 2. No evidence of acute injury to the cervical spine. 3. Mild atrophy and chronic small-vessel white matter ischemic changes. Electronically Signed   By: Margarette Canada M.D.   On: 07/20/2022 16:13   CT Cervical Spine Wo Contrast  Result Date: 07/20/2022 CLINICAL DATA:  71 year old female with fall. EXAM: CT HEAD WITHOUT CONTRAST CT CERVICAL SPINE WITHOUT CONTRAST TECHNIQUE: Multidetector CT imaging of the head and cervical spine was performed following the standard protocol without intravenous contrast. Multiplanar CT image reconstructions of the cervical spine were also generated. RADIATION DOSE REDUCTION: This exam was performed according to the departmental dose-optimization program which includes automated exposure control, adjustment of the mA and/or kV according to patient size and/or use of iterative reconstruction technique. COMPARISON:  None  Available. FINDINGS: CT HEAD FINDINGS Brain: Age indeterminate infarct versus chronic ischemic changes in the RIGHT basal ganglia noted. No other infarct, hemorrhage, mass lesion or mass effect, hydrocephalus or extra-axial collection noted. Mild atrophy and chronic small-vessel white matter ischemic changes noted. Vascular: Carotid and vertebral atherosclerotic calcifications are noted. Skull: Normal. Negative for fracture or focal lesion. Sinuses/Orbits: No acute finding. Other: None. CT CERVICAL SPINE FINDINGS Alignment: Normal. Skull base and vertebrae: No acute fracture. No primary bone lesion or focal pathologic process. Soft tissues and spinal canal: No prevertebral fluid or swelling. No visible canal hematoma. Disc levels: Mild multilevel facet arthropathy noted contributing to mild bony foraminal narrowing throughout the cervical spine. Disc spaces are relatively maintained. Upper chest: No acute abnormality Other: None IMPRESSION: 1. Age indeterminate infarct versus chronic ischemic changes in the RIGHT basal ganglia.  No other evidence of acute intracranial abnormality. No evidence of hemorrhage. 2. No evidence of acute injury to the cervical spine. 3. Mild atrophy and chronic small-vessel white matter ischemic changes. Electronically Signed   By: Margarette Canada M.D.   On: 07/20/2022 16:13   DG Chest 2 View  Result Date: 07/20/2022 CLINICAL DATA:  Fall, left rib pain EXAM: CHEST - 2 VIEW COMPARISON:  06/17/2019 FINDINGS: Low volume portable examination. Cardiomegaly. Mild, diffuse bilateral interstitial pulmonary opacity and bronchovascular crowding. No obvious displaced rib fracture. IMPRESSION: 1. Low volume portable examination. 2. Cardiomegaly. 3. Mild, diffuse bilateral interstitial pulmonary opacity and bronchovascular crowding, likely edema. 4. No obvious displaced rib fracture or pneumothorax. CT may be helpful to further evaluate if fracture is suspected. Electronically Signed   By: Delanna Ahmadi M.D.   On: 07/20/2022 15:17   DG Elbow Complete Left  Result Date: 07/20/2022 CLINICAL DATA:  Pain after fall EXAM: LEFT ELBOW - COMPLETE 3+ VIEW COMPARISON:  None Available. FINDINGS: No acute fracture or dislocation in the left elbow. Mild degenerative arthritis. No elbow joint effusion. No significant soft tissue abnormality. IMPRESSION: No acute fracture or dislocation. Electronically Signed   By: Placido Sou M.D.   On: 07/20/2022 15:16   DG Shoulder Left  Result Date: 07/20/2022 CLINICAL DATA:  Fall.  Pain. EXAM: LEFT SHOULDER - 2+ VIEW COMPARISON:  None Available. FINDINGS: There is no evidence of fracture or dislocation. There is no evidence of arthropathy or other focal bone abnormality. Soft tissues are unremarkable. IMPRESSION: Negative. Electronically Signed   By: Misty Stanley M.D.   On: 07/20/2022 15:15   DG Tibia/Fibula Left  Result Date: 07/20/2022 CLINICAL DATA:  Fall.  Pain. EXAM: LEFT TIBIA AND FIBULA - 2 VIEW COMPARISON:  None Available. FINDINGS: There is no evidence of fracture or other  focal bone lesions. Soft tissues are unremarkable. IMPRESSION: Negative. Electronically Signed   By: Misty Stanley M.D.   On: 07/20/2022 15:15   DG Hip Unilat W or Wo Pelvis 2-3 Views Left  Result Date: 07/20/2022 CLINICAL DATA:  Hip pain after fall EXAM: DG HIP (WITH OR WITHOUT PELVIS) 2-3V LEFT COMPARISON:  None Available. FINDINGS: No acute fracture or dislocation in the pelvis. Degenerative changes pubic symphysis, both hips, SI joints and lower lumbar spine. IMPRESSION: No acute fracture or dislocation. Electronically Signed   By: Placido Sou M.D.   On: 07/20/2022 15:14    EKG: Independently reviewed.  Sinus tachycardia, QTc 484.  Borderline T wave abnormalities seen on prior EKGs as well.  Assessment and Plan  Rhabdomyolysis Patient fell and was down on the floor for about 3 days. CK 1651. -  Continue IV fluid hydration -Trend CK  AKI on CKD stage IIIa Likely secondary to rhabdomyolysis and severe dehydration.  Creatinine 1.5, baseline 0.9-1.3.  CT without evidence of obstructive uropathy. -Continue IV fluid hydration -Monitor renal function and urine output -Avoid nephrotoxic agents  Acute on chronic hypoxemic respiratory failure Patient has been out of her home oxygen for 2 to 3 weeks Initial oxygen saturation noted to be 64% on room air.  Currently stable on 3 L O2 which is her baseline.  CTA chest negative for PE or any other acute pulmonary abnormality. -Continuous pulse ox -Continue supplemental oxygen  Acute diverticulitis CT showing findings suspicious for mild acute diverticulitis.  Patient is endorsing lower abdominal pain and has abdominal tenderness on exam.  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  A friend checked on her today and called EMS.  She ran out of her home oxygen 2 weeks ago and oxygen saturation was 64% on room air.  Patient was slightly hypothermic, tachycardic, and tachypneic on arrival to the ED, subsequently improved.  Initially required 15 L O2 via nonrebreather.  Blood pressure soft with systolic in the 97X and improved with IV fluids.  Labs showing WBC 11.9, hemoglobin 15.0, bicarb 20, anion gap 18, glucose 132, AST 62, T. bili 2.5 (sample hemolyzed), ALT and alk phos normal,  BNP normal, UA pending.    5. Supraumbilical and infraumbilical ventral hernias containing bowel with large abdominal defect as well as associated diastasis rectus. No findings suggest associated bowel obstruction or ischemia."  CT head/C-spine showing: "IMPRESSION: 1. Age indeterminate infarct versus chronic ischemic changes in the RIGHT basal ganglia. No other evidence of acute intracranial abnormality. No evidence of hemorrhage. 2. No evidence of acute injury to the cervical spine. 3. Mild atrophy and chronic small-vessel white matter ischemic changes."  CT of lumbar and thoracic spine showing: "IMPRESSION: 1. No acute thoracic or lumbar spine fracture. Remote appearing T11 and L1 fractures. 2. Postoperative changes in the lower lumbar spine with laminectomy defects at L4-5 and L5-S1. 3. 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 4. Bilateral pars defects at L5. 5. Aortic atherosclerosis.   Aortic Atherosclerosis (ICD10-I70.0)."  X-rays of left hip/pelvis, left tibia/fibula, left elbow, and left shoulder negative for acute finding.  Patient was given 2 L fluid boluses.  [facility] called to admit.  DVT prophylaxis: {Blank single:19197::"Lovenox","SQ Heparin","IV heparin gtt","Xarelto","Eliquis","Coumadin","SCDs","***"} Code Status: {Blank single:19197::"Full Code","DNR","DNR/DNI","Comfort Care","***"} Family Communication: ***  Consults  called: ***  Level of care: {Blank single:19197::"Med-Surg","Telemetry bed","Progressive Care Unit","Step Down Unit"} Admission status: ***  Shela Leff MD [facility]  If 7PM-7AM, please contact night-coverage [email or web address]  07/20/2022, 10:59 PM

## 2022-07-20 NOTE — ED Notes (Signed)
Patient transported to X-ray 

## 2022-07-20 NOTE — ED Triage Notes (Signed)
Pt arrives via EMS from home after a fall 2 days ago. Pt was found by a friend. Pt unknown if she hit or head or LOC. 64% on RA, pt was supposed to be on 3L but ran out, brought in on NRB 15L.

## 2022-07-20 NOTE — ED Notes (Signed)
Pt has redness and pain to left hip, redness and yeast to folds.

## 2022-07-20 NOTE — ED Provider Notes (Signed)
[facility]   CSN: [id number] Arrival date & time: 07/20/22  1241     History  Chief Complaint  Patient presents with   Marletta LorFall    Mary MohLyle Colquhoun is a 71 y.o. female.  Patient is a 71 year old female who presents with hip pain after a fall.  She has a history of recurrent DVT on Xarelto, chronic kidney disease, hypertension, hyperlipidemia, COPD.  She is on home oxygen intermittently at 3 L/min.  However she ran out of it about 2 weeks ago.  She says she had a fall about 2 to 3 days ago.  She is not sure if it was on Wednesday or Thursday.  She has problems with her depth perception and was reaching for something and fell forward, landing on her left side.  She has pain in her left hip and her left arm from the fall.  She was unable to get up and has been laying on the floor for that time.  Either 2 or 3 days.  A friend found her today.  She has been laying in urine.  She complains of pain to her left leg and left arm and also has developed yeast infection under her breast and has soreness at that area.  She denies any other injuries.  She has little bit of shortness of breath.  No other recent illnesses.       Home Medications Prior to Admission medications   Medication Sig Start Date End Date Taking? Authorizing Provider  acetaminophen (TYLENOL) 500 MG tablet Take 500 mg by mouth every 6 (six) hours as needed for mild pain or moderate pain.    [provider]  albuterol (VENTOLIN HFA) 108 (90 Base) MCG/ACT inhaler Inhale 2 puffs into the lungs every 4 (four) hours as needed for wheezing or shortness of breath. 08/03/21   Tomma Lightninglalere, Adewale A, MD  amitriptyline (ELAVIL) 50 MG tablet Take 1 tablet (50 mg total) by mouth at bedtime. 04/05/22   Kuneff, Renee A, DO  amLODipine (NORVASC) 2.5 MG tablet Take 1 tablet (2.5 mg total) by mouth daily. 10/09/21   Azalee CourseMeng, Hao, PA  atorvastatin (LIPITOR) 80 MG tablet Take 1 tablet (80 mg total) by  mouth every evening. 10/09/21   Azalee CourseMeng, Hao, PA  budesonide-formoterol [facility](SYMBICORT) 80-4.5 MCG/ACT inhaler Inhale 2 puffs into the lungs 2 (two) times daily. Patient not taking: Reported on 07/01/2022 08/03/21   Virl Diamondlalere, Adewale A, MD  carvedilol (COREG) 6.25 MG tablet TAKE 1 TABLET BY MOUTH IN THE MORNING AND 2 TABLETS IN THE EVENING 10/09/21   Azalee CourseMeng, Hao, PA  Cholecalciferol (VITAMIN D3) 50 MCG (2000 UT) capsule Take 2 capsules (4,000 Units total) by mouth daily. 05/09/21   Kuneff, Renee A, DO  clotrimazole (CLOTRIMAZOLE ANTI-FUNGAL) 1 % cream Apply 1 Application topically 2 (two) times daily. 07/01/22   Kuneff, Renee A, DO  dicyclomine (BENTYL) 20 MG tablet Take 1 tablet (20 mg total) by mouth 4 (four) times daily as needed for spasms. 04/05/22   Kuneff, Renee A, DO  doxycycline (VIBRA-TABS) 100 MG tablet Take 1 tablet (100 mg total) by mouth 2 (two) times daily. 07/01/22   Kuneff, Renee A, DO  famotidine (PEPCID) 40 MG tablet Take 1 tablet (40 mg total) by mouth at bedtime. 04/05/22   Kuneff, Renee A, DO  hydrOXYzine (ATARAX) 25 MG tablet Take 1 tablet (25 mg total) by mouth every 8 (eight) hours as needed for itching. 04/05/22   Kuneff, Renee A, DO  ipratropium-albuterol (DUONEB) 0.5-2.5 (3) MG/3ML SOLN Take 3 mLs by nebulization every 4 (four) hours as needed. Patient not taking: Reported on 07/01/2022 06/01/21   Tomma Lightning, MD  isosorbide mononitrate (IMDUR) 60 MG 24 hr tablet TAKE 1 TABLET(60 MG) BY MOUTH DAILY 10/09/21   Azalee Course, PA  nitroGLYCERIN (NITROSTAT) 0.4 MG SL tablet PLACE 1 TABLET UNDER THE TONGUE EVERY 5 MINUTES FOR 3 DOSES AS NEEDED FOR CHEST PAIN 08/30/21   Croitoru, Rachelle Hora, MD  ondansetron (ZOFRAN) 4 MG tablet Take 1 tablet (4 mg total) by mouth every 8 (eight) hours as needed for nausea or vomiting. 07/01/22   Kuneff, Renee A, DO  predniSONE (DELTASONE) 20 MG tablet Take 2 tablets (40 mg total) by mouth daily with breakfast. 07/01/22   Kuneff, Renee A, DO  rivaroxaban (XARELTO) 20 MG TABS  tablet TAKE 1 TABLET(20 MG) BY MOUTH EVERY EVENING 06/26/22   Croitoru, Mihai, MD      Allergies    Rofecoxib, Metformin and related, Benadryl allergy [diphenhydramine hcl], Cephalexin, and Gatifloxacin    Review of Systems   Review of Systems  Constitutional:  Positive for fatigue. Negative for chills, diaphoresis and fever.  HENT:  Negative for congestion, rhinorrhea and sneezing.   Eyes: Negative.   Respiratory:  Negative for cough, chest tightness and shortness of breath.   Cardiovascular:  Negative for chest pain and leg swelling.  Gastrointestinal:  Negative for abdominal pain, blood in stool, diarrhea, nausea and vomiting.  Genitourinary:  Negative for difficulty urinating, flank pain, frequency and hematuria.  Musculoskeletal:  Positive for arthralgias and back pain.  Skin:  Positive for rash.  Neurological:  Negative for dizziness, speech difficulty, weakness, numbness and headaches.    Physical Exam Updated Vital Signs BP 106/62   Pulse 99   Temp (!) 96 F (35.6 C) (Rectal)   Resp 18   Ht 5\' 2"  (1.575 m)   Wt 110 kg   SpO2 100%   BMI 44.35 kg/m  Physical Exam Constitutional:      Appearance: She is well-developed. She is obese.  HENT:     Head: Normocephalic and atraumatic.  Eyes:     Pupils: Pupils are equal, round, and reactive to light.  Neck:     Comments: No pain to cervical spine there is tenderness in the mid thoracic spine and lower lumbosacral spine.  No step-offs or deformities. Cardiovascular:     Rate and Rhythm: Normal rate and regular rhythm.     Heart sounds: Normal heart sounds.  Pulmonary:     Effort: Pulmonary effort is normal. No respiratory distress.     Breath sounds: Normal breath sounds. No wheezing or rales.  Chest:     Chest wall: No tenderness.  Abdominal:     General: Bowel sounds are normal.     Palpations: Abdomen is soft.     Tenderness: There is no abdominal tenderness. There is no guarding or rebound.  Musculoskeletal:         General: Normal range of motion.     Cervical back: Normal range of motion and neck supple.     Comments: There is tenderness to the left hip.  There are some erythema to the posterior aspect of the left hip where apparently she has been laying.  There is pain in the left knee.  No pain in the foot or ankle.  Fetal pulses are intact bilaterally.  There are some pain in left shoulder and left elbow.  There is a  small abrasion to the left elbow.  No pain in the right extremities.    Lymphadenopathy:     Cervical: No cervical adenopathy.  Skin:    General: Skin is warm and dry.     Findings: No rash.     Comments: She has an extensive yeast infection under both breasts  Neurological:     Mental Status: She is alert and oriented to person, place, and time.     ED Results / Procedures / Treatments   Labs (all labs ordered are listed, but only abnormal results are displayed) Labs Reviewed  COMPREHENSIVE METABOLIC PANEL - Abnormal; Notable for the following components:      Result Value   CO2 20 (*)    Glucose, Bld 132 (*)    Creatinine, Ser 1.51 (*)    Total Protein 6.4 (*)    Albumin 2.7 (*)    AST 62 (*)    Total Bilirubin 2.5 (*)    GFR, Estimated 37 (*)    Anion gap 18 (*)    All other components within normal limits  CBC WITH DIFFERENTIAL/PLATELET - Abnormal; Notable for the following components:   WBC 11.9 (*)    RBC 5.63 (*)    RDW 17.0 (*)    Neutro Abs 9.9 (*)    All other components within normal limits  CK - Abnormal; Notable for the following components:   Total CK 1,651 (*)    All other components within normal limits  I-STAT CHEM 8, ED - Abnormal; Notable for the following components:   Creatinine, Ser 1.30 (*)    Glucose, Bld 132 (*)    Calcium, Ion 1.14 (*)    Hemoglobin 16.3 (*)    HCT 48.0 (*)    All other components within normal limits  CBG MONITORING, ED - Abnormal; Notable for the following components:   Glucose-Capillary 142 (*)    All other  components within normal limits  URINALYSIS, ROUTINE W REFLEX MICROSCOPIC    EKG EKG Interpretation  Date/Time:  Saturday July 20 2022 13:06:32 EST Ventricular Rate:  106 PR Interval:  185 QRS Duration: 94 QT Interval:  364 QTC Calculation: 484 R Axis:   -68 Text Interpretation: Sinus tachycardia Left anterior fascicular block Low voltage, precordial leads Abnormal R-wave progression, late transition Borderline T abnormalities, anterior leads No old tracing to compare Confirmed by Rolan Bucco [phone]) on 07/20/2022 2:11:48 PM  Radiology No results found.  Procedures Procedures    Medications Ordered in ED Medications  sodium chloride 0.9 % bolus 1,000 mL (1,000 mLs Intravenous New Bag/Given 07/20/22 1327)    ED Course/ Medical Decision Making/ A&P Clinical Course as of 07/20/22 1513  Sat Jul 20, 2022  1504 Larey Seat, on floor for 2-3 days, pending imaging [MK]    Clinical Course User Index [MK] Glendora Score, MD                           Medical Decision Making Amount and/or Complexity of Data Reviewed Labs: ordered. Radiology: ordered.   Patient is a 71 year old female who presents after a fall 2 to 3 days ago.  Her labs reveal evidence of some rhabdomyolysis with a little bit of increase in her creatinine as compared to prior values.  She was a little bit hypothermic on arrival and tachycardic.  She was started on IV fluids.  Imaging studies are still pending.  Care turned over to Dr. Audrie Lia pending reassessment after imaging  studies.  Final Clinical Impression(s) / ED Diagnoses Final diagnoses:  Fall, initial encounter  Non-traumatic rhabdomyolysis    Rx / DC Orders ED Discharge Orders     None         Rolan Bucco, MD 07/20/22 1514

## 2022-07-20 NOTE — H&P (Signed)
History and Physical    Mary Ferguson IDP:[id number] DOB: 1951/07/16 DOA: 07/20/2022  PCP: Ma Hillock, DO  Patient coming from: Home  Chief Complaint: Fall  HPI: Mary Ferguson is a 71 y.o. 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  A friend checked on her today and called EMS.  She ran out of her home oxygen 2 weeks ago and oxygen saturation was 64% on room air.  Patient was slightly hypothermic, tachycardic, and tachypneic on arrival to the ED, subsequently improved.  Initially required 15 L O2 via nonrebreather.  Blood pressure soft with systolic in the 44R.  Labs showing WBC 11.9, hemoglobin 15.0, bicarb 20, anion gap 18, glucose 132, BUN 12, creatinine 1.5 (baseline 0.9-1.3), AST 62, T. bili 2.5 (sample hemolyzed), ALT and alk phos normal, CK 1651, BNP normal, UA pending.  CTA chest and CT abdomen pelvis showing: "IMPRESSION: 1. No pulmonary embolus. 2. Low lung volumes with no acute intrathoracic abnormality. 3. Small hiatal hernia. 4. Colonic diverticulosis. Nonspecific slight fat stranding within the left abdomen/pelvis along the proximal sigmoid colon. No associated bowel wall thickening. Finding may represent resolving or developing mild acute diverticulitis. 5. Supraumbilical and infraumbilical ventral hernias containing bowel with large abdominal defect as well as associated diastasis rectus. No findings suggest associated bowel obstruction or ischemia."  CT head/C-spine showing: "IMPRESSION: 1. Age indeterminate infarct versus chronic ischemic changes in the RIGHT basal ganglia. No other evidence of acute intracranial abnormality. No evidence of  hemorrhage. 2. No evidence of acute injury to the cervical spine. 3. Mild atrophy and chronic small-vessel white matter ischemic changes."  CT of lumbar and thoracic spine showing: "IMPRESSION: 1. No acute thoracic or lumbar spine fracture. Remote appearing T11 and L1 fractures. 2. Postoperative changes in the lower lumbar spine with laminectomy defects at L4-5 and L5-S1. 3. 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 4. Bilateral pars defects at L5. 5. Aortic atherosclerosis.   Aortic Atherosclerosis (ICD10-I70.0)."  X-rays of left hip/pelvis, left tibia/fibula, left elbow, and left shoulder negative for acute finding.  Patient was given 2 L fluid boluses.  [facility] called to admit.  History limited as patient is mumbling and difficult to understand.  States she ran out of her home oxygen 3 weeks ago.  She reports a fall in the kitchen 3 days ago and was not able to get up from the floor since then.  She is not sure if she hit her head or lost consciousness.  She reports chronic shortness of breath, no change from her baseline.  Denies chest pain.  She is reporting bilateral lower quadrant abdominal pain.  Review of Systems:  Review of Systems  All other systems reviewed and are negative.   Past Medical History:  Diagnosis Date   Acute and chronic respiratory failure with hypoxia ([place]) 01/24/2018   Asthma    Blood in stool    Chicken pox    Chronic generalized abdominal pain    Chronic pain    CKD (chronic kidney disease), stage III (HCC)    Colon polyps    COPD (chronic obstructive pulmonary disease) ([place])    COVID-19 2020   Depression  Diverticulitis    Very severe.  Has had 2 bowel resections and temporary colostomy in the past.   Fecal incontinence    Frequent headaches    GERD (gastroesophageal reflux disease)    Heart attack ([place])    Heart disease    History of blood transfusion    Hyperlipemia    Hypertension    Incontinence of  feces 12/11/2016   Morbid obesity ([place])    Pneumonia due to infectious organism 01/12/2018   Postmenopausal 03/21/2021   PTSD (post-traumatic stress disorder)    Recurrent deep vein thrombosis (DVT) ([place])    x6   Urinary incontinence     Past Surgical History:  Procedure Laterality Date   ABDOMINAL HYSTERECTOMY  1995   APPENDECTOMY     CATARACT EXTRACTION Bilateral    CESAREAN SECTION  1984   CHOLECYSTECTOMY  2016   COLOSTOMY     and reversal   CORONARY STENT INTERVENTION N/A 06/18/2019   Procedure: CORONARY STENT INTERVENTION;  Surgeon: Wellington Hampshire, MD;  Location: [facility];  Service: Cardiovascular;  Laterality: N/A;   [place] and 2016 (2016 ventral w/ incarceration) 33 surgeries   HYSTEROSCOPY WITH D & C     14   LAPAROSCOPIC LYSIS OF ADHESIONS     x2   LEFT HEART CATH AND CORONARY ANGIOGRAPHY N/A 06/18/2019   Procedure: LEFT HEART CATH AND CORONARY ANGIOGRAPHY;  Surgeon: Wellington Hampshire, MD;  Location: [facility];  Service: Cardiovascular;  Laterality: N/A;   LEFT HEART CATH AND CORONARY ANGIOGRAPHY N/A 11/30/2019   Procedure: LEFT HEART CATH AND CORONARY ANGIOGRAPHY;  Surgeon: Troy Sine, MD;  Location: [facility];  Service: Cardiovascular;  Laterality: N/A;   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 She has never used smokeless tobacco. She reports that she does not currently use alcohol. She reports that she does not use drugs.  Allergies  Allergen Reactions   Rofecoxib Anaphylaxis    VIOXX   Metformin And Related     ibs   Benadryl Allergy [Diphenhydramine Hcl] Other (See Comments)    Depressed respirations   Cephalexin Rash   Gatifloxacin Other (See Comments)    Confusion Very low blood pressure [place]    Family History  Problem Relation Age of Onset   Depression Mother     Drug abuse Mother    Mental illness Mother    Learning disabilities Mother    Miscarriages / [place] Mother    Alcohol abuse Mother    Mental illness Father    Depression Father    Heart disease Father    Drug abuse Sister    Depression Sister    Asthma Sister    Alcohol abuse Sister    Early death Sister    Mental illness Son    Hypertension Son    Drug abuse Son    Depression Son    Asthma Son    Alcohol abuse Son    Clotting disorder Son    Heart disease Maternal Grandmother    Diabetes Maternal Grandmother    Arthritis Maternal Grandmother    COPD Maternal Grandmother    Parkinson's disease Maternal Grandmother    Heart disease Maternal Grandfather    Heart attack Maternal Grandfather  Early death Maternal Grandfather    COPD Maternal Grandfather    Heart disease Paternal Grandmother    Heart attack Paternal Grandmother    Depression Paternal Grandmother    Arthritis Paternal Grandmother    Breast cancer Paternal Grandmother    Heart disease Paternal Grandfather    Heart attack Paternal Grandfather    Early death Paternal Grandfather    COPD Paternal Grandfather    Mental illness Half-Brother    Learning disabilities Half-Brother    Hypertension Half-Brother    Hyperlipidemia Half-Brother    Drug abuse Half-Brother    Heart disease Half-Brother    Diabetes Half-Brother    Depression Half-Brother    Asthma Half-Brother    Alcohol abuse Half-Brother    Arthritis Half-Brother    Kidney disease Half-Brother    Testicular cancer Half-Brother    Diabetes Half-Sister    Mental illness Half-Sister    Learning disabilities Half-Sister    Drug abuse Half-Sister    Depression Half-Sister    Alcohol abuse Half-Sister    Thyroid cancer Half-Sister     Prior to Admission medications   Medication Sig Start Date End Date Taking? Authorizing Provider  acetaminophen (TYLENOL) 500 MG tablet Take 500 mg by mouth every 6 (six) hours as needed for mild pain or  moderate pain.    [provider]  albuterol (VENTOLIN HFA) 108 (90 Base) MCG/ACT inhaler Inhale 2 puffs into the lungs every 4 (four) hours as needed for wheezing or shortness of breath. 08/03/21   Laurin Coder, MD  amitriptyline (ELAVIL) 50 MG tablet Take 1 tablet (50 mg total) by mouth at bedtime. 04/05/22   Kuneff, Renee A, DO  amLODipine (NORVASC) 2.5 MG tablet Take 1 tablet (2.5 mg total) by mouth daily. 10/09/21   Almyra Deforest, PA  atorvastatin (LIPITOR) 80 MG tablet Take 1 tablet (80 mg total) by mouth every evening. 10/09/21   Almyra Deforest, PA  budesonide-formoterol [facility]) 80-4.5 MCG/ACT inhaler Inhale 2 puffs into the lungs 2 (two) times daily. Patient not taking: Reported on 07/01/2022 08/03/21   Sherrilyn Rist A, MD  carvedilol (COREG) 6.25 MG tablet TAKE 1 TABLET BY MOUTH IN THE MORNING AND 2 TABLETS IN THE EVENING 10/09/21   Almyra Deforest, PA  Cholecalciferol (VITAMIN D3) 50 MCG (2000 UT) capsule Take 2 capsules (4,000 Units total) by mouth daily. 05/09/21   Kuneff, Renee A, DO  clotrimazole (CLOTRIMAZOLE ANTI-FUNGAL) 1 % cream Apply 1 Application topically 2 (two) times daily. 07/01/22   Kuneff, Renee A, DO  dicyclomine (BENTYL) 20 MG tablet Take 1 tablet (20 mg total) by mouth 4 (four) times daily as needed for spasms. 04/05/22   Kuneff, Renee A, DO  doxycycline (VIBRA-TABS) 100 MG tablet Take 1 tablet (100 mg total) by mouth 2 (two) times daily. 07/01/22   Kuneff, Renee A, DO  famotidine (PEPCID) 40 MG tablet Take 1 tablet (40 mg total) by mouth at bedtime. 04/05/22   Kuneff, Renee A, DO  hydrOXYzine (ATARAX) 25 MG tablet Take 1 tablet (25 mg total) by mouth every 8 (eight) hours as needed for itching. 04/05/22   Kuneff, Renee A, DO  ipratropium-albuterol (DUONEB) 0.5-2.5 (3) MG/3ML SOLN Take 3 mLs by nebulization every 4 (four) hours as needed. Patient not taking: Reported on 07/01/2022 06/01/21   Laurin Coder, MD  isosorbide mononitrate (IMDUR) 60 MG 24 hr tablet TAKE 1  TABLET(60 MG) BY MOUTH DAILY 10/09/21   Almyra Deforest, PA  nitroGLYCERIN (NITROSTAT) 0.4 MG SL tablet  PLACE 1 TABLET UNDER THE TONGUE EVERY 5 MINUTES FOR 3 DOSES AS NEEDED FOR CHEST PAIN 08/30/21   Croitoru, Dani Gobble, MD  ondansetron (ZOFRAN) 4 MG tablet Take 1 tablet (4 mg total) by mouth every 8 (eight) hours as needed for nausea or vomiting. 07/01/22   Kuneff, Renee A, DO  predniSONE (DELTASONE) 20 MG tablet Take 2 tablets (40 mg total) by mouth daily with breakfast. 07/01/22   Kuneff, Renee A, DO  rivaroxaban (XARELTO) 20 MG TABS tablet TAKE 1 TABLET(20 MG) BY MOUTH EVERY EVENING 06/26/22   Croitoru, Dani Gobble, MD    Physical Exam: Vitals:   07/20/22 2132 07/20/22 2200 07/20/22 2230 07/20/22 2256  BP: (!) 90/58 104/63 (!) 107/96   Pulse:  96 93   Resp: _0 Temp:   98.2 F (36.8 C) 98.2 F (36.8 C)  TempSrc:   Oral Oral  SpO2:  99% 99%   Weight:      Height:        Physical Exam Vitals reviewed.  Constitutional:      General: She is not in acute distress. HENT:     Head: Normocephalic and atraumatic.     Mouth/Throat:     Comments: Very dry mucous membranes Eyes:     Extraocular Movements: Extraocular movements intact.  Cardiovascular:     Rate and Rhythm: Normal rate and regular rhythm.     Pulses: Normal pulses.  Pulmonary:     Effort: Pulmonary effort is normal. No respiratory distress.     Breath sounds: Normal breath sounds. No wheezing or rales.  Abdominal:     General: Bowel sounds are normal. There is no distension.     Palpations: Abdomen is soft.     Tenderness: There is abdominal tenderness.     Comments: Generalized tenderness to palpation  Musculoskeletal:        General: No swelling or tenderness.     Cervical back: Normal range of motion.  Skin:    General: Skin is warm and dry.  Neurological:     General: No focal deficit present.     Mental Status: She is alert and oriented to person, place, and time.     Labs on Admission: I have personally reviewed  following labs and imaging studies  CBC: Recent Labs  Lab 07/20/22 1318 07/20/22 1334  WBC 11.9*  --   NEUTROABS 9.9*  --   HGB 15.0 16.3*  HCT 46.0 48.0*  MCV 81.7  --   PLT 280  --    Basic Metabolic Panel: Recent Labs  Lab 07/20/22 1318 07/20/22 1334  NA 137 136  K 4.5 4.4  CL 99 102  CO2 20*  --   GLUCOSE 132* 132*  BUN 12 15  CREATININE 1.51* 1.30*  CALCIUM 9.6  --    GFR: Estimated Creatinine Clearance: 47.1 mL/min (A) (by C-G formula based on SCr of 1.3 mg/dL (H)). Liver Function Tests: Recent Labs  Lab 07/20/22 1318  AST 62*  ALT 26  ALKPHOS 74  BILITOT 2.5*  PROT 6.4*  ALBUMIN 2.7*   No results for input(s): "LIPASE", "AMYLASE" in the last 168 hours. No results for input(s): "AMMONIA" in the last 168 hours. Coagulation Profile: No results for input(s): "INR", "PROTIME" in the last 168 hours. Cardiac Enzymes: Recent Labs  Lab 07/20/22 1318  CKTOTAL 1,651*   BNP (last 3 results) No results for input(s): "PROBNP" in the last 8760 hours. HbA1C: No results for input(s): "HGBA1C" in  the last 72 hours. CBG: Recent Labs  Lab 07/20/22 1324  GLUCAP 142*   Lipid Profile: No results for input(s): "CHOL", "HDL", "LDLCALC", "TRIG", "CHOLHDL", "LDLDIRECT" in the last 72 hours. Thyroid Function Tests: No results for input(s): "TSH", "T4TOTAL", "FREET4", "T3FREE", "THYROIDAB" in the last 72 hours. Anemia Panel: No results for input(s): "VITAMINB12", "FOLATE", "FERRITIN", "TIBC", "IRON", "RETICCTPCT" in the last 72 hours. Urine analysis: No results found for: "COLORURINE", "APPEARANCEUR", "LABSPEC", "PHURINE", "GLUCOSEU", "HGBUR", "BILIRUBINUR", "KETONESUR", "PROTEINUR", "UROBILINOGEN", "NITRITE", "LEUKOCYTESUR"  Radiological Exams on Admission: CT ABDOMEN PELVIS W CONTRAST  Result Date: 07/20/2022 CLINICAL DATA:  Pulmonary embolism (PE) suspected, high prob; LLQ abdominal pain EXAM: CT ANGIOGRAPHY CHEST CT ABDOMEN AND PELVIS WITH CONTRAST TECHNIQUE:  Multidetector CT imaging of the chest was performed using the standard protocol during bolus administration of intravenous contrast. Multiplanar CT image reconstructions and MIPs were obtained to evaluate the vascular anatomy. Multidetector CT imaging of the abdomen and pelvis was performed using the standard protocol during bolus administration of intravenous contrast. RADIATION DOSE REDUCTION: This exam was performed according to the departmental dose-optimization program which includes automated exposure control, adjustment of the mA and/or kV according to patient size and/or use of iterative reconstruction technique. CONTRAST:  76m OMNIPAQUE IOHEXOL 350 MG/ML SOLN COMPARISON:  None Available. FINDINGS: CTA CHEST FINDINGS Cardiovascular: Satisfactory opacification of the pulmonary arteries to the segmental level. No evidence of pulmonary embolism. normal heart size. No significant pericardial effusion. The thoracic aorta is normal in caliber. Mild atherosclerotic plaque of the thoracic aorta. At least 3 vessel coronary artery calcifications. Mediastinum/Nodes: No enlarged mediastinal, hilar, or axillary lymph nodes. Thyroid gland, trachea, and esophagus demonstrate no significant findings. Small volume hiatal hernia. Lungs/Pleura: Low lung volumes. No focal consolidation. 4 mm subpleural micronodule within the right middle lobe (7:41). No pulmonary mass. No pleural effusion. No pneumothorax. Musculoskeletal: No chest wall abnormality. No suspicious lytic or blastic osseous lesions. No acute displaced fracture. Multilevel degenerative changes of the spine. Review of the MIP images confirms the above findings. CT ABDOMEN and PELVIS FINDINGS Hepatobiliary: No focal liver abnormality. Status post cholecystectomy. Common bile duct dilatation which can be seen in the post cholecystectomy setting. Pancreas: Diffusely atrophic. No focal lesion. Otherwise normal pancreatic contour. No surrounding inflammatory changes. No  main pancreatic ductal dilatation. Spleen: Normal in size without focal abnormality. Adrenals/Urinary Tract: No adrenal nodule bilaterally. Bilateral kidneys enhance symmetrically. No hydronephrosis. No hydroureter. Bilateral parapelvic fluid density lesions likely representing simple renal cysts. Simple renal cysts, in the absence of clinically indicated signs/symptoms, require no independent follow-up. The urinary bladder is unremarkable. Stomach/Bowel: Stomach is within normal limits. No evidence of bowel wall thickening or dilatation. Second portion of the duodenum diverticula. Colonic diverticulosis. The appendix is not definitely identified with no inflammatory changes in the right lower quadrant to suggest acute appendicitis. Vascular/Lymphatic: No abdominal aorta or iliac aneurysm. Moderate to severe atherosclerotic plaque of the aorta and its branches. No abdominal, pelvic, or inguinal lymphadenopathy. Reproductive: Status post hysterectomy. No adnexal masses. Other: Nonspecific slight fat stranding within the left abdomen/pelvis (6:47, 3:78) along the proximal sigmoid colon. No intraperitoneal free fluid. No intraperitoneal free gas. No organized fluid collection. Musculoskeletal: Diastasis rectus. Small shallow supraumbilical ventral wall hernia with a non abdominal defect of 7.5 cm containing couple short loops of small bowel as well as mesenteric fat (3:63, [id number]. Large infraumbilical ventral hernia containing several loops of small bowel and large bowel as well as mesentery with an abdominal defect of 13 x 13 cm. No  suspicious lytic or blastic osseous lesions. No acute displaced fracture. Multilevel degenerative changes of the spine. L1 chronic appearing compression fracture. Review of the MIP images confirms the above findings. IMPRESSION: 1. No pulmonary embolus. 2. Low lung volumes with no acute intrathoracic abnormality. 3. Small hiatal hernia. 4. Colonic diverticulosis. Nonspecific slight fat  stranding within the left abdomen/pelvis along the proximal sigmoid colon. No associated bowel wall thickening. Finding may represent resolving or developing mild acute diverticulitis. 5. Supraumbilical and infraumbilical ventral hernias containing bowel with large abdominal defect as well as associated diastasis rectus. No findings suggest associated bowel obstruction or ischemia. Electronically Signed   By: Iven Finn M.D.   On: 07/20/2022 20:12   CT Angio Chest PE W and/or Wo Contrast  Result Date: 07/20/2022 CLINICAL DATA:  Pulmonary embolism (PE) suspected, high prob; LLQ abdominal pain EXAM: CT ANGIOGRAPHY CHEST CT ABDOMEN AND PELVIS WITH CONTRAST TECHNIQUE: Multidetector CT imaging of the chest was performed using the standard protocol during bolus administration of intravenous contrast. Multiplanar CT image reconstructions and MIPs were obtained to evaluate the vascular anatomy. Multidetector CT imaging of the abdomen and pelvis was performed using the standard protocol during bolus administration of intravenous contrast. RADIATION DOSE REDUCTION: This exam was performed according to the departmental dose-optimization program which includes automated exposure control, adjustment of the mA and/or kV according to patient size and/or use of iterative reconstruction technique. CONTRAST:  60m OMNIPAQUE IOHEXOL 350 MG/ML SOLN COMPARISON:  None Available. FINDINGS: CTA CHEST FINDINGS Cardiovascular: Satisfactory opacification of the pulmonary arteries to the segmental level. No evidence of pulmonary embolism. normal heart size. No significant pericardial effusion. The thoracic aorta is normal in caliber. Mild atherosclerotic plaque of the thoracic aorta. At least 3 vessel coronary artery calcifications. Mediastinum/Nodes: No enlarged mediastinal, hilar, or axillary lymph nodes. Thyroid gland, trachea, and esophagus demonstrate no significant findings. Small volume hiatal hernia. Lungs/Pleura: Low lung  volumes. No focal consolidation. 4 mm subpleural micronodule within the right middle lobe (7:41). No pulmonary mass. No pleural effusion. No pneumothorax. Musculoskeletal: No chest wall abnormality. No suspicious lytic or blastic osseous lesions. No acute displaced fracture. Multilevel degenerative changes of the spine. Review of the MIP images confirms the above findings. CT ABDOMEN and PELVIS FINDINGS Hepatobiliary: No focal liver abnormality. Status post cholecystectomy. Common bile duct dilatation which can be seen in the post cholecystectomy setting. Pancreas: Diffusely atrophic. No focal lesion. Otherwise normal pancreatic contour. No surrounding inflammatory changes. No main pancreatic ductal dilatation. Spleen: Normal in size without focal abnormality. Adrenals/Urinary Tract: No adrenal nodule bilaterally. Bilateral kidneys enhance symmetrically. No hydronephrosis. No hydroureter. Bilateral parapelvic fluid density lesions likely representing simple renal cysts. Simple renal cysts, in the absence of clinically indicated signs/symptoms, require no independent follow-up. The urinary bladder is unremarkable. Stomach/Bowel: Stomach is within normal limits. No evidence of bowel wall thickening or dilatation. Second portion of the duodenum diverticula. Colonic diverticulosis. The appendix is not definitely identified with no inflammatory changes in the right lower quadrant to suggest acute appendicitis. Vascular/Lymphatic: No abdominal aorta or iliac aneurysm. Moderate to severe atherosclerotic plaque of the aorta and its branches. No abdominal, pelvic, or inguinal lymphadenopathy. Reproductive: Status post hysterectomy. No adnexal masses. Other: Nonspecific slight fat stranding within the left abdomen/pelvis (6:47, 3:78) along the proximal sigmoid colon. No intraperitoneal free fluid. No intraperitoneal free gas. No organized fluid collection. Musculoskeletal: Diastasis rectus. Small shallow supraumbilical  ventral wall hernia with a non abdominal defect of 7.5 cm containing couple short loops of small bowel  as well as mesenteric fat (3:63, [id number]). Large infraumbilical ventral hernia containing several loops of small bowel and large bowel as well as mesentery with an abdominal defect of 13 x 13 cm. No suspicious lytic or blastic osseous lesions. No acute displaced fracture. Multilevel degenerative changes of the spine. L1 chronic appearing compression fracture. Review of the MIP images confirms the above findings. IMPRESSION: 1. No pulmonary embolus. 2. Low lung volumes with no acute intrathoracic abnormality. 3. Small hiatal hernia. 4. Colonic diverticulosis. Nonspecific slight fat stranding within the left abdomen/pelvis along the proximal sigmoid colon. No associated bowel wall thickening. Finding may represent resolving or developing mild acute diverticulitis. 5. Supraumbilical and infraumbilical ventral hernias containing bowel with large abdominal defect as well as associated diastasis rectus. No findings suggest associated bowel obstruction or ischemia. Electronically Signed   By: Iven Finn M.D.   On: 07/20/2022 20:12   CT Lumbar Spine Wo Contrast  Result Date: 07/20/2022 CLINICAL DATA:  Found down. EXAM: CT THORACIC AND LUMBAR SPINE WITHOUT CONTRAST TECHNIQUE: Multidetector CT imaging of the thoracic and lumbar spine was performed without contrast. Multiplanar CT image reconstructions were also generated. RADIATION DOSE REDUCTION: This exam was performed according to the departmental dose-optimization program which includes automated exposure control, adjustment of the mA and/or kV according to patient size and/or use of iterative reconstruction technique. COMPARISON:  None Available. FINDINGS: CT THORACIC SPINE FINDINGS Alignment: Normal Vertebrae: No acute fracture or bone lesion. Mild compression deformity T11 appears remote. No posterior rib fractures are identified. Paraspinal and other soft  tissues: No significant findings. Moderate-sized hiatal hernia. Dependent bibasilar atelectasis. Coronary artery and aortic calcifications. Disc levels: No large disc protrusions, spinal foraminal stenosis. CT LUMBAR SPINE FINDINGS Segmentation: There are five lumbar type vertebral bodies. The last full intervertebral disc space is labeled L5-S1. Alignment: Normal Vertebrae: L1 compression fracture appears remote. No acute fracture lines or paraspinal hematoma. No bone lesions. Bilateral pars defects noted at L5 with minimal anterolisthesis. Paraspinal and other soft tissues: Advanced aortic calcifications but no aneurysm. Disc levels: L1-2: No significant findings. L2-3: Advanced facet disease but no spinal or foraminal stenosis. L3-4: Advanced facet disease and mild bulging annulus but no significant spinal or foraminal stenosis. L4-5: Severe facet disease and despite prior laminectomy there is moderately severe spinal and bilateral lateral recess stenosis. L5-S1: Bulging uncovered disc but generous spinal canal. Wide decompressive laminectomy. No spinal or foraminal stenosis. IMPRESSION: 1. No acute thoracic or lumbar spine fracture. Remote appearing T11 and L1 fractures. 2. Postoperative changes in the lower lumbar spine with laminectomy defects at L4-5 and L5-S1. 3. 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 4. Bilateral pars defects at L5. 5. Aortic atherosclerosis. Aortic Atherosclerosis (ICD10-I70.0). Electronically Signed   By: Marijo Sanes M.D.   On: 07/20/2022 17:04   CT Thoracic Spine Wo Contrast  Result Date: 07/20/2022 CLINICAL DATA:  Found down. EXAM: CT THORACIC AND LUMBAR SPINE WITHOUT CONTRAST TECHNIQUE: Multidetector CT imaging of the thoracic and lumbar spine was performed without contrast. Multiplanar CT image reconstructions were also generated. RADIATION DOSE REDUCTION: This exam was performed according to the departmental dose-optimization program which  includes automated exposure control, adjustment of the mA and/or kV according to patient size and/or use of iterative reconstruction technique. COMPARISON:  None Available. FINDINGS: CT THORACIC SPINE FINDINGS Alignment: Normal Vertebrae: No acute fracture or bone lesion. Mild compression deformity T11 appears remote. No posterior rib fractures are identified. Paraspinal and other soft tissues: No significant findings. Moderate-sized  hiatal hernia. Dependent bibasilar atelectasis. Coronary artery and aortic calcifications. Disc levels: No large disc protrusions, spinal foraminal stenosis. CT LUMBAR SPINE FINDINGS Segmentation: There are five lumbar type vertebral bodies. The last full intervertebral disc space is labeled L5-S1. Alignment: Normal Vertebrae: L1 compression fracture appears remote. No acute fracture lines or paraspinal hematoma. No bone lesions. Bilateral pars defects noted at L5 with minimal anterolisthesis. Paraspinal and other soft tissues: Advanced aortic calcifications but no aneurysm. Disc levels: L1-2: No significant findings. L2-3: Advanced facet disease but no spinal or foraminal stenosis. L3-4: Advanced facet disease and mild bulging annulus but no significant spinal or foraminal stenosis. L4-5: Severe facet disease and despite prior laminectomy there is moderately severe spinal and bilateral lateral recess stenosis. L5-S1: Bulging uncovered disc but generous spinal canal. Wide decompressive laminectomy. No spinal or foraminal stenosis. IMPRESSION: 1. No acute thoracic or lumbar spine fracture. Remote appearing T11 and L1 fractures. 2. Postoperative changes in the lower lumbar spine with laminectomy defects at L4-5 and L5-S1. 3. 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 4. Bilateral pars defects at L5. 5. Aortic atherosclerosis. Aortic Atherosclerosis (ICD10-I70.0). Electronically Signed   By: Marijo Sanes M.D.   On: 07/20/2022 17:04   CT Head Wo  Contrast  Result Date: 07/20/2022 CLINICAL DATA:  71 year old female with fall. EXAM: CT HEAD WITHOUT CONTRAST CT CERVICAL SPINE WITHOUT CONTRAST TECHNIQUE: Multidetector CT imaging of the head and cervical spine was performed following the standard protocol without intravenous contrast. Multiplanar CT image reconstructions of the cervical spine were also generated. RADIATION DOSE REDUCTION: This exam was performed according to the departmental dose-optimization program which includes automated exposure control, adjustment of the mA and/or kV according to patient size and/or use of iterative reconstruction technique. COMPARISON:  None Available. FINDINGS: CT HEAD FINDINGS Brain: Age indeterminate infarct versus chronic ischemic changes in the RIGHT basal ganglia noted. No other infarct, hemorrhage, mass lesion or mass effect, hydrocephalus or extra-axial collection noted. Mild atrophy and chronic small-vessel white matter ischemic changes noted. Vascular: Carotid and vertebral atherosclerotic calcifications are noted. Skull: Normal. Negative for fracture or focal lesion. Sinuses/Orbits: No acute finding. Other: None. CT CERVICAL SPINE FINDINGS Alignment: Normal. Skull base and vertebrae: No acute fracture. No primary bone lesion or focal pathologic process. Soft tissues and spinal canal: No prevertebral fluid or swelling. No visible canal hematoma. Disc levels: Mild multilevel facet arthropathy noted contributing to mild bony foraminal narrowing throughout the cervical spine. Disc spaces are relatively maintained. Upper chest: No acute abnormality Other: None IMPRESSION: 1. Age indeterminate infarct versus chronic ischemic changes in the RIGHT basal ganglia. No other evidence of acute intracranial abnormality. No evidence of hemorrhage. 2. No evidence of acute injury to the cervical spine. 3. Mild atrophy and chronic small-vessel white matter ischemic changes. Electronically Signed   By: Margarette Canada M.D.   On:  07/20/2022 16:13   CT Cervical Spine Wo Contrast  Result Date: 07/20/2022 CLINICAL DATA:  71 year old female with fall. EXAM: CT HEAD WITHOUT CONTRAST CT CERVICAL SPINE WITHOUT CONTRAST TECHNIQUE: Multidetector CT imaging of the head and cervical spine was performed following the standard protocol without intravenous contrast. Multiplanar CT image reconstructions of the cervical spine were also generated. RADIATION DOSE REDUCTION: This exam was performed according to the departmental dose-optimization program which includes automated exposure control, adjustment of the mA and/or kV according to patient size and/or use of iterative reconstruction technique. COMPARISON:  None Available. FINDINGS: CT HEAD FINDINGS Brain: Age indeterminate infarct versus chronic  ischemic changes in the RIGHT basal ganglia noted. No other infarct, hemorrhage, mass lesion or mass effect, hydrocephalus or extra-axial collection noted. Mild atrophy and chronic small-vessel white matter ischemic changes noted. Vascular: Carotid and vertebral atherosclerotic calcifications are noted. Skull: Normal. Negative for fracture or focal lesion. Sinuses/Orbits: No acute finding. Other: None. CT CERVICAL SPINE FINDINGS Alignment: Normal. Skull base and vertebrae: No acute fracture. No primary bone lesion or focal pathologic process. Soft tissues and spinal canal: No prevertebral fluid or swelling. No visible canal hematoma. Disc levels: Mild multilevel facet arthropathy noted contributing to mild bony foraminal narrowing throughout the cervical spine. Disc spaces are relatively maintained. Upper chest: No acute abnormality Other: None IMPRESSION: 1. Age indeterminate infarct versus chronic ischemic changes in the RIGHT basal ganglia. No other evidence of acute intracranial abnormality. No evidence of hemorrhage. 2. No evidence of acute injury to the cervical spine. 3. Mild atrophy and chronic small-vessel white matter ischemic changes.  Electronically Signed   By: Margarette Canada M.D.   On: 07/20/2022 16:13   DG Chest 2 View  Result Date: 07/20/2022 CLINICAL DATA:  Fall, left rib pain EXAM: CHEST - 2 VIEW COMPARISON:  06/17/2019 FINDINGS: Low volume portable examination. Cardiomegaly. Mild, diffuse bilateral interstitial pulmonary opacity and bronchovascular crowding. No obvious displaced rib fracture. IMPRESSION: 1. Low volume portable examination. 2. Cardiomegaly. 3. Mild, diffuse bilateral interstitial pulmonary opacity and bronchovascular crowding, likely edema. 4. No obvious displaced rib fracture or pneumothorax. CT may be helpful to further evaluate if fracture is suspected. Electronically Signed   By: Delanna Ahmadi M.D.   On: 07/20/2022 15:17   DG Elbow Complete Left  Result Date: 07/20/2022 CLINICAL DATA:  Pain after fall EXAM: LEFT ELBOW - COMPLETE 3+ VIEW COMPARISON:  None Available. FINDINGS: No acute fracture or dislocation in the left elbow. Mild degenerative arthritis. No elbow joint effusion. No significant soft tissue abnormality. IMPRESSION: No acute fracture or dislocation. Electronically Signed   By: Placido Sou M.D.   On: 07/20/2022 15:16   DG Shoulder Left  Result Date: 07/20/2022 CLINICAL DATA:  Fall.  Pain. EXAM: LEFT SHOULDER - 2+ VIEW COMPARISON:  None Available. FINDINGS: There is no evidence of fracture or dislocation. There is no evidence of arthropathy or other focal bone abnormality. Soft tissues are unremarkable. IMPRESSION: Negative. Electronically Signed   By: Misty Stanley M.D.   On: 07/20/2022 15:15   DG Tibia/Fibula Left  Result Date: 07/20/2022 CLINICAL DATA:  Fall.  Pain. EXAM: LEFT TIBIA AND FIBULA - 2 VIEW COMPARISON:  None Available. FINDINGS: There is no evidence of fracture or other focal bone lesions. Soft tissues are unremarkable. IMPRESSION: Negative. Electronically Signed   By: Misty Stanley M.D.   On: 07/20/2022 15:15   DG Hip Unilat W or Wo Pelvis 2-3 Views Left  Result Date:  07/20/2022 CLINICAL DATA:  Hip pain after fall EXAM: DG HIP (WITH OR WITHOUT PELVIS) 2-3V LEFT COMPARISON:  None Available. FINDINGS: No acute fracture or dislocation in the pelvis. Degenerative changes pubic symphysis, both hips, SI joints and lower lumbar spine. IMPRESSION: No acute fracture or dislocation. Electronically Signed   By: Placido Sou M.D.   On: 07/20/2022 15:14    EKG: Independently reviewed.  Sinus tachycardia, QTc 484.  Borderline T wave abnormalities seen on prior EKGs as well.  Assessment and Plan  Rhabdomyolysis Patient fell and was down on the floor for about 3 days. CK 1651. -Continue IV fluid hydration -Trend CK  AKI on CKD stage  IIIa Likely secondary to rhabdomyolysis and severe dehydration.  Creatinine 1.5, baseline 0.9-1.3.  CT without evidence of obstructive uropathy. -Continue IV fluid hydration -Monitor renal function and urine output -Avoid nephrotoxic agents  Acute on chronic hypoxemic respiratory failure Patient has been out of her home oxygen for 2 to 3 weeks. Initial oxygen saturation noted to be 64% on room air.  Currently stable on 3 L O2 which is her baseline.  CTA chest negative for PE or any other acute pulmonary abnormality. -Continuous pulse ox -Continue supplemental oxygen  ?Mild acute diverticulitis CT showing nonspecific slight fat stranding within the left abdomen/pelvis along the sigmoid colon.  No associated bowel wall thickening.  Radiologist felt that this may represent resolving or developing mild acute diverticulitis.  Patient is endorsing bilateral lower quadrant abdominal pain.  On exam, she has generalized tenderness to palpation. WBC count 11.9, does not meet any other SIRS criteria at this time. -Patient is allergic to cephalosporins (rash) and also allergic to Benadryl (depressed respirations).In addition, allergic to gatifloxacin (confusion and very low blood pressure).  Discussed with pharmacy, best to avoid penicillins and  cephalosporins at this time.  Levofloxacin can be considered but best to avoid at this time given soft blood pressure.  Will cover with metronidazole. -Pain management -Bowel rest at this time -Monitor WBC count  Ventral hernias CT showing supraumbilical and infraumbilical ventral hernias containing bowel with large abdominal defect as well as associated diastasis rectus.  No findings to suggest associated bowel obstruction or ischemia. -Outpatient general surgery follow-up.  Abnormal CT head finding CT head showing age-indeterminate infarct versus chronic ischemic changes in the right basal ganglia.  No other evidence of acute intracranial abnormality and no evidence of hemorrhage.  Patient does not have any focal neurologic deficits on exam. -Brain MRI ordered for further evaluation  Fall at home No traumatic injuries identified on imaging done in the ED. -PT/OT eval, fall precautions  Mild high anion gap metabolic acidosis Likely secondary to AKI. -Continue to monitor and replace bicarb if acidosis worsens  Mild elevation of liver enzymes Likely secondary to rhabdomyolysis. AST 62, T. bili 2.5 (sample hemolyzed), ALT and alk phos normal.  Status post cholecystectomy and CT showing common bile duct dilation which radiologist feels can be seen in the post cholecystectomy setting.  No focal liver abnormality seen.  Patient is not endorsing right upper quadrant abdominal pain. -Monitor LFTs  Hypertension -Hold antihypertensives at this time and continue IV fluid hydration given soft blood pressure.  Type 2 diabetes A1c 7.1 on 04/05/2022. -Sensitive sliding scale insulin ACHS -Pharmacy med rec pending  Hyperlipidemia -Hold statin given rhabdomyolysis  History of recurrent DVTs -Continue Xarelto after pharmacy med rec is done.  Asthma/COPD: No signs of acute exacerbation. CAD status post PCI: EKG without acute ischemic changes and patient is not endorsing chest  pain. Depression GERD -Pharmacy med rec pending.   DVT prophylaxis: Continue Xarelto after pharmacy med rec is done. Code Status: Full Code (discussed with the patient) Family Communication: No family available at this time. Level of care: Med-Surg Admission status: It is my clinical opinion that admission to INPATIENT is reasonable and necessary because of the expectation that this patient will require hospital care that crosses at least 2 midnights to treat this condition based on the medical complexity of the problems presented.  Given the aforementioned information, the predictability of an adverse outcome is felt to be significant.   Shela Leff MD [facility]  If 7PM-7AM, please contact  night-coverage [email or web address]  07/20/2022, 10:59 PM

## 2022-07-20 NOTE — ED Notes (Signed)
Mary Ferguson [phone] would like an update asap

## 2022-07-20 NOTE — ED Notes (Signed)
Pts son Gerilyn Pilgrim updated

## 2022-07-21 ENCOUNTER — Inpatient Hospital Stay (HOSPITAL_COMMUNITY): Payer: Medicare Other

## 2022-07-21 DIAGNOSIS — N179 Acute kidney failure, unspecified: Secondary | ICD-10-CM

## 2022-07-21 DIAGNOSIS — T796XXA Traumatic ischemia of muscle, initial encounter: Secondary | ICD-10-CM | POA: Diagnosis not present

## 2022-07-21 DIAGNOSIS — W19XXXA Unspecified fall, initial encounter: Secondary | ICD-10-CM

## 2022-07-21 DIAGNOSIS — N189 Chronic kidney disease, unspecified: Secondary | ICD-10-CM | POA: Insufficient documentation

## 2022-07-21 DIAGNOSIS — M6282 Rhabdomyolysis: Secondary | ICD-10-CM

## 2022-07-21 DIAGNOSIS — R748 Abnormal levels of other serum enzymes: Secondary | ICD-10-CM | POA: Insufficient documentation

## 2022-07-21 DIAGNOSIS — E8729 Other acidosis: Secondary | ICD-10-CM | POA: Insufficient documentation

## 2022-07-21 HISTORY — DX: Rhabdomyolysis: M62.82

## 2022-07-21 HISTORY — DX: Chronic kidney disease, unspecified: N18.9

## 2022-07-21 HISTORY — DX: Other acidosis: E87.29

## 2022-07-21 HISTORY — DX: Chronic kidney disease, unspecified: N17.9

## 2022-07-21 HISTORY — DX: Unspecified place in unspecified non-institutional (private) residence as the place of occurrence of the external cause: W19.XXXA

## 2022-07-21 LAB — CK: Total CK: 477 U/L — ABNORMAL HIGH (ref 38–234)

## 2022-07-21 LAB — COMPREHENSIVE METABOLIC PANEL
ALT: 16 U/L (ref 0–44)
AST: 34 U/L (ref 15–41)
Albumin: 1.6 g/dL — ABNORMAL LOW (ref 3.5–5.0)
Alkaline Phosphatase: 49 U/L (ref 38–126)
Anion gap: 13 (ref 5–15)
BUN: 12 mg/dL (ref 8–23)
CO2: 19 mmol/L — ABNORMAL LOW (ref 22–32)
Calcium: 8.4 mg/dL — ABNORMAL LOW (ref 8.9–10.3)
Chloride: 105 mmol/L (ref 98–111)
Creatinine, Ser: 1.08 mg/dL — ABNORMAL HIGH (ref 0.44–1.00)
GFR, Estimated: 55 mL/min — ABNORMAL LOW (ref >60)
Glucose, Bld: 68 mg/dL — ABNORMAL LOW (ref 70–99)
Potassium: 4.3 mmol/L (ref 3.5–5.1)
Sodium: 137 mmol/L (ref 135–145)
Total Bilirubin: 1.6 mg/dL — ABNORMAL HIGH (ref 0.3–1.2)
Total Protein: 4.3 g/dL — ABNORMAL LOW (ref 6.5–8.1)

## 2022-07-21 LAB — CBC
HCT: 44 % (ref 36.0–46.0)
Hemoglobin: 13.6 g/dL (ref 12.0–15.0)
MCH: 26.5 pg (ref 26.0–34.0)
MCHC: 30.9 g/dL (ref 30.0–36.0)
MCV: 85.6 fL (ref 80.0–100.0)
Platelets: 258 10*3/uL (ref 150–400)
RBC: 5.14 MIL/uL — ABNORMAL HIGH (ref 3.87–5.11)
RDW: 17.2 % — ABNORMAL HIGH (ref 11.5–15.5)
WBC: 9.9 10*3/uL (ref 4.0–10.5)
nRBC: 0 % (ref 0.0–0.2)

## 2022-07-21 LAB — GLUCOSE, CAPILLARY: Glucose-Capillary: 84 mg/dL (ref 70–99)

## 2022-07-21 LAB — CBG MONITORING, ED
Glucose-Capillary: 106 mg/dL — ABNORMAL HIGH (ref 70–99)
Glucose-Capillary: 82 mg/dL (ref 70–99)
Glucose-Capillary: 72 mg/dL (ref 70–99)
Glucose-Capillary: 72 mg/dL (ref 70–99)

## 2022-07-21 LAB — HIV ANTIBODY (ROUTINE TESTING W REFLEX): HIV Screen 4th Generation wRfx: NONREACTIVE

## 2022-07-21 MED ORDER — NALOXONE HCL 0.4 MG/ML IJ SOLN: 0.4000 mg | INTRAMUSCULAR | Status: DC | PRN

## 2022-07-21 MED ORDER — SODIUM CHLORIDE 0.9 % IV SOLN
3.0000 g | Freq: Three times a day (TID) | INTRAVENOUS | Status: DC
  Administered 2022-07-21 – 2022-07-24 (×9): 3 g via INTRAVENOUS
  Filled 2022-07-21 (×9): qty 8

## 2022-07-21 MED ORDER — MORPHINE SULFATE (PF) 2 MG/ML IV SOLN: 1.0000 mg | INTRAVENOUS | Status: DC | PRN

## 2022-07-21 MED ORDER — SODIUM CHLORIDE 0.9 % IV SOLN
3.0000 g | Freq: Once | INTRAVENOUS | Status: AC
  Administered 2022-07-21: 3 g via INTRAVENOUS
  Filled 2022-07-21: qty 8

## 2022-07-21 MED ORDER — FENTANYL CITRATE PF 50 MCG/ML IJ SOSY: 12.5000 ug | PREFILLED_SYRINGE | INTRAMUSCULAR | Status: DC | PRN

## 2022-07-21 MED ORDER — LACTATED RINGERS IV SOLN: INTRAVENOUS | Status: AC

## 2022-07-21 MED ORDER — SODIUM CHLORIDE 0.9 % IV SOLN: INTRAVENOUS | Status: DC

## 2022-07-21 MED ORDER — LEVOFLOXACIN IN D5W 500 MG/100ML IV SOLN: 500.0000 mg | INTRAVENOUS | Status: DC

## 2022-07-21 MED ORDER — SODIUM CHLORIDE 0.9 % IV BOLUS
500.0000 mL | Freq: Once | INTRAVENOUS | Status: AC
  Administered 2022-07-21: 500 mL via INTRAVENOUS

## 2022-07-21 MED ORDER — METRONIDAZOLE 500 MG/100ML IV SOLN
500.0000 mg | Freq: Two times a day (BID) | INTRAVENOUS | Status: DC
  Administered 2022-07-21 (×2): 500 mg via INTRAVENOUS
  Filled 2022-07-21 (×2): qty 100

## 2022-07-21 MED ORDER — ACETAMINOPHEN 325 MG PO TABS
650.0000 mg | ORAL_TABLET | Freq: Four times a day (QID) | ORAL | Status: DC
  Administered 2022-07-21 – 2022-07-25 (×15): 650 mg via ORAL
  Filled 2022-07-21 (×16): qty 2

## 2022-07-21 MED ORDER — LEVOFLOXACIN IN D5W 500 MG/100ML IV SOLN
500.0000 mg | Freq: Once | INTRAVENOUS | Status: DC
  Filled 2022-07-21: qty 100

## 2022-07-21 MED ORDER — INSULIN ASPART 100 UNIT/ML IJ SOLN
0.0000 [IU] | Freq: Three times a day (TID) | INTRAMUSCULAR | Status: DC
  Administered 2022-07-23 (×2): 1 [IU] via SUBCUTANEOUS
  Administered 2022-07-23: 2 [IU] via SUBCUTANEOUS
  Administered 2022-07-24 (×2): 1 [IU] via SUBCUTANEOUS

## 2022-07-21 MED ORDER — INSULIN ASPART 100 UNIT/ML IJ SOLN: 0.0000 [IU] | Freq: Every day | INTRAMUSCULAR | Status: DC

## 2022-07-21 MED ORDER — ENOXAPARIN SODIUM 40 MG/0.4ML IJ SOSY
40.0000 mg | PREFILLED_SYRINGE | INTRAMUSCULAR | Status: DC
  Administered 2022-07-21: 40 mg via SUBCUTANEOUS
  Filled 2022-07-21: qty 0.4

## 2022-07-21 NOTE — ED Notes (Signed)
Bed/ pad not wet, nothing in urine suction canister, no urine output today thus far, bladder scan at this time reading 70ml, admitting messaged and paged

## 2022-07-21 NOTE — Progress Notes (Signed)
Pharmacy Antibiotic Note  Mary Ferguson is a 71 y.o. female admitted on 07/20/2022 with  intra-abdominal infxn .  Pharmacy has been consulted for unasyn dosing.  WBC 9.9/afeb  Plan: Unasyn 3g IV q8h  F/u renal func, LOT, de-escalation opportunities  Height: 5\' 2"  (157.5 cm) Weight: 110 kg (242 lb 8.1 oz) IBW/kg (Calculated) : 50.1  Temp (24hrs), Avg:97.6 F (36.4 C), Min:96 F (35.6 C), Max:98.3 F (36.8 C)  Recent Labs  Lab 07/20/22 1318 07/20/22 1334 07/21/22 0515 07/21/22 0600  WBC 11.9*  --  9.9  --   CREATININE 1.51* 1.30*  --  1.08*    Estimated Creatinine Clearance: 56.7 mL/min (A) (by C-G formula based on SCr of 1.08 mg/dL (H)).    Allergies  Allergen Reactions   Rofecoxib Anaphylaxis    VIOXX   Metformin And Related Other (See Comments)    Ibs    Benadryl Allergy [Diphenhydramine Hcl] Other (See Comments)    Depressed respirations   Cephalexin Rash   Gatifloxacin Other (See Comments)    Confusion Very low blood pressure [place]    Antimicrobials this admission: 11/26 unasyn>  Dose adjustments this admission:   Microbiology results:   Thank you for allowing pharmacy to be a part of this patient's care.   12/26, PharmD Clinical Pharmacist 07/21/2022 1:41 PM

## 2022-07-21 NOTE — ED Notes (Signed)
Family at [facility], report called to primary/CN [id number], updated, no changes, IVF infusing.

## 2022-07-21 NOTE — ED Notes (Signed)
Pt transported to MRI 

## 2022-07-21 NOTE — ED Provider Notes (Signed)
  Physical Exam  BP (!) 91/56   Pulse 93   Temp 98.2 F (36.8 C) (Oral)   Resp 16   Ht 5\' 2"  (1.575 m)   Wt 110 kg   SpO2 99%   BMI 44.35 kg/m   Physical Exam Vitals and nursing note reviewed.  Constitutional:      General: She is not in acute distress.    Appearance: She is well-developed.  HENT:     Head: Normocephalic and atraumatic.  Eyes:     Conjunctiva/sclera: Conjunctivae normal.  Cardiovascular:     Rate and Rhythm: Normal rate and regular rhythm.     Heart sounds: No murmur heard. Pulmonary:     Effort: Pulmonary effort is normal. No respiratory distress.     Breath sounds: Normal breath sounds.  Abdominal:     Palpations: Abdomen is soft.     Tenderness: There is abdominal tenderness.  Musculoskeletal:        General: No swelling.     Cervical back: Neck supple.  Skin:    General: Skin is warm and dry.     Capillary Refill: Capillary refill takes less than 2 seconds.  Neurological:     Mental Status: She is alert.  Psychiatric:        Mood and Affect: Mood normal.    Procedures  .Critical Care  Performed by: , MD Authorized by: Glendora Score, MD   Critical care provider statement:    Critical care time (minutes):  30   Critical care was necessary to treat or prevent imminent or life-threatening deterioration of the following conditions:  Dehydration   Critical care was time spent personally by me on the following activities:  Development of treatment plan with patient or surrogate, discussions with consultants, evaluation of patient's response to treatment, examination of patient, ordering and review of laboratory studies, ordering and review of radiographic studies, ordering and performing treatments and interventions, pulse oximetry, re-evaluation of patient's condition and review of old charts   ED Course / MDM   Clinical Course as of 07/21/22 0035  Sat Jul 20, 2022  1504 Jul 22, 2022, on floor for 2-3 days, pending imaging [MK]     Clinical Course User Index [MK] Larey Seat, MD   Medical Decision Making Amount and/or Complexity of Data Reviewed Labs: ordered. Radiology: ordered.  Risk Prescription drug management. Decision regarding hospitalization.   Patient received in handoff.  Ran out of home oxygen and found down, on the ground for greater than 48 hours.  Currently pending imaging studies.  Initial imaging studies largely unremarkable but on my reevaluation, patient very tender in the abdomen and pressures remain soft.  Additional fluid resuscitation was administered and CT imaging of the chest and abdomen were obtained that show possible developing mild acute diverticulitis as well as supraumbilical and infraumbilical ventral hernias with no obstruction or ischemia.  Urinalysis with 6-10 white blood cells and many bacteria but negative nitrites and leuk esterase.  Antibiotics ordered for diverticulitis and due to persistent hypotension likely secondary to dehydration and new AKI, patient require hospital admission       Glendora Score, MD 07/21/22 [phone]

## 2022-07-21 NOTE — Progress Notes (Signed)
[facility] Progress Note  Patient: Mary Ferguson     CNO:[id number]  DOA: 07/20/2022   PCP: Natalia Leatherwood, DO       Brief hospital course: This is a 71 year old female with COPD/asthma and chronic hypoxemic respiratory failure who is on 3 L of oxygen at baseline, diabetes mellitus type 2, coronary artery disease, hypertension, recurrent DVTs, class III obesity and CKD stage III who presents to the hospital after a fall.  The patient fell 2 to 3 days prior to presenting to the hospital and laid on the floor.  Friend went to check on her and she was found to be without her oxygen on.  Pulse ox was 64% and the patient was tachycardic tachypneic and hypothermic.  Work-up revealed a WBC count of 11.9, metabolic acidosis with bicarb 20 and anion gap 18, mildly elevated LFTs and a CK of 1651.  CT head, C-spine, T-spine and L-spine were performed and revealed: - Age indeterminant infarct versus chronic ischemic changes in the right basal ganglia - Laminectomy at L4-L5 and L5-S1 - Remote T11 and L1 fractures  No acute fractures were noted.  The patient was given IV fluid boluses and then started on continuous IV fluids.  Subjective:  Complaints of chronic pain in back and abdomen. No nausea or vomiting.  Assessment and Plan: Principal Problem:   Rhabdomyolysis -2ndary to laying on the floor - CK improving  Active Problems: Mild AKI and metabolic acidosis - Creatinine 1.51 - Has improved to 1.08 - Bicarb 19  Possible acute diverticulitis - CT revealed stranding proximal sigmoid colon - no acute increase in pain -  on Flagyl- has allergy to flouroquinolones and Cephalosporins - change to Unasyn  Elevated LFTs - AST 62, T. bili 2.5 - Improving  Severe hypoalbuminemia - We will request a dietitian/nutrition consult  Hypotension with history of essential hypertension -Amlodipine, carvedilol and isosorbide mononitrate currently on hold  COPD/asthma/chronic respiratory  failure - No exacerbation noted - Continue home O2  Morbid obesity Body mass index is 44.35 kg/m.     Code Status: Full Code Consultants: none Level of Care: Level of care: Telemetry Medical Total time on patient care: 35 DVT prophylaxis:  Lovenox   Objective:   Vitals:   07/21/22 0715 07/21/22 0845 07/21/22 0852 07/21/22 0930  BP: (!) 108/52 107/65  106/63  Pulse: 89 89  92  Resp: 17 17  16   Temp:   (!) 97.4 F (36.3 C)   TempSrc:   Oral   SpO2: 96% 96%  97%  Weight:      Height:       Filed Weights   07/20/22 1303  Weight: 110 kg   Exam: General exam: Appears comfortable  HEENT: oral mucosa moist Respiratory system: Clear to auscultation.  Cardiovascular system: S1 & S2 heard  Gastrointestinal system: Abdomen soft, non-tender, nondistended. Normal bowel sounds   Extremities: No cyanosis, clubbing or edema Psychiatry:  Mood & affect appropriate.      CBC: Recent Labs  Lab 07/20/22 1318 07/20/22 1334 07/21/22 0515  WBC 11.9*  --  9.9  NEUTROABS 9.9*  --   --   HGB 15.0 16.3* 13.6  HCT 46.0 48.0* 44.0  MCV 81.7  --  85.6  PLT 280  --  258   Basic Metabolic Panel: Recent Labs  Lab 07/20/22 1318 07/20/22 1334 07/21/22 0600  NA 137 136 137  K 4.5 4.4 4.3  CL 99 102 105  CO2 20*  --  19*  GLUCOSE 132* 132* 68*  BUN CREATININE 1.51* 1.30* 1.08*  CALCIUM 9.6  --  8.4*   GFR: Estimated Creatinine Clearance: 56.7 mL/min (A) (by C-G formula based on SCr of 1.08 mg/dL (H)).  Scheduled Meds:  insulin aspart  0-5 Units Subcutaneous QHS   insulin aspart  0-9 Units Subcutaneous TID WC   Continuous Infusions:  lactated ringers 125 mL/hr at 07/21/22 0942   metronidazole 500 mg (07/21/22 0942)   Imaging and lab data was personally reviewed MR BRAIN WO CONTRAST  Result Date: 07/21/2022 CLINICAL DATA:  Stroke suspected EXAM: MRI HEAD WITHOUT CONTRAST TECHNIQUE: Multiplanar, multiecho pulse sequences of the brain and surrounding structures  were obtained without intravenous contrast. COMPARISON:  No prior MRI, correlation is made with CT head 07/20/2022 FINDINGS: Brain: No restricted diffusion to suggest acute or subacute infarct. No acute hemorrhage, mass, mass effect, or midline shift. Focus of hemosiderin deposition in the lateral left cerebellum, likely sequela of prior microhemorrhage. No hydrocephalus or extra-axial collection. Scattered T2 hyperintense signal in the periventricular white matter, likely the sequela of mild-to-moderate chronic small vessel ischemic disease. Dilated perivascular spaces in the bilateral basal ganglia. Vascular: Normal arterial flow voids. Skull and upper cervical spine: Hyperostosis frontalis. Otherwise normal marrow signal. Sinuses/Orbits: Clear paranasal sinuses. Status post bilateral lens replacements. Other: Trace fluid in left mastoid air cells. IMPRESSION: No acute intracranial process. No evidence of acute or subacute infarct. Electronically Signed   By: Wiliam Ke M.D.   On: 07/21/2022 03:16   CT ABDOMEN PELVIS W CONTRAST  Result Date: 07/20/2022 CLINICAL DATA:  Pulmonary embolism (PE) suspected, high prob; LLQ abdominal pain EXAM: CT ANGIOGRAPHY CHEST CT ABDOMEN AND PELVIS WITH CONTRAST TECHNIQUE: Multidetector CT imaging of the chest was performed using the standard protocol during bolus administration of intravenous contrast. Multiplanar CT image reconstructions and MIPs were obtained to evaluate the vascular anatomy. Multidetector CT imaging of the abdomen and pelvis was performed using the standard protocol during bolus administration of intravenous contrast. RADIATION DOSE REDUCTION: This exam was performed according to the departmental dose-optimization program which includes automated exposure control, adjustment of the mA and/or kV according to patient size and/or use of iterative reconstruction technique. CONTRAST:  65mL OMNIPAQUE IOHEXOL 350 MG/ML SOLN COMPARISON:  None Available.  FINDINGS: CTA CHEST FINDINGS Cardiovascular: Satisfactory opacification of the pulmonary arteries to the segmental level. No evidence of pulmonary embolism. normal heart size. No significant pericardial effusion. The thoracic aorta is normal in caliber. Mild atherosclerotic plaque of the thoracic aorta. At least 3 vessel coronary artery calcifications. Mediastinum/Nodes: No enlarged mediastinal, hilar, or axillary lymph nodes. Thyroid gland, trachea, and esophagus demonstrate no significant findings. Small volume hiatal hernia. Lungs/Pleura: Low lung volumes. No focal consolidation. 4 mm subpleural micronodule within the right middle lobe (7:41). No pulmonary mass. No pleural effusion. No pneumothorax. Musculoskeletal: No chest wall abnormality. No suspicious lytic or blastic osseous lesions. No acute displaced fracture. Multilevel degenerative changes of the spine. Review of the MIP images confirms the above findings. CT ABDOMEN and PELVIS FINDINGS Hepatobiliary: No focal liver abnormality. Status post cholecystectomy. Common bile duct dilatation which can be seen in the post cholecystectomy setting. Pancreas: Diffusely atrophic. No focal lesion. Otherwise normal pancreatic contour. No surrounding inflammatory changes. No main pancreatic ductal dilatation. Spleen: Normal in size without focal abnormality. Adrenals/Urinary Tract: No adrenal nodule bilaterally. Bilateral kidneys enhance symmetrically. No hydronephrosis. No hydroureter. Bilateral parapelvic fluid density lesions likely representing simple renal cysts. Simple renal cysts, in the  absence of clinically indicated signs/symptoms, require no independent follow-up. The urinary bladder is unremarkable. Stomach/Bowel: Stomach is within normal limits. No evidence of bowel wall thickening or dilatation. Second portion of the duodenum diverticula. Colonic diverticulosis. The appendix is not definitely identified with no inflammatory changes in the right lower  quadrant to suggest acute appendicitis. Vascular/Lymphatic: No abdominal aorta or iliac aneurysm. Moderate to severe atherosclerotic plaque of the aorta and its branches. No abdominal, pelvic, or inguinal lymphadenopathy. Reproductive: Status post hysterectomy. No adnexal masses. Other: Nonspecific slight fat stranding within the left abdomen/pelvis (6:47, 3:78) along the proximal sigmoid colon. No intraperitoneal free fluid. No intraperitoneal free gas. No organized fluid collection. Musculoskeletal: Diastasis rectus. Small shallow supraumbilical ventral wall hernia with a non abdominal defect of 7.5 cm containing couple short loops of small bowel as well as mesenteric fat (3:63, 7:15024). Large infraumbilical ventral hernia containing several loops of small bowel and large bowel as well as mesentery with an abdominal defect of 13 x 13 cm. No suspicious lytic or blastic osseous lesions. No acute displaced fracture. Multilevel degenerative changes of the spine. L1 chronic appearing compression fracture. Review of the MIP images confirms the above findings. IMPRESSION: 1. No pulmonary embolus. 2. Low lung volumes with no acute intrathoracic abnormality. 3. Small hiatal hernia. 4. Colonic diverticulosis. Nonspecific slight fat stranding within the left abdomen/pelvis along the proximal sigmoid colon. No associated bowel wall thickening. Finding may represent resolving or developing mild acute diverticulitis. 5. Supraumbilical and infraumbilical ventral hernias containing bowel with large abdominal defect as well as associated diastasis rectus. No findings suggest associated bowel obstruction or ischemia. Electronically Signed   By: Tish Frederickson M.D.   On: 07/20/2022 20:12   CT Angio Chest PE W and/or Wo Contrast  Result Date: 07/20/2022 CLINICAL DATA:  Pulmonary embolism (PE) suspected, high prob; LLQ abdominal pain EXAM: CT ANGIOGRAPHY CHEST CT ABDOMEN AND PELVIS WITH CONTRAST TECHNIQUE: Multidetector CT  imaging of the chest was performed using the standard protocol during bolus administration of intravenous contrast. Multiplanar CT image reconstructions and MIPs were obtained to evaluate the vascular anatomy. Multidetector CT imaging of the abdomen and pelvis was performed using the standard protocol during bolus administration of intravenous contrast. RADIATION DOSE REDUCTION: This exam was performed according to the departmental dose-optimization program which includes automated exposure control, adjustment of the mA and/or kV according to patient size and/or use of iterative reconstruction technique. CONTRAST:  65mL OMNIPAQUE IOHEXOL 350 MG/ML SOLN COMPARISON:  None Available. FINDINGS: CTA CHEST FINDINGS Cardiovascular: Satisfactory opacification of the pulmonary arteries to the segmental level. No evidence of pulmonary embolism. normal heart size. No significant pericardial effusion. The thoracic aorta is normal in caliber. Mild atherosclerotic plaque of the thoracic aorta. At least 3 vessel coronary artery calcifications. Mediastinum/Nodes: No enlarged mediastinal, hilar, or axillary lymph nodes. Thyroid gland, trachea, and esophagus demonstrate no significant findings. Small volume hiatal hernia. Lungs/Pleura: Low lung volumes. No focal consolidation. 4 mm subpleural micronodule within the right middle lobe (7:41). No pulmonary mass. No pleural effusion. No pneumothorax. Musculoskeletal: No chest wall abnormality. No suspicious lytic or blastic osseous lesions. No acute displaced fracture. Multilevel degenerative changes of the spine. Review of the MIP images confirms the above findings. CT ABDOMEN and PELVIS FINDINGS Hepatobiliary: No focal liver abnormality. Status post cholecystectomy. Common bile duct dilatation which can be seen in the post cholecystectomy setting. Pancreas: Diffusely atrophic. No focal lesion. Otherwise normal pancreatic contour. No surrounding inflammatory changes. No main pancreatic  ductal dilatation. Spleen: Normal  in size without focal abnormality. Adrenals/Urinary Tract: No adrenal nodule bilaterally. Bilateral kidneys enhance symmetrically. No hydronephrosis. No hydroureter. Bilateral parapelvic fluid density lesions likely representing simple renal cysts. Simple renal cysts, in the absence of clinically indicated signs/symptoms, require no independent follow-up. The urinary bladder is unremarkable. Stomach/Bowel: Stomach is within normal limits. No evidence of bowel wall thickening or dilatation. Second portion of the duodenum diverticula. Colonic diverticulosis. The appendix is not definitely identified with no inflammatory changes in the right lower quadrant to suggest acute appendicitis. Vascular/Lymphatic: No abdominal aorta or iliac aneurysm. Moderate to severe atherosclerotic plaque of the aorta and its branches. No abdominal, pelvic, or inguinal lymphadenopathy. Reproductive: Status post hysterectomy. No adnexal masses. Other: Nonspecific slight fat stranding within the left abdomen/pelvis (6:47, 3:78) along the proximal sigmoid colon. No intraperitoneal free fluid. No intraperitoneal free gas. No organized fluid collection. Musculoskeletal: Diastasis rectus. Small shallow supraumbilical ventral wall hernia with a non abdominal defect of 7.5 cm containing couple short loops of small bowel as well as mesenteric fat (3:63, 7:15024). Large infraumbilical ventral hernia containing several loops of small bowel and large bowel as well as mesentery with an abdominal defect of 13 x 13 cm. No suspicious lytic or blastic osseous lesions. No acute displaced fracture. Multilevel degenerative changes of the spine. L1 chronic appearing compression fracture. Review of the MIP images confirms the above findings. IMPRESSION: 1. No pulmonary embolus. 2. Low lung volumes with no acute intrathoracic abnormality. 3. Small hiatal hernia. 4. Colonic diverticulosis. Nonspecific slight fat stranding within  the left abdomen/pelvis along the proximal sigmoid colon. No associated bowel wall thickening. Finding may represent resolving or developing mild acute diverticulitis. 5. Supraumbilical and infraumbilical ventral hernias containing bowel with large abdominal defect as well as associated diastasis rectus. No findings suggest associated bowel obstruction or ischemia. Electronically Signed   By: Tish Frederickson M.D.   On: 07/20/2022 20:12   CT Lumbar Spine Wo Contrast  Result Date: 07/20/2022 CLINICAL DATA:  Found down. EXAM: CT THORACIC AND LUMBAR SPINE WITHOUT CONTRAST TECHNIQUE: Multidetector CT imaging of the thoracic and lumbar spine was performed without contrast. Multiplanar CT image reconstructions were also generated. RADIATION DOSE REDUCTION: This exam was performed according to the departmental dose-optimization program which includes automated exposure control, adjustment of the mA and/or kV according to patient size and/or use of iterative reconstruction technique. COMPARISON:  None Available. FINDINGS: CT THORACIC SPINE FINDINGS Alignment: Normal Vertebrae: No acute fracture or bone lesion. Mild compression deformity T11 appears remote. No posterior rib fractures are identified. Paraspinal and other soft tissues: No significant findings. Moderate-sized hiatal hernia. Dependent bibasilar atelectasis. Coronary artery and aortic calcifications. Disc levels: No large disc protrusions, spinal foraminal stenosis. CT LUMBAR SPINE FINDINGS Segmentation: There are five lumbar type vertebral bodies. The last full intervertebral disc space is labeled L5-S1. Alignment: Normal Vertebrae: L1 compression fracture appears remote. No acute fracture lines or paraspinal hematoma. No bone lesions. Bilateral pars defects noted at L5 with minimal anterolisthesis. Paraspinal and other soft tissues: Advanced aortic calcifications but no aneurysm. Disc levels: L1-2: No significant findings. L2-3: Advanced facet disease but  no spinal or foraminal stenosis. L3-4: Advanced facet disease and mild bulging annulus but no significant spinal or foraminal stenosis. L4-5: Severe facet disease and despite prior laminectomy there is moderately severe spinal and bilateral lateral recess stenosis. L5-S1: Bulging uncovered disc but generous spinal canal. Wide decompressive laminectomy. No spinal or foraminal stenosis. IMPRESSION: 1. No acute thoracic or lumbar spine fracture. Remote appearing T11 and  L1 fractures. 2. Postoperative changes in the lower lumbar spine with laminectomy defects at L4-5 and L5-S1. 3. 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 4. Bilateral pars defects at L5. 5. Aortic atherosclerosis. Aortic Atherosclerosis (ICD10-I70.0). Electronically Signed   By: Rudie MeyerP.  Gallerani M.D.   On: 07/20/2022 17:04   CT Thoracic Spine Wo Contrast  Result Date: 07/20/2022 CLINICAL DATA:  Found down. EXAM: CT THORACIC AND LUMBAR SPINE WITHOUT CONTRAST TECHNIQUE: Multidetector CT imaging of the thoracic and lumbar spine was performed without contrast. Multiplanar CT image reconstructions were also generated. RADIATION DOSE REDUCTION: This exam was performed according to the departmental dose-optimization program which includes automated exposure control, adjustment of the mA and/or kV according to patient size and/or use of iterative reconstruction technique. COMPARISON:  None Available. FINDINGS: CT THORACIC SPINE FINDINGS Alignment: Normal Vertebrae: No acute fracture or bone lesion. Mild compression deformity T11 appears remote. No posterior rib fractures are identified. Paraspinal and other soft tissues: No significant findings. Moderate-sized hiatal hernia. Dependent bibasilar atelectasis. Coronary artery and aortic calcifications. Disc levels: No large disc protrusions, spinal foraminal stenosis. CT LUMBAR SPINE FINDINGS Segmentation: There are five lumbar type vertebral bodies. The last full intervertebral  disc space is labeled L5-S1. Alignment: Normal Vertebrae: L1 compression fracture appears remote. No acute fracture lines or paraspinal hematoma. No bone lesions. Bilateral pars defects noted at L5 with minimal anterolisthesis. Paraspinal and other soft tissues: Advanced aortic calcifications but no aneurysm. Disc levels: L1-2: No significant findings. L2-3: Advanced facet disease but no spinal or foraminal stenosis. L3-4: Advanced facet disease and mild bulging annulus but no significant spinal or foraminal stenosis. L4-5: Severe facet disease and despite prior laminectomy there is moderately severe spinal and bilateral lateral recess stenosis. L5-S1: Bulging uncovered disc but generous spinal canal. Wide decompressive laminectomy. No spinal or foraminal stenosis. IMPRESSION: 1. No acute thoracic or lumbar spine fracture. Remote appearing T11 and L1 fractures. 2. Postoperative changes in the lower lumbar spine with laminectomy defects at L4-5 and L5-S1. 3. 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 4. Bilateral pars defects at L5. 5. Aortic atherosclerosis. Aortic Atherosclerosis (ICD10-I70.0). Electronically Signed   By: Rudie MeyerP.  Gallerani M.D.   On: 07/20/2022 17:04   CT Head Wo Contrast  Result Date: 07/20/2022 CLINICAL DATA:  71 year old female with fall. EXAM: CT HEAD WITHOUT CONTRAST CT CERVICAL SPINE WITHOUT CONTRAST TECHNIQUE: Multidetector CT imaging of the head and cervical spine was performed following the standard protocol without intravenous contrast. Multiplanar CT image reconstructions of the cervical spine were also generated. RADIATION DOSE REDUCTION: This exam was performed according to the departmental dose-optimization program which includes automated exposure control, adjustment of the mA and/or kV according to patient size and/or use of iterative reconstruction technique. COMPARISON:  None Available. FINDINGS: CT HEAD FINDINGS Brain: Age indeterminate infarct  versus chronic ischemic changes in the RIGHT basal ganglia noted. No other infarct, hemorrhage, mass lesion or mass effect, hydrocephalus or extra-axial collection noted. Mild atrophy and chronic small-vessel white matter ischemic changes noted. Vascular: Carotid and vertebral atherosclerotic calcifications are noted. Skull: Normal. Negative for fracture or focal lesion. Sinuses/Orbits: No acute finding. Other: None. CT CERVICAL SPINE FINDINGS Alignment: Normal. Skull base and vertebrae: No acute fracture. No primary bone lesion or focal pathologic process. Soft tissues and spinal canal: No prevertebral fluid or swelling. No visible canal hematoma. Disc levels: Mild multilevel facet arthropathy noted contributing to mild bony foraminal narrowing throughout the cervical spine. Disc spaces are relatively maintained.  Upper chest: No acute abnormality Other: None IMPRESSION: 1. Age indeterminate infarct versus chronic ischemic changes in the RIGHT basal ganglia. No other evidence of acute intracranial abnormality. No evidence of hemorrhage. 2. No evidence of acute injury to the cervical spine. 3. Mild atrophy and chronic small-vessel white matter ischemic changes. Electronically Signed   By: Harmon Pier M.D.   On: 07/20/2022 16:13   CT Cervical Spine Wo Contrast  Result Date: 07/20/2022 CLINICAL DATA:  71 year old female with fall. EXAM: CT HEAD WITHOUT CONTRAST CT CERVICAL SPINE WITHOUT CONTRAST TECHNIQUE: Multidetector CT imaging of the head and cervical spine was performed following the standard protocol without intravenous contrast. Multiplanar CT image reconstructions of the cervical spine were also generated. RADIATION DOSE REDUCTION: This exam was performed according to the departmental dose-optimization program which includes automated exposure control, adjustment of the mA and/or kV according to patient size and/or use of iterative reconstruction technique. COMPARISON:  None Available. FINDINGS: CT HEAD  FINDINGS Brain: Age indeterminate infarct versus chronic ischemic changes in the RIGHT basal ganglia noted. No other infarct, hemorrhage, mass lesion or mass effect, hydrocephalus or extra-axial collection noted. Mild atrophy and chronic small-vessel white matter ischemic changes noted. Vascular: Carotid and vertebral atherosclerotic calcifications are noted. Skull: Normal. Negative for fracture or focal lesion. Sinuses/Orbits: No acute finding. Other: None. CT CERVICAL SPINE FINDINGS Alignment: Normal. Skull base and vertebrae: No acute fracture. No primary bone lesion or focal pathologic process. Soft tissues and spinal canal: No prevertebral fluid or swelling. No visible canal hematoma. Disc levels: Mild multilevel facet arthropathy noted contributing to mild bony foraminal narrowing throughout the cervical spine. Disc spaces are relatively maintained. Upper chest: No acute abnormality Other: None IMPRESSION: 1. Age indeterminate infarct versus chronic ischemic changes in the RIGHT basal ganglia. No other evidence of acute intracranial abnormality. No evidence of hemorrhage. 2. No evidence of acute injury to the cervical spine. 3. Mild atrophy and chronic small-vessel white matter ischemic changes. Electronically Signed   By: Harmon Pier M.D.   On: 07/20/2022 16:13   DG Chest 2 View  Result Date: 07/20/2022 CLINICAL DATA:  Fall, left rib pain EXAM: CHEST - 2 VIEW COMPARISON:  06/17/2019 FINDINGS: Low volume portable examination. Cardiomegaly. Mild, diffuse bilateral interstitial pulmonary opacity and bronchovascular crowding. No obvious displaced rib fracture. IMPRESSION: 1. Low volume portable examination. 2. Cardiomegaly. 3. Mild, diffuse bilateral interstitial pulmonary opacity and bronchovascular crowding, likely edema. 4. No obvious displaced rib fracture or pneumothorax. CT may be helpful to further evaluate if fracture is suspected. Electronically Signed   By: Jearld Lesch M.D.   On: 07/20/2022 15:17    DG Elbow Complete Left  Result Date: 07/20/2022 CLINICAL DATA:  Pain after fall EXAM: LEFT ELBOW - COMPLETE 3+ VIEW COMPARISON:  None Available. FINDINGS: No acute fracture or dislocation in the left elbow. Mild degenerative arthritis. No elbow joint effusion. No significant soft tissue abnormality. IMPRESSION: No acute fracture or dislocation. Electronically Signed   By: Minerva Fester M.D.   On: 07/20/2022 15:16   DG Shoulder Left  Result Date: 07/20/2022 CLINICAL DATA:  Fall.  Pain. EXAM: LEFT SHOULDER - 2+ VIEW COMPARISON:  None Available. FINDINGS: There is no evidence of fracture or dislocation. There is no evidence of arthropathy or other focal bone abnormality. Soft tissues are unremarkable. IMPRESSION: Negative. Electronically Signed   By: Kennith Center M.D.   On: 07/20/2022 15:15   DG Tibia/Fibula Left  Result Date: 07/20/2022 CLINICAL DATA:  Fall.  Pain. EXAM: LEFT  TIBIA AND FIBULA - 2 VIEW COMPARISON:  None Available. FINDINGS: There is no evidence of fracture or other focal bone lesions. Soft tissues are unremarkable. IMPRESSION: Negative. Electronically Signed   By: Kennith Center M.D.   On: 07/20/2022 15:15   DG Hip Unilat W or Wo Pelvis 2-3 Views Left  Result Date: 07/20/2022 CLINICAL DATA:  Hip pain after fall EXAM: DG HIP (WITH OR WITHOUT PELVIS) 2-3V LEFT COMPARISON:  None Available. FINDINGS: No acute fracture or dislocation in the pelvis. Degenerative changes pubic symphysis, both hips, SI joints and lower lumbar spine. IMPRESSION: No acute fracture or dislocation. Electronically Signed   By: Minerva Fester M.D.   On: 07/20/2022 15:14    LOS: 1 day   Author: Calvert Cantor  07/21/2022 10:25 AM  To contact [facility]>   Check the care team in [facility] and look for the attending/consulting [facility] provider listed  Log into [email or web address] and use [place]'s universal password   Go to> "[facility]"  and find provider  If you still have difficulty reaching the  provider, please page the [facility] (Director on Call) for the Hospitalists listed on amion

## 2022-07-21 NOTE — ED Notes (Signed)
Son updated. Pt speaking with son via phone.

## 2022-07-22 ENCOUNTER — Inpatient Hospital Stay (HOSPITAL_COMMUNITY): Payer: Medicare Other

## 2022-07-22 DIAGNOSIS — T796XXA Traumatic ischemia of muscle, initial encounter: Secondary | ICD-10-CM | POA: Diagnosis not present

## 2022-07-22 DIAGNOSIS — N179 Acute kidney failure, unspecified: Secondary | ICD-10-CM | POA: Diagnosis not present

## 2022-07-22 LAB — COMPREHENSIVE METABOLIC PANEL
ALT: 16 U/L (ref 0–44)
AST: 39 U/L (ref 15–41)
Albumin: 1.9 g/dL — ABNORMAL LOW (ref 3.5–5.0)
Alkaline Phosphatase: 55 U/L (ref 38–126)
Anion gap: 8 (ref 5–15)
BUN: 11 mg/dL (ref 8–23)
CO2: 23 mmol/L (ref 22–32)
Calcium: 8.5 mg/dL — ABNORMAL LOW (ref 8.9–10.3)
Chloride: 107 mmol/L (ref 98–111)
Creatinine, Ser: 1.01 mg/dL — ABNORMAL HIGH (ref 0.44–1.00)
GFR, Estimated: 60 mL/min — ABNORMAL LOW (ref >60)
Glucose, Bld: 110 mg/dL — ABNORMAL HIGH (ref 70–99)
Potassium: 3.8 mmol/L (ref 3.5–5.1)
Sodium: 138 mmol/L (ref 135–145)
Total Bilirubin: 1.6 mg/dL — ABNORMAL HIGH (ref 0.3–1.2)
Total Protein: 5.1 g/dL — ABNORMAL LOW (ref 6.5–8.1)

## 2022-07-22 LAB — CBC
HCT: 36.7 % (ref 36.0–46.0)
Hemoglobin: 11.7 g/dL — ABNORMAL LOW (ref 12.0–15.0)
MCH: 26.5 pg (ref 26.0–34.0)
MCHC: 31.9 g/dL (ref 30.0–36.0)
MCV: 83.2 fL (ref 80.0–100.0)
Platelets: 262 10*3/uL (ref 150–400)
RBC: 4.41 MIL/uL (ref 3.87–5.11)
RDW: 17.2 % — ABNORMAL HIGH (ref 11.5–15.5)
WBC: 6.1 10*3/uL (ref 4.0–10.5)
nRBC: 0 % (ref 0.0–0.2)

## 2022-07-22 LAB — GLUCOSE, CAPILLARY
Glucose-Capillary: 82 mg/dL (ref 70–99)
Glucose-Capillary: 110 mg/dL — ABNORMAL HIGH (ref 70–99)
Glucose-Capillary: 87 mg/dL (ref 70–99)
Glucose-Capillary: 146 mg/dL — ABNORMAL HIGH (ref 70–99)

## 2022-07-22 MED ORDER — ENOXAPARIN SODIUM 60 MG/0.6ML IJ SOSY: 0.5000 mg/kg | PREFILLED_SYRINGE | INTRAMUSCULAR | Status: DC

## 2022-07-22 MED ORDER — BANATROL TF EN LIQD
60.0000 mL | Freq: Two times a day (BID) | ENTERAL | Status: DC
  Administered 2022-07-22 (×2): 60 mL via ORAL
  Filled 2022-07-22 (×2): qty 60

## 2022-07-22 MED ORDER — RIVAROXABAN 20 MG PO TABS
20.0000 mg | ORAL_TABLET | Freq: Every day | ORAL | Status: DC
  Administered 2022-07-22 – 2022-07-24 (×3): 20 mg via ORAL
  Filled 2022-07-22 (×3): qty 1

## 2022-07-22 MED ORDER — ONDANSETRON HCL 4 MG/2ML IJ SOLN: 4.0000 mg | Freq: Four times a day (QID) | INTRAMUSCULAR | Status: DC | PRN

## 2022-07-22 MED ORDER — GLUCERNA SHAKE PO LIQD
237.0000 mL | Freq: Two times a day (BID) | ORAL | Status: DC
  Administered 2022-07-22 – 2022-07-25 (×6): 237 mL via ORAL

## 2022-07-22 MED ORDER — GADOBUTROL 1 MMOL/ML IV SOLN
10.0000 mL | Freq: Once | INTRAVENOUS | Status: AC | PRN
  Administered 2022-07-22: 10 mL via INTRAVENOUS

## 2022-07-22 MED ORDER — BANATROL TF EN LIQD: 60.0000 mL | Freq: Two times a day (BID) | ENTERAL | Status: DC

## 2022-07-22 MED ORDER — BENEPROTEIN PO POWD
1.0000 | Freq: Three times a day (TID) | ORAL | Status: DC
  Administered 2022-07-22 – 2022-07-24 (×2): 6 g via ORAL
  Filled 2022-07-22: qty 227

## 2022-07-22 NOTE — Progress Notes (Signed)
Initial Nutrition Assessment  DOCUMENTATION CODES:   Morbid obesity  INTERVENTION:  - Add Glucerna Shake po BID, each supplement provides 220 kcal and 10 grams of protein   - Add Banatrol BID.   NUTRITION DIAGNOSIS:   Inadequate oral intake related to poor appetite as evidenced by meal completion < 50%.  GOAL:   Patient will meet greater than or equal to 90% of their needs  MONITOR:   PO intake, Supplement acceptance  REASON FOR ASSESSMENT:   Consult Assessment of nutrition requirement/status  ASSESSMENT:   70 y.o. female admits related to fall. PMH includes: asthma/COPD, chronic hypoxemic respiratory failure, T2DM, CAD, HLD, HTN, recurrent DVTs, CKD stage 3, GERD. Pt is currently receiving medical management related to rhabdomyolysis.  Meds include:  sliding scale insulin. Labs reviewed: creatinine elevated.   The pt reports that she has not had much of an appetite for about 3 months now. Pt has experienced some weight loss over the past 3 months but not enough to qualify for malnutrition. Pt agreed to try Glucerna BID. Pt also states that she has been having some issues with diarrhea. RD will add Banatrol BID for now. Will continue to monitor PO intakes.   NUTRITION - FOCUSED PHYSICAL EXAM:  WNL.   Diet Order:   Diet Order             Diet regular Room service appropriate? Yes; Fluid consistency: Thin  Diet effective now                   EDUCATION NEEDS:   Education needs have been addressed  Skin:  Skin Assessment: Reviewed RN Assessment  Last BM:  07/21/22  Height:   Ht Readings from Last 1 Encounters:  07/20/22 5\' 2"  (1.575 m)    Weight:   Wt Readings from Last 1 Encounters:  07/20/22 110 kg    Ideal Body Weight:  50 kg  BMI:  Body mass index is 44.35 kg/m.  Estimated Nutritional Needs:   Kcal:  1500-1750 kcals  Protein:  75-90 gm  Fluid:  >/= 1.5 L  07/22/22, RD, LDN, CNSC.

## 2022-07-22 NOTE — Progress Notes (Addendum)
Received patient from ED, alert and oriented, but forgetful, abdomen is distended and having a pain. With redness in bilateral groin, under the breast and axilla, powder applied. Unable to pee since last as per pt, bladder scan 276, NP on call notified. Straight cath done and able to removed 500 ml. As per NP Fayrene Fearing, straight cath if more than 300 ml.   0600H, bladder scan is 256 ml only but she is having discomfort, straight cath done with 500 ml output.

## 2022-07-22 NOTE — Progress Notes (Signed)
OT Cancellation Note  Patient Details Name: Veola Cafaro MRN: [id number] DOB: 1951-06-15   Cancelled Treatment:    Reason Eval/Treat Not Completed: Patient at procedure or test/ unavailable (checked on pt x 2, pt in testing) Will continue to follow.   Evern Bio 07/22/2022, 11:37 AM Berna Spare, OTR/L Acute Rehabilitation Services Office: [phone]

## 2022-07-22 NOTE — Progress Notes (Signed)
PT Cancellation Note  Patient Details Name: Mary Ferguson MRN: [id number] DOB: 02/07/1951   Cancelled Treatment:    Reason Eval/Treat Not Completed: Patient at procedure or test/unavailable. Checked on pt twice this morning, out of the room both times. Will check back as schedule allows to initiate PT evaluation.    [name] 07/22/2022, 10:48 AM  Conni Slipper, PT, DPT Acute Rehabilitation Services Secure Chat Preferred Office: [phone]

## 2022-07-22 NOTE — Progress Notes (Signed)
[facility] Progress Note  Patient: Mary Ferguson     ZHY:[id number]  DOA: 07/20/2022   PCP: Natalia Leatherwood, DO       Brief hospital course: This is a 71 year old female with COPD/asthma and chronic hypoxemic respiratory failure who is on 3 L of oxygen at baseline, diabetes mellitus type 2, coronary artery disease, hypertension, recurrent DVTs, class III obesity and CKD stage III who presents to the hospital after a fall.  The patient fell 2 to 3 days prior to presenting to the hospital and laid on the floor.  Friend went to check on her and she was found to be without her oxygen on.  Pulse ox was 64% and the patient was tachycardic tachypneic and hypothermic.  Work-up revealed a WBC count of 11.9, metabolic acidosis with bicarb 20 and anion gap 18, mildly elevated LFTs and a CK of 1651.  CT head, C-spine, T-spine and L-spine were performed and revealed: - Age indeterminant infarct versus chronic ischemic changes in the right basal ganglia - Laminectomy at L4-L5 and L5-S1 - Remote T11 and L1 fractures  No acute fractures were noted.  The patient was given IV fluid boluses and then started on continuous IV fluids.  Subjective:  She has pain in her back today. No other complaints.  Assessment and Plan: Principal Problem:   Rhabdomyolysis -2ndary to laying on the floor - CK improving  Active Problems: Mild AKI and metabolic acidosis with severe dehydration - Creatinine 1.51 - Has improved to 1.01 - Bicarb 19 has improved to 23 - cont IVF until oral intake improves  Possible acute diverticulitis - CT revealed stranding proximal sigmoid colon - no acute increase in pain -  on Flagyl- has allergy to flouroquinolones and Cephalosporins - changed to Unasyn - continue antibiotics x 5 days  Elevated LFTs - AST 62, T. bili 2.5 - Improving  Severe hypoalbuminemia - We will request a dietitian/nutrition consult - have asked her to increase her protein intake  Hypotension  with history of essential hypertension -Amlodipine, carvedilol and isosorbide mononitrate currently on hold  COPD/asthma/chronic respiratory failure - No exacerbation noted - Continue home O2  Morbid obesity Body mass index is 44.35 kg/m.     Code Status: Full Code Consultants: none Level of Care: Level of care: Telemetry Medical Total time on patient care: 35 DVT prophylaxis:   rivaroxaban (XARELTO) tablet 20 mg    Objective:   Vitals:   07/21/22 1926 07/21/22 2140 07/22/22 0455 07/22/22 0750  BP: 126/68 119/63 137/72 135/67  Pulse: 78 87 89 96  Resp: Temp: 98 F (36.7 C)  98.4 F (36.9 C) 98.3 F (36.8 C)  TempSrc: Oral  Oral Oral  SpO2: 100% 100% 97% 99%  Weight:      Height:       Filed Weights   07/20/22 1303  Weight: 110 kg   Exam: General exam: Appears comfortable  HEENT: oral mucosa moist Respiratory system: Clear to auscultation.  Cardiovascular system: S1 & S2 heard  Gastrointestinal system: Abdomen soft, non-tender, nondistended. Normal bowel sounds   Extremities: No cyanosis, clubbing or edema Psychiatry:  Mood & affect appropriate.      CBC: Recent Labs  Lab 07/20/22 1318 07/20/22 1334 07/21/22 0515 07/22/22 0252  WBC 11.9*  --  9.9 6.1  NEUTROABS 9.9*  --   --   --   HGB 15.0 16.3* 13.6 11.7*  HCT 46.0 48.0* 44.0 36.7  MCV 81.7  --  85.6 83.2  PLT 280  --  258 262    Basic Metabolic Panel: Recent Labs  Lab 07/20/22 1318 07/20/22 1334 07/21/22 0600 07/22/22 0252  NA 137 136 137 138  K 4.5 4.4 4.3 3.8  CL 99 102 105 107  CO2 20*  --  19* 23  GLUCOSE 132* 132* 68* 110*  BUN 12 15 12 11   CREATININE 1.51* 1.30* 1.08* 1.01*  CALCIUM 9.6  --  8.4* 8.5*    GFR: Estimated Creatinine Clearance: 60.6 mL/min (A) (by C-G formula based on SCr of 1.01 mg/dL (H)).  Scheduled Meds:  acetaminophen  650 mg Oral QID   insulin aspart  0-5 Units Subcutaneous QHS   insulin aspart  0-9 Units Subcutaneous TID WC   protein  supplement  1 Scoop Oral TID WC   rivaroxaban  20 mg Oral Q supper   Continuous Infusions:  sodium chloride 125 mL/hr at 07/22/22 0558   ampicillin-sulbactam (UNASYN) IV 3 g (07/22/22 0352)   Imaging and lab data was personally reviewed MR THORACIC SPINE W WO CONTRAST  Result Date: 07/22/2022 CLINICAL DATA:  Chronic mid to low back pain. EXAM: MRI THORACIC AND LUMBAR SPINE WITHOUT AND WITH CONTRAST TECHNIQUE: Multiplanar and multiecho pulse sequences of the thoracic and lumbar spine were obtained without and with intravenous contrast. CONTRAST:  10mL GADAVIST GADOBUTROL 1 MMOL/ML IV SOLN COMPARISON:  CTs of the chest, abdomen and pelvis and thoracic and lumbar spine 07/20/2022. FINDINGS: MRI THORACIC SPINE FINDINGS Alignment: Mildly exaggerated thoracic kyphosis. No focal angulation or listhesis. Vertebrae: No evidence of acute fracture or traumatic subluxation. Minimally heterogeneous marrow signal without suspicious lesion on inversion recovery imaging. No abnormal osseous enhancement. Cord: The thoracic cord appears normal in signal and caliber.No abnormal intradural enhancement. Paraspinal and other soft tissues: No significant paraspinal findings. Trace bilateral pleural effusions. Disc levels: No evidence of discitis or osteomyelitis. Mild multilevel spondylosis with paraspinal osteophytes, partial ankylosing at T9-10. Scattered small disc protrusions, most notable at T6-7 and T7-8. Multilevel facet hypertrophy. No cord deformity or large disc herniation identified. Facet hypertrophy contributes to mild foraminal narrowing at multiple levels. MRI LUMBAR SPINE FINDINGS Segmentation: Conventional anatomy assumed, with the last open disc space designated L5-S1.Concordant with prior imaging. Alignment: Stable minimal anterolisthesis at L5-S1 secondary to chronic bilateral L5 pars defects. Vertebrae: Chronic superior endplate compression deformity at L1 as seen on recent CT. No associated marrow edema or  abnormal enhancement. No acute osseous findings. Chronic bilateral L5 pars defects. Mild sacroiliac degenerative changes bilaterally. Conus medullaris: Extends to the L1-2 level and appears normal. No abnormal intradural enhancement. Paraspinal and other soft tissues: No significant paraspinal findings. Bilateral renal sinus cysts, unchanged from recent CT; no follow-up imaging recommended. Disc levels: T12-L1: Mild disc bulging and facet hypertrophy. No spinal stenosis or nerve root encroachment. L1-2: Disc height and hydration are maintained. No spinal stenosis or nerve root encroachment. L2-3: Preserved disc height. Mild disc bulging and facet hypertrophy. No spinal stenosis or nerve root encroachment. L3-4: Preserved disc height. Mild disc bulging and bilateral facet hypertrophy. No significant spinal stenosis or nerve root encroachment. L4-5: Mild loss of disc height with mild annular disc bulging eccentric to the left. Moderate bilateral facet hypertrophy. Mild spinal stenosis with mild lateral recess and foraminal narrowing bilaterally. 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 Mild disc uncovering and bulging contributing to mild foraminal narrowing bilaterally. Evidence of previous bilateral laminectomies. IMPRESSION: 1. No acute findings or explanation for the patient's  symptoms. 2. Chronic superior endplate compression deformity at L1. No acute osseous findings. 3. Mild thoracic spondylosis without cord deformity or high-grade spinal stenosis. 4. 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 5. Mild disc bulging and facet hypertrophy at L4-5 contributing to mild spinal stenosis with mild lateral recess and foraminal narrowing bilaterally. Electronically Signed   By: Carey Bullocks M.D.   On: 07/22/2022 11:44   MR Lumbar Spine W Wo Contrast  Result Date: 07/22/2022 CLINICAL DATA:   Chronic mid to low back pain. EXAM: MRI THORACIC AND LUMBAR SPINE WITHOUT AND WITH CONTRAST TECHNIQUE: Multiplanar and multiecho pulse sequences of the thoracic and lumbar spine were obtained without and with intravenous contrast. CONTRAST:  11mL GADAVIST GADOBUTROL 1 MMOL/ML IV SOLN COMPARISON:  CTs of the chest, abdomen and pelvis and thoracic and lumbar spine 07/20/2022. FINDINGS: MRI THORACIC SPINE FINDINGS Alignment: Mildly exaggerated thoracic kyphosis. No focal angulation or listhesis. Vertebrae: No evidence of acute fracture or traumatic subluxation. Minimally heterogeneous marrow signal without suspicious lesion on inversion recovery imaging. No abnormal osseous enhancement. Cord: The thoracic cord appears normal in signal and caliber.No abnormal intradural enhancement. Paraspinal and other soft tissues: No significant paraspinal findings. Trace bilateral pleural effusions. Disc levels: No evidence of discitis or osteomyelitis. Mild multilevel spondylosis with paraspinal osteophytes, partial ankylosing at T9-10. Scattered small disc protrusions, most notable at T6-7 and T7-8. Multilevel facet hypertrophy. No cord deformity or large disc herniation identified. Facet hypertrophy contributes to mild foraminal narrowing at multiple levels. MRI LUMBAR SPINE FINDINGS Segmentation: Conventional anatomy assumed, with the last open disc space designated L5-S1.Concordant with prior imaging. Alignment: Stable minimal anterolisthesis at L5-S1 secondary to chronic bilateral L5 pars defects. Vertebrae: Chronic superior endplate compression deformity at L1 as seen on recent CT. No associated marrow edema or abnormal enhancement. No acute osseous findings. Chronic bilateral L5 pars defects. Mild sacroiliac degenerative changes bilaterally. Conus medullaris: Extends to the L1-2 level and appears normal. No abnormal intradural enhancement. Paraspinal and other soft tissues: No significant paraspinal findings. Bilateral renal  sinus cysts, unchanged from recent CT; no follow-up imaging recommended. Disc levels: T12-L1: Mild disc bulging and facet hypertrophy. No spinal stenosis or nerve root encroachment. L1-2: Disc height and hydration are maintained. No spinal stenosis or nerve root encroachment. L2-3: Preserved disc height. Mild disc bulging and facet hypertrophy. No spinal stenosis or nerve root encroachment. L3-4: Preserved disc height. Mild disc bulging and bilateral facet hypertrophy. No significant spinal stenosis or nerve root encroachment. L4-5: Mild loss of disc height with mild annular disc bulging eccentric to the left. Moderate bilateral facet hypertrophy. Mild spinal stenosis with mild lateral recess and foraminal narrowing bilaterally. 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 Mild disc uncovering and bulging contributing to mild foraminal narrowing bilaterally. Evidence of previous bilateral laminectomies. IMPRESSION: 1. No acute findings or explanation for the patient's symptoms. 2. Chronic superior endplate compression deformity at L1. No acute osseous findings. 3. Mild thoracic spondylosis without cord deformity or high-grade spinal stenosis. 4. 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 5. Mild disc bulging and facet hypertrophy at L4-5 contributing to mild spinal stenosis with mild lateral recess and foraminal narrowing bilaterally. Electronically Signed   By: Carey Bullocks M.D.   On: 07/22/2022 11:44   MR BRAIN WO CONTRAST  Result Date: 07/21/2022 CLINICAL DATA:  Stroke suspected EXAM: MRI HEAD WITHOUT CONTRAST  TECHNIQUE: Multiplanar, multiecho pulse sequences of the brain and surrounding structures were obtained without intravenous contrast. COMPARISON:  No prior MRI, correlation is made with CT head 07/20/2022 FINDINGS: Brain: No restricted diffusion to suggest acute or subacute infarct.  No acute hemorrhage, mass, mass effect, or midline shift. Focus of hemosiderin deposition in the lateral left cerebellum, likely sequela of prior microhemorrhage. No hydrocephalus or extra-axial collection. Scattered T2 hyperintense signal in the periventricular white matter, likely the sequela of mild-to-moderate chronic small vessel ischemic disease. Dilated perivascular spaces in the bilateral basal ganglia. Vascular: Normal arterial flow voids. Skull and upper cervical spine: Hyperostosis frontalis. Otherwise normal marrow signal. Sinuses/Orbits: Clear paranasal sinuses. Status post bilateral lens replacements. Other: Trace fluid in left mastoid air cells. IMPRESSION: No acute intracranial process. No evidence of acute or subacute infarct. Electronically Signed   By: Wiliam Ke M.D.   On: 07/21/2022 03:16   CT ABDOMEN PELVIS W CONTRAST  Result Date: 07/20/2022 CLINICAL DATA:  Pulmonary embolism (PE) suspected, high prob; LLQ abdominal pain EXAM: CT ANGIOGRAPHY CHEST CT ABDOMEN AND PELVIS WITH CONTRAST TECHNIQUE: Multidetector CT imaging of the chest was performed using the standard protocol during bolus administration of intravenous contrast. Multiplanar CT image reconstructions and MIPs were obtained to evaluate the vascular anatomy. Multidetector CT imaging of the abdomen and pelvis was performed using the standard protocol during bolus administration of intravenous contrast. RADIATION DOSE REDUCTION: This exam was performed according to the departmental dose-optimization program which includes automated exposure control, adjustment of the mA and/or kV according to patient size and/or use of iterative reconstruction technique. CONTRAST:  26mL OMNIPAQUE IOHEXOL 350 MG/ML SOLN COMPARISON:  None Available. FINDINGS: CTA CHEST FINDINGS Cardiovascular: Satisfactory opacification of the pulmonary arteries to the segmental level. No evidence of pulmonary embolism. normal heart size. No significant pericardial  effusion. The thoracic aorta is normal in caliber. Mild atherosclerotic plaque of the thoracic aorta. At least 3 vessel coronary artery calcifications. Mediastinum/Nodes: No enlarged mediastinal, hilar, or axillary lymph nodes. Thyroid gland, trachea, and esophagus demonstrate no significant findings. Small volume hiatal hernia. Lungs/Pleura: Low lung volumes. No focal consolidation. 4 mm subpleural micronodule within the right middle lobe (7:41). No pulmonary mass. No pleural effusion. No pneumothorax. Musculoskeletal: No chest wall abnormality. No suspicious lytic or blastic osseous lesions. No acute displaced fracture. Multilevel degenerative changes of the spine. Review of the MIP images confirms the above findings. CT ABDOMEN and PELVIS FINDINGS Hepatobiliary: No focal liver abnormality. Status post cholecystectomy. Common bile duct dilatation which can be seen in the post cholecystectomy setting. Pancreas: Diffusely atrophic. No focal lesion. Otherwise normal pancreatic contour. No surrounding inflammatory changes. No main pancreatic ductal dilatation. Spleen: Normal in size without focal abnormality. Adrenals/Urinary Tract: No adrenal nodule bilaterally. Bilateral kidneys enhance symmetrically. No hydronephrosis. No hydroureter. Bilateral parapelvic fluid density lesions likely representing simple renal cysts. Simple renal cysts, in the absence of clinically indicated signs/symptoms, require no independent follow-up. The urinary bladder is unremarkable. Stomach/Bowel: Stomach is within normal limits. No evidence of bowel wall thickening or dilatation. Second portion of the duodenum diverticula. Colonic diverticulosis. The appendix is not definitely identified with no inflammatory changes in the right lower quadrant to suggest acute appendicitis. Vascular/Lymphatic: No abdominal aorta or iliac aneurysm. Moderate to severe atherosclerotic plaque of the aorta and its branches. No abdominal, pelvic, or inguinal  lymphadenopathy. Reproductive: Status post hysterectomy. No adnexal masses. Other: Nonspecific slight fat stranding within the left abdomen/pelvis (6:47, 3:78) along the proximal sigmoid colon. No intraperitoneal free fluid.  No intraperitoneal free gas. No organized fluid collection. Musculoskeletal: Diastasis rectus. Small shallow supraumbilical ventral wall hernia with a non abdominal defect of 7.5 cm containing couple short loops of small bowel as well as mesenteric fat (3:63, 7:15024). Large infraumbilical ventral hernia containing several loops of small bowel and large bowel as well as mesentery with an abdominal defect of 13 x 13 cm. No suspicious lytic or blastic osseous lesions. No acute displaced fracture. Multilevel degenerative changes of the spine. L1 chronic appearing compression fracture. Review of the MIP images confirms the above findings. IMPRESSION: 1. No pulmonary embolus. 2. Low lung volumes with no acute intrathoracic abnormality. 3. Small hiatal hernia. 4. Colonic diverticulosis. Nonspecific slight fat stranding within the left abdomen/pelvis along the proximal sigmoid colon. No associated bowel wall thickening. Finding may represent resolving or developing mild acute diverticulitis. 5. Supraumbilical and infraumbilical ventral hernias containing bowel with large abdominal defect as well as associated diastasis rectus. No findings suggest associated bowel obstruction or ischemia. Electronically Signed   By: Tish Frederickson M.D.   On: 07/20/2022 20:12   CT Angio Chest PE W and/or Wo Contrast  Result Date: 07/20/2022 CLINICAL DATA:  Pulmonary embolism (PE) suspected, high prob; LLQ abdominal pain EXAM: CT ANGIOGRAPHY CHEST CT ABDOMEN AND PELVIS WITH CONTRAST TECHNIQUE: Multidetector CT imaging of the chest was performed using the standard protocol during bolus administration of intravenous contrast. Multiplanar CT image reconstructions and MIPs were obtained to evaluate the vascular anatomy.  Multidetector CT imaging of the abdomen and pelvis was performed using the standard protocol during bolus administration of intravenous contrast. RADIATION DOSE REDUCTION: This exam was performed according to the departmental dose-optimization program which includes automated exposure control, adjustment of the mA and/or kV according to patient size and/or use of iterative reconstruction technique. CONTRAST:  19mL OMNIPAQUE IOHEXOL 350 MG/ML SOLN COMPARISON:  None Available. FINDINGS: CTA CHEST FINDINGS Cardiovascular: Satisfactory opacification of the pulmonary arteries to the segmental level. No evidence of pulmonary embolism. normal heart size. No significant pericardial effusion. The thoracic aorta is normal in caliber. Mild atherosclerotic plaque of the thoracic aorta. At least 3 vessel coronary artery calcifications. Mediastinum/Nodes: No enlarged mediastinal, hilar, or axillary lymph nodes. Thyroid gland, trachea, and esophagus demonstrate no significant findings. Small volume hiatal hernia. Lungs/Pleura: Low lung volumes. No focal consolidation. 4 mm subpleural micronodule within the right middle lobe (7:41). No pulmonary mass. No pleural effusion. No pneumothorax. Musculoskeletal: No chest wall abnormality. No suspicious lytic or blastic osseous lesions. No acute displaced fracture. Multilevel degenerative changes of the spine. Review of the MIP images confirms the above findings. CT ABDOMEN and PELVIS FINDINGS Hepatobiliary: No focal liver abnormality. Status post cholecystectomy. Common bile duct dilatation which can be seen in the post cholecystectomy setting. Pancreas: Diffusely atrophic. No focal lesion. Otherwise normal pancreatic contour. No surrounding inflammatory changes. No main pancreatic ductal dilatation. Spleen: Normal in size without focal abnormality. Adrenals/Urinary Tract: No adrenal nodule bilaterally. Bilateral kidneys enhance symmetrically. No hydronephrosis. No hydroureter. Bilateral  parapelvic fluid density lesions likely representing simple renal cysts. Simple renal cysts, in the absence of clinically indicated signs/symptoms, require no independent follow-up. The urinary bladder is unremarkable. Stomach/Bowel: Stomach is within normal limits. No evidence of bowel wall thickening or dilatation. Second portion of the duodenum diverticula. Colonic diverticulosis. The appendix is not definitely identified with no inflammatory changes in the right lower quadrant to suggest acute appendicitis. Vascular/Lymphatic: No abdominal aorta or iliac aneurysm. Moderate to severe atherosclerotic plaque of the aorta and its branches.  No abdominal, pelvic, or inguinal lymphadenopathy. Reproductive: Status post hysterectomy. No adnexal masses. Other: Nonspecific slight fat stranding within the left abdomen/pelvis (6:47, 3:78) along the proximal sigmoid colon. No intraperitoneal free fluid. No intraperitoneal free gas. No organized fluid collection. Musculoskeletal: Diastasis rectus. Small shallow supraumbilical ventral wall hernia with a non abdominal defect of 7.5 cm containing couple short loops of small bowel as well as mesenteric fat (3:63, 7:15024). Large infraumbilical ventral hernia containing several loops of small bowel and large bowel as well as mesentery with an abdominal defect of 13 x 13 cm. No suspicious lytic or blastic osseous lesions. No acute displaced fracture. Multilevel degenerative changes of the spine. L1 chronic appearing compression fracture. Review of the MIP images confirms the above findings. IMPRESSION: 1. No pulmonary embolus. 2. Low lung volumes with no acute intrathoracic abnormality. 3. Small hiatal hernia. 4. Colonic diverticulosis. Nonspecific slight fat stranding within the left abdomen/pelvis along the proximal sigmoid colon. No associated bowel wall thickening. Finding may represent resolving or developing mild acute diverticulitis. 5. Supraumbilical and infraumbilical  ventral hernias containing bowel with large abdominal defect as well as associated diastasis rectus. No findings suggest associated bowel obstruction or ischemia. Electronically Signed   By: Tish Frederickson M.D.   On: 07/20/2022 20:12   CT Lumbar Spine Wo Contrast  Result Date: 07/20/2022 CLINICAL DATA:  Found down. EXAM: CT THORACIC AND LUMBAR SPINE WITHOUT CONTRAST TECHNIQUE: Multidetector CT imaging of the thoracic and lumbar spine was performed without contrast. Multiplanar CT image reconstructions were also generated. RADIATION DOSE REDUCTION: This exam was performed according to the departmental dose-optimization program which includes automated exposure control, adjustment of the mA and/or kV according to patient size and/or use of iterative reconstruction technique. COMPARISON:  None Available. FINDINGS: CT THORACIC SPINE FINDINGS Alignment: Normal Vertebrae: No acute fracture or bone lesion. Mild compression deformity T11 appears remote. No posterior rib fractures are identified. Paraspinal and other soft tissues: No significant findings. Moderate-sized hiatal hernia. Dependent bibasilar atelectasis. Coronary artery and aortic calcifications. Disc levels: No large disc protrusions, spinal foraminal stenosis. CT LUMBAR SPINE FINDINGS Segmentation: There are five lumbar type vertebral bodies. The last full intervertebral disc space is labeled L5-S1. Alignment: Normal Vertebrae: L1 compression fracture appears remote. No acute fracture lines or paraspinal hematoma. No bone lesions. Bilateral pars defects noted at L5 with minimal anterolisthesis. Paraspinal and other soft tissues: Advanced aortic calcifications but no aneurysm. Disc levels: L1-2: No significant findings. L2-3: Advanced facet disease but no spinal or foraminal stenosis. L3-4: Advanced facet disease and mild bulging annulus but no significant spinal or foraminal stenosis. L4-5: Severe facet disease and despite prior laminectomy there is  moderately severe spinal and bilateral lateral recess stenosis. L5-S1: Bulging uncovered disc but generous spinal canal. Wide decompressive laminectomy. No spinal or foraminal stenosis. IMPRESSION: 1. No acute thoracic or lumbar spine fracture. Remote appearing T11 and L1 fractures. 2. Postoperative changes in the lower lumbar spine with laminectomy defects at L4-5 and L5-S1. 3. 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 4. Bilateral pars defects at L5. 5. Aortic atherosclerosis. Aortic Atherosclerosis (ICD10-I70.0). Electronically Signed   By: Rudie Meyer M.D.   On: 07/20/2022 17:04   CT Thoracic Spine Wo Contrast  Result Date: 07/20/2022 CLINICAL DATA:  Found down. EXAM: CT THORACIC AND LUMBAR SPINE WITHOUT CONTRAST TECHNIQUE: Multidetector CT imaging of the thoracic and lumbar spine was performed without contrast. Multiplanar CT image reconstructions were also generated. RADIATION DOSE REDUCTION: This exam was performed according  to the departmental dose-optimization program which includes automated exposure control, adjustment of the mA and/or kV according to patient size and/or use of iterative reconstruction technique. COMPARISON:  None Available. FINDINGS: CT THORACIC SPINE FINDINGS Alignment: Normal Vertebrae: No acute fracture or bone lesion. Mild compression deformity T11 appears remote. No posterior rib fractures are identified. Paraspinal and other soft tissues: No significant findings. Moderate-sized hiatal hernia. Dependent bibasilar atelectasis. Coronary artery and aortic calcifications. Disc levels: No large disc protrusions, spinal foraminal stenosis. CT LUMBAR SPINE FINDINGS Segmentation: There are five lumbar type vertebral bodies. The last full intervertebral disc space is labeled L5-S1. Alignment: Normal Vertebrae: L1 compression fracture appears remote. No acute fracture lines or paraspinal hematoma. No bone lesions. Bilateral pars defects noted at L5 with  minimal anterolisthesis. Paraspinal and other soft tissues: Advanced aortic calcifications but no aneurysm. Disc levels: L1-2: No significant findings. L2-3: Advanced facet disease but no spinal or foraminal stenosis. L3-4: Advanced facet disease and mild bulging annulus but no significant spinal or foraminal stenosis. L4-5: Severe facet disease and despite prior laminectomy there is moderately severe spinal and bilateral lateral recess stenosis. L5-S1: Bulging uncovered disc but generous spinal canal. Wide decompressive laminectomy. No spinal or foraminal stenosis. IMPRESSION: 1. No acute thoracic or lumbar spine fracture. Remote appearing T11 and L1 fractures. 2. Postoperative changes in the lower lumbar spine with laminectomy defects at L4-5 and L5-S1. 3. 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 4. Bilateral pars defects at L5. 5. Aortic atherosclerosis. Aortic Atherosclerosis (ICD10-I70.0). Electronically Signed   By: Rudie Meyer M.D.   On: 07/20/2022 17:04   CT Head Wo Contrast  Result Date: 07/20/2022 CLINICAL DATA:  71 year old female with fall. EXAM: CT HEAD WITHOUT CONTRAST CT CERVICAL SPINE WITHOUT CONTRAST TECHNIQUE: Multidetector CT imaging of the head and cervical spine was performed following the standard protocol without intravenous contrast. Multiplanar CT image reconstructions of the cervical spine were also generated. RADIATION DOSE REDUCTION: This exam was performed according to the departmental dose-optimization program which includes automated exposure control, adjustment of the mA and/or kV according to patient size and/or use of iterative reconstruction technique. COMPARISON:  None Available. FINDINGS: CT HEAD FINDINGS Brain: Age indeterminate infarct versus chronic ischemic changes in the RIGHT basal ganglia noted. No other infarct, hemorrhage, mass lesion or mass effect, hydrocephalus or extra-axial collection noted. Mild atrophy and chronic small-vessel  white matter ischemic changes noted. Vascular: Carotid and vertebral atherosclerotic calcifications are noted. Skull: Normal. Negative for fracture or focal lesion. Sinuses/Orbits: No acute finding. Other: None. CT CERVICAL SPINE FINDINGS Alignment: Normal. Skull base and vertebrae: No acute fracture. No primary bone lesion or focal pathologic process. Soft tissues and spinal canal: No prevertebral fluid or swelling. No visible canal hematoma. Disc levels: Mild multilevel facet arthropathy noted contributing to mild bony foraminal narrowing throughout the cervical spine. Disc spaces are relatively maintained. Upper chest: No acute abnormality Other: None IMPRESSION: 1. Age indeterminate infarct versus chronic ischemic changes in the RIGHT basal ganglia. No other evidence of acute intracranial abnormality. No evidence of hemorrhage. 2. No evidence of acute injury to the cervical spine. 3. Mild atrophy and chronic small-vessel white matter ischemic changes. Electronically Signed   By: Harmon Pier M.D.   On: 07/20/2022 16:13   CT Cervical Spine Wo Contrast  Result Date: 07/20/2022 CLINICAL DATA:  71 year old female with fall. EXAM: CT HEAD WITHOUT CONTRAST CT CERVICAL SPINE WITHOUT CONTRAST TECHNIQUE: Multidetector CT imaging of the head and cervical spine was performed following  the standard protocol without intravenous contrast. Multiplanar CT image reconstructions of the cervical spine were also generated. RADIATION DOSE REDUCTION: This exam was performed according to the departmental dose-optimization program which includes automated exposure control, adjustment of the mA and/or kV according to patient size and/or use of iterative reconstruction technique. COMPARISON:  None Available. FINDINGS: CT HEAD FINDINGS Brain: Age indeterminate infarct versus chronic ischemic changes in the RIGHT basal ganglia noted. No other infarct, hemorrhage, mass lesion or mass effect, hydrocephalus or extra-axial collection  noted. Mild atrophy and chronic small-vessel white matter ischemic changes noted. Vascular: Carotid and vertebral atherosclerotic calcifications are noted. Skull: Normal. Negative for fracture or focal lesion. Sinuses/Orbits: No acute finding. Other: None. CT CERVICAL SPINE FINDINGS Alignment: Normal. Skull base and vertebrae: No acute fracture. No primary bone lesion or focal pathologic process. Soft tissues and spinal canal: No prevertebral fluid or swelling. No visible canal hematoma. Disc levels: Mild multilevel facet arthropathy noted contributing to mild bony foraminal narrowing throughout the cervical spine. Disc spaces are relatively maintained. Upper chest: No acute abnormality Other: None IMPRESSION: 1. Age indeterminate infarct versus chronic ischemic changes in the RIGHT basal ganglia. No other evidence of acute intracranial abnormality. No evidence of hemorrhage. 2. No evidence of acute injury to the cervical spine. 3. Mild atrophy and chronic small-vessel white matter ischemic changes. Electronically Signed   By: Harmon Pier M.D.   On: 07/20/2022 16:13   DG Chest 2 View  Result Date: 07/20/2022 CLINICAL DATA:  Fall, left rib pain EXAM: CHEST - 2 VIEW COMPARISON:  06/17/2019 FINDINGS: Low volume portable examination. Cardiomegaly. Mild, diffuse bilateral interstitial pulmonary opacity and bronchovascular crowding. No obvious displaced rib fracture. IMPRESSION: 1. Low volume portable examination. 2. Cardiomegaly. 3. Mild, diffuse bilateral interstitial pulmonary opacity and bronchovascular crowding, likely edema. 4. No obvious displaced rib fracture or pneumothorax. CT may be helpful to further evaluate if fracture is suspected. Electronically Signed   By: Jearld Lesch M.D.   On: 07/20/2022 15:17   DG Elbow Complete Left  Result Date: 07/20/2022 CLINICAL DATA:  Pain after fall EXAM: LEFT ELBOW - COMPLETE 3+ VIEW COMPARISON:  None Available. FINDINGS: No acute fracture or dislocation in the  left elbow. Mild degenerative arthritis. No elbow joint effusion. No significant soft tissue abnormality. IMPRESSION: No acute fracture or dislocation. Electronically Signed   By: Minerva Fester M.D.   On: 07/20/2022 15:16   DG Shoulder Left  Result Date: 07/20/2022 CLINICAL DATA:  Fall.  Pain. EXAM: LEFT SHOULDER - 2+ VIEW COMPARISON:  None Available. FINDINGS: There is no evidence of fracture or dislocation. There is no evidence of arthropathy or other focal bone abnormality. Soft tissues are unremarkable. IMPRESSION: Negative. Electronically Signed   By: Kennith Center M.D.   On: 07/20/2022 15:15   DG Tibia/Fibula Left  Result Date: 07/20/2022 CLINICAL DATA:  Fall.  Pain. EXAM: LEFT TIBIA AND FIBULA - 2 VIEW COMPARISON:  None Available. FINDINGS: There is no evidence of fracture or other focal bone lesions. Soft tissues are unremarkable. IMPRESSION: Negative. Electronically Signed   By: Kennith Center M.D.   On: 07/20/2022 15:15   DG Hip Unilat W or Wo Pelvis 2-3 Views Left  Result Date: 07/20/2022 CLINICAL DATA:  Hip pain after fall EXAM: DG HIP (WITH OR WITHOUT PELVIS) 2-3V LEFT COMPARISON:  None Available. FINDINGS: No acute fracture or dislocation in the pelvis. Degenerative changes pubic symphysis, both hips, SI joints and lower lumbar spine. IMPRESSION: No acute fracture or dislocation. Electronically Signed  By: Minerva Fester M.D.   On: 07/20/2022 15:14    LOS: 2 days   Author: Ladell Heads Raenell Mensing  07/22/2022 12:38 PM  To contact [facility]>   Check the care team in [facility] and look for the attending/consulting [facility] provider listed  Log into [email or web address] and use [place] universal password   Go to> "[facility]"  and find provider  If you still have difficulty reaching the provider, please page the [facility] (Director on Call) for the Hospitalists listed on amion

## 2022-07-23 DIAGNOSIS — T796XXA Traumatic ischemia of muscle, initial encounter: Secondary | ICD-10-CM | POA: Diagnosis not present

## 2022-07-23 DIAGNOSIS — N179 Acute kidney failure, unspecified: Secondary | ICD-10-CM | POA: Diagnosis not present

## 2022-07-23 LAB — HEMOGLOBIN A1C
Hgb A1c MFr Bld: 7.2 % — ABNORMAL HIGH (ref 4.8–5.6)
Mean Plasma Glucose: 160 mg/dL

## 2022-07-23 LAB — BASIC METABOLIC PANEL
Anion gap: 11 (ref 5–15)
BUN: 6 mg/dL — ABNORMAL LOW (ref 8–23)
CO2: 22 mmol/L (ref 22–32)
Calcium: 8.9 mg/dL (ref 8.9–10.3)
Chloride: 106 mmol/L (ref 98–111)
Creatinine, Ser: 0.89 mg/dL (ref 0.44–1.00)
GFR, Estimated: >60 mL/min (ref >60)
Glucose, Bld: 122 mg/dL — ABNORMAL HIGH (ref 70–99)
Potassium: 3.4 mmol/L — ABNORMAL LOW (ref 3.5–5.1)
Sodium: 139 mmol/L (ref 135–145)

## 2022-07-23 LAB — HEPATIC FUNCTION PANEL
ALT: 21 U/L (ref 0–44)
AST: 36 U/L (ref 15–41)
Albumin: 2.5 g/dL — ABNORMAL LOW (ref 3.5–5.0)
Alkaline Phosphatase: 69 U/L (ref 38–126)
Bilirubin, Direct: <0.1 mg/dL (ref 0.0–0.2)
Total Bilirubin: 0.5 mg/dL (ref 0.3–1.2)
Total Protein: 6.3 g/dL — ABNORMAL LOW (ref 6.5–8.1)

## 2022-07-23 LAB — GLUCOSE, CAPILLARY
Glucose-Capillary: 144 mg/dL — ABNORMAL HIGH (ref 70–99)
Glucose-Capillary: 148 mg/dL — ABNORMAL HIGH (ref 70–99)
Glucose-Capillary: 156 mg/dL — ABNORMAL HIGH (ref 70–99)
Glucose-Capillary: 123 mg/dL — ABNORMAL HIGH (ref 70–99)

## 2022-07-23 LAB — MAGNESIUM: Magnesium: 1.5 mg/dL — ABNORMAL LOW (ref 1.7–2.4)

## 2022-07-23 MED ORDER — AMITRIPTYLINE HCL 50 MG PO TABS
50.0000 mg | ORAL_TABLET | Freq: Every day | ORAL | Status: DC
  Administered 2022-07-23 – 2022-07-24 (×2): 50 mg via ORAL
  Filled 2022-07-23 (×2): qty 1

## 2022-07-23 MED ORDER — DIPHENOXYLATE-ATROPINE 2.5-0.025 MG PO TABS
2.0000 | ORAL_TABLET | Freq: Once | ORAL | Status: AC
  Administered 2022-07-23: 2 via ORAL
  Filled 2022-07-23: qty 2

## 2022-07-23 MED ORDER — DIPHENOXYLATE-ATROPINE 2.5-0.025 MG PO TABS
1.0000 | ORAL_TABLET | Freq: Four times a day (QID) | ORAL | Status: DC | PRN
  Administered 2022-07-25: 1 via ORAL
  Filled 2022-07-23: qty 1

## 2022-07-23 MED ORDER — AMLODIPINE BESYLATE 2.5 MG PO TABS
2.5000 mg | ORAL_TABLET | Freq: Every day | ORAL | Status: DC
  Administered 2022-07-23 – 2022-07-25 (×3): 2.5 mg via ORAL
  Filled 2022-07-23 (×3): qty 1

## 2022-07-23 MED ORDER — POTASSIUM CHLORIDE CRYS ER 20 MEQ PO TBCR
40.0000 meq | EXTENDED_RELEASE_TABLET | ORAL | Status: AC
  Administered 2022-07-23 (×2): 40 meq via ORAL
  Filled 2022-07-23 (×2): qty 2

## 2022-07-23 NOTE — Progress Notes (Signed)
Mobility Specialist Progress Note   07/23/22 1536  Mobility  Activity Ambulated with assistance in room  Level of Assistance Standby assist, set-up cues, supervision of patient - no hands on  Assistive Device Front wheel walker  Distance Ambulated (ft) 54 ft  Activity Response Tolerated well  $Mobility charge 1 Mobility   Pre Mobility: 107 HR, 93% SpO2 on RA During Mobility: 151 HR, 89% SpO2 on RA Post Mobility: 112 HR, 98% SpO2 on RA  Received pt sitting EOB on RA w/ no complaints and agreeable. During in room ambulation, pt reporting SOB and fatigue requiring x2 standing rest breaks. Despite symptoms, SpO2 ranged from 89% - 98% while occasionally floating at ~94%. Returned back to EOB w/o fault and pt gradually recovering w/ PLB. Left w/ call bell in reach and needs met.      Holland Falling Mobility Specialist Please contact via SecureChat or  Rehab office at [phone]

## 2022-07-23 NOTE — Progress Notes (Signed)
Mobility Specialist Progress Note  Nurse requested Mobility Specialist to perform oxygen saturation test with pt which includes removing pt from oxygen both at rest and while ambulating.  Below are the results from that testing.     Patient Saturations on Room Air at Rest = SpO2 94%  Patient Saturations on Room Air while Ambulating = SpO2 90% .  Rested and performed pursed lip breathing for 1 minute with SpO2 at 96%.  At end of testing pt left in room on RA.  Reported results to nurse.   Frederico Hamman Mobility Specialist Please contact via SecureChat or  Rehab office at [phone]

## 2022-07-23 NOTE — Progress Notes (Signed)
[facility] Progress Note  Patient: Mary Ferguson     KDX:[id number]  DOA: 07/20/2022   PCP: Natalia Leatherwood, DO       Brief hospital course: This is a 71 year old female with COPD/asthma and chronic hypoxemic respiratory failure who is prescribed 3 L of oxygen at baseline, diabetes mellitus type 2, coronary artery disease, hypertension, recurrent DVTs, class III obesity and CKD stage III who presents to the hospital after a fall.  The patient fell 2 to 3 days prior to presenting to the hospital and laid on the floor.  Friend went to check on her and she was found to be without her oxygen on.  Pulse ox was 64% and the patient was tachycardic tachypneic and hypothermic.  Work-up revealed a WBC count of 11.9, metabolic acidosis with bicarb 20 and anion gap 18, mildly elevated LFTs and a CK of 1651.  CT head, C-spine, T-spine and L-spine were performed and revealed: - Age indeterminant infarct versus chronic ischemic changes in the right basal ganglia - Laminectomy at L4-L5 and L5-S1 - Remote T11 and L1 fractures  No acute fractures were noted.  The patient was given IV fluid boluses and then started on continuous IV fluids.  Subjective:  She has ongoing back pain. We had an extensive discussion about the MRI findings, need for PT and need for increased protein intake.   Assessment and Plan: Principal Problem:   Rhabdomyolysis after a fall -2ndary to laying on the floor - CK improving - waiting on PT eval  Active Problems: Mild AKI and metabolic acidosis with severe dehydration - Creatinine 1.51 - Has improved to 0.89 with IVF- will dc IVF today - Bicarb 19 has improved to normal  Back pain- difficulty ambulating - has been told in the past that she has nerve root compression - T and L spine MRI performed yesterday reveal L1 compression fracture and other degenerative changes but no nerve root impingement - on exam, her pain is in her flans and in her sacrum bilaterally but  more on the left - cont APAP for pain  - awaiting a PT eval   Possible acute diverticulitis- chronic loose stools - CT revealed stranding proximal sigmoid colon -  started on Flagyl when admitted - has allergy to flouroquinolones and Cephalosporins - started on Unasyn on 11/26> continue antibiotics x 5 days - she has loose stools and diarrhea since her gallbladder was removed and takes Lomotil PRN- have resumed this  Elevated LFTs - AST 62, T. bili 2.5 - Improving  Severe hypoalbuminemia - loss of appetite  - have requested a dietitian/nutrition consult - have asked her to increase her protein intake as she states she avoids eating meat -  we have discussed the need to have more protein in her diet and less processed carbohydrates - she will need scripts for dietary supplements when discharged  Hypokalemia - due to diarrhea - replace and follow  Hypotension with history of essential hypertension -Amlodipine, carvedilol and isosorbide mononitrate have been on hold due to hypotension related to hypovolemia - can resume Amlodipine today  COPD/asthma  - No exacerbation noted - pulse ox in 60s when EMS arrive - in 90s now- has been weaned off of O2 - will obtain an ambulatory pulse ox today when she ambulates with PT - home med rec states she no longer takes Symbicort  H/o recurrent DVTs - cont Xarelto  DM 2 - A1c in 8/23 was 7.1-   - she has trouble getting  to her PCP due to poor mobility so I will order an A1c to be checked while she is here - she is not on medications as outpt - on SSI here- CBGs have been < 150  Morbid obesity Body mass index is 44.35 kg/m.     Code Status: Full Code Consultants: none Level of Care: Level of care: Telemetry Medical Total time on patient care: 35 DVT prophylaxis:   rivaroxaban (XARELTO) tablet 20 mg    Objective:   Vitals:   07/22/22 2009 07/22/22 2023 07/23/22 0447 07/23/22 0919  BP: (!) 116/49 (!) 116/49 (!) 149/79 (!) 130/94   Pulse: 88 99 98 (!) 109  Resp: 16 16  18   Temp: 97.7 F (36.5 C) 97.7 F (36.5 C) 97.8 F (36.6 C) 98.4 F (36.9 C)  TempSrc: Oral Oral Oral Oral  SpO2: 93%  98% 97%  Weight:      Height:       Filed Weights   07/20/22 1303  Weight: 110 kg   Exam: General exam: Appears comfortable  HEENT: oral mucosa moist Respiratory system: Clear to auscultation.  Cardiovascular system: S1 & S2 heard  Gastrointestinal system: Abdomen soft, non-tender, nondistended. Normal bowel sounds   Extremities: No cyanosis, clubbing or edema Psychiatry:  Mood & affect appropriate.      CBC: Recent Labs  Lab 07/20/22 1318 07/20/22 1334 07/21/22 0515 07/22/22 0252  WBC 11.9*  --  9.9 6.1  NEUTROABS 9.9*  --   --   --   HGB 15.0 16.3* 13.6 11.7*  HCT 46.0 48.0* 44.0 36.7  MCV 81.7  --  85.6 83.2  PLT 280  --  258 262    Basic Metabolic Panel: Recent Labs  Lab 07/20/22 1318 07/20/22 1334 07/21/22 0600 07/22/22 0252 07/23/22 0834  NA 137 136 137 138 139  K 4.5 4.4 4.3 3.8 3.4*  CL 99 102 105 107 106  CO2 20*  --  19* 23 22  GLUCOSE 132* 132* 68* 110* 122*  BUN 12 15 12 11  6*  CREATININE 1.51* 1.30* 1.08* 1.01* 0.89  CALCIUM 9.6  --  8.4* 8.5* 8.9    GFR: Estimated Creatinine Clearance: 68.8 mL/min (by C-G formula based on SCr of 0.89 mg/dL).  Scheduled Meds:  acetaminophen  650 mg Oral QID   feeding supplement (GLUCERNA SHAKE)  237 mL Oral BID BM   insulin aspart  0-5 Units Subcutaneous QHS   insulin aspart  0-9 Units Subcutaneous TID WC   protein supplement  1 Scoop Oral TID WC   rivaroxaban  20 mg Oral Q supper   Continuous Infusions:  sodium chloride 125 mL/hr at 07/23/22 0149   ampicillin-sulbactam (UNASYN) IV 3 g (07/23/22 0443)   Imaging and lab data was personally reviewed MR THORACIC SPINE W WO CONTRAST  Result Date: 07/22/2022 CLINICAL DATA:  Chronic mid to low back pain. EXAM: MRI THORACIC AND LUMBAR SPINE WITHOUT AND WITH CONTRAST TECHNIQUE: Multiplanar and  multiecho pulse sequences of the thoracic and lumbar spine were obtained without and with intravenous contrast. CONTRAST:  10mL GADAVIST GADOBUTROL 1 MMOL/ML IV SOLN COMPARISON:  CTs of the chest, abdomen and pelvis and thoracic and lumbar spine 07/20/2022. FINDINGS: MRI THORACIC SPINE FINDINGS Alignment: Mildly exaggerated thoracic kyphosis. No focal angulation or listhesis. Vertebrae: No evidence of acute fracture or traumatic subluxation. Minimally heterogeneous marrow signal without suspicious lesion on inversion recovery imaging. No abnormal osseous enhancement. Cord: The thoracic cord appears normal in signal and caliber.No abnormal intradural  enhancement. Paraspinal and other soft tissues: No significant paraspinal findings. Trace bilateral pleural effusions. Disc levels: No evidence of discitis or osteomyelitis. Mild multilevel spondylosis with paraspinal osteophytes, partial ankylosing at T9-10. Scattered small disc protrusions, most notable at T6-7 and T7-8. Multilevel facet hypertrophy. No cord deformity or large disc herniation identified. Facet hypertrophy contributes to mild foraminal narrowing at multiple levels. MRI LUMBAR SPINE FINDINGS Segmentation: Conventional anatomy assumed, with the last open disc space designated L5-S1.Concordant with prior imaging. Alignment: Stable minimal anterolisthesis at L5-S1 secondary to chronic bilateral L5 pars defects. Vertebrae: Chronic superior endplate compression deformity at L1 as seen on recent CT. No associated marrow edema or abnormal enhancement. No acute osseous findings. Chronic bilateral L5 pars defects. Mild sacroiliac degenerative changes bilaterally. Conus medullaris: Extends to the L1-2 level and appears normal. No abnormal intradural enhancement. Paraspinal and other soft tissues: No significant paraspinal findings. Bilateral renal sinus cysts, unchanged from recent CT; no follow-up imaging recommended. Disc levels: T12-L1: Mild disc bulging and  facet hypertrophy. No spinal stenosis or nerve root encroachment. L1-2: Disc height and hydration are maintained. No spinal stenosis or nerve root encroachment. L2-3: Preserved disc height. Mild disc bulging and facet hypertrophy. No spinal stenosis or nerve root encroachment. L3-4: Preserved disc height. Mild disc bulging and bilateral facet hypertrophy. No significant spinal stenosis or nerve root encroachment. L4-5: Mild loss of disc height with mild annular disc bulging eccentric to the left. Moderate bilateral facet hypertrophy. Mild spinal stenosis with mild lateral recess and foraminal narrowing bilaterally. 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 Mild disc uncovering and bulging contributing to mild foraminal narrowing bilaterally. Evidence of previous bilateral laminectomies. IMPRESSION: 1. No acute findings or explanation for the patient's symptoms. 2. Chronic superior endplate compression deformity at L1. No acute osseous findings. 3. Mild thoracic spondylosis without cord deformity or high-grade spinal stenosis. 4. 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 5. Mild disc bulging and facet hypertrophy at L4-5 contributing to mild spinal stenosis with mild lateral recess and foraminal narrowing bilaterally. Electronically Signed   By: Carey Bullocks M.D.   On: 07/22/2022 11:44   MR Lumbar Spine W Wo Contrast  Result Date: 07/22/2022 CLINICAL DATA:  Chronic mid to low back pain. EXAM: MRI THORACIC AND LUMBAR SPINE WITHOUT AND WITH CONTRAST TECHNIQUE: Multiplanar and multiecho pulse sequences of the thoracic and lumbar spine were obtained without and with intravenous contrast. CONTRAST:  32mL GADAVIST GADOBUTROL 1 MMOL/ML IV SOLN COMPARISON:  CTs of the chest, abdomen and pelvis and thoracic and lumbar spine 07/20/2022. FINDINGS: MRI THORACIC SPINE FINDINGS Alignment: Mildly  exaggerated thoracic kyphosis. No focal angulation or listhesis. Vertebrae: No evidence of acute fracture or traumatic subluxation. Minimally heterogeneous marrow signal without suspicious lesion on inversion recovery imaging. No abnormal osseous enhancement. Cord: The thoracic cord appears normal in signal and caliber.No abnormal intradural enhancement. Paraspinal and other soft tissues: No significant paraspinal findings. Trace bilateral pleural effusions. Disc levels: No evidence of discitis or osteomyelitis. Mild multilevel spondylosis with paraspinal osteophytes, partial ankylosing at T9-10. Scattered small disc protrusions, most notable at T6-7 and T7-8. Multilevel facet hypertrophy. No cord deformity or large disc herniation identified. Facet hypertrophy contributes to mild foraminal narrowing at multiple levels. MRI LUMBAR SPINE FINDINGS Segmentation: Conventional anatomy assumed, with the last open disc space designated L5-S1.Concordant with prior imaging. Alignment: Stable minimal anterolisthesis at L5-S1 secondary to chronic bilateral L5 pars defects. Vertebrae: Chronic superior  endplate compression deformity at L1 as seen on recent CT. No associated marrow edema or abnormal enhancement. No acute osseous findings. Chronic bilateral L5 pars defects. Mild sacroiliac degenerative changes bilaterally. Conus medullaris: Extends to the L1-2 level and appears normal. No abnormal intradural enhancement. Paraspinal and other soft tissues: No significant paraspinal findings. Bilateral renal sinus cysts, unchanged from recent CT; no follow-up imaging recommended. Disc levels: T12-L1: Mild disc bulging and facet hypertrophy. No spinal stenosis or nerve root encroachment. L1-2: Disc height and hydration are maintained. No spinal stenosis or nerve root encroachment. L2-3: Preserved disc height. Mild disc bulging and facet hypertrophy. No spinal stenosis or nerve root encroachment. L3-4: Preserved disc height. Mild disc  bulging and bilateral facet hypertrophy. No significant spinal stenosis or nerve root encroachment. L4-5: Mild loss of disc height with mild annular disc bulging eccentric to the left. Moderate bilateral facet hypertrophy. Mild spinal stenosis with mild lateral recess and foraminal narrowing bilaterally. 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 Mild disc uncovering and bulging contributing to mild foraminal narrowing bilaterally. Evidence of previous bilateral laminectomies. IMPRESSION: 1. No acute findings or explanation for the patient's symptoms. 2. Chronic superior endplate compression deformity at L1. No acute osseous findings. 3. Mild thoracic spondylosis without cord deformity or high-grade spinal stenosis. 4. 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 5. Mild disc bulging and facet hypertrophy at L4-5 contributing to mild spinal stenosis with mild lateral recess and foraminal narrowing bilaterally. Electronically Signed   By: Carey Bullocks M.D.   On: 07/22/2022 11:44    LOS: 3 days   Author: Ladell Heads Shatona Andujar  07/23/2022 10:06 AM  To contact [facility]>   Check the care team in [facility] and look for the attending/consulting [facility] provider listed  Log into [email or web address] and use [place]'s universal password   Go to> "[facility]"  and find provider  If you still have difficulty reaching the provider, please page the [facility] (Director on Call) for the Hospitalists listed on amion

## 2022-07-23 NOTE — TOC Initial Note (Addendum)
Transition of Care (TOC) - Initial/Assessment Note   Patient from home with son.   Discussed PT recommendations for OP PT. In MD note patient has trouble getting to PCP office due to weakness.  Patient states she has transportation, her son can take her to OP PT twice a week and it won't be a problem . She would prefer to go to [facility] just off of [address], [address] [phone]. Will need MD to sign referral form. Referral form signed and faxed   Patient does have home oxygen , but states concentrator is not working and tanks empty. Digital message on concentrator reads " on over 3000 hours". Patient unsure of her oxygen agency name, use to be in [place] but sold out. Her doctor who ordered her oxygen use to be in [place] but she cannot remember his name. However, she now sees a  pulmonary doctor is now at [facility] . Belenda Cruise messaged NCM : She does have oxygen with [facility] , however she is in asset recovery for delinquent payments as well as she is up for recertification. They cannot service her until they have new saturation note and order that qualify . Patient and son aware . [employer] called son he will call [employer] back with credit card information.   Nurse aware new ambulation oxygen saturation note needed.   Oxygen saturation ambulation note done. Patient does not qualify for home oxygen . Notified Kristin with [facility].   Belenda Cruise with [facility] aware will arrange a service call on her concentrator , but if it is broken to the point that she needs a new one , they will need order and qualifying sats. [employer] will call Gerilyn Pilgrim to arrange service call.    Patient aware   Gerilyn Pilgrim has scheduled service call for today   NCM called [facility] . Patient sees Dr Wynona Neat and her oxygen is with [facility]. NCM called Belenda Cruise with [facility]. Belenda Cruise will check to confirm patient has oxygen with them and call NCM back.  NCM made patient aware.  Patient Details   Name: Mary Ferguson MRN: [id number] Date of Birth: 11/17/1950  Transition of Care [facility]) CM/SW Contact:    Kingsley Plan, RN Phone Number: 07/23/2022, 1:00 PM  Clinical Narrative:                   Expected Discharge Plan: Home/Self Care Barriers to Discharge: Continued Medical Work up   Patient Goals and CMS Choice Patient states their goals for this hospitalization and ongoing recovery are:: to return to home      Expected Discharge Plan and Services Expected Discharge Plan: Home/Self Care   Discharge Planning Services: CM Consult Post Acute Care Choice:  (OP PT) Living arrangements for the past 2 months: Single Family Home                 DME Arranged:  (see note) DME Agency: [facility] Date DME Agency Contacted: 07/23/22 Time DME Agency Contacted: 1103 Representative spoke with at DME Agency: Belenda Cruise HH Arranged: NA          Prior Living Arrangements/Services Living arrangements for the past 2 months: Single Family Home Lives with:: Adult Children Patient language and need for interpreter reviewed:: Yes Do you feel safe going back to the place where you live?: Yes      Need for Family Participation in Patient Care: Yes (Comment) Care giver support system in place?: Yes (comment) Current home  services: DME Criminal Activity/Legal Involvement Pertinent to Current Situation/Hospitalization: No - Comment as needed  Activities of Daily Living Home Assistive Devices/Equipment: Oxygen, Walker (specify type) ADL Screening (condition at time of admission) Patient's cognitive ability adequate to safely complete daily activities?: Yes Is the patient deaf or have difficulty hearing?: Yes Does the patient have difficulty seeing, even when wearing glasses/contacts?: No Does the patient have difficulty concentrating, remembering, or making decisions?: No Patient able to express need for assistance with ADLs?: Yes Does the patient have difficulty dressing or bathing?:  Yes Independently performs ADLs?: No Communication: Independent Dressing (OT): Needs assistance Is this a change from baseline?: Pre-admission baseline Grooming: Needs assistance Is this a change from baseline?: Pre-admission baseline Feeding: Independent Bathing: Needs assistance Is this a change from baseline?: Pre-admission baseline Toileting: Independent In/Out Bed: Independent Walks in Home: Independent with device (comment) Does the patient have difficulty walking or climbing stairs?: Yes Weakness of Legs: Both Weakness of Arms/Hands: Both  Permission Sought/Granted   Permission granted to share information with : No              Emotional Assessment Appearance:: Appears stated age Attitude/Demeanor/Rapport: Engaged Affect (typically observed): Accepting Orientation: : Oriented to Self, Oriented to Place, Oriented to  Time, Oriented to Situation Alcohol / Substance Use: Not Applicable Psych Involvement: No (comment)  Admission diagnosis:  Fall, initial encounter [W19.XXXA] Acute diverticulitis [K57.92] Non-traumatic rhabdomyolysis [M62.82] Patient Active Problem List   Diagnosis Date Noted   Rhabdomyolysis 07/21/2022   Acute kidney injury superimposed on chronic kidney disease (HCC) 07/21/2022   Fall at home, initial encounter 07/21/2022   High anion gap metabolic acidosis 07/21/2022   Elevated liver enzymes 07/21/2022   Acute diverticulitis 07/20/2022   Chronic venous insufficiency 01/08/2022   Irritable bowel syndrome with diarrhea 06/21/2021   Imdur therapy-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) 03/22/2021   Compression fracture of L1 lumbar vertebra (HCC) 03/22/2021   Lumbar radiculopathy 03/22/2021   Neuropathy 03/22/2021   Paresthesia of bilateral legs 03/22/2021   Aortic atherosclerosis (HCC) 03/22/2021   Type II diabetes mellitus with manifestations (HCC) 03/21/2021   COPD (chronic obstructive pulmonary disease) (HCC) 03/21/2021   CAD  S/P PCI 06/30/2019   Dyslipidemia, goal LDL below 70 06/30/2019   Morbid obesity (HCC)    Essential hypertension    History of non-ST elevation myocardial infarction (NSTEMI) 06/17/2019   Acute on chronic respiratory failure with hypoxemia (HCC) 01/24/2018   Mild episode of recurrent major depressive disorder (HCC) 03/25/2017   CKD (chronic kidney disease) stage 3, GFR 30-59 ml/min (HCC) 03/19/2017   Vitamin D deficiency 03/19/2017   B12 deficiency 03/19/2017   Nocturnal oxygen desaturation 12/11/2016   BMI 45.0-49.9, adult (HCC) 12/11/2016   Ambulates with cane 12/11/2016   Chronic pruritic rash in adult 07/08/2016   Chronic pain syndrome 03/13/2015   Abdominal wall hernia 01/27/2014   Obstructive sleep apnea (adult) (pediatric) 08/05/2013   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HCC) 02/18/2013   Moderate persistent asthma 12/30/2012   History of recurrent deep vein thrombosis (DVT) 12/30/2012   PCP:  Natalia Leatherwood, DO Pharmacy:   [id number] - [place], [place] - [address] [place] [address] Phone: [phone] Fax: [phone]     Social Determinants of Health (SDOH) Interventions    Readmission Risk Interventions     No data to display

## 2022-07-23 NOTE — Evaluation (Signed)
Occupational Therapy Evaluation Patient Details Name: Mary Ferguson MRN: [id number] DOB: June 04, 1951 Today's Date: 07/23/2022   History of Present Illness This is a 71 year old female with COPD/asthma and chronic hypoxemic respiratory failure who is prescribed 3 L of oxygen at baseline, diabetes mellitus type 2, coronary artery disease, hypertension, recurrent DVTs, class III obesity and CKD stage III who presents to the hospital after a fall.  Head CT with Age indeterminant infarct versus chronic ischemic changes in the right basal ganglia.  Spinal imaging: Laminectomy at L4-L5 and L5-S1, Remote T11 and L1 fractures.   Clinical Impression   Pt  at PLOF reported they have been limited to mostly home level distant but has had about 1 fall per week with use of walker. Pt at this time needs min guard to min assist LB ADLS and set up for UE ADLS. Pt was educated about energy conservation methods and positioning with AE to decrease in SOB with ADLs. Pt currently with functional limitations due to the deficits listed below (see OT Problem List). Pt will benefit from skilled OT to increase their safety and independence with ADL and functional mobility for ADL to facilitate discharge to venue listed below.        Recommendations for follow up therapy are one component of a multi-disciplinary discharge planning process, led by the attending physician.  Recommendations may be updated based on patient status, additional functional criteria and insurance authorization.   Follow Up Recommendations  Other (comment) (OP PT)     Assistance Recommended at Discharge Intermittent Supervision/Assistance  Patient can return home with the following A little help with bathing/dressing/bathroom;Assistance with cooking/housework;Assist for transportation    Functional Status Assessment  Patient has had a recent decline in their functional status and demonstrates the ability to make significant improvements in function  in a reasonable and predictable amount of time.  Equipment Recommendations  None recommended by OT    Recommendations for Other Services       Precautions / Restrictions Precautions Precautions: Fall Precaution Comments: about 6 falls in 6 months Restrictions Weight Bearing Restrictions: No      Mobility Bed Mobility Overal bed mobility: Modified Independent             General bed mobility comments: Pt uses B socks to assist with sitting to supine due to BLE edema    Transfers Overall transfer level: Modified independent Equipment used: Rolling walker (2 wheels)                      Balance Overall balance assessment: Needs assistance Sitting-balance support: Feet supported Sitting balance-Leahy Scale: Good     Standing balance support: Bilateral upper extremity supported, During functional activity Standing balance-Leahy Scale: Poor Standing balance comment: due to pain in BLE limited WB                           ADL either performed or assessed with clinical judgement   ADL Overall ADL's : Needs assistance/impaired Eating/Feeding: Independent;Sitting   Grooming: Wash/dry hands;Wash/dry face;Set up;Sitting   Upper Body Bathing: Set up;Sitting   Lower Body Bathing: Minimal assistance;Sitting/lateral leans;Sit to/from stand   Upper Body Dressing : Set up;Sitting   Lower Body Dressing: Minimal assistance;Sit to/from stand;Sitting/lateral leans   Toilet Transfer: Min guard;BSC/3in1;Rolling walker (2 wheels)   Toileting- Architect and Hygiene: Min guard;Sit to/from stand       Functional mobility during ADLs: Min guard;Rolling walker (2 wheels);Cueing  for sequencing;Cueing for safety       Vision         Perception     Praxis      Pertinent Vitals/Pain Pain Assessment Pain Assessment: 0-10 Pain Score: 6  Breathing: normal Negative Vocalization: none Facial Expression: smiling or inexpressive Body Language:  relaxed Consolability: no need to console PAINAD Score: 0 Pain Location: BLE Pain Descriptors / Indicators: Aching Pain Intervention(s): Monitored during session     Hand Dominance Right   Extremity/Trunk Assessment Upper Extremity Assessment Upper Extremity Assessment: Overall WFL for tasks assessed   Lower Extremity Assessment Lower Extremity Assessment: Defer to PT evaluation   Cervical / Trunk Assessment Cervical / Trunk Assessment: Kyphotic;Lordotic;Other exceptions Cervical / Trunk Exceptions: forward head, increased body habitus   Communication Communication Communication: No difficulties   Cognition Arousal/Alertness: Awake/alert Behavior During Therapy: WFL for tasks assessed/performed Overall Cognitive Status: Within Functional Limits for tasks assessed                                       General Comments  HR 108-128, o2 on RA 96-98%    Exercises     Shoulder Instructions      Home Living Family/patient expects to be discharged to:: Private residence Living Arrangements: Children Available Help at Discharge: Family Type of Home: House Home Access: [place]: One level     Bathroom Shower/Tub: Tub/shower unit   [place]: Tub bench;Cane - single point;Rollator (4 wheels);Rolling Walker (2 wheels);Wheelchair - manual          Prior Functioning/Environment Prior Level of Function : Needs assist             Mobility Comments: reports has not driven since had Covid in 2020; walks about 10' then has pain in back, feet and legs ADLs Comments: waits to shower till someone is home; son and daughter in law work and run around a lot        OT Problem List: Decreased strength;Decreased activity tolerance;Impaired balance (sitting and/or standing);Decreased knowledge of use of DME or AE;Decreased safety awareness;Cardiopulmonary status limiting activity;Pain       OT Treatment/Interventions: Therapeutic exercise;Self-care/ADL training;Therapeutic activities;Patient/family education;Balance training;DME and/or AE instruction;Energy conservation    OT Goals(Current goals can be found in the care plan section) Acute Rehab OT Goals Patient Stated Goal: to get my strength back OT Goal Formulation: With patient Time For Goal Achievement: 08/06/22 Potential to Achieve Goals: Good ADL Goals Pt Will Perform Grooming: Independently;standing Pt Will Perform Lower Body Bathing: with modified independence;with adaptive equipment;sit to/from stand Pt Will Perform Lower Body Dressing: with modified independence;sit to/from stand;with adaptive equipment Pt Will Transfer to Toilet: with modified independence;ambulating Additional ADL Goal #1: Pt will be able to complete ADLS for about 5 mins in standing with no SOB or LOB  OT Frequency: Min 2X/week    Co-evaluation              AM-PAC OT "6 Clicks" Daily Activity     Outcome Measure Help from another person eating meals?: None Help from another person taking care of personal grooming?: None Help from another person toileting, which includes using toliet, bedpan, or urinal?: A Little Help from another person bathing (including washing, rinsing, drying)?: A Little Help from another person to put on and taking off regular upper body  clothing?: None Help from another person to put on and taking off regular lower body clothing?: A Little 6 Click Score: 21   End of Session Equipment Utilized During Treatment: Gait belt;Rolling walker (2 wheels)  Activity Tolerance: Patient tolerated treatment well Patient left: in bed;with call bell/phone within reach  OT Visit Diagnosis: Unsteadiness on feet (R26.81);Other abnormalities of gait and mobility (R26.89);Pain Pain - Right/Left:  (BLE)                Time: [id number] OT Time Calculation (min): 29 min Charges:  OT General Charges $OT Visit: 1 Visit OT  Evaluation $OT Eval Low Complexity: 1 Low OT Treatments $Self Care/Home Management : 8-22 mins  Joeseph Amor OTR/L  Acute Rehab Services  [phone] office number [phone] pager number   Joeseph Amor 07/23/2022, 11:24 AM

## 2022-07-23 NOTE — Evaluation (Signed)
Physical Therapy Evaluation Patient Details Name: Mary Ferguson MRN: [id number] DOB: [birth year] Today's Date: 07/23/2022  History of Present Illness  This is a 71 year old female with COPD/asthma and chronic hypoxemic respiratory failure who is prescribed 3 L of oxygen at baseline, diabetes mellitus type 2, coronary artery disease, hypertension, recurrent DVTs, class III obesity and CKD stage III who presents to the hospital after a fall.  Head CT with Age indeterminant infarct versus chronic ischemic changes in the right basal ganglia.  Spinal imaging: Laminectomy at L4-L5 and L5-S1, Remote T11 and L1 fractures.  Clinical Impression  Patient presents with decreased mobility due to recent fall and history of falls at home.  She reports this episode slid from the bed and was not feeling well.  Currently S for in room ambulation for several laps with RW.  Previously functioning using RW at home so not too far from her baseline.  She has motivation and capability of improving her mobility with continued skilled PT in the acute setting and with follow up outpatient PT.   Will continue to follow.      Recommendations for follow up therapy are one component of a multi-disciplinary discharge planning process, led by the attending physician.  Recommendations may be updated based on patient status, additional functional criteria and insurance authorization.  Follow Up Recommendations Outpatient PT      Assistance Recommended at Discharge Intermittent Supervision/Assistance  Patient can return home with the following  Assistance with cooking/housework;Assist for transportation;Help with stairs or ramp for entrance    Equipment Recommendations None recommended by PT  Recommendations for Other Services       Functional Status Assessment Patient has had a recent decline in their functional status and demonstrates the ability to make significant improvements in function in a reasonable and predictable  amount of time.     Precautions / Restrictions Precautions Precautions: Fall Precaution Comments: about 6 falls in 6 months      Mobility  Bed Mobility Overal bed mobility: Modified Independent                  Transfers Overall transfer level: Modified independent                      Ambulation/Gait Ambulation/Gait assistance: Supervision Gait Distance (Feet): 120 Feet Assistive device: Rolling walker (2 wheels) Gait Pattern/deviations: Step-through pattern, Decreased stride length, Trunk flexed, Shuffle       General Gait Details: maintaining postural flexion throughout despite reporting increased back pain after walking couple of laps in the room, the wanting to continue a little more  Stairs            Wheelchair Mobility    Modified Rankin (Stroke Patients Only)       Balance Overall balance assessment: Needs assistance Sitting-balance support: Feet supported Sitting balance-Leahy Scale: Good     Standing balance support: Single extremity supported, Bilateral upper extremity supported, During functional activity Standing balance-Leahy Scale: Poor Standing balance comment: completed toilet hygiene in sitting, standing using rail then walking with RW and using some support leaning on sink while washing hands                             Pertinent Vitals/Pain Pain Assessment Pain Assessment: 0-10 Pain Score: 8  Pain Location: headache and feels shaky Pain Descriptors / Indicators: Aching Pain Intervention(s): Monitored during session    Home Living Family/patient expects to be  discharged to:: Private residence Living Arrangements: Children (son and daughter in Sports [employer]) Available Help at Discharge: Family Type of Home: House Home Access: [place]: One [place]: Tub bench;Cane - single point;Rollator (4 wheels);Rolling Walker (2 wheels);Wheelchair - manual      Prior Function Prior  Level of Function : Needs assist             Mobility Comments: reports has not driven since had Covid in 2020; walks about 10' then has pain in back, feet and legs ADLs Comments: waits to shower till someone is home; son and daughter in law work and run around a lot     [place]: Right    Extremity/Trunk Assessment   Upper Extremity Assessment Upper Extremity Assessment: Defer to OT evaluation    Lower Extremity Assessment Lower Extremity Assessment: Generalized weakness    Cervical / Trunk Assessment Cervical / Trunk Assessment: Kyphotic;Lordotic;Other exceptions Cervical / Trunk Exceptions: forward head, increased body habitus  Communication   Communication: No difficulties  Cognition Arousal/Alertness: Awake/alert Behavior During Therapy: WFL for tasks assessed/performed Overall Cognitive Status: Within Functional Limits for tasks assessed                                 General Comments: very alert and oriented and aware of her medical issues considering options since family isn't around much, but stated only on floor a couple of hours when chart indicates a couple of days        General Comments General comments (skin integrity, edema, etc.): HR up to 140's with ambulation, BP stable with transitions.  Reports had diarrhea throughout the night and walked to bathroom wtih staff each time    Exercises     Assessment/Plan    PT Assessment Patient needs continued PT services  PT Problem List Decreased strength;Decreased balance;Decreased cognition;Decreased mobility;Decreased knowledge of use of DME;Decreased activity tolerance       PT Treatment Interventions DME instruction;Functional mobility training;Balance training;Patient/family education;Therapeutic activities;Gait training;Therapeutic exercise    PT Goals (Current goals can be found in the Care Plan section)  Acute Rehab PT Goals Patient Stated Goal: to get  stronger, able to tolerate activity better PT Goal Formulation: With patient Time For Goal Achievement: 08/06/22 Potential to Achieve Goals: Good    Frequency Min 3X/week     Co-evaluation               AM-PAC PT "6 Clicks" Mobility  Outcome Measure Help needed turning from your back to your side while in a flat bed without using bedrails?: None Help needed moving from lying on your back to sitting on the side of a flat bed without using bedrails?: None Help needed moving to and from a bed to a chair (including a wheelchair)?: A Little Help needed standing up from a chair using your arms (e.g., wheelchair or bedside chair)?: None Help needed to walk in hospital room?: A Little Help needed climbing 3-5 steps with a railing? : Total 6 Click Score: 19    End of Session   Activity Tolerance: Patient limited by fatigue Patient left: in bed;with call bell/phone within reach (seated on EOB)   PT Visit Diagnosis: Other abnormalities of gait and mobility (R26.89);Muscle weakness (generalized) (M62.81);History of falling (Z91.81)    Time: 1015-1050 PT Time Calculation (min) (ACUTE ONLY): 35 min   Charges:  PT Evaluation $PT Eval Moderate Complexity: 1 Mod PT Treatments $Gait Training: 8-22 mins        Sheran Lawless, PT Acute Rehabilitation Services Office:[phone] 07/23/2022   Elray Mcgregor 07/23/2022, 11:08 AM

## 2022-07-24 DIAGNOSIS — M6282 Rhabdomyolysis: Secondary | ICD-10-CM | POA: Diagnosis not present

## 2022-07-24 DIAGNOSIS — K5792 Diverticulitis of intestine, part unspecified, without perforation or abscess without bleeding: Secondary | ICD-10-CM | POA: Diagnosis not present

## 2022-07-24 LAB — COMPREHENSIVE METABOLIC PANEL
ALT: 19 U/L (ref 0–44)
AST: 31 U/L (ref 15–41)
Albumin: 2.1 g/dL — ABNORMAL LOW (ref 3.5–5.0)
Alkaline Phosphatase: 59 U/L (ref 38–126)
Anion gap: 9 (ref 5–15)
BUN: 8 mg/dL (ref 8–23)
CO2: 23 mmol/L (ref 22–32)
Calcium: 8.7 mg/dL — ABNORMAL LOW (ref 8.9–10.3)
Chloride: 105 mmol/L (ref 98–111)
Creatinine, Ser: 0.98 mg/dL (ref 0.44–1.00)
GFR, Estimated: >60 mL/min (ref >60)
Glucose, Bld: 121 mg/dL — ABNORMAL HIGH (ref 70–99)
Potassium: 3.6 mmol/L (ref 3.5–5.1)
Sodium: 137 mmol/L (ref 135–145)
Total Bilirubin: 0.8 mg/dL (ref 0.3–1.2)
Total Protein: 5.6 g/dL — ABNORMAL LOW (ref 6.5–8.1)

## 2022-07-24 LAB — GLUCOSE, CAPILLARY
Glucose-Capillary: 114 mg/dL — ABNORMAL HIGH (ref 70–99)
Glucose-Capillary: 132 mg/dL — ABNORMAL HIGH (ref 70–99)
Glucose-Capillary: 124 mg/dL — ABNORMAL HIGH (ref 70–99)
Glucose-Capillary: 131 mg/dL — ABNORMAL HIGH (ref 70–99)

## 2022-07-24 MED ORDER — METFORMIN HCL 500 MG PO TABS
500.0000 mg | ORAL_TABLET | Freq: Two times a day (BID) | ORAL | Status: DC
  Administered 2022-07-24 – 2022-07-25 (×2): 500 mg via ORAL
  Filled 2022-07-24 (×2): qty 1

## 2022-07-24 MED ORDER — PSYLLIUM 95 % PO PACK
1.0000 | PACK | Freq: Every day | ORAL | Status: DC
  Filled 2022-07-24 (×2): qty 1

## 2022-07-24 MED ORDER — AMOXICILLIN-POT CLAVULANATE 875-125 MG PO TABS
1.0000 | ORAL_TABLET | Freq: Two times a day (BID) | ORAL | Status: DC
  Administered 2022-07-24 – 2022-07-25 (×2): 1 via ORAL
  Filled 2022-07-24 (×2): qty 1

## 2022-07-24 NOTE — Progress Notes (Signed)
Occupational Therapy Treatment Patient Details Name: Mary Ferguson MRN: [id number] DOB: 02/24/1951 Today's Date: 07/24/2022   History of present illness This is a 71 year old female with COPD/asthma and chronic hypoxemic respiratory failure who is prescribed 3 L of oxygen at baseline, diabetes mellitus type 2, coronary artery disease, hypertension, recurrent DVTs, class III obesity and CKD stage III who presents to the hospital after a fall.  Head CT with Age indeterminant infarct versus chronic ischemic changes in the right basal ganglia.  Spinal imaging: Laminectomy at L4-L5 and L5-S1, Remote T11 and L1 fractures.   OT comments  Pt making steady progress towards OT goals this session. Pt continues to present with decreased strength, impaired activity tolerance and generalized deconditioning . Session focus on BADL reeducation, functional mobility, increasing overall activity tolerance and education on econ strategies and LB AE. Overall, pt completes ADLs from Rw level with supervision, pt very aware of econ strategies and uses them effectively at home. Pt able to complete 20 ft of functional ambulation in room with Rw and supervision however pt reports fatigue needing to sit and rest. Education and demo provided on all LB AE for energy conservation. Pt would continue to benefit from skilled occupational therapy while admitted and after d/c to address the below listed limitations in order to improve overall functional mobility and facilitate independence with BADL participation. DC plan remains appropriate, will follow acutely per POC.      Recommendations for follow up therapy are one component of a multi-disciplinary discharge planning process, led by the attending physician.  Recommendations may be updated based on patient status, additional functional criteria and insurance authorization.    Follow Up Recommendations  Other (comment) (OP PT)     Assistance Recommended at Discharge Intermittent  Supervision/Assistance  Patient can return home with the following  A little help with bathing/dressing/bathroom;Assistance with cooking/housework;Assist for transportation   Equipment Recommendations  None recommended by OT    Recommendations for Other Services      Precautions / Restrictions Precautions Precautions: Fall Precaution Comments: about 6 falls in 6 months Restrictions Weight Bearing Restrictions: No       Mobility Bed Mobility               General bed mobility comments: pt greeted on toilet with NT, ended session with pt in recliner    Transfers Overall transfer level: Modified independent Equipment used: Rolling walker (2 wheels)               General transfer comment: + time and effort     Balance Overall balance assessment: Needs assistance Sitting-balance support: Feet supported, No upper extremity supported Sitting balance-Leahy Scale: Fair     Standing balance support: Bilateral upper extremity supported, During functional activity Standing balance-Leahy Scale: Poor Standing balance comment: due to pain in BLE limited WB and decreased activity tolerance prefers to lean on sink                           ADL either performed or assessed with clinical judgement   ADL Overall ADL's : Needs assistance/impaired     Grooming: Wash/dry hands;Standing;Supervision/safety         Lower Body Bathing Details (indicate cue type and reason): education provided on LB AE for bathing       Lower Body Dressing Details (indicate cue type and reason): education provided on LB AE for dressing ie sock aid and reacher with hand out provided Toilet Transfer:  Rolling walker (2 wheels);Regular Toilet;Grab bars;Supervision/safety Toilet Transfer Details (indicate cue type and reason): supervision for safety d/t decreased activity tolerance Toileting- Clothing Manipulation and Hygiene: Supervision/safety;Sit to/from Counsellor Details (indicate cue type and reason): pt reports using shower seat at baseline Functional mobility during ADLs: Supervision/safety;Rolling walker (2 wheels) General ADL Comments: ADL participation impacted by decreased activity tolerance    Extremity/Trunk Assessment Upper Extremity Assessment Upper Extremity Assessment: Generalized weakness   Lower Extremity Assessment Lower Extremity Assessment: Defer to PT evaluation   Cervical / Trunk Assessment Cervical / Trunk Assessment: Other exceptions Cervical / Trunk Exceptions: forward head, increased body habitus    Vision Baseline Vision/History: 0 No visual deficits (per gross assessment)     Perception Perception Perception: Within Functional Limits   Praxis Praxis Praxis: Intact    Cognition Arousal/Alertness: Awake/alert Behavior During Therapy: WFL for tasks assessed/performed Overall Cognitive Status: No family/caregiver present to determine baseline cognitive functioning                                 General Comments: following all commands and has good awareness into deficits        Exercises      Shoulder Instructions       General Comments HR max 128 bpm with activity, on RA with SpO2> 97%, education and handouts provided on energy conservation strategies as well as LB AE, pt reports she doesnt do any IADLs at baseline    Pertinent Vitals/ Pain       Pain Assessment Pain Assessment: Faces Faces Pain Scale: Hurts a little bit Pain Location: BLE, general discomfort with mobility Pain Descriptors / Indicators: Discomfort, Grimacing Pain Intervention(s): Limited activity within patient's tolerance, Monitored during session, Repositioned  Home Living                                          Prior Functioning/Environment              Frequency  Min 2X/week        Progress Toward Goals  OT Goals(current goals can now be found in the care plan section)   Progress towards OT goals: Progressing toward goals  Acute Rehab OT Goals Patient Stated Goal: to go home and improve strength OT Goal Formulation: With patient Time For Goal Achievement: 08/06/22 Potential to Achieve Goals: Good  Plan Discharge plan remains appropriate;Frequency remains appropriate    Co-evaluation                 AM-PAC OT "6 Clicks" Daily Activity     Outcome Measure   Help from another person eating meals?: None Help from another person taking care of personal grooming?: None Help from another person toileting, which includes using toliet, bedpan, or urinal?: A Little Help from another person bathing (including washing, rinsing, drying)?: A Little Help from another person to put on and taking off regular upper body clothing?: None Help from another person to put on and taking off regular lower body clothing?: A Little 6 Click Score: 21    End of Session Equipment Utilized During Treatment: Rolling walker (2 wheels);Other (comment) (pt requested to not wear gait belt)  OT Visit Diagnosis: Unsteadiness on feet (R26.81);Other abnormalities of gait and mobility (R26.89);Pain   Activity Tolerance Patient tolerated treatment well  Patient Left in chair;with call bell/phone within reach   Nurse Communication Mobility status;Other (comment) (teds for BLE swelling?)        Time: 8127-5170 OT Time Calculation (min): 41 min  Charges: OT General Charges $OT Visit: 1 Visit OT Treatments $Self Care/Home Management : 38-52 mins  Lenor Derrick., COTA/L Acute Rehabilitation Services [phone]   Barron Schmid 07/24/2022, 10:05 AM

## 2022-07-24 NOTE — Progress Notes (Signed)
[facility] PROGRESS NOTE    Progress Note  Mary Ferguson  ZOX:[id number] DOB: [birth year] DOA: 07/20/2022 PCP: Natalia Leatherwood, DO     Brief Narrative:   Mary Ferguson is an 71 y.o. female past medical history of asthma/COPD, chronic respiratory failure with hypoxia on 3 L of oxygen, diabetes mellitus type 2, coronary artery disease, recurrent DVTs, class III obesity, chronic kidney disease stage III comes into the hospital after a fall 3 days prior to admission for which she laid on the floor and her friend found her on the floor was hypoxic white count of 11 anion gap of 18 metabolic acidosis mild elevated LFTs and CK greater than 1500, CT head spine thoracic and lumbar spine indeterminate infarct of the right basal ganglia no acute fractures or dislocation. She was started on IV fluids    Assessment/Plan:   Traumatic Rhabdomyolysis Started on IV fluids rhabdomyolysis resolved.  Acute kidney injury/metabolic acidosis: Likely prerenal azotemia With a baseline creatinine of less than 1 on admission 1.3 resolved with fluid resuscitation.  Back pain/difficult to ambulate: Has been told in the past she has nerve root compression MRI of the lumbar spine showed L1 compression fracture but no nerve root impingement. PT evaluated the patient recommended home health PT continue current analgesics regimen.  Possible acute diverticulitis: CT reveals standing of the proximal colon, she was started on Unasyn which we will continue for 5 days.  She relates she has had loose stools since her gallbladder was removed 6 Lomotil as needed. Transition to oral Augmentin.  Still having diarrhea.  She still nauseated 2. She continues to have ongoing diarrhea use Lomotil and start Metamucil.  Elevated LFT's: Improving likely due to rhabdomyolysis.  Severe hypoalbuminemia/protein caloric malnutrition: Continue Ensure 3 times daily.  Hypokalemia: Likely due to acidosis and diarrhea, now  resolved.  Hypotension/essential hypertension: Amlodipine, Coreg and Imdur were held on admission due to high kalemia. Blood pressure is trending up amlodipine was resumed.  COPD/asthma: Ambulate can check saturations with ambulation.  History of DVT''s: Continue Xarelto.  Diabetes mellitus type 2: With an A1c of 7.1. She is not on any oral hypoglycemic agents as an outpatient. Will need to start her on metformin orally.    DVT prophylaxis: xarelto Family Communication:none Status is: Inpatient Remains inpatient appropriate because: Acute diverticulitis    Code Status:     Code Status Orders  (From admission, onward)           Start     Ordered   07/21/22 0026  Full code  Continuous        07/21/22 0044           Code Status History     Date Active Date Inactive Code Status Order ID Comments User Context   11/30/2019 0858 11/30/2019 1746 Full Code [id number]  Lennette Bihari, MD Inpatient   06/17/2019 2012 06/22/2019 2135 Full Code [id number]  Arty Baumgartner, NP ED   09/30/2018 0156 10/01/2018 1631 Full Code [id number]  Meredeth Ide, MD Inpatient         IV Access:   Peripheral IV   Procedures and diagnostic studies:   No results found.   Medical Consultants:   None.   Subjective:    [name] still nauseated and having diarrhea.  Objective:    Vitals:   07/23/22 2204 07/24/22 0544 07/24/22 0757 07/24/22 0938  BP: (!) 149/99 (!) 153/110 137/87   Pulse: (!) 108 (!) 108 (!) 121 (!) 110  Resp: 18 20 17    Temp: 98.2 F (36.8 C) 98.2 F (36.8 C) 98.5 F (36.9 C)   TempSrc: Oral Oral Oral   SpO2: 97% 95% 95%   Weight:      Height:       SpO2: 95 % O2 Flow Rate (L/min): 3 L/min FiO2 (%): 100 %   Intake/Output Summary (Last 24 hours) at 07/24/2022 1149 Last data filed at 07/23/2022 1859 Gross per 24 hour  Intake 240 ml  Output --  Net 240 ml   Filed Weights   07/20/22 1303  Weight: 110 kg    Exam: General exam: In no  acute distress. Respiratory system: Good air movement and clear to auscultation. Cardiovascular system: S1 & S2 heard, RRR. No JVD. Gastrointestinal system: Abdomen is nondistended, soft and nontender.  Extremities: No pedal edema. Skin: No rashes, lesions or ulcers Psychiatry: Judgement and insight appear normal. Mood & affect appropriate.    Data Reviewed:    Labs: Basic Metabolic Panel: Recent Labs  Lab 07/20/22 1318 07/20/22 1334 07/21/22 0600 07/22/22 0252 07/23/22 0834 07/24/22 0424  NA 137 136 137 138 139 137  K 4.5 4.4 4.3 3.8 3.4* 3.6  CL 99 102 105 107 106 105  CO2 20*  --  19* 23 22 23   GLUCOSE 132* 132* 68* 110* 122* 121*  BUN 12 15 12 11  6* 8  CREATININE 1.51* 1.30* 1.08* 1.01* 0.89 0.98  CALCIUM 9.6  --  8.4* 8.5* 8.9 8.7*  MG  --   --   --   --  1.5*  --    GFR Estimated Creatinine Clearance: 62.5 mL/min (by C-G formula based on SCr of 0.98 mg/dL). Liver Function Tests: Recent Labs  Lab 07/20/22 1318 07/21/22 0600 07/22/22 0252 07/23/22 0834 07/24/22 0424  AST 62* 34 39 36 31  ALT 26 16 16 21 19   ALKPHOS 74 49 55 69 59  BILITOT 2.5* 1.6* 1.6* 0.5 0.8  PROT 6.4* 4.3* 5.1* 6.3* 5.6*  ALBUMIN 2.7* 1.6* 1.9* 2.5* 2.1*   No results for input(s): "LIPASE", "AMYLASE" in the last 168 hours. No results for input(s): "AMMONIA" in the last 168 hours. Coagulation profile No results for input(s): "INR", "PROTIME" in the last 168 hours. COVID-19 Labs  No results for input(s): "DDIMER", "FERRITIN", "LDH", "CRP" in the last 72 hours.  Lab Results  Component Value Date   SARSCOV2NAA Not Detected 07/16/2021   SARSCOV2NAA NEGATIVE 11/26/2019   SARSCOV2NAA NEGATIVE 06/17/2019    CBC: Recent Labs  Lab 07/20/22 1318 07/20/22 1334 07/21/22 0515 07/22/22 0252  WBC 11.9*  --  9.9 6.1  NEUTROABS 9.9*  --   --   --   HGB 15.0 16.3* 13.6 11.7*  HCT 46.0 48.0* 44.0 36.7  MCV 81.7  --  85.6 83.2  PLT 280  --  258 262   Cardiac Enzymes: Recent Labs  Lab  07/20/22 1318 07/21/22 0600  CKTOTAL 1,651* 477*   BNP (last 3 results) No results for input(s): "PROBNP" in the last 8760 hours. CBG: Recent Labs  Lab 07/23/22 1221 07/23/22 1609 07/23/22 2206 07/24/22 0735 07/24/22 1129  GLUCAP 148* 156* 123* 114* 132*   D-Dimer: No results for input(s): "DDIMER" in the last 72 hours. Hgb A1c: Recent Labs    07/23/22 0834  HGBA1C 7.2*   Lipid Profile: No results for input(s): "CHOL", "HDL", "LDLCALC", "TRIG", "CHOLHDL", "LDLDIRECT" in the last 72 hours. Thyroid function studies: No results for input(s): "TSH", "T4TOTAL", "T3FREE", "THYROIDAB" in  the last 72 hours.  Invalid input(s): "FREET3" Anemia work up: No results for input(s): "VITAMINB12", "FOLATE", "FERRITIN", "TIBC", "IRON", "RETICCTPCT" in the last 72 hours. Sepsis Labs: Recent Labs  Lab 07/20/22 1318 07/21/22 0515 07/22/22 0252  WBC 11.9* 9.9 6.1   Microbiology No results found for this or any previous visit (from the past 240 hour(s)).   Medications:    acetaminophen  650 mg Oral QID   amitriptyline  50 mg Oral QHS   amLODipine  2.5 mg Oral Daily   feeding supplement (GLUCERNA SHAKE)  237 mL Oral BID BM   insulin aspart  0-5 Units Subcutaneous QHS   insulin aspart  0-9 Units Subcutaneous TID WC   protein supplement  1 Scoop Oral TID WC   rivaroxaban  20 mg Oral Q supper   Continuous Infusions:  ampicillin-sulbactam (UNASYN) IV 3 g (07/24/22 1139)      LOS: 4 days   Marinda Elk  [facility]  07/24/2022, 11:49 AM

## 2022-07-24 NOTE — Progress Notes (Signed)
PT Cancellation Note  Patient Details Name: Mary Ferguson MRN: [id number] DOB: Jan 16, 1951   Cancelled Treatment:    Reason Eval/Treat Not Completed: Other (comment) Attempted to see pt for PT tx. Pt received in showering requesting an hour to complete. Will f/u as able.  Aleda Grana, PT, DPT 07/24/22, 10:31 AM   Sandi Mariscal 07/24/2022, 10:31 AM

## 2022-07-24 NOTE — Care Management Important Message (Signed)
Important Message  Patient Details  Name: Mary Ferguson MRN: [id number] Date of Birth: 03/15/1951   Medicare Important Message Given:  Yes     Sherilyn Banker 07/24/2022, 11:02 AM

## 2022-07-24 NOTE — Progress Notes (Signed)
Physical Therapy Treatment Patient Details Name: Mary Ferguson MRN: [id number] DOB: Aug 19, 1951 Today's Date: 07/24/2022   History of Present Illness This is a 71 year old female with COPD/asthma and chronic hypoxemic respiratory failure who is prescribed 3 L of oxygen at baseline, diabetes mellitus type 2, coronary artery disease, hypertension, recurrent DVTs, class III obesity and CKD stage III who presents to the hospital after a fall.  Head CT with Age indeterminant infarct versus chronic ischemic changes in the right basal ganglia.  Spinal imaging: Laminectomy at L4-L5 and L5-S1, Remote T11 and L1 fractures.    PT Comments    Pt seen for PT tx with pt agreeable to tx. Pt received & remained on room air throughout session. Pt notes prior to admission she has required PRN use of supplemental O2 since having Covid in 2020. On this date, pt is mod I with bed mobility, CGA<>supervision for STS & ambulates multiple laps in room with RW & CGA. Pt experiences 2-3 mild LOB during session requiring CGA<>min assist for recovery. Upon PT asking, pt does report feeling dizzy with positional changes (supine>sit, STS) but notes this has been occurring for awhile. No nystagmus was observed during session. Pt may benefit from orthostatic vital signs being checked - nurse notified. Will continue to follow pt acutely to address balance, endurance, and safety with gait with LRAD.    Recommendations for follow up therapy are one component of a multi-disciplinary discharge planning process, led by the attending physician.  Recommendations may be updated based on patient status, additional functional criteria and insurance authorization.  Follow Up Recommendations  Outpatient PT     Assistance Recommended at Discharge Intermittent Supervision/Assistance  Patient can return home with the following Assistance with cooking/housework;Assist for transportation;Help with stairs or ramp for entrance;A little help with  walking and/or transfers   Equipment Recommendations  None recommended by PT    Recommendations for Other Services       Precautions / Restrictions Precautions Precautions: Fall Precaution Comments: about 6 falls in 6 months Restrictions Weight Bearing Restrictions: No     Mobility  Bed Mobility Overal bed mobility: Modified Independent             General bed mobility comments: supine>sit with HOB slightly elevated    Transfers Overall transfer level: Modified independent Equipment used: Rolling walker (2 wheels)               General transfer comment: STS with supervision from EOB & recliner    Ambulation/Gait Ambulation/Gait assistance: Min guard Gait Distance (Feet):  (50 + 40 ft) Assistive device: Rolling walker (2 wheels) Gait Pattern/deviations: Step-through pattern, Decreased stride length, Trunk flexed, Shuffle Gait velocity: decreased     General Gait Details: Pt with flexed posture throughout, shuffled gait with cuing for increased foot clearance BLE. Pt declines ambulating in the hallway so instead performs multiple laps in the room. Eager to walk.   Stairs             Wheelchair Mobility    Modified Rankin (Stroke Patients Only)       Balance Overall balance assessment: Needs assistance Sitting-balance support: Feet supported, No upper extremity supported Sitting balance-Leahy Scale: Good Sitting balance - Comments: supervision static sitting   Standing balance support: Bilateral upper extremity supported, During functional activity, Reliant on assistive device for balance Standing balance-Leahy Scale: Poor Standing balance comment: Pt with 2-3 LOB throughout functional mobility with CGA<>min assist to correct. Pt reports feeling "wonky" at times.  Cognition Arousal/Alertness: Awake/alert Behavior During Therapy: WFL for tasks assessed/performed Overall Cognitive Status: No  family/caregiver present to determine baseline cognitive functioning                                 General Comments: Follows commands throughout session. Unclear explanation of pt's use of home O2 without multiple questions.        Exercises      General Comments General comments (skin integrity, edema, etc.): Max HR 140 bpm after 2nd ambulation trial (127 bpm after first) but decreases to 125 bpm quickly with rest. SpO2 >90% throughout & pt on room air.      Pertinent Vitals/Pain Pain Assessment Pain Assessment: No/denies pain    Home Living                          Prior Function            PT Goals (current goals can now be found in the care plan section) Acute Rehab PT Goals Patient Stated Goal: to get stronger, able to tolerate activity better PT Goal Formulation: With patient Time For Goal Achievement: 08/06/22 Potential to Achieve Goals: Good Progress towards PT goals: Progressing toward goals    Frequency    Min 3X/week      PT Plan Current plan remains appropriate    Co-evaluation              AM-PAC PT "6 Clicks" Mobility   Outcome Measure  Help needed turning from your back to your side while in a flat bed without using bedrails?: None Help needed moving from lying on your back to sitting on the side of a flat bed without using bedrails?: None Help needed moving to and from a bed to a chair (including a wheelchair)?: A Little Help needed standing up from a chair using your arms (e.g., wheelchair or bedside chair)?: A Little Help needed to walk in hospital room?: A Little Help needed climbing 3-5 steps with a railing? : A Lot 6 Click Score: 19    End of Session   Activity Tolerance: Patient tolerated treatment well Patient left: in chair;with chair alarm set;with call bell/phone within reach   PT Visit Diagnosis: Other abnormalities of gait and mobility (R26.89);Muscle weakness (generalized) (M62.81);History of  falling (Z91.81)     Time: [id number] PT Time Calculation (min) (ACUTE ONLY): 25 min  Charges:  $Therapeutic Activity: 23-37 mins                     Lavone Nian, PT, DPT 07/24/22, 2:39 PM   Waunita Schooner 07/24/2022, 2:37 PM

## 2022-07-25 DIAGNOSIS — R748 Abnormal levels of other serum enzymes: Secondary | ICD-10-CM

## 2022-07-25 DIAGNOSIS — N189 Chronic kidney disease, unspecified: Secondary | ICD-10-CM

## 2022-07-25 DIAGNOSIS — I10 Essential (primary) hypertension: Secondary | ICD-10-CM

## 2022-07-25 DIAGNOSIS — K5792 Diverticulitis of intestine, part unspecified, without perforation or abscess without bleeding: Secondary | ICD-10-CM | POA: Diagnosis not present

## 2022-07-25 DIAGNOSIS — M6282 Rhabdomyolysis: Secondary | ICD-10-CM | POA: Diagnosis not present

## 2022-07-25 DIAGNOSIS — N179 Acute kidney failure, unspecified: Secondary | ICD-10-CM | POA: Diagnosis not present

## 2022-07-25 LAB — GLUCOSE, CAPILLARY
Glucose-Capillary: 120 mg/dL — ABNORMAL HIGH (ref 70–99)
Glucose-Capillary: 99 mg/dL (ref 70–99)

## 2022-07-25 MED ORDER — METFORMIN HCL 500 MG PO TABS: 500.0000 mg | ORAL_TABLET | Freq: Two times a day (BID) | ORAL | 3 refills | Status: DC

## 2022-07-25 MED ORDER — PSYLLIUM 95 % PO PACK: 1.0000 | PACK | Freq: Every day | ORAL | 2 refills | Status: AC

## 2022-07-25 MED ORDER — AMOXICILLIN-POT CLAVULANATE 875-125 MG PO TABS: 1.0000 | ORAL_TABLET | Freq: Two times a day (BID) | ORAL | 0 refills | Status: DC

## 2022-07-25 MED ORDER — DIPHENOXYLATE-ATROPINE 2.5-0.025 MG PO TABS: 1.0000 | ORAL_TABLET | Freq: Four times a day (QID) | ORAL | 0 refills | Status: DC | PRN

## 2022-07-25 NOTE — Progress Notes (Signed)
Mobility Specialist - Progress Note   07/25/22 1000  Mobility  Activity Refused mobility    Pt received in recliner, stated she had diarrhea this morning and was worried if she moved she would not be able to prevent a BM. Will follow up as time allows. Left in recliner w/ call bell in reach and all needs met.   [place] Specialist Please contact via SecureChat or Rehab office at [phone]

## 2022-07-25 NOTE — Progress Notes (Signed)
Physical Therapy Treatment/Vestibular Evaluation Patient Details Name: Mary Ferguson MRN: [id number] DOB: 1950/12/20 Today's Date: 07/25/2022   History of Present Illness This is a 70 year old female with COPD/asthma and chronic hypoxemic respiratory failure who is prescribed 3 L of oxygen at baseline, diabetes mellitus type 2, coronary artery disease, hypertension, recurrent DVTs, class III obesity and CKD stage III who presents to the hospital after a fall.  Head CT with Age indeterminant infarct versus chronic ischemic changes in the right basal ganglia.  Spinal imaging: Laminectomy at L4-L5 and L5-S1, Remote T11 and L1 fractures.    PT Comments    Patient with subjective report sounding like BPPV, but testing for ant/post and horizontal canal without any nystagmus seen.  Did have brief and very mild symptoms initially in R sidelying position  Patient with startle response with head thrust test so unable to determine if has peripheral hypofunction.  Did educate in using visual compensation for possible vestibular dysfunction.  She will hopefully benefit with follow up outpatient PT for balance and general conditioning.  PT will follow up if not d/c.    Vestibular Assessment - 07/25/22 1312       Symptom Behavior   Subjective history of current problem Reports having dizziness symptoms ever since her first hernia surgery.  Describes episodic dizziness that occurs with different movement and difficult to describe.  Does feel she has it when returning to supine too quickly.    Type of Dizziness  Unsteady with head/body turns;Spinning;"Funny feeling in head";Imbalance    Frequency of Dizziness Intermittent    Duration of Dizziness few seconds to minutes    Symptom Nature Motion provoked;Intermittent;Variable;Positional    Aggravating Factors Activity in general;Turning head quickly   sit to supine   Relieving Factors Closing eyes    Progression of Symptoms No change since onset      Oculomotor  Exam   Oculomotor Alignment Normal    Ocular ROM WFL    Spontaneous Absent    Gaze-induced  Absent    Smooth Pursuits Intact    Saccades Intact      Oculomotor Exam-Fixation Suppressed    Left Head Impulse patient with startle response with quick head turn even after repeat testing and closing eyes, so unable to determine if has refixation or not.      Vestibulo-Ocular Reflex   VOR Cancellation Normal      Positional Testing   Sidelying Test Sidelying Right;Sidelying Left    Horizontal Canal Testing Horizontal Canal Right;Horizontal Canal Left      Sidelying Right   Sidelying Right Duration 45 sec    Sidelying Right Symptoms No nystagmus      Sidelying Left   Sidelying Left Duration 45 sec    Sidelying Left Symptoms No nystagmus      Horizontal Canal Right   Horizontal Canal Right Duration 45 sec    Horizontal Canal Right Symptoms Normal      Horizontal Canal Left   Horizontal Canal Left Duration 45 sec    Horizontal Canal Left Symptoms Normal              Recommendations for follow up therapy are one component of a multi-disciplinary discharge planning process, led by the attending physician.  Recommendations may be updated based on patient status, additional functional criteria and insurance authorization.  Follow Up Recommendations  Outpatient PT     Assistance Recommended at Discharge Intermittent Supervision/Assistance  Patient can return home with the following Assistance with cooking/housework;Assist for transportation;Help with  stairs or ramp for entrance;A little help with walking and/or transfers   Equipment Recommendations  None recommended by PT    Recommendations for Other Services       Precautions / Restrictions Precautions Precautions: Fall Precaution Comments: about 6 falls in 6 months     Mobility  Bed Mobility Overal bed mobility: Needs Assistance Bed Mobility: Sit to Supine       Sit to supine: Min assist   General bed mobility  comments: assist for legs onto bed today    Transfers Overall transfer level: Needs assistance Equipment used: Rolling walker (2 wheels) Transfers: Sit to/from Stand Sit to Stand: Supervision           General transfer comment: up from recliner and EOB    Ambulation/Gait Ambulation/Gait assistance: Supervision Gait Distance (Feet): 5 Feet Assistive device: Rolling walker (2 wheels)         General Gait Details: stepping to bed from recliner   Stairs             Wheelchair Mobility    Modified Rankin (Stroke Patients Only)       Balance Overall balance assessment: Needs assistance   Sitting balance-Leahy Scale: Good       Standing balance-Leahy Scale: Fair Standing balance comment: standing at EOB not touching bed and no UE support during static balance                            Cognition Arousal/Alertness: Awake/alert Behavior During Therapy: WFL for tasks assessed/performed Overall Cognitive Status: Within Functional Limits for tasks assessed                                          Exercises      General Comments General comments (skin integrity, edema, etc.): Educated in use of stationary visual target for compensation if feeling more dizzy/off balance.  Also educated outpatient PT can continue to work on balance which may help with reducing symptoms of dizziness/imbalance.      Pertinent Vitals/Pain Pain Assessment Faces Pain Scale: Hurts a little bit Pain Location: LE's due to swelling Pain Descriptors / Indicators: Discomfort, Grimacing Pain Intervention(s): Monitored during session    Home Living                          Prior Function            PT Goals (current goals can now be found in the care plan section) Progress towards PT goals: Progressing toward goals    Frequency           PT Plan Current plan remains appropriate    Co-evaluation              AM-PAC PT "6  Clicks" Mobility   Outcome Measure  Help needed turning from your back to your side while in a flat bed without using bedrails?: None Help needed moving from lying on your back to sitting on the side of a flat bed without using bedrails?: None Help needed moving to and from a bed to a chair (including a wheelchair)?: A Little Help needed standing up from a chair using your arms (e.g., wheelchair or bedside chair)?: A Little Help needed to walk in hospital room?: A Little Help needed climbing 3-5 steps with a railing? :  Total 6 Click Score: 18    End of Session   Activity Tolerance: Patient tolerated treatment well Patient left: with call bell/phone within reach;in bed;with bed alarm set   PT Visit Diagnosis: Other abnormalities of gait and mobility (R26.89);Muscle weakness (generalized) (M62.81);History of falling (Z91.81)     Time: 5364-6803 PT Time Calculation (min) (ACUTE ONLY): 38 min  Charges:  $Therapeutic Activity: 8-22 mins $Neuromuscular Re-education: 8-22 mins $Self Care/Home Management: 8-22                     Sheran Lawless, PT Acute Rehabilitation Services Office:[phone] 07/25/2022    Elray Mcgregor 07/25/2022, 1:26 PM

## 2022-07-25 NOTE — Discharge Summary (Addendum)
Physician Discharge Summary  Londell MohLyle Wawrzyniak ZOX:[id number] DOB: 1950-12-12 DOA: 07/20/2022  PCP: Natalia LeatherwoodKuneff, Renee A, DO  Admit date: 07/20/2022 Discharge date: 07/25/2022  Admitted From: Home Disposition:  Home  Recommendations for Outpatient Follow-up:  Follow up with PCP in 1-2 weeks, titrate metformin as needed as an outpatient. Please obtain BMP/CBC in one week   Home Health:Yes Equipment/Devices:none  Discharge Condition:Stable CODE STATUS:Full Diet recommendation: Heart Healthy  Brief/Interim Summary:  71 y.o. female past medical history of asthma/COPD, chronic respiratory failure with hypoxia on 3 L of oxygen, diabetes mellitus type 2, coronary artery disease, recurrent DVTs, class III obesity, chronic kidney disease stage III comes into the hospital after a fall 3 days prior to admission for which she laid on the floor and her friend found her on the floor was hypoxic white count of 11 anion gap of 18 metabolic acidosis mild elevated LFTs and CK greater than 1500, CT head spine thoracic and lumbar spine indeterminate infarct of the right basal ganglia no acute fractures or dislocation.   Discharge Diagnoses:  Principal Problem:   Rhabdomyolysis Active Problems:   Essential hypertension   Dyslipidemia, goal LDL below 70   Abdominal wall hernia   History of recurrent deep vein thrombosis (DVT)   Acute on chronic respiratory failure with hypoxemia (HCC)   Type II diabetes mellitus with manifestations (HCC)   Acute diverticulitis   Acute kidney injury superimposed on chronic kidney disease (HCC)   Fall at home, initial encounter   High anion gap metabolic acidosis   Elevated liver enzymes  Traumatic rhabdomyolysis:  Likely due to inmobility, resolved with IV resuscitation  Acute kidney injury/metabolic acidosis, Multifactorial acute renal azotemia and rhabdomyolysis she was resuscitated and her creatinine returned to be slime.  Back pain/difficulty ambulating: MRI of the  lumbar spine showed L1 compression fracture which is old, but no nerve impingement. Physical therapy evaluated the patient recommended home health PT. Her pain seems to be controlled with analgesics. She relates she will not want home health at home she would go to a facility as an outpatient to get her PT.  Acute diverticulitis: CT revealed stranding of the proximal colon she was started on IV Unasyn, transition to oral Augmentin which she will continue as an outpatient. She relates she has been having diarrhea since she had her cholecystectomy, She was started on Metamucil and Lomotil and her bowel movements slowed down.  Elevated LFTs: Likely due to rhabdomyolysis, now resolved.  Severe hypoalbuminemia/protein caloric malnutrition: Continue Ensure 3 times daily.  Hypokalemia: Likely due to acidosis and diarrhea now resolved.  Hypotension/essential hypertension: Antihypertensive medications were held on admission. Her blood pressure improved she was started on Norvasc in house she will resume her home regimen as an outpatient as her blood pressure is trending up.  COPD/asthma: Noted saturations remained stable.  History of DVT: Continue Xarelto.  Diabetes mellitus type 2 uncontrolled with hyperglycemia: With an A1c of 7.1, she has been previously on Farxiga as an outpatient but she cannot afford it so she stopped it. She was changed to oral metformin as she did not require any further insulin and how she will continue metformin as an outpatient her PCP to titrate oral hypoglycemic agent metformin as an outpatient as tolerated.   Discharge Instructions  Discharge Instructions     Diet - low sodium heart healthy   Complete by: As directed    Increase activity slowly   Complete by: As directed       Allergies as of  07/25/2022       Reactions   Rofecoxib Anaphylaxis   VIOXX   Metformin And Related Other (See Comments)   Ibs    Benadryl Allergy [diphenhydramine Hcl]  Other (See Comments)   Depressed respirations   Cephalexin Rash   Gatifloxacin Other (See Comments)   Confusion Very low blood pressure Teequinn        Medication List     TAKE these medications    acetaminophen 500 MG tablet Commonly known as: TYLENOL Take 500-1,000 mg by mouth as needed for mild pain or moderate pain.   albuterol 108 (90 Base) MCG/ACT inhaler Commonly known as: VENTOLIN HFA Inhale 2 puffs into the lungs every 4 (four) hours as needed for wheezing or shortness of breath. What changed: when to take this   amitriptyline 50 MG tablet Commonly known as: ELAVIL Take 1 tablet (50 mg total) by mouth at bedtime.   amLODipine 2.5 MG tablet Commonly known as: NORVASC Take 1 tablet (2.5 mg total) by mouth daily.   amoxicillin-clavulanate 875-125 MG tablet Commonly known as: AUGMENTIN Take 1 tablet by mouth every 12 (twelve) hours.   atorvastatin 80 MG tablet Commonly known as: LIPITOR Take 1 tablet (80 mg total) by mouth every evening. What changed: when to take this   budesonide-formoterol 80-4.5 MCG/ACT inhaler Commonly known as: Symbicort Inhale 2 puffs into the lungs 2 (two) times daily.   carvedilol 6.25 MG tablet Commonly known as: COREG TAKE 1 TABLET BY MOUTH IN THE MORNING AND 2 TABLETS IN THE EVENING What changed:  how much to take how to take this when to take this additional instructions   clotrimazole 1 % cream Commonly known as: Clotrimazole Anti-Fungal Apply 1 Application topically 2 (two) times daily. What changed:  when to take this reasons to take this   dicyclomine 20 MG tablet Commonly known as: BENTYL Take 1 tablet (20 mg total) by mouth 4 (four) times daily as needed for spasms. What changed: when to take this   diphenoxylate-atropine 2.5-0.025 MG tablet Commonly known as: LOMOTIL Take 1 tablet by mouth 4 (four) times daily as needed for diarrhea or loose stools.   famotidine 40 MG tablet Commonly known as:  PEPCID Take 1 tablet (40 mg total) by mouth at bedtime.   hydrOXYzine 25 MG tablet Commonly known as: ATARAX Take 1 tablet (25 mg total) by mouth every 8 (eight) hours as needed for itching. What changed: when to take this   ipratropium-albuterol 0.5-2.5 (3) MG/3ML Soln Commonly known as: DUONEB Take 3 mLs by nebulization every 4 (four) hours as needed. What changed:  when to take this reasons to take this   isosorbide mononitrate 60 MG 24 hr tablet Commonly known as: IMDUR TAKE 1 TABLET(60 MG) BY MOUTH DAILY What changed:  how much to take how to take this when to take this additional instructions   metFORMIN 500 MG tablet Commonly known as: GLUCOPHAGE Take 1 tablet (500 mg total) by mouth 2 (two) times daily with a meal.   nitroGLYCERIN 0.4 MG SL tablet Commonly known as: NITROSTAT PLACE 1 TABLET UNDER THE TONGUE EVERY 5 MINUTES FOR 3 DOSES AS NEEDED FOR CHEST PAIN What changed: See the new instructions.   ondansetron 4 MG tablet Commonly known as: ZOFRAN Take 1 tablet (4 mg total) by mouth every 8 (eight) hours as needed for nausea or vomiting. What changed: when to take this   psyllium 95 % Pack Commonly known as: HYDROCIL/METAMUCIL Take 1 packet by mouth  daily. Start taking on: July 26, 2022   rivaroxaban 20 MG Tabs tablet Commonly known as: Xarelto TAKE 1 TABLET(20 MG) BY MOUTH EVERY EVENING What changed:  how much to take how to take this when to take this additional instructions   Vitamin D3 50 MCG (2000 UT) capsule Take 2 capsules (4,000 Units total) by mouth daily. What changed: how much to take        Follow-up Information     Kuneff, Renee A, DO. Schedule an appointment as soon as possible for a visit.   Specialty: Family Medicine Contact information: [address] [place] [address] [phone]         Tomma Lightning, MD. Schedule an appointment as soon as possible for a visit.   Specialty: Pulmonary Disease Contact  information: [address] [place] [address] [place] [address] [phone]         [facility] Follow up.   Specialty: Rehabilitation Why: Referral has been submitted, for faster scheduling please call office to set up appointment Contact information: [address]  [address] [phone] mc [address] [place] [address] [phone]               Allergies  Allergen Reactions   Rofecoxib Anaphylaxis    VIOXX   Metformin And Related Other (See Comments)    Ibs    Benadryl Allergy [Diphenhydramine Hcl] Other (See Comments)    Depressed respirations   Cephalexin Rash   Gatifloxacin Other (See Comments)    Confusion Very low blood pressure Teequinn    Consultations: None   Procedures/Studies: MR THORACIC SPINE W WO CONTRAST  Result Date: 07/22/2022 CLINICAL DATA:  Chronic mid to low back pain. EXAM: MRI THORACIC AND LUMBAR SPINE WITHOUT AND WITH CONTRAST TECHNIQUE: Multiplanar and multiecho pulse sequences of the thoracic and lumbar spine were obtained without and with intravenous contrast. CONTRAST:  3mL GADAVIST GADOBUTROL 1 MMOL/ML IV SOLN COMPARISON:  CTs of the chest, abdomen and pelvis and thoracic and lumbar spine 07/20/2022. FINDINGS: MRI THORACIC SPINE FINDINGS Alignment: Mildly exaggerated thoracic kyphosis. No focal angulation or listhesis. Vertebrae: No evidence of acute fracture or traumatic subluxation. Minimally heterogeneous marrow signal without suspicious lesion on inversion recovery imaging. No abnormal osseous enhancement. Cord: The thoracic cord appears normal in signal and caliber.No abnormal intradural enhancement. Paraspinal and other soft tissues: No significant paraspinal findings. Trace bilateral pleural effusions. Disc levels: No evidence of discitis or osteomyelitis. Mild multilevel spondylosis with paraspinal osteophytes, partial ankylosing at T9-10. Scattered small disc protrusions, most notable  at T6-7 and T7-8. Multilevel facet hypertrophy. No cord deformity or large disc herniation identified. Facet hypertrophy contributes to mild foraminal narrowing at multiple levels. MRI LUMBAR SPINE FINDINGS Segmentation: Conventional anatomy assumed, with the last open disc space designated L5-S1.Concordant with prior imaging. Alignment: Stable minimal anterolisthesis at L5-S1 secondary to chronic bilateral L5 pars defects. Vertebrae: Chronic superior endplate compression deformity at L1 as seen on recent CT. No associated marrow edema or abnormal enhancement. No acute osseous findings. Chronic bilateral L5 pars defects. Mild sacroiliac degenerative changes bilaterally. Conus medullaris: Extends to the L1-2 level and appears normal. No abnormal intradural enhancement. Paraspinal and other soft tissues: No significant paraspinal findings. Bilateral renal sinus cysts, unchanged from recent CT; no follow-up imaging recommended. Disc levels: T12-L1: Mild disc bulging and facet hypertrophy. No spinal stenosis or nerve root encroachment. L1-2: Disc height and hydration are maintained. No spinal stenosis or nerve root encroachment. L2-3: Preserved disc  height. Mild disc bulging and facet hypertrophy. No spinal stenosis or nerve root encroachment. L3-4: Preserved disc height. Mild disc bulging and bilateral facet hypertrophy. No significant spinal stenosis or nerve root encroachment. L4-5: Mild loss of disc height with mild annular disc bulging eccentric to the left. Moderate bilateral facet hypertrophy. Mild spinal stenosis with mild lateral recess and foraminal narrowing bilaterally. 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 Mild disc uncovering and bulging contributing to mild foraminal narrowing bilaterally. Evidence of previous bilateral laminectomies. IMPRESSION: 1. No acute findings or explanation for the patient's symptoms. 2. Chronic superior endplate compression deformity at  L1. No acute osseous findings. 3. Mild thoracic spondylosis without cord deformity or high-grade spinal stenosis. 4. 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 5. Mild disc bulging and facet hypertrophy at L4-5 contributing to mild spinal stenosis with mild lateral recess and foraminal narrowing bilaterally. Electronically Signed   By: Carey Bullocks M.D.   On: 07/22/2022 11:44   MR Lumbar Spine W Wo Contrast  Result Date: 07/22/2022 CLINICAL DATA:  Chronic mid to low back pain. EXAM: MRI THORACIC AND LUMBAR SPINE WITHOUT AND WITH CONTRAST TECHNIQUE: Multiplanar and multiecho pulse sequences of the thoracic and lumbar spine were obtained without and with intravenous contrast. CONTRAST:  10mL GADAVIST GADOBUTROL 1 MMOL/ML IV SOLN COMPARISON:  CTs of the chest, abdomen and pelvis and thoracic and lumbar spine 07/20/2022. FINDINGS: MRI THORACIC SPINE FINDINGS Alignment: Mildly exaggerated thoracic kyphosis. No focal angulation or listhesis. Vertebrae: No evidence of acute fracture or traumatic subluxation. Minimally heterogeneous marrow signal without suspicious lesion on inversion recovery imaging. No abnormal osseous enhancement. Cord: The thoracic cord appears normal in signal and caliber.No abnormal intradural enhancement. Paraspinal and other soft tissues: No significant paraspinal findings. Trace bilateral pleural effusions. Disc levels: No evidence of discitis or osteomyelitis. Mild multilevel spondylosis with paraspinal osteophytes, partial ankylosing at T9-10. Scattered small disc protrusions, most notable at T6-7 and T7-8. Multilevel facet hypertrophy. No cord deformity or large disc herniation identified. Facet hypertrophy contributes to mild foraminal narrowing at multiple levels. MRI LUMBAR SPINE FINDINGS Segmentation: Conventional anatomy assumed, with the last open disc space designated L5-S1.Concordant with prior  imaging. Alignment: Stable minimal anterolisthesis at L5-S1 secondary to chronic bilateral L5 pars defects. Vertebrae: Chronic superior endplate compression deformity at L1 as seen on recent CT. No associated marrow edema or abnormal enhancement. No acute osseous findings. Chronic bilateral L5 pars defects. Mild sacroiliac degenerative changes bilaterally. Conus medullaris: Extends to the L1-2 level and appears normal. No abnormal intradural enhancement. Paraspinal and other soft tissues: No significant paraspinal findings. Bilateral renal sinus cysts, unchanged from recent CT; no follow-up imaging recommended. Disc levels: T12-L1: Mild disc bulging and facet hypertrophy. No spinal stenosis or nerve root encroachment. L1-2: Disc height and hydration are maintained. No spinal stenosis or nerve root encroachment. L2-3: Preserved disc height. Mild disc bulging and facet hypertrophy. No spinal stenosis or nerve root encroachment. L3-4: Preserved disc height. Mild disc bulging and bilateral facet hypertrophy. No significant spinal stenosis or nerve root encroachment. L4-5: Mild loss of disc height with mild annular disc bulging eccentric to the left. Moderate bilateral facet hypertrophy. Mild spinal stenosis with mild lateral recess and foraminal narrowing bilaterally. 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 Mild disc uncovering and bulging contributing to mild foraminal narrowing bilaterally. Evidence of previous bilateral laminectomies. IMPRESSION: 1. No acute findings or explanation for  the patient's symptoms. 2. Chronic superior endplate compression deformity at L1. No acute osseous findings. 3. Mild thoracic spondylosis without cord deformity or high-grade spinal stenosis. 4. 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 5. Mild disc bulging and facet hypertrophy at L4-5 contributing to mild spinal  stenosis with mild lateral recess and foraminal narrowing bilaterally. Electronically Signed   By: Carey Bullocks M.D.   On: 07/22/2022 11:44   MR BRAIN WO CONTRAST  Result Date: 07/21/2022 CLINICAL DATA:  Stroke suspected EXAM: MRI HEAD WITHOUT CONTRAST TECHNIQUE: Multiplanar, multiecho pulse sequences of the brain and surrounding structures were obtained without intravenous contrast. COMPARISON:  No prior MRI, correlation is made with CT head 07/20/2022 FINDINGS: Brain: No restricted diffusion to suggest acute or subacute infarct. No acute hemorrhage, mass, mass effect, or midline shift. Focus of hemosiderin deposition in the lateral left cerebellum, likely sequela of prior microhemorrhage. No hydrocephalus or extra-axial collection. Scattered T2 hyperintense signal in the periventricular white matter, likely the sequela of mild-to-moderate chronic small vessel ischemic disease. Dilated perivascular spaces in the bilateral basal ganglia. Vascular: Normal arterial flow voids. Skull and upper cervical spine: Hyperostosis frontalis. Otherwise normal marrow signal. Sinuses/Orbits: Clear paranasal sinuses. Status post bilateral lens replacements. Other: Trace fluid in left mastoid air cells. IMPRESSION: No acute intracranial process. No evidence of acute or subacute infarct. Electronically Signed   By: Wiliam Ke M.D.   On: 07/21/2022 03:16   CT ABDOMEN PELVIS W CONTRAST  Result Date: 07/20/2022 CLINICAL DATA:  Pulmonary embolism (PE) suspected, high prob; LLQ abdominal pain EXAM: CT ANGIOGRAPHY CHEST CT ABDOMEN AND PELVIS WITH CONTRAST TECHNIQUE: Multidetector CT imaging of the chest was performed using the standard protocol during bolus administration of intravenous contrast. Multiplanar CT image reconstructions and MIPs were obtained to evaluate the vascular anatomy. Multidetector CT imaging of the abdomen and pelvis was performed using the standard protocol during bolus administration of intravenous  contrast. RADIATION DOSE REDUCTION: This exam was performed according to the departmental dose-optimization program which includes automated exposure control, adjustment of the mA and/or kV according to patient size and/or use of iterative reconstruction technique. CONTRAST:  34mL OMNIPAQUE IOHEXOL 350 MG/ML SOLN COMPARISON:  None Available. FINDINGS: CTA CHEST FINDINGS Cardiovascular: Satisfactory opacification of the pulmonary arteries to the segmental level. No evidence of pulmonary embolism. normal heart size. No significant pericardial effusion. The thoracic aorta is normal in caliber. Mild atherosclerotic plaque of the thoracic aorta. At least 3 vessel coronary artery calcifications. Mediastinum/Nodes: No enlarged mediastinal, hilar, or axillary lymph nodes. Thyroid gland, trachea, and esophagus demonstrate no significant findings. Small volume hiatal hernia. Lungs/Pleura: Low lung volumes. No focal consolidation. 4 mm subpleural micronodule within the right middle lobe (7:41). No pulmonary mass. No pleural effusion. No pneumothorax. Musculoskeletal: No chest wall abnormality. No suspicious lytic or blastic osseous lesions. No acute displaced fracture. Multilevel degenerative changes of the spine. Review of the MIP images confirms the above findings. CT ABDOMEN and PELVIS FINDINGS Hepatobiliary: No focal liver abnormality. Status post cholecystectomy. Common bile duct dilatation which can be seen in the post cholecystectomy setting. Pancreas: Diffusely atrophic. No focal lesion. Otherwise normal pancreatic contour. No surrounding inflammatory changes. No main pancreatic ductal dilatation. Spleen: Normal in size without focal abnormality. Adrenals/Urinary Tract: No adrenal nodule bilaterally. Bilateral kidneys enhance symmetrically. No hydronephrosis. No hydroureter. Bilateral parapelvic fluid density lesions likely representing simple renal cysts. Simple renal cysts, in the absence of clinically indicated  signs/symptoms, require  no independent follow-up. The urinary bladder is unremarkable. Stomach/Bowel: Stomach is within normal limits. No evidence of bowel wall thickening or dilatation. Second portion of the duodenum diverticula. Colonic diverticulosis. The appendix is not definitely identified with no inflammatory changes in the right lower quadrant to suggest acute appendicitis. Vascular/Lymphatic: No abdominal aorta or iliac aneurysm. Moderate to severe atherosclerotic plaque of the aorta and its branches. No abdominal, pelvic, or inguinal lymphadenopathy. Reproductive: Status post hysterectomy. No adnexal masses. Other: Nonspecific slight fat stranding within the left abdomen/pelvis (6:47, 3:78) along the proximal sigmoid colon. No intraperitoneal free fluid. No intraperitoneal free gas. No organized fluid collection. Musculoskeletal: Diastasis rectus. Small shallow supraumbilical ventral wall hernia with a non abdominal defect of 7.5 cm containing couple short loops of small bowel as well as mesenteric fat (3:63, 7:15024). Large infraumbilical ventral hernia containing several loops of small bowel and large bowel as well as mesentery with an abdominal defect of 13 x 13 cm. No suspicious lytic or blastic osseous lesions. No acute displaced fracture. Multilevel degenerative changes of the spine. L1 chronic appearing compression fracture. Review of the MIP images confirms the above findings. IMPRESSION: 1. No pulmonary embolus. 2. Low lung volumes with no acute intrathoracic abnormality. 3. Small hiatal hernia. 4. Colonic diverticulosis. Nonspecific slight fat stranding within the left abdomen/pelvis along the proximal sigmoid colon. No associated bowel wall thickening. Finding may represent resolving or developing mild acute diverticulitis. 5. Supraumbilical and infraumbilical ventral hernias containing bowel with large abdominal defect as well as associated diastasis rectus. No findings suggest associated bowel  obstruction or ischemia. Electronically Signed   By: Tish Frederickson M.D.   On: 07/20/2022 20:12   CT Angio Chest PE W and/or Wo Contrast  Result Date: 07/20/2022 CLINICAL DATA:  Pulmonary embolism (PE) suspected, high prob; LLQ abdominal pain EXAM: CT ANGIOGRAPHY CHEST CT ABDOMEN AND PELVIS WITH CONTRAST TECHNIQUE: Multidetector CT imaging of the chest was performed using the standard protocol during bolus administration of intravenous contrast. Multiplanar CT image reconstructions and MIPs were obtained to evaluate the vascular anatomy. Multidetector CT imaging of the abdomen and pelvis was performed using the standard protocol during bolus administration of intravenous contrast. RADIATION DOSE REDUCTION: This exam was performed according to the departmental dose-optimization program which includes automated exposure control, adjustment of the mA and/or kV according to patient size and/or use of iterative reconstruction technique. CONTRAST:  65mL OMNIPAQUE IOHEXOL 350 MG/ML SOLN COMPARISON:  None Available. FINDINGS: CTA CHEST FINDINGS Cardiovascular: Satisfactory opacification of the pulmonary arteries to the segmental level. No evidence of pulmonary embolism. normal heart size. No significant pericardial effusion. The thoracic aorta is normal in caliber. Mild atherosclerotic plaque of the thoracic aorta. At least 3 vessel coronary artery calcifications. Mediastinum/Nodes: No enlarged mediastinal, hilar, or axillary lymph nodes. Thyroid gland, trachea, and esophagus demonstrate no significant findings. Small volume hiatal hernia. Lungs/Pleura: Low lung volumes. No focal consolidation. 4 mm subpleural micronodule within the right middle lobe (7:41). No pulmonary mass. No pleural effusion. No pneumothorax. Musculoskeletal: No chest wall abnormality. No suspicious lytic or blastic osseous lesions. No acute displaced fracture. Multilevel degenerative changes of the spine. Review of the MIP images confirms the  above findings. CT ABDOMEN and PELVIS FINDINGS Hepatobiliary: No focal liver abnormality. Status post cholecystectomy. Common bile duct dilatation which can be seen in the post cholecystectomy setting. Pancreas: Diffusely atrophic. No focal lesion. Otherwise normal pancreatic contour. No surrounding inflammatory changes. No main pancreatic ductal dilatation. Spleen: Normal in size without focal abnormality. Adrenals/Urinary Tract:  No adrenal nodule bilaterally. Bilateral kidneys enhance symmetrically. No hydronephrosis. No hydroureter. Bilateral parapelvic fluid density lesions likely representing simple renal cysts. Simple renal cysts, in the absence of clinically indicated signs/symptoms, require no independent follow-up. The urinary bladder is unremarkable. Stomach/Bowel: Stomach is within normal limits. No evidence of bowel wall thickening or dilatation. Second portion of the duodenum diverticula. Colonic diverticulosis. The appendix is not definitely identified with no inflammatory changes in the right lower quadrant to suggest acute appendicitis. Vascular/Lymphatic: No abdominal aorta or iliac aneurysm. Moderate to severe atherosclerotic plaque of the aorta and its branches. No abdominal, pelvic, or inguinal lymphadenopathy. Reproductive: Status post hysterectomy. No adnexal masses. Other: Nonspecific slight fat stranding within the left abdomen/pelvis (6:47, 3:78) along the proximal sigmoid colon. No intraperitoneal free fluid. No intraperitoneal free gas. No organized fluid collection. Musculoskeletal: Diastasis rectus. Small shallow supraumbilical ventral wall hernia with a non abdominal defect of 7.5 cm containing couple short loops of small bowel as well as mesenteric fat (3:63, 7:15024). Large infraumbilical ventral hernia containing several loops of small bowel and large bowel as well as mesentery with an abdominal defect of 13 x 13 cm. No suspicious lytic or blastic osseous lesions. No acute displaced  fracture. Multilevel degenerative changes of the spine. L1 chronic appearing compression fracture. Review of the MIP images confirms the above findings. IMPRESSION: 1. No pulmonary embolus. 2. Low lung volumes with no acute intrathoracic abnormality. 3. Small hiatal hernia. 4. Colonic diverticulosis. Nonspecific slight fat stranding within the left abdomen/pelvis along the proximal sigmoid colon. No associated bowel wall thickening. Finding may represent resolving or developing mild acute diverticulitis. 5. Supraumbilical and infraumbilical ventral hernias containing bowel with large abdominal defect as well as associated diastasis rectus. No findings suggest associated bowel obstruction or ischemia. Electronically Signed   By: Tish Frederickson M.D.   On: 07/20/2022 20:12   CT Lumbar Spine Wo Contrast  Result Date: 07/20/2022 CLINICAL DATA:  Found down. EXAM: CT THORACIC AND LUMBAR SPINE WITHOUT CONTRAST TECHNIQUE: Multidetector CT imaging of the thoracic and lumbar spine was performed without contrast. Multiplanar CT image reconstructions were also generated. RADIATION DOSE REDUCTION: This exam was performed according to the departmental dose-optimization program which includes automated exposure control, adjustment of the mA and/or kV according to patient size and/or use of iterative reconstruction technique. COMPARISON:  None Available. FINDINGS: CT THORACIC SPINE FINDINGS Alignment: Normal Vertebrae: No acute fracture or bone lesion. Mild compression deformity T11 appears remote. No posterior rib fractures are identified. Paraspinal and other soft tissues: No significant findings. Moderate-sized hiatal hernia. Dependent bibasilar atelectasis. Coronary artery and aortic calcifications. Disc levels: No large disc protrusions, spinal foraminal stenosis. CT LUMBAR SPINE FINDINGS Segmentation: There are five lumbar type vertebral bodies. The last full intervertebral disc space is labeled L5-S1. Alignment: Normal  Vertebrae: L1 compression fracture appears remote. No acute fracture lines or paraspinal hematoma. No bone lesions. Bilateral pars defects noted at L5 with minimal anterolisthesis. Paraspinal and other soft tissues: Advanced aortic calcifications but no aneurysm. Disc levels: L1-2: No significant findings. L2-3: Advanced facet disease but no spinal or foraminal stenosis. L3-4: Advanced facet disease and mild bulging annulus but no significant spinal or foraminal stenosis. L4-5: Severe facet disease and despite prior laminectomy there is moderately severe spinal and bilateral lateral recess stenosis. L5-S1: Bulging uncovered disc but generous spinal canal. Wide decompressive laminectomy. No spinal or foraminal stenosis. IMPRESSION: 1. No acute thoracic or lumbar spine fracture. Remote appearing T11 and L1 fractures. 2. Postoperative changes in the  lower lumbar spine with laminectomy defects at L4-5 and L5-S1. 3. 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 4. Bilateral pars defects at L5. 5. Aortic atherosclerosis. Aortic Atherosclerosis (ICD10-I70.0). Electronically Signed   By: Rudie Meyer M.D.   On: 07/20/2022 17:04   CT Thoracic Spine Wo Contrast  Result Date: 07/20/2022 CLINICAL DATA:  Found down. EXAM: CT THORACIC AND LUMBAR SPINE WITHOUT CONTRAST TECHNIQUE: Multidetector CT imaging of the thoracic and lumbar spine was performed without contrast. Multiplanar CT image reconstructions were also generated. RADIATION DOSE REDUCTION: This exam was performed according to the departmental dose-optimization program which includes automated exposure control, adjustment of the mA and/or kV according to patient size and/or use of iterative reconstruction technique. COMPARISON:  None Available. FINDINGS: CT THORACIC SPINE FINDINGS Alignment: Normal Vertebrae: No acute fracture or bone lesion. Mild compression deformity T11 appears remote. No posterior rib fractures are identified.  Paraspinal and other soft tissues: No significant findings. Moderate-sized hiatal hernia. Dependent bibasilar atelectasis. Coronary artery and aortic calcifications. Disc levels: No large disc protrusions, spinal foraminal stenosis. CT LUMBAR SPINE FINDINGS Segmentation: There are five lumbar type vertebral bodies. The last full intervertebral disc space is labeled L5-S1. Alignment: Normal Vertebrae: L1 compression fracture appears remote. No acute fracture lines or paraspinal hematoma. No bone lesions. Bilateral pars defects noted at L5 with minimal anterolisthesis. Paraspinal and other soft tissues: Advanced aortic calcifications but no aneurysm. Disc levels: L1-2: No significant findings. L2-3: Advanced facet disease but no spinal or foraminal stenosis. L3-4: Advanced facet disease and mild bulging annulus but no significant spinal or foraminal stenosis. L4-5: Severe facet disease and despite prior laminectomy there is moderately severe spinal and bilateral lateral recess stenosis. L5-S1: Bulging uncovered disc but generous spinal canal. Wide decompressive laminectomy. No spinal or foraminal stenosis. IMPRESSION: 1. No acute thoracic or lumbar spine fracture. Remote appearing T11 and L1 fractures. 2. Postoperative changes in the lower lumbar spine with laminectomy defects at L4-5 and L5-S1. 3. 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 4. Bilateral pars defects at L5. 5. Aortic atherosclerosis. Aortic Atherosclerosis (ICD10-I70.0). Electronically Signed   By: Rudie Meyer M.D.   On: 07/20/2022 17:04   CT Head Wo Contrast  Result Date: 07/20/2022 CLINICAL DATA:  71 year old female with fall. EXAM: CT HEAD WITHOUT CONTRAST CT CERVICAL SPINE WITHOUT CONTRAST TECHNIQUE: Multidetector CT imaging of the head and cervical spine was performed following the standard protocol without intravenous contrast. Multiplanar CT image reconstructions of the cervical spine were also generated.  RADIATION DOSE REDUCTION: This exam was performed according to the departmental dose-optimization program which includes automated exposure control, adjustment of the mA and/or kV according to patient size and/or use of iterative reconstruction technique. COMPARISON:  None Available. FINDINGS: CT HEAD FINDINGS Brain: Age indeterminate infarct versus chronic ischemic changes in the RIGHT basal ganglia noted. No other infarct, hemorrhage, mass lesion or mass effect, hydrocephalus or extra-axial collection noted. Mild atrophy and chronic small-vessel white matter ischemic changes noted. Vascular: Carotid and vertebral atherosclerotic calcifications are noted. Skull: Normal. Negative for fracture or focal lesion. Sinuses/Orbits: No acute finding. Other: None. CT CERVICAL SPINE FINDINGS Alignment: Normal. Skull base and vertebrae: No acute fracture. No primary bone lesion or focal pathologic process. Soft tissues and spinal canal: No prevertebral fluid or swelling. No visible canal hematoma. Disc levels: Mild multilevel facet arthropathy noted contributing to mild bony foraminal narrowing throughout the cervical spine. Disc spaces are relatively maintained. Upper chest: No acute abnormality Other: None  IMPRESSION: 1. Age indeterminate infarct versus chronic ischemic changes in the RIGHT basal ganglia. No other evidence of acute intracranial abnormality. No evidence of hemorrhage. 2. No evidence of acute injury to the cervical spine. 3. Mild atrophy and chronic small-vessel white matter ischemic changes. Electronically Signed   By: Harmon Pier M.D.   On: 07/20/2022 16:13   CT Cervical Spine Wo Contrast  Result Date: 07/20/2022 CLINICAL DATA:  71 year old female with fall. EXAM: CT HEAD WITHOUT CONTRAST CT CERVICAL SPINE WITHOUT CONTRAST TECHNIQUE: Multidetector CT imaging of the head and cervical spine was performed following the standard protocol without intravenous contrast. Multiplanar CT image reconstructions of  the cervical spine were also generated. RADIATION DOSE REDUCTION: This exam was performed according to the departmental dose-optimization program which includes automated exposure control, adjustment of the mA and/or kV according to patient size and/or use of iterative reconstruction technique. COMPARISON:  None Available. FINDINGS: CT HEAD FINDINGS Brain: Age indeterminate infarct versus chronic ischemic changes in the RIGHT basal ganglia noted. No other infarct, hemorrhage, mass lesion or mass effect, hydrocephalus or extra-axial collection noted. Mild atrophy and chronic small-vessel white matter ischemic changes noted. Vascular: Carotid and vertebral atherosclerotic calcifications are noted. Skull: Normal. Negative for fracture or focal lesion. Sinuses/Orbits: No acute finding. Other: None. CT CERVICAL SPINE FINDINGS Alignment: Normal. Skull base and vertebrae: No acute fracture. No primary bone lesion or focal pathologic process. Soft tissues and spinal canal: No prevertebral fluid or swelling. No visible canal hematoma. Disc levels: Mild multilevel facet arthropathy noted contributing to mild bony foraminal narrowing throughout the cervical spine. Disc spaces are relatively maintained. Upper chest: No acute abnormality Other: None IMPRESSION: 1. Age indeterminate infarct versus chronic ischemic changes in the RIGHT basal ganglia. No other evidence of acute intracranial abnormality. No evidence of hemorrhage. 2. No evidence of acute injury to the cervical spine. 3. Mild atrophy and chronic small-vessel white matter ischemic changes. Electronically Signed   By: Harmon Pier M.D.   On: 07/20/2022 16:13   DG Chest 2 View  Result Date: 07/20/2022 CLINICAL DATA:  Fall, left rib pain EXAM: CHEST - 2 VIEW COMPARISON:  06/17/2019 FINDINGS: Low volume portable examination. Cardiomegaly. Mild, diffuse bilateral interstitial pulmonary opacity and bronchovascular crowding. No obvious displaced rib fracture. IMPRESSION:  1. Low volume portable examination. 2. Cardiomegaly. 3. Mild, diffuse bilateral interstitial pulmonary opacity and bronchovascular crowding, likely edema. 4. No obvious displaced rib fracture or pneumothorax. CT may be helpful to further evaluate if fracture is suspected. Electronically Signed   By: Jearld Lesch M.D.   On: 07/20/2022 15:17   DG Elbow Complete Left  Result Date: 07/20/2022 CLINICAL DATA:  Pain after fall EXAM: LEFT ELBOW - COMPLETE 3+ VIEW COMPARISON:  None Available. FINDINGS: No acute fracture or dislocation in the left elbow. Mild degenerative arthritis. No elbow joint effusion. No significant soft tissue abnormality. IMPRESSION: No acute fracture or dislocation. Electronically Signed   By: Minerva Fester M.D.   On: 07/20/2022 15:16   DG Shoulder Left  Result Date: 07/20/2022 CLINICAL DATA:  Fall.  Pain. EXAM: LEFT SHOULDER - 2+ VIEW COMPARISON:  None Available. FINDINGS: There is no evidence of fracture or dislocation. There is no evidence of arthropathy or other focal bone abnormality. Soft tissues are unremarkable. IMPRESSION: Negative. Electronically Signed   By: Kennith Center M.D.   On: 07/20/2022 15:15   DG Tibia/Fibula Left  Result Date: 07/20/2022 CLINICAL DATA:  Fall.  Pain. EXAM: LEFT TIBIA AND FIBULA - 2 VIEW COMPARISON:  None Available. FINDINGS: There is no evidence of fracture or other focal bone lesions. Soft tissues are unremarkable. IMPRESSION: Negative. Electronically Signed   By: Kennith Center M.D.   On: 07/20/2022 15:15   DG Hip Unilat W or Wo Pelvis 2-3 Views Left  Result Date: 07/20/2022 CLINICAL DATA:  Hip pain after fall EXAM: DG HIP (WITH OR WITHOUT PELVIS) 2-3V LEFT COMPARISON:  None Available. FINDINGS: No acute fracture or dislocation in the pelvis. Degenerative changes pubic symphysis, both hips, SI joints and lower lumbar spine. IMPRESSION: No acute fracture or dislocation. Electronically Signed   By: Minerva Fester M.D.   On: 07/20/2022 15:14    (Echo, Carotid, EGD, Colonoscopy, ERCP)    Subjective: No complaints bowel movements have slowed down.  Discharge Exam: Vitals:   07/25/22 0444 07/25/22 0853  BP: 123/71 134/68  Pulse: (!) 103 (!) 103  Resp: 17 16  Temp: 97.7 F (36.5 C) 98.6 F (37 C)  SpO2: 98% 94%   Vitals:   07/24/22 0938 07/24/22 2026 07/25/22 0444 07/25/22 0853  BP:  (!) 133/94 123/71 134/68  Pulse: (!) 110 (!) 102 (!) 103 (!) 103  Resp:  17 17 16   Temp:  98.5 F (36.9 C) 97.7 F (36.5 C) 98.6 F (37 C)  TempSrc:  Oral Oral Oral  SpO2:  94% 98% 94%  Weight:      Height:        General: Pt is alert, awake, not in acute distress Cardiovascular: RRR, S1/S2 +, no rubs, no gallops Respiratory: CTA bilaterally, no wheezing, no rhonchi Abdominal: Soft, NT, ND, bowel sounds + Extremities: no edema, no cyanosis    The results of significant diagnostics from this hospitalization (including imaging, microbiology, ancillary and laboratory) are listed below for reference.     Microbiology: No results found for this or any previous visit (from the past 240 hour(s)).   Labs: BNP (last 3 results) Recent Labs    07/20/22 1340  BNP 47.6   Basic Metabolic Panel: Recent Labs  Lab 07/20/22 1318 07/20/22 1334 07/21/22 0600 07/22/22 0252 07/23/22 0834 07/24/22 0424  NA 137 136 137 138 139 137  K 4.5 4.4 4.3 3.8 3.4* 3.6  CL 99 102 105 107 106 105  CO2 20*  --  19* 23 22 23   GLUCOSE 132* 132* 68* 110* 122* 121*  BUN 12 15 12 11  6* 8  CREATININE 1.51* 1.30* 1.08* 1.01* 0.89 0.98  CALCIUM 9.6  --  8.4* 8.5* 8.9 8.7*  MG  --   --   --   --  1.5*  --    Liver Function Tests: Recent Labs  Lab 07/20/22 1318 07/21/22 0600 07/22/22 0252 07/23/22 0834 07/24/22 0424  AST 62* 34 39 36 31  ALT 26 16 16 21 19   ALKPHOS 74 49 55 69 59  BILITOT 2.5* 1.6* 1.6* 0.5 0.8  PROT 6.4* 4.3* 5.1* 6.3* 5.6*  ALBUMIN 2.7* 1.6* 1.9* 2.5* 2.1*   No results for input(s): "LIPASE", "AMYLASE" in the last 168  hours. No results for input(s): "AMMONIA" in the last 168 hours. CBC: Recent Labs  Lab 07/20/22 1318 07/20/22 1334 07/21/22 0515 07/22/22 0252  WBC 11.9*  --  9.9 6.1  NEUTROABS 9.9*  --   --   --   HGB 15.0 16.3* 13.6 11.7*  HCT 46.0 48.0* 44.0 36.7  MCV 81.7  --  85.6 83.2  PLT 280  --  258 262   Cardiac Enzymes: Recent Labs  Lab 07/20/22 1318 07/21/22 0600  CKTOTAL 1,651* 477*   BNP: Invalid input(s): "POCBNP" CBG: Recent Labs  Lab 07/24/22 0735 07/24/22 1129 07/24/22 1639 07/24/22 2029 07/25/22 0856  GLUCAP 114* 132* 124* 131* 120*   D-Dimer No results for input(s): "DDIMER" in the last 72 hours. Hgb A1c Recent Labs    07/23/22 0834  HGBA1C 7.2*   Lipid Profile No results for input(s): "CHOL", "HDL", "LDLCALC", "TRIG", "CHOLHDL", "LDLDIRECT" in the last 72 hours. Thyroid function studies No results for input(s): "TSH", "T4TOTAL", "T3FREE", "THYROIDAB" in the last 72 hours.  Invalid input(s): "FREET3" Anemia work up No results for input(s): "VITAMINB12", "FOLATE", "FERRITIN", "TIBC", "IRON", "RETICCTPCT" in the last 72 hours. Urinalysis    Component Value Date/Time   COLORURINE AMBER (A) 07/20/2022 2221   APPEARANCEUR HAZY (A) 07/20/2022 2221   LABSPEC 1.023 07/20/2022 2221   PHURINE 5.0 07/20/2022 2221   GLUCOSEU NEGATIVE 07/20/2022 2221   HGBUR SMALL (A) 07/20/2022 2221   BILIRUBINUR NEGATIVE 07/20/2022 2221   KETONESUR NEGATIVE 07/20/2022 2221   PROTEINUR 30 (A) 07/20/2022 2221   NITRITE NEGATIVE 07/20/2022 2221   LEUKOCYTESUR NEGATIVE 07/20/2022 2221   Sepsis Labs Recent Labs  Lab 07/20/22 1318 07/21/22 0515 07/22/22 0252  WBC 11.9* 9.9 6.1   Microbiology No results found for this or any previous visit (from the past 240 hour(s)).   SIGNED:   Marinda Elk, MD  [facility] 07/25/2022, 10:22 AM Pager   If 7PM-7AM, please contact night-coverage [email or web address] Password [email or web address]

## 2022-07-25 NOTE — Progress Notes (Signed)
Mary Ferguson to be D/C'd  per MD order.  Discussed with the patient and all questions fully answered.  VSS, Skin clean, dry and intact without evidence of skin break down, no evidence of skin tears noted.  IV catheter discontinued intact. Site without signs and symptoms of complications. Dressing and pressure applied.  An After Visit Summary was printed and given to the patient. Prescription and Physical therapy refer given to patient.  D/c re-educate completed with patient/family including follow up instructions, medication list, d/c activities limitations if indicated, with other d/c instructions as indicated by MD - patient able to verbalize understanding, all questions fully answered.   Patient instructed to return to ED, call 911, or call MD for any changes in condition.   Patient to be escorted via WC, and D/C home via private auto.

## 2022-07-26 ENCOUNTER — Telehealth: Payer: Self-pay

## 2022-07-26 NOTE — Patient Outreach (Signed)
  Care Coordination TOC Note Transition Care Management Unsuccessful Follow-up Telephone Call  Date of discharge and from where:  07/25/22-[place]  Attempts:  1st Attempt  Reason for unsuccessful TCM follow-up call:  No answer/busy    Neven Fina, RN,BSN,CCM [facility] Care Management Telephonic Care Management Coordinator Direct Phone: [phone] Toll Free: [phone] Fax: [phone]   

## 2022-07-29 ENCOUNTER — Telehealth: Payer: Self-pay

## 2022-07-29 NOTE — Patient Outreach (Signed)
  Care Coordination TOC Note Transition Care Management Unsuccessful Follow-up Telephone Call  Date of discharge and from where:  07/25/22-[place]  Attempts:  3rd Attempt  Reason for unsuccessful TCM follow-up call:  Unable to reach patient     Alessandra Grout [facility] Care Management Telephonic Care Management Coordinator Direct Phone: [phone] Toll Free: [phone] Fax: [phone]

## 2022-07-29 NOTE — Patient Outreach (Signed)
  Care Coordination TOC Note Transition Care Management Unsuccessful Follow-up Telephone Call  Date of discharge and from where:  07/25/2022-[place]   Attempts:  2nd Attempt  Reason for unsuccessful TCM follow-up call:  Left voice message    Alessandra Grout [facility] Care Management Telephonic Care Management Coordinator Direct Phone: [phone] Toll Free: [phone] Fax: [phone]

## 2022-07-30 NOTE — Therapy (Signed)
OUTPATIENT PHYSICAL THERAPY LOWER EXTREMITY EVALUATION   Patient Name: Mary Ferguson MRN: [id number] DOB:03-27-1951, 71 y.o., female Today's Date: 08/01/2022  END OF SESSION:  PT End of Session - 08/01/22 0848     Visit Number 1    Date for PT Re-Evaluation 09/26/22    Authorization Type Medicare and BCBS Supplement    PT Start Time 0848    PT Stop Time 0934    PT Time Calculation (min) 46 min    Activity Tolerance Patient limited by fatigue;Patient limited by pain    Behavior During Therapy [facility] for tasks assessed/performed   tearful during examination            Past Medical History:  Diagnosis Date   Acute and chronic respiratory failure with hypoxia ([place]) 01/24/2018   Asthma    Blood in stool    Chicken pox    Chronic generalized abdominal pain    Chronic pain    CKD (chronic kidney disease), stage III (HCC)    Colon polyps    COPD (chronic obstructive pulmonary disease) ([place])    COVID-19 2020   Depression    Diverticulitis    Very severe.  Has had 2 bowel resections and temporary colostomy in the past.   Fecal incontinence    Frequent headaches    GERD (gastroesophageal reflux disease)    Heart attack ([place])    Heart disease    History of blood transfusion    Hyperlipemia    Hypertension    Incontinence of feces 12/11/2016   Morbid obesity ([place])    Pneumonia due to infectious organism 01/12/2018   Postmenopausal 03/21/2021   PTSD (post-traumatic stress disorder)    Recurrent deep vein thrombosis (DVT) ([place])    x6   Urinary incontinence    Past Surgical History:  Procedure Laterality Date   ABDOMINAL HYSTERECTOMY  1995   APPENDECTOMY     CATARACT EXTRACTION Bilateral    CESAREAN SECTION  1984   CHOLECYSTECTOMY  2016   COLOSTOMY     and reversal   CORONARY STENT INTERVENTION N/A 06/18/2019   Procedure: CORONARY STENT INTERVENTION;  Surgeon: Wellington Hampshire, MD;  Location: [place] [facility];  Service: Cardiovascular;  Laterality: N/A;   [place] and 2016 (2016 ventral w/ incarceration) 33 surgeries   HYSTEROSCOPY WITH D & C     13   LAPAROSCOPIC LYSIS OF ADHESIONS     x2   LEFT HEART CATH AND CORONARY ANGIOGRAPHY N/A 06/18/2019   Procedure: LEFT HEART CATH AND CORONARY ANGIOGRAPHY;  Surgeon: Wellington Hampshire, MD;  Location: [place] [facility];  Service: Cardiovascular;  Laterality: N/A;   LEFT HEART CATH AND CORONARY ANGIOGRAPHY N/A 11/30/2019   Procedure: LEFT HEART CATH AND CORONARY ANGIOGRAPHY;  Surgeon: Troy Sine, MD;  Location: [place] [facility];  Service: Cardiovascular;  Laterality: N/A;   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   TONSILLECTOMY  1959   VEIN SURGERY     laser x2   Patient Active Problem List   Diagnosis Date Noted   Rhabdomyolysis 07/21/2022   Acute kidney injury superimposed on chronic kidney disease ([place]) 07/21/2022   Fall at home, initial encounter 07/21/2022   High anion gap metabolic acidosis [id number]   Elevated liver enzymes 07/21/2022   Acute diverticulitis 07/20/2022   Chronic venous insufficiency 01/08/2022   Irritable bowel syndrome with diarrhea 06/21/2021   Imdur therapy-Coronary  artery disease with angina pectoris ([place]) 03/22/2021   Compression fracture of L1 lumbar vertebra (HCC) 03/22/2021   Lumbar radiculopathy 03/22/2021   Neuropathy 03/22/2021   Paresthesia of bilateral legs 03/22/2021   Aortic atherosclerosis ([place]) 03/22/2021   Type II diabetes mellitus with manifestations ([place]) 03/21/2021   COPD (chronic obstructive pulmonary disease) ([place]) 03/21/2021   CAD S/P PCI 06/30/2019   Dyslipidemia, goal LDL below 70 06/30/2019   Morbid obesity ([place])    Essential hypertension    History of non-ST elevation myocardial infarction (NSTEMI) 06/17/2019   Acute on chronic respiratory failure with hypoxemia ([place]) 01/24/2018   Mild episode of recurrent major depressive disorder ([place]) 03/25/2017   CKD (chronic kidney disease) stage  3, GFR 30-59 ml/min (HCC) 03/19/2017   Vitamin D deficiency 03/19/2017   B12 deficiency 03/19/2017   Nocturnal oxygen desaturation 12/11/2016   BMI 45.0-49.9, adult ([place]) 12/11/2016   Ambulates with cane 12/11/2016   Chronic pruritic rash in adult 07/08/2016   Chronic pain syndrome 03/13/2015   Abdominal wall hernia 01/27/2014   Obstructive sleep apnea (adult) (pediatric) 08/05/2013   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 2023  NEXT MD VISIT: 08/07/22 with PCP   SUBJECTIVE:   SUBJECTIVE STATEMENT: Pt states she fell on Thanksgiving at home and stayed in the floor until her family found her days later. States that she didn't hurt anything but she was dehydrated. She is not confident at all in her balance and ability to be safe outside of her home and even within her home. States that she is until to do anything at home like carrying objects, bending over, and looking down. Pt states that she is occasionally dizzy when changing positions and movements of her head.  PERTINENT HISTORY: Asthma, Stage III CKD, COPD, Depression, GERD, Heart Disease (Coronary Stent, Ablation, Heart Cath), Lumbar radiculopathy, Obesity, Type II DM, Recurrent DVT   PAIN:  Are you having pain? Yes: NPRS scale: 7-8/10 Pain location: Low back, hips, knees, and feet Pain description: stabbing, stinging Aggravating factors: everything Relieving factors: ibuprofen as needed   PRECAUTIONS: Fall  WEIGHT BEARING RESTRICTIONS: No  FALLS:  Has  patient fallen in last 6 months? Yes. Number of falls 4-6  LIVING ENVIRONMENT: Lives with: lives with their son and daughter-in-law Lives in: House/apartment Stairs: Yes: External: uses a ramp that was built steps; none Has following equipment at home: Single point cane, Environmental consultant - 4 wheeled, Electronics engineer, Grab bars, and Ramped entry  OCCUPATION: does not work  PLOF: Needs assistance with ADLs and Needs assistance with transfers  PATIENT GOALS: "improve balance and stability"   OBJECTIVE:   DIAGNOSTIC FINDINGS:  07/22/22 MR Thoracic Spine w/wo Contrast IMPRESSION: 1. No acute findings or explanation for the patient's symptoms. 2. Chronic superior endplate compression deformity at L1. No acute osseous findings. 3. Mild thoracic spondylosis without cord deformity or high-grade spinal stenosis. 4. 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 5. Mild disc bulging and facet hypertrophy at L4-5 contributing to mild spinal stenosis with mild lateral  recess and foraminal narrowing bilaterally.  07/22/22 MR Lumbar Spine w/wo Contrast IMPRESSION: 1. No acute findings or explanation for the patient's symptoms. 2. Chronic superior endplate compression deformity at L1. No acute osseous findings. 3. Mild thoracic spondylosis without cord deformity or high-grade spinal stenosis. 4. 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 5. Mild disc bulging and facet hypertrophy at L4-5 contributing to mild spinal stenosis with mild lateral recess and foraminal narrowing bilaterally.  PATIENT SURVEYS:  ABC scale 60 / 1600 = 3.8 %  COGNITION: Overall cognitive status: Impaired and increased response time and delay with answer questions, occasional stuttering      SENSATION: Light touch: Impaired  Proprioception: Impaired   EDEMA:  NT  MUSCLE  LENGTH: NT  POSTURE: rounded shoulders, forward head, and increased thoracic kyphosis  PALPATION: NT  LOWER EXTREMITY ROM:  Grossly limited by general observation  LOWER EXTREMITY MMT: MMT increased back pain  MMT Right eval Left eval  Hip flexion 3 2+  Hip extension    Hip abduction 4-* 4-*  Hip adduction 4-* 4-*  Hip internal rotation 3+ 3+  Hip external rotation 3+ 3+  Knee flexion 4- 4-  Knee extension 4- 4-  Ankle dorsiflexion 4- 4-  Ankle plantarflexion    Ankle inversion    Ankle eversion     (Blank rows = not tested, *=modified to sitting position for testing)  LOWER EXTREMITY SPECIAL TESTS:  NT  FUNCTIONAL TESTS: *to be assessed on next visit* 5 times sit to stand: NT Berg Balance Scale: NT Gait speed :NT  GAIT: Distance walked: 90 Assistive device utilized: Environmental consultant - 4 wheeled Level of assistance: Modified independence Comments: decreased BLE, significantly decreased gait speed, decreased stride/step length BLE, forward flexed posture   TODAY'S TREATMENT:                                                                                                                              DATE:   08/01/22 Initial eval Education and demonstration on safe use of Rollator during transfers and walking Demonstrated Sit<>stand x2 with safe techniques- cues to lock brakes, stand up use chair, and close proximity to rollator.  Safety adjustments were made by therapist on rollator with brake cables and increased height of rollator   PATIENT EDUCATION:  Education details: PT eval findings, anticipated POC, need for further assessment of balance and mobility, transfer safety, and gait safety with rollator   Person educated: Patient Education method: Explanation, Demonstration, Tactile cues, and Verbal cues Education comprehension: verbalized understanding, returned demonstration, verbal cues required, tactile cues required, and needs further education  HOME EXERCISE  PROGRAM:   ASSESSMENT:  CLINICAL IMPRESSION: Mary Ferguson is a 71 y.o. female who was seen today for physical therapy evaluation and treatment for decreased balance after hospital stay and episode of rhabdomyolysis. She presents today with hx of frequent falls in her home, decreased confidence in  her ability to move around her home/community, and decreased strength from recent hospital stay. Mary Ferguson is concerned for her safety at home and wants to improve stability for ADLs. Deficits include decreased BLE strength, impaired sensation/proprioception in BLE, decreased speed and foot clearance during gait, and decreased activity tolerance/endurance. She presents with some cognitive delay when responding to questions and needs increased time and repetition to understand the task asked to perform. Next session will continue assessment of balance and mobility which will include Berg, 5xSTS, and gait speed as tolerated;  continued education on safe ambulation and home set up are needed. She will benefit from skilled therapy of TA to address functional deficits with performing ADLs, TE to address muscle performance deficits of BLE, NMR to address to static and dynamic standing balance deficits, and gait training to address foot clearance and endurance deficits.   OBJECTIVE IMPAIRMENTS: Abnormal gait, cardiopulmonary status limiting activity, decreased activity tolerance, decreased balance, decreased cognition, decreased coordination, decreased endurance, decreased knowledge of condition, decreased knowledge of use of DME, decreased mobility, difficulty walking, decreased ROM, decreased strength, decreased safety awareness, dizziness, hypomobility, increased edema, increased fascial restrictions, impaired perceived functional ability, impaired flexibility, impaired sensation, impaired tone, postural dysfunction, and pain.   ACTIVITY LIMITATIONS: carrying, lifting, bending, sitting, standing, squatting, stairs, transfers, bed  mobility, bathing, toileting, dressing, and locomotion level  PARTICIPATION LIMITATIONS: meal prep, cleaning, laundry, medication management, shopping, and community activity  PERSONAL FACTORS: Age, Education, Past/current experiences, Time since onset of injury/illness/exacerbation, and 3+ comorbidities: Asthma, Stage III CKD, COPD, Depression, GERD, Heart Disease (Coronary Stent, Ablation, Heart Cath), Lumbar radiculopathy, Obesity, Type II DM, Recurrent DVT  are also affecting patient's functional outcome.   REHAB POTENTIAL: Good  CLINICAL DECISION MAKING: Evolving/moderate complexity  EVALUATION COMPLEXITY: Moderate   GOALS: Goals reviewed with patient? Yes  SHORT TERM GOALS: Target date: 08/29/22 Pt will be independent with initial HEP.  Baseline: Goal status: INITIAL  2.  Pt will demonstrate safe techniques during transfers and gait using the rollator without needed cueing.  Baseline:  Goal status: INITIAL  LONG TERM GOALS: Target date: 09/26/22  Pt will be independent with advanced/ongoing HEP to improve outcomes/carryover.  Baseline:  Goal status: INITIAL  2.  Pt will report at least 22% balance confidence on ABC scale to demonstrate improved confidence with functional mobility.   Baseline: 60 / 1600 = 3.8 % (MDIC 19%) Goal status: INITIAL  3.  Pt will improve Berg Balance Score by 8 points to decrease risk of falls.  Baseline:  Goal status: INITIAL  4.  Pt will demonstrate consistent safe ambulation and transfers techniques using Rollator to improve safety around the home and in the community.  Baseline:  Goal status: INITIAL  5.  Pt will amb >300 feet with normal gait pattern and LRAD to complete daily tasks around the home with increased endurance.  Baseline:  Goal status: INITIAL  6.  Pt will improve 5x STS time to </= 20 seconds to demonstrate improved functional strength and transfer efficiency.  Baseline:  Goal status: INITIAL  7.  Pt will improve gait  speed to <1.3 ft/sec to improve household ambulation with decreased fall risk.  Baseline:  Goal status: INITIAL  8.  Pt will report 50% improvement in low back and LE pain to improve QOL.  Baseline:  Goal status: INITIAL    PLAN:  PT FREQUENCY: 2x/week  PT DURATION: 8 weeks  PLANNED INTERVENTIONS: Therapeutic exercises, Therapeutic activity, Neuromuscular re-education, Balance training, Gait training, Patient/Family education, Self  Care, Joint mobilization, DME instructions, Aquatic Therapy, Dry Needling, Electrical stimulation, Cryotherapy, Moist heat, Compression bandaging, Vasopneumatic device, Ultrasound, Ionotophoresis 4mg /ml Dexamethasone, Manual therapy, and Re-evaluation  PLAN FOR NEXT SESSION: follow up about use of O2 at home, vital sign check throughout session, BERG, 5xSTS, handout on safety in the home/DME, initial HEP, static and dynamic balance intervention, general LE ROM/strengthening exercises   Zeb Comfort, Student-PT 08/01/2022, 10:05 AM

## 2022-08-01 ENCOUNTER — Other Ambulatory Visit: Payer: Self-pay

## 2022-08-01 ENCOUNTER — Ambulatory Visit: Payer: Medicare Other | Attending: Internal Medicine | Admitting: Physical Therapy

## 2022-08-01 ENCOUNTER — Encounter: Payer: Self-pay | Admitting: Physical Therapy

## 2022-08-01 DIAGNOSIS — M79604 Pain in right leg: Secondary | ICD-10-CM | POA: Insufficient documentation

## 2022-08-01 DIAGNOSIS — M6281 Muscle weakness (generalized): Secondary | ICD-10-CM | POA: Diagnosis present

## 2022-08-01 DIAGNOSIS — R2689 Other abnormalities of gait and mobility: Secondary | ICD-10-CM | POA: Insufficient documentation

## 2022-08-01 DIAGNOSIS — R2681 Unsteadiness on feet: Secondary | ICD-10-CM | POA: Diagnosis present

## 2022-08-01 DIAGNOSIS — M79605 Pain in left leg: Secondary | ICD-10-CM | POA: Insufficient documentation

## 2022-08-02 ENCOUNTER — Ambulatory Visit: Payer: Medicare Other | Admitting: Family Medicine

## 2022-08-07 ENCOUNTER — Encounter: Payer: Self-pay | Admitting: Family Medicine

## 2022-08-07 ENCOUNTER — Ambulatory Visit (INDEPENDENT_AMBULATORY_CARE_PROVIDER_SITE_OTHER): Payer: Medicare Other | Admitting: Family Medicine

## 2022-08-07 ENCOUNTER — Telehealth: Payer: Self-pay

## 2022-08-07 VITALS — BP 92/64 | HR 96 | Temp 98.1°F | Wt 241.0 lb

## 2022-08-07 DIAGNOSIS — Z23 Encounter for immunization: Secondary | ICD-10-CM

## 2022-08-07 DIAGNOSIS — T796XXD Traumatic ischemia of muscle, subsequent encounter: Secondary | ICD-10-CM | POA: Diagnosis not present

## 2022-08-07 DIAGNOSIS — K5792 Diverticulitis of intestine, part unspecified, without perforation or abscess without bleeding: Secondary | ICD-10-CM | POA: Diagnosis not present

## 2022-08-07 DIAGNOSIS — J42 Unspecified chronic bronchitis: Secondary | ICD-10-CM

## 2022-08-07 DIAGNOSIS — W19XXXD Unspecified fall, subsequent encounter: Secondary | ICD-10-CM

## 2022-08-07 DIAGNOSIS — I959 Hypotension, unspecified: Secondary | ICD-10-CM

## 2022-08-07 LAB — CBC WITH DIFFERENTIAL/PLATELET
Basophils Absolute: 0.1 10*3/uL (ref 0.0–0.1)
Basophils Relative: 0.8 % (ref 0.0–3.0)
Eosinophils Absolute: 0.2 10*3/uL (ref 0.0–0.7)
Eosinophils Relative: 2.8 % (ref 0.0–5.0)
HCT: 42.2 % (ref 36.0–46.0)
Hemoglobin: 13.7 g/dL (ref 12.0–15.0)
Lymphocytes Relative: 11.8 % — ABNORMAL LOW (ref 12.0–46.0)
Lymphs Abs: 1 10*3/uL (ref 0.7–4.0)
MCHC: 32.4 g/dL (ref 30.0–36.0)
MCV: 82.5 fl (ref 78.0–100.0)
Monocytes Absolute: 0.6 10*3/uL (ref 0.1–1.0)
Monocytes Relative: 7.8 % (ref 3.0–12.0)
Neutro Abs: 6.3 10*3/uL (ref 1.4–7.7)
Neutrophils Relative %: 76.8 % (ref 43.0–77.0)
Platelets: 376 10*3/uL (ref 150.0–400.0)
RBC: 5.11 Mil/uL (ref 3.87–5.11)
RDW: 19.3 % — ABNORMAL HIGH (ref 11.5–15.5)
WBC: 8.3 10*3/uL (ref 4.0–10.5)

## 2022-08-07 LAB — COMPREHENSIVE METABOLIC PANEL
ALT: 15 U/L (ref 0–35)
AST: 19 U/L (ref 0–37)
Albumin: 3.9 g/dL (ref 3.5–5.2)
Alkaline Phosphatase: 76 U/L (ref 39–117)
BUN: 16 mg/dL (ref 6–23)
CO2: 25 mEq/L (ref 19–32)
Calcium: 10 mg/dL (ref 8.4–10.5)
Chloride: 103 mEq/L (ref 96–112)
Creatinine, Ser: 1.27 mg/dL — ABNORMAL HIGH (ref 0.40–1.20)
GFR: 42.76 mL/min — ABNORMAL LOW (ref >60.00)
Glucose, Bld: 117 mg/dL — ABNORMAL HIGH (ref 70–99)
Potassium: 4.2 mEq/L (ref 3.5–5.1)
Sodium: 139 mEq/L (ref 135–145)
Total Bilirubin: 0.8 mg/dL (ref 0.2–1.2)
Total Protein: 6.5 g/dL (ref 6.0–8.3)

## 2022-08-07 MED ORDER — METFORMIN HCL 500 MG PO TABS: 500.0000 mg | ORAL_TABLET | Freq: Two times a day (BID) | ORAL | 5 refills | Status: DC

## 2022-08-07 MED ORDER — FLUTICASONE FUROATE-VILANTEROL 100-25 MCG/ACT IN AEPB: 1.0000 | INHALATION_SPRAY | Freq: Every day | RESPIRATORY_TRACT | 5 refills | Status: AC

## 2022-08-07 MED ORDER — TRUEPLUS LANCETS 33G MISC: 1.0000 | Freq: Every day | 5 refills | Status: DC

## 2022-08-07 MED ORDER — IPRATROPIUM-ALBUTEROL 0.5-2.5 (3) MG/3ML IN SOLN: 3.0000 mL | RESPIRATORY_TRACT | 3 refills | Status: DC | PRN

## 2022-08-07 MED ORDER — TRUE METRIX BLOOD GLUCOSE TEST VI STRP: ORAL_STRIP | 12 refills | Status: DC

## 2022-08-07 MED ORDER — ALBUTEROL SULFATE HFA 108 (90 BASE) MCG/ACT IN AERS: 2.0000 | INHALATION_SPRAY | RESPIRATORY_TRACT | 3 refills | Status: DC | PRN

## 2022-08-07 NOTE — Patient Instructions (Signed)
No follow-ups on file.        Great to see you today.  I have refilled the medication(s) we provide.   If labs were collected, we will inform you of lab results once received either by echart message or telephone call.   - echart message- for normal results that have been seen by the patient already.   - telephone call: abnormal results or if patient has not viewed results in their echart.  

## 2022-08-07 NOTE — Telephone Encounter (Signed)
Rx sent 

## 2022-08-07 NOTE — Telephone Encounter (Signed)
Patient was to call back and let Dr. Claiborne Billings what kind of meter she has.  Patient needs strips and lancets for [facility] brand [facility]  [facility] - [address]

## 2022-08-07 NOTE — Progress Notes (Signed)
Mary Ferguson , [birth year], 71 y.o., female MRN: [id number] Patient Care Team    Relationship Specialty Notifications Start End  Ma Hillock, DO PCP - General Family Medicine  07/20/22   Croitoru, Dani Gobble, MD Consulting Physician Cardiology  03/21/21   Monna Fam, MD Consulting Physician Ophthalmology  03/21/21   Remo Lipps, MD Referring Physician Surgery  03/21/21   Boyd Kerbs, MD Referring Physician Neurology  03/21/21   Troy Sine, MD Consulting Physician Cardiology  03/21/21   Cassell Clement., MD Physician Assistant Sports Medicine  03/21/21   Rachelle Hora, MD Referring Physician Dermatology  03/21/21   Laurin Coder, MD Consulting Physician Pulmonary Disease  08/07/22     Chief Complaint  Patient presents with   [facility] f/u     Subjective:  Mary Ferguson  is a 71 y.o. female presents for hospital follow up after recent admission on 07/20/2022 for primary diagnosis fall/rhabdomyolysis.  Patient was discharged on 07/25/2022 to home. Patients discharge summary has been reviewed, as well as all labs/image studies obtained during hospitalization.  Medication reconciliation completed today.  Patients hospital course: Patient had fallen at home and was too weak to get up and was found 3 days later by her neighbor.  She was hypoxic at that time.  She had elevated LFTs secondary to rhabdomyolysis.  She was provided with fluids.  She also was noted to have a case of diverticulitis that was acute on CT.  She was treated with IV Unasyn and transition to oral Augmentin which she is completing at home.  During her hospital stay Mary Ferguson was discontinued secondary to cost and started on metformin which she reports gave her diarrhea in the past. Since hospital discharge patient reports overall she is doing okay.  She is on home oxygen.  She does not have follow-up scheduled with her pulmonologist.  Today she is on 4 L of nasal cannula to maintain stats.   She is appropriately treated for her diverticulitis and states that she has no pain.  She is taking the metformin 500 mg twice daily.  She is not taking Symbicort because it makes her mouth hurt.  She does not have any DuoNeb any longer.  And she is out of her albuterol.  She is starting outpatient physical therapy.  Recent Labs  Lab 08/07/22 0858  HGB 13.7  HCT 42.2  WBC 8.3  PLT 376.0      Latest Ref Rng & Units 08/07/2022    8:58 AM 07/24/2022    4:24 AM 07/23/2022    8:34 AM  CMP  Glucose 70 - 99 mg/dL 117  121  122   BUN 6 - 23 mg/dL _0 Creatinine 0.40 - 1.20 mg/dL 1.27  0.98  0.89   Sodium 135 - 145 mEq/L 139  137  139   Potassium 3.5 - 5.1 mEq/L 4.2  3.6  3.4   Chloride 96 - 112 mEq/L 103  105  106   CO2 19 - 32 mEq/L _1 Calcium 8.4 - 10.5 mg/dL 10.0  8.7  8.9   Total Protein 6.0 - 8.3 g/dL 6.5  5.6  6.3   Total Bilirubin 0.2 - 1.2 mg/dL 0.8  0.8  0.5   Alkaline Phos 39 - 117 U/L 76  59  69   AST 0 - 37 U/L 19  31  36   ALT 0 -  35 U/L _0 MR THORACIC SPINE W WO CONTRAST  Result Date: 07/22/2022 CLINICAL DATA:  Chronic mid to low back pain. EXAM: MRI THORACIC AND LUMBAR SPINE WITHOUT AND WITH CONTRAST TECHNIQUE: Multiplanar and multiecho pulse sequences of the thoracic and lumbar spine were obtained without and with intravenous contrast. CONTRAST:  56m GADAVIST GADOBUTROL 1 MMOL/ML IV SOLN COMPARISON:  CTs of the chest, abdomen and pelvis and thoracic and lumbar spine 07/20/2022. FINDINGS: MRI THORACIC SPINE FINDINGS Alignment: Mildly exaggerated thoracic kyphosis. No focal angulation or listhesis. Vertebrae: No evidence of acute fracture or traumatic subluxation. Minimally heterogeneous marrow signal without suspicious lesion on inversion recovery imaging. No abnormal osseous enhancement. Cord: The thoracic cord appears normal in signal and caliber.No abnormal intradural enhancement. Paraspinal and other soft tissues: No significant  paraspinal findings. Trace bilateral pleural effusions. Disc levels: No evidence of discitis or osteomyelitis. Mild multilevel spondylosis with paraspinal osteophytes, partial ankylosing at T9-10. Scattered small disc protrusions, most notable at T6-7 and T7-8. Multilevel facet hypertrophy. No cord deformity or large disc herniation identified. Facet hypertrophy contributes to mild foraminal narrowing at multiple levels. MRI LUMBAR SPINE FINDINGS Segmentation: Conventional anatomy assumed, with the last open disc space designated L5-S1.Concordant with prior imaging. Alignment: Stable minimal anterolisthesis at L5-S1 secondary to chronic bilateral L5 pars defects. Vertebrae: Chronic superior endplate compression deformity at L1 as seen on recent CT. No associated marrow edema or abnormal enhancement. No acute osseous findings. Chronic bilateral L5 pars defects. Mild sacroiliac degenerative changes bilaterally. Conus medullaris: Extends to the L1-2 level and appears normal. No abnormal intradural enhancement. Paraspinal and other soft tissues: No significant paraspinal findings. Bilateral renal sinus cysts, unchanged from recent CT; no follow-up imaging recommended. Disc levels: T12-L1: Mild disc bulging and facet hypertrophy. No spinal stenosis or nerve root encroachment. L1-2: Disc height and hydration are maintained. No spinal stenosis or nerve root encroachment. L2-3: Preserved disc height. Mild disc bulging and facet hypertrophy. No spinal stenosis or nerve root encroachment. L3-4: Preserved disc height. Mild disc bulging and bilateral facet hypertrophy. No significant spinal stenosis or nerve root encroachment. L4-5: Mild loss of disc height with mild annular disc bulging eccentric to the left. Moderate bilateral facet hypertrophy. Mild spinal stenosis with mild lateral recess and foraminal narrowing bilaterally. 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 Mild disc  uncovering and bulging contributing to mild foraminal narrowing bilaterally. Evidence of previous bilateral laminectomies. IMPRESSION: 1. No acute findings or explanation for the patient's symptoms. 2. Chronic superior endplate compression deformity at L1. No acute osseous findings. 3. Mild thoracic spondylosis without cord deformity or high-grade spinal stenosis. 4. 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 5. Mild disc bulging and facet hypertrophy at L4-5 contributing to mild spinal stenosis with mild lateral recess and foraminal narrowing bilaterally. Electronically Signed   By: WRichardean SaleM.D.   On: 07/22/2022 11:44   MR Lumbar Spine W Wo Contrast  Result Date: 07/22/2022 CLINICAL DATA:  Chronic mid to low back pain. EXAM: MRI THORACIC AND LUMBAR SPINE WITHOUT AND WITH CONTRAST TECHNIQUE: Multiplanar and multiecho pulse sequences of the thoracic and lumbar spine were obtained without and with intravenous contrast. CONTRAST:  140mGADAVIST GADOBUTROL 1 MMOL/ML IV SOLN COMPARISON:  CTs of the chest, abdomen and pelvis and thoracic and lumbar spine 07/20/2022. FINDINGS: MRI THORACIC SPINE FINDINGS Alignment: Mildly exaggerated thoracic kyphosis. No focal angulation  or listhesis. Vertebrae: No evidence of acute fracture or traumatic subluxation. Minimally heterogeneous marrow signal without suspicious lesion on inversion recovery imaging. No abnormal osseous enhancement. Cord: The thoracic cord appears normal in signal and caliber.No abnormal intradural enhancement. Paraspinal and other soft tissues: No significant paraspinal findings. Trace bilateral pleural effusions. Disc levels: No evidence of discitis or osteomyelitis. Mild multilevel spondylosis with paraspinal osteophytes, partial ankylosing at T9-10. Scattered small disc protrusions, most notable at T6-7 and T7-8. Multilevel facet hypertrophy. No cord deformity or  large disc herniation identified. Facet hypertrophy contributes to mild foraminal narrowing at multiple levels. MRI LUMBAR SPINE FINDINGS Segmentation: Conventional anatomy assumed, with the last open disc space designated L5-S1.Concordant with prior imaging. Alignment: Stable minimal anterolisthesis at L5-S1 secondary to chronic bilateral L5 pars defects. Vertebrae: Chronic superior endplate compression deformity at L1 as seen on recent CT. No associated marrow edema or abnormal enhancement. No acute osseous findings. Chronic bilateral L5 pars defects. Mild sacroiliac degenerative changes bilaterally. Conus medullaris: Extends to the L1-2 level and appears normal. No abnormal intradural enhancement. Paraspinal and other soft tissues: No significant paraspinal findings. Bilateral renal sinus cysts, unchanged from recent CT; no follow-up imaging recommended. Disc levels: T12-L1: Mild disc bulging and facet hypertrophy. No spinal stenosis or nerve root encroachment. L1-2: Disc height and hydration are maintained. No spinal stenosis or nerve root encroachment. L2-3: Preserved disc height. Mild disc bulging and facet hypertrophy. No spinal stenosis or nerve root encroachment. L3-4: Preserved disc height. Mild disc bulging and bilateral facet hypertrophy. No significant spinal stenosis or nerve root encroachment. L4-5: Mild loss of disc height with mild annular disc bulging eccentric to the left. Moderate bilateral facet hypertrophy. Mild spinal stenosis with mild lateral recess and foraminal narrowing bilaterally. 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 Mild disc uncovering and bulging contributing to mild foraminal narrowing bilaterally. Evidence of previous bilateral laminectomies. IMPRESSION: 1. No acute findings or explanation for the patient's symptoms. 2. Chronic superior endplate compression deformity at L1. No acute osseous findings. 3. Mild thoracic spondylosis without  cord deformity or high-grade spinal stenosis. 4. 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 5. Mild disc bulging and facet hypertrophy at L4-5 contributing to mild spinal stenosis with mild lateral recess and foraminal narrowing bilaterally. Electronically Signed   By: Richardean Sale M.D.   On: 07/22/2022 11:44   MR BRAIN WO CONTRAST  Result Date: 07/21/2022 CLINICAL DATA:  Stroke suspected EXAM: MRI HEAD WITHOUT CONTRAST TECHNIQUE: Multiplanar, multiecho pulse sequences of the brain and surrounding structures were obtained without intravenous contrast. COMPARISON:  No prior MRI, correlation is made with CT head 07/20/2022 FINDINGS: Brain: No restricted diffusion to suggest acute or subacute infarct. No acute hemorrhage, mass, mass effect, or midline shift. Focus of hemosiderin deposition in the lateral left cerebellum, likely sequela of prior microhemorrhage. No hydrocephalus or extra-axial collection. Scattered T2 hyperintense signal in the periventricular white matter, likely the sequela of mild-to-moderate chronic small vessel ischemic disease. Dilated perivascular spaces in the bilateral basal ganglia. Vascular: Normal arterial flow voids. Skull and upper cervical spine: Hyperostosis frontalis. Otherwise normal marrow signal. Sinuses/Orbits: Clear paranasal sinuses. Status post bilateral lens replacements. Other: Trace fluid in left mastoid air cells. IMPRESSION: No acute intracranial process. No evidence of acute or subacute infarct. Electronically Signed   By: Merilyn Baba M.D.   On: 07/21/2022 03:16   CT ABDOMEN PELVIS W CONTRAST  Result Date: 07/20/2022 CLINICAL  DATA:  Pulmonary embolism (PE) suspected, high prob; LLQ abdominal pain EXAM: CT ANGIOGRAPHY CHEST CT ABDOMEN AND PELVIS WITH CONTRAST TECHNIQUE: Multidetector CT imaging of the chest was performed using the standard protocol during bolus administration of  intravenous contrast. Multiplanar CT image reconstructions and MIPs were obtained to evaluate the vascular anatomy. Multidetector CT imaging of the abdomen and pelvis was performed using the standard protocol during bolus administration of intravenous contrast. RADIATION DOSE REDUCTION: This exam was performed according to the departmental dose-optimization program which includes automated exposure control, adjustment of the mA and/or kV according to patient size and/or use of iterative reconstruction technique. CONTRAST:  90m OMNIPAQUE IOHEXOL 350 MG/ML SOLN COMPARISON:  None Available. FINDINGS: CTA CHEST FINDINGS Cardiovascular: Satisfactory opacification of the pulmonary arteries to the segmental level. No evidence of pulmonary embolism. normal heart size. No significant pericardial effusion. The thoracic aorta is normal in caliber. Mild atherosclerotic plaque of the thoracic aorta. At least 3 vessel coronary artery calcifications. Mediastinum/Nodes: No enlarged mediastinal, hilar, or axillary lymph nodes. Thyroid gland, trachea, and esophagus demonstrate no significant findings. Small volume hiatal hernia. Lungs/Pleura: Low lung volumes. No focal consolidation. 4 mm subpleural micronodule within the right middle lobe (7:41). No pulmonary mass. No pleural effusion. No pneumothorax. Musculoskeletal: No chest wall abnormality. No suspicious lytic or blastic osseous lesions. No acute displaced fracture. Multilevel degenerative changes of the spine. Review of the MIP images confirms the above findings. CT ABDOMEN and PELVIS FINDINGS Hepatobiliary: No focal liver abnormality. Status post cholecystectomy. Common bile duct dilatation which can be seen in the post cholecystectomy setting. Pancreas: Diffusely atrophic. No focal lesion. Otherwise normal pancreatic contour. No surrounding inflammatory changes. No main pancreatic ductal dilatation. Spleen: Normal in size without focal abnormality. Adrenals/Urinary Tract:  No adrenal nodule bilaterally. Bilateral kidneys enhance symmetrically. No hydronephrosis. No hydroureter. Bilateral parapelvic fluid density lesions likely representing simple renal cysts. Simple renal cysts, in the absence of clinically indicated signs/symptoms, require no independent follow-up. The urinary bladder is unremarkable. Stomach/Bowel: Stomach is within normal limits. No evidence of bowel wall thickening or dilatation. Second portion of the duodenum diverticula. Colonic diverticulosis. The appendix is not definitely identified with no inflammatory changes in the right lower quadrant to suggest acute appendicitis. Vascular/Lymphatic: No abdominal aorta or iliac aneurysm. Moderate to severe atherosclerotic plaque of the aorta and its branches. No abdominal, pelvic, or inguinal lymphadenopathy. Reproductive: Status post hysterectomy. No adnexal masses. Other: Nonspecific slight fat stranding within the left abdomen/pelvis (6:47, 3:78) along the proximal sigmoid colon. No intraperitoneal free fluid. No intraperitoneal free gas. No organized fluid collection. Musculoskeletal: Diastasis rectus. Small shallow supraumbilical ventral wall hernia with a non abdominal defect of 7.5 cm containing couple short loops of small bowel as well as mesenteric fat (3:63, [id number]. Large infraumbilical ventral hernia containing several loops of small bowel and large bowel as well as mesentery with an abdominal defect of 13 x 13 cm. No suspicious lytic or blastic osseous lesions. No acute displaced fracture. Multilevel degenerative changes of the spine. L1 chronic appearing compression fracture. Review of the MIP images confirms the above findings. IMPRESSION: 1. No pulmonary embolus. 2. Low lung volumes with no acute intrathoracic abnormality. 3. Small hiatal hernia. 4. Colonic diverticulosis. Nonspecific slight fat stranding within the left abdomen/pelvis along the proximal sigmoid colon. No associated bowel wall  thickening. Finding may represent resolving or developing mild acute diverticulitis. 5. Supraumbilical and infraumbilical ventral hernias containing bowel with large abdominal defect as well as associated diastasis rectus. No findings suggest  associated bowel obstruction or ischemia. Electronically Signed   By: Iven Finn M.D.   On: 07/20/2022 20:12   CT Angio Chest PE W and/or Wo Contrast  Result Date: 07/20/2022 CLINICAL DATA:  Pulmonary embolism (PE) suspected, high prob; LLQ abdominal pain EXAM: CT ANGIOGRAPHY CHEST CT ABDOMEN AND PELVIS WITH CONTRAST TECHNIQUE: Multidetector CT imaging of the chest was performed using the standard protocol during bolus administration of intravenous contrast. Multiplanar CT image reconstructions and MIPs were obtained to evaluate the vascular anatomy. Multidetector CT imaging of the abdomen and pelvis was performed using the standard protocol during bolus administration of intravenous contrast. RADIATION DOSE REDUCTION: This exam was performed according to the departmental dose-optimization program which includes automated exposure control, adjustment of the mA and/or kV according to patient size and/or use of iterative reconstruction technique. CONTRAST:  25m OMNIPAQUE IOHEXOL 350 MG/ML SOLN COMPARISON:  None Available. FINDINGS: CTA CHEST FINDINGS Cardiovascular: Satisfactory opacification of the pulmonary arteries to the segmental level. No evidence of pulmonary embolism. normal heart size. No significant pericardial effusion. The thoracic aorta is normal in caliber. Mild atherosclerotic plaque of the thoracic aorta. At least 3 vessel coronary artery calcifications. Mediastinum/Nodes: No enlarged mediastinal, hilar, or axillary lymph nodes. Thyroid gland, trachea, and esophagus demonstrate no significant findings. Small volume hiatal hernia. Lungs/Pleura: Low lung volumes. No focal consolidation. 4 mm subpleural micronodule within the right middle lobe (7:41). No  pulmonary mass. No pleural effusion. No pneumothorax. Musculoskeletal: No chest wall abnormality. No suspicious lytic or blastic osseous lesions. No acute displaced fracture. Multilevel degenerative changes of the spine. Review of the MIP images confirms the above findings. CT ABDOMEN and PELVIS FINDINGS Hepatobiliary: No focal liver abnormality. Status post cholecystectomy. Common bile duct dilatation which can be seen in the post cholecystectomy setting. Pancreas: Diffusely atrophic. No focal lesion. Otherwise normal pancreatic contour. No surrounding inflammatory changes. No main pancreatic ductal dilatation. Spleen: Normal in size without focal abnormality. Adrenals/Urinary Tract: No adrenal nodule bilaterally. Bilateral kidneys enhance symmetrically. No hydronephrosis. No hydroureter. Bilateral parapelvic fluid density lesions likely representing simple renal cysts. Simple renal cysts, in the absence of clinically indicated signs/symptoms, require no independent follow-up. The urinary bladder is unremarkable. Stomach/Bowel: Stomach is within normal limits. No evidence of bowel wall thickening or dilatation. Second portion of the duodenum diverticula. Colonic diverticulosis. The appendix is not definitely identified with no inflammatory changes in the right lower quadrant to suggest acute appendicitis. Vascular/Lymphatic: No abdominal aorta or iliac aneurysm. Moderate to severe atherosclerotic plaque of the aorta and its branches. No abdominal, pelvic, or inguinal lymphadenopathy. Reproductive: Status post hysterectomy. No adnexal masses. Other: Nonspecific slight fat stranding within the left abdomen/pelvis (6:47, 3:78) along the proximal sigmoid colon. No intraperitoneal free fluid. No intraperitoneal free gas. No organized fluid collection. Musculoskeletal: Diastasis rectus. Small shallow supraumbilical ventral wall hernia with a non abdominal defect of 7.5 cm containing couple short loops of small bowel as  well as mesenteric fat (3:63, [id number]. Large infraumbilical ventral hernia containing several loops of small bowel and large bowel as well as mesentery with an abdominal defect of 13 x 13 cm. No suspicious lytic or blastic osseous lesions. No acute displaced fracture. Multilevel degenerative changes of the spine. L1 chronic appearing compression fracture. Review of the MIP images confirms the above findings. IMPRESSION: 1. No pulmonary embolus. 2. Low lung volumes with no acute intrathoracic abnormality. 3. Small hiatal hernia. 4. Colonic diverticulosis. Nonspecific slight fat stranding within the left abdomen/pelvis along the proximal sigmoid colon. No associated  bowel wall thickening. Finding may represent resolving or developing mild acute diverticulitis. 5. Supraumbilical and infraumbilical ventral hernias containing bowel with large abdominal defect as well as associated diastasis rectus. No findings suggest associated bowel obstruction or ischemia. Electronically Signed   By: Iven Finn M.D.   On: 07/20/2022 20:12   CT Lumbar Spine Wo Contrast  Result Date: 07/20/2022 CLINICAL DATA:  Found down. EXAM: CT THORACIC AND LUMBAR SPINE WITHOUT CONTRAST TECHNIQUE: Multidetector CT imaging of the thoracic and lumbar spine was performed without contrast. Multiplanar CT image reconstructions were also generated. RADIATION DOSE REDUCTION: This exam was performed according to the departmental dose-optimization program which includes automated exposure control, adjustment of the mA and/or kV according to patient size and/or use of iterative reconstruction technique. COMPARISON:  None Available. FINDINGS: CT THORACIC SPINE FINDINGS Alignment: Normal Vertebrae: No acute fracture or bone lesion. Mild compression deformity T11 appears remote. No posterior rib fractures are identified. Paraspinal and other soft tissues: No significant findings. Moderate-sized hiatal hernia. Dependent bibasilar atelectasis. Coronary  artery and aortic calcifications. Disc levels: No large disc protrusions, spinal foraminal stenosis. CT LUMBAR SPINE FINDINGS Segmentation: There are five lumbar type vertebral bodies. The last full intervertebral disc space is labeled L5-S1. Alignment: Normal Vertebrae: L1 compression fracture appears remote. No acute fracture lines or paraspinal hematoma. No bone lesions. Bilateral pars defects noted at L5 with minimal anterolisthesis. Paraspinal and other soft tissues: Advanced aortic calcifications but no aneurysm. Disc levels: L1-2: No significant findings. L2-3: Advanced facet disease but no spinal or foraminal stenosis. L3-4: Advanced facet disease and mild bulging annulus but no significant spinal or foraminal stenosis. L4-5: Severe facet disease and despite prior laminectomy there is moderately severe spinal and bilateral lateral recess stenosis. L5-S1: Bulging uncovered disc but generous spinal canal. Wide decompressive laminectomy. No spinal or foraminal stenosis. IMPRESSION: 1. No acute thoracic or lumbar spine fracture. Remote appearing T11 and L1 fractures. 2. Postoperative changes in the lower lumbar spine with laminectomy defects at L4-5 and L5-S1. 3. 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 4. Bilateral pars defects at L5. 5. Aortic atherosclerosis. Aortic Atherosclerosis (ICD10-I70.0). Electronically Signed   By: Marijo Sanes M.D.   On: 07/20/2022 17:04   CT Thoracic Spine Wo Contrast  Result Date: 07/20/2022 CLINICAL DATA:  Found down. EXAM: CT THORACIC AND LUMBAR SPINE WITHOUT CONTRAST TECHNIQUE: Multidetector CT imaging of the thoracic and lumbar spine was performed without contrast. Multiplanar CT image reconstructions were also generated. RADIATION DOSE REDUCTION: This exam was performed according to the departmental dose-optimization program which includes automated exposure control, adjustment of the mA and/or kV according to patient size and/or use of  iterative reconstruction technique. COMPARISON:  None Available. FINDINGS: CT THORACIC SPINE FINDINGS Alignment: Normal Vertebrae: No acute fracture or bone lesion. Mild compression deformity T11 appears remote. No posterior rib fractures are identified. Paraspinal and other soft tissues: No significant findings. Moderate-sized hiatal hernia. Dependent bibasilar atelectasis. Coronary artery and aortic calcifications. Disc levels: No large disc protrusions, spinal foraminal stenosis. CT LUMBAR SPINE FINDINGS Segmentation: There are five lumbar type vertebral bodies. The last full intervertebral disc space is labeled L5-S1. Alignment: Normal Vertebrae: L1 compression fracture appears remote. No acute fracture lines or paraspinal hematoma. No bone lesions. Bilateral pars defects noted at L5 with minimal anterolisthesis. Paraspinal and other soft tissues: Advanced aortic calcifications but no aneurysm. Disc levels: L1-2: No significant findings. L2-3: Advanced facet disease but no spinal or foraminal stenosis. L3-4: Advanced facet disease and mild  bulging annulus but no significant spinal or foraminal stenosis. L4-5: Severe facet disease and despite prior laminectomy there is moderately severe spinal and bilateral lateral recess stenosis. L5-S1: Bulging uncovered disc but generous spinal canal. Wide decompressive laminectomy. No spinal or foraminal stenosis. IMPRESSION: 1. No acute thoracic or lumbar spine fracture. Remote appearing T11 and L1 fractures. 2. Postoperative changes in the lower lumbar spine with laminectomy defects at L4-5 and L5-S1. 3. 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 4. Bilateral pars defects at L5. 5. Aortic atherosclerosis. Aortic Atherosclerosis (ICD10-I70.0). Electronically Signed   By: Marijo Sanes M.D.   On: 07/20/2022 17:04   CT Head Wo Contrast  Result Date: 07/20/2022 CLINICAL DATA:  71 year old female with fall. EXAM: CT HEAD WITHOUT CONTRAST CT  CERVICAL SPINE WITHOUT CONTRAST TECHNIQUE: Multidetector CT imaging of the head and cervical spine was performed following the standard protocol without intravenous contrast. Multiplanar CT image reconstructions of the cervical spine were also generated. RADIATION DOSE REDUCTION: This exam was performed according to the departmental dose-optimization program which includes automated exposure control, adjustment of the mA and/or kV according to patient size and/or use of iterative reconstruction technique. COMPARISON:  None Available. FINDINGS: CT HEAD FINDINGS Brain: Age indeterminate infarct versus chronic ischemic changes in the RIGHT basal ganglia noted. No other infarct, hemorrhage, mass lesion or mass effect, hydrocephalus or extra-axial collection noted. Mild atrophy and chronic small-vessel white matter ischemic changes noted. Vascular: Carotid and vertebral atherosclerotic calcifications are noted. Skull: Normal. Negative for fracture or focal lesion. Sinuses/Orbits: No acute finding. Other: None. CT CERVICAL SPINE FINDINGS Alignment: Normal. Skull base and vertebrae: No acute fracture. No primary bone lesion or focal pathologic process. Soft tissues and spinal canal: No prevertebral fluid or swelling. No visible canal hematoma. Disc levels: Mild multilevel facet arthropathy noted contributing to mild bony foraminal narrowing throughout the cervical spine. Disc spaces are relatively maintained. Upper chest: No acute abnormality Other: None IMPRESSION: 1. Age indeterminate infarct versus chronic ischemic changes in the RIGHT basal ganglia. No other evidence of acute intracranial abnormality. No evidence of hemorrhage. 2. No evidence of acute injury to the cervical spine. 3. Mild atrophy and chronic small-vessel white matter ischemic changes. Electronically Signed   By: Margarette Canada M.D.   On: 07/20/2022 16:13   CT Cervical Spine Wo Contrast  Result Date: 07/20/2022 CLINICAL DATA:  71 year old female with  fall. EXAM: CT HEAD WITHOUT CONTRAST CT CERVICAL SPINE WITHOUT CONTRAST TECHNIQUE: Multidetector CT imaging of the head and cervical spine was performed following the standard protocol without intravenous contrast. Multiplanar CT image reconstructions of the cervical spine were also generated. RADIATION DOSE REDUCTION: This exam was performed according to the departmental dose-optimization program which includes automated exposure control, adjustment of the mA and/or kV according to patient size and/or use of iterative reconstruction technique. COMPARISON:  None Available. FINDINGS: CT HEAD FINDINGS Brain: Age indeterminate infarct versus chronic ischemic changes in the RIGHT basal ganglia noted. No other infarct, hemorrhage, mass lesion or mass effect, hydrocephalus or extra-axial collection noted. Mild atrophy and chronic small-vessel white matter ischemic changes noted. Vascular: Carotid and vertebral atherosclerotic calcifications are noted. Skull: Normal. Negative for fracture or focal lesion. Sinuses/Orbits: No acute finding. Other: None. CT CERVICAL SPINE FINDINGS Alignment: Normal. Skull base and vertebrae: No acute fracture. No primary bone lesion or focal pathologic process. Soft tissues and spinal canal: No prevertebral fluid or swelling. No visible canal hematoma. Disc levels: Mild multilevel facet arthropathy noted contributing to mild bony  foraminal narrowing throughout the cervical spine. Disc spaces are relatively maintained. Upper chest: No acute abnormality Other: None IMPRESSION: 1. Age indeterminate infarct versus chronic ischemic changes in the RIGHT basal ganglia. No other evidence of acute intracranial abnormality. No evidence of hemorrhage. 2. No evidence of acute injury to the cervical spine. 3. Mild atrophy and chronic small-vessel white matter ischemic changes. Electronically Signed   By: Margarette Canada M.D.   On: 07/20/2022 16:13   DG Chest 2 View  Result Date: 07/20/2022 CLINICAL DATA:   Fall, left rib pain EXAM: CHEST - 2 VIEW COMPARISON:  06/17/2019 FINDINGS: Low volume portable examination. Cardiomegaly. Mild, diffuse bilateral interstitial pulmonary opacity and bronchovascular crowding. No obvious displaced rib fracture. IMPRESSION: 1. Low volume portable examination. 2. Cardiomegaly. 3. Mild, diffuse bilateral interstitial pulmonary opacity and bronchovascular crowding, likely edema. 4. No obvious displaced rib fracture or pneumothorax. CT may be helpful to further evaluate if fracture is suspected. Electronically Signed   By: Delanna Ahmadi M.D.   On: 07/20/2022 15:17   DG Elbow Complete Left  Result Date: 07/20/2022 CLINICAL DATA:  Pain after fall EXAM: LEFT ELBOW - COMPLETE 3+ VIEW COMPARISON:  None Available. FINDINGS: No acute fracture or dislocation in the left elbow. Mild degenerative arthritis. No elbow joint effusion. No significant soft tissue abnormality. IMPRESSION: No acute fracture or dislocation. Electronically Signed   By: Placido Sou M.D.   On: 07/20/2022 15:16   DG Shoulder Left  Result Date: 07/20/2022 CLINICAL DATA:  Fall.  Pain. EXAM: LEFT SHOULDER - 2+ VIEW COMPARISON:  None Available. FINDINGS: There is no evidence of fracture or dislocation. There is no evidence of arthropathy or other focal bone abnormality. Soft tissues are unremarkable. IMPRESSION: Negative. Electronically Signed   By: Misty Stanley M.D.   On: 07/20/2022 15:15   DG Tibia/Fibula Left  Result Date: 07/20/2022 CLINICAL DATA:  Fall.  Pain. EXAM: LEFT TIBIA AND FIBULA - 2 VIEW COMPARISON:  None Available. FINDINGS: There is no evidence of fracture or other focal bone lesions. Soft tissues are unremarkable. IMPRESSION: Negative. Electronically Signed   By: Misty Stanley M.D.   On: 07/20/2022 15:15   DG Hip Unilat W or Wo Pelvis 2-3 Views Left  Result Date: 07/20/2022 CLINICAL DATA:  Hip pain after fall EXAM: DG HIP (WITH OR WITHOUT PELVIS) 2-3V LEFT COMPARISON:  None Available.  FINDINGS: No acute fracture or dislocation in the pelvis. Degenerative changes pubic symphysis, both hips, SI joints and lower lumbar spine. IMPRESSION: No acute fracture or dislocation. Electronically Signed   By: Placido Sou M.D.   On: 07/20/2022 15:14        04/09/2022    8:49 AM  Depression screen PHQ 2/9  Decreased Interest 0  Down, Depressed, Hopeless 0  PHQ - 2 Score 0    Allergies  Allergen Reactions   Rofecoxib Anaphylaxis    VIOXX   Metformin And Related Other (See Comments)    Ibs    Benadryl Allergy [Diphenhydramine Hcl] Other (See Comments)    Depressed respirations   Cephalexin Rash   Gatifloxacin Other (See Comments)    Confusion Very low blood pressure [place]   Social History   Tobacco Use   Smoking status: Former   Smokeless tobacco: Never  Substance Use Topics   Alcohol use: Not Currently   Past Medical History:  Diagnosis Date   Acute and chronic respiratory failure with hypoxia ([place]) 01/24/2018   Asthma    Blood in stool  Chicken pox    Chronic generalized abdominal pain    Chronic pain    CKD (chronic kidney disease), stage III (HCC)    Colon polyps    COPD (chronic obstructive pulmonary disease) ([place])    COVID-19 2020   Depression    Diverticulitis    Very severe.  Has had 2 bowel resections and temporary colostomy in the past.   Fecal incontinence    Frequent headaches    GERD (gastroesophageal reflux disease)    Heart attack ([place])    Heart disease    History of blood transfusion    Hyperlipemia    Hypertension    Incontinence of feces 12/11/2016   Morbid obesity ([place])    Pneumonia due to infectious organism 01/12/2018   Postmenopausal 03/21/2021   PTSD (post-traumatic stress disorder)    Recurrent deep vein thrombosis (DVT) ([place])    x6   Urinary incontinence    Past Surgical History:  Procedure Laterality Date   ABDOMINAL HYSTERECTOMY  1995   APPENDECTOMY     CATARACT EXTRACTION Bilateral    CESAREAN SECTION  1984    CHOLECYSTECTOMY  2016   COLOSTOMY     and reversal   CORONARY STENT INTERVENTION N/A 06/18/2019   Procedure: CORONARY STENT INTERVENTION;  Surgeon: Wellington Hampshire, MD;  Location: [facility];  Service: Cardiovascular;  Laterality: N/A;   [place] and 2016 (2016 ventral w/ incarceration) 33 surgeries   HYSTEROSCOPY WITH D & C     13   LAPAROSCOPIC LYSIS OF ADHESIONS     x2   LEFT HEART CATH AND CORONARY ANGIOGRAPHY N/A 06/18/2019   Procedure: LEFT HEART CATH AND CORONARY ANGIOGRAPHY;  Surgeon: Wellington Hampshire, MD;  Location: [facility];  Service: Cardiovascular;  Laterality: N/A;   LEFT HEART CATH AND CORONARY ANGIOGRAPHY N/A 11/30/2019   Procedure: LEFT HEART CATH AND CORONARY ANGIOGRAPHY;  Surgeon: Troy Sine, MD;  Location: [facility];  Service: Cardiovascular;  Laterality: N/A;   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, 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   TONSILLECTOMY  1959   VEIN SURGERY     laser x2   Family History  Problem Relation Age of Onset   Depression Mother    Drug abuse Mother    Mental illness Mother    Learning disabilities Mother    Miscarriages / [place] Mother    Alcohol abuse Mother    Mental illness Father    Depression Father    Heart disease Father    Drug abuse Sister    Depression Sister    Asthma Sister    Alcohol abuse Sister    Early death Sister    Mental illness Son    Hypertension Son    Drug abuse Son    Depression Son    Asthma Son    Alcohol abuse Son    Clotting disorder Son    Heart disease Maternal Grandmother    Diabetes Maternal Grandmother    Arthritis Maternal Grandmother    COPD Maternal Grandmother    Parkinson's disease Maternal Grandmother    Heart disease Maternal Grandfather    Heart attack Maternal Grandfather    Early death Maternal Grandfather    COPD Maternal Grandfather    Heart disease Paternal Grandmother    Heart attack Paternal Grandmother     Depression Paternal Grandmother    Arthritis Paternal Grandmother    Breast  cancer Paternal Grandmother    Heart disease Paternal Grandfather    Heart attack Paternal Grandfather    Early death Paternal Grandfather    COPD Paternal Grandfather    Mental illness Half-Brother    Learning disabilities Half-Brother    Hypertension Half-Brother    Hyperlipidemia Half-Brother    Drug abuse Half-Brother    Heart disease Half-Brother    Diabetes Half-Brother    Depression Half-Brother    Asthma Half-Brother    Alcohol abuse Half-Brother    Arthritis Half-Brother    Kidney disease Half-Brother    Testicular cancer Half-Brother    Diabetes Half-Sister    Mental illness Half-Sister    Learning disabilities Half-Sister    Drug abuse Half-Sister    Depression Half-Sister    Alcohol abuse Half-Sister    Thyroid cancer Half-Sister    Allergies as of 08/07/2022       Reactions   Rofecoxib Anaphylaxis   VIOXX   Metformin And Related Other (See Comments)   Ibs    Benadryl Allergy [diphenhydramine Hcl] Other (See Comments)   Depressed respirations   Cephalexin Rash   Gatifloxacin Other (See Comments)   Confusion Very low blood pressure [place]        Medication List        Accurate as of August 07, 2022 11:59 PM. If you have any questions, ask your nurse or doctor.          STOP taking these medications    amoxicillin-clavulanate 875-125 MG tablet Commonly known as: AUGMENTIN Stopped by: Howard Pouch, DO   budesonide-formoterol 80-4.5 MCG/ACT inhaler Commonly known as: Symbicort Stopped by: Howard Pouch, DO   ondansetron 4 MG tablet Commonly known as: ZOFRAN Stopped by: Howard Pouch, DO       TAKE these medications    acetaminophen 500 MG tablet Commonly known as: TYLENOL Take 500-1,000 mg by mouth as needed for mild pain or moderate pain.   albuterol 108 (90 Base) MCG/ACT inhaler Commonly known as: VENTOLIN HFA Inhale 2 puffs into the lungs every 4  (four) hours as needed for wheezing or shortness of breath.   amitriptyline 50 MG tablet Commonly known as: ELAVIL Take 1 tablet (50 mg total) by mouth at bedtime.   amLODipine 2.5 MG tablet Commonly known as: NORVASC Take 1 tablet (2.5 mg total) by mouth daily.   atorvastatin 80 MG tablet Commonly known as: LIPITOR Take 1 tablet (80 mg total) by mouth every evening. What changed: when to take this   carvedilol 6.25 MG tablet Commonly known as: COREG TAKE 1 TABLET BY MOUTH IN THE MORNING AND 2 TABLETS IN THE EVENING What changed:  how much to take how to take this when to take this additional instructions   clotrimazole 1 % cream Commonly known as: Clotrimazole Anti-Fungal Apply 1 Application topically 2 (two) times daily. What changed:  when to take this reasons to take this   dicyclomine 20 MG tablet Commonly known as: BENTYL Take 1 tablet (20 mg total) by mouth 4 (four) times daily as needed for spasms. What changed: when to take this   diphenoxylate-atropine 2.5-0.025 MG tablet Commonly known as: LOMOTIL Take 1 tablet by mouth 4 (four) times daily as needed for diarrhea or loose stools.   famotidine 40 MG tablet Commonly known as: PEPCID Take 1 tablet (40 mg total) by mouth at bedtime.   fluticasone furoate-vilanterol 100-25 MCG/ACT Aepb Commonly known as: Breo Ellipta Inhale 1 puff into the lungs daily. Started by: Joseph Art  Jenine Krisher, DO   hydrOXYzine 25 MG tablet Commonly known as: ATARAX Take 1 tablet (25 mg total) by mouth every 8 (eight) hours as needed for itching. What changed: when to take this   ipratropium-albuterol 0.5-2.5 (3) MG/3ML Soln Commonly known as: DUONEB Take 3 mLs by nebulization every 4 (four) hours as needed.   isosorbide mononitrate 60 MG 24 hr tablet Commonly known as: IMDUR TAKE 1 TABLET(60 MG) BY MOUTH DAILY What changed:  how much to take how to take this when to take this additional instructions   metFORMIN 500 MG  tablet Commonly known as: GLUCOPHAGE Take 1 tablet (500 mg total) by mouth 2 (two) times daily with a meal.   nitroGLYCERIN 0.4 MG SL tablet Commonly known as: NITROSTAT PLACE 1 TABLET UNDER THE TONGUE EVERY 5 MINUTES FOR 3 DOSES AS NEEDED FOR CHEST PAIN What changed: See the new instructions.   psyllium 95 % Pack Commonly known as: HYDROCIL/METAMUCIL Take 1 packet by mouth daily.   rivaroxaban 20 MG Tabs tablet Commonly known as: Xarelto TAKE 1 TABLET(20 MG) BY MOUTH EVERY EVENING What changed:  how much to take how to take this when to take this additional instructions   True Metrix Blood Glucose Test test strip Generic drug: glucose blood Use as instructed Started by: Carney Bern   TRUEplus Lancets 33G Misc 1 Stick by Does not apply route daily. Started by: Carney Bern   Vitamin D3 50 MCG (2000 UT) capsule Take 2 capsules (4,000 Units total) by mouth daily. What changed: how much to take        All past medical history, surgical history, allergies, family history, immunizations and medications were updated in the EMR today and reviewed under the history and medication portions of their EMR.      ROS: Negative, with the exception of above mentioned in HPI   Objective:  BP 92/64   Pulse 96   Temp 98.1 F (36.7 C) (Oral)   Wt 241 lb (109.3 kg)   SpO2 96%   BMI 44.08 kg/m  Body mass index is 44.08 kg/m. Physical Exam Vitals and nursing note reviewed.  Constitutional:      General: She is not in acute distress.    Appearance: Normal appearance. She is normal weight. She is not ill-appearing or toxic-appearing.  HENT:     Head: Normocephalic and atraumatic.     Nose: No congestion or rhinorrhea.     Mouth/Throat:     Pharynx: No oropharyngeal exudate or posterior oropharyngeal erythema.  Eyes:     Extraocular Movements: Extraocular movements intact.     Conjunctiva/sclera: Conjunctivae normal.     Pupils: Pupils are equal, round, and reactive to  light.  Cardiovascular:     Rate and Rhythm: Normal rate and regular rhythm.  Pulmonary:     Effort: Pulmonary effort is normal. No respiratory distress.     Breath sounds: Normal breath sounds. No wheezing, rhonchi or rales.  Musculoskeletal:     Right lower leg: No edema.     Left lower leg: No edema.  Skin:    Findings: No rash.  Neurological:     Mental Status: She is alert and oriented to person, place, and time. Mental status is at baseline.     Motor: Weakness present.     Coordination: Coordination abnormal.     Gait: Gait abnormal.  Psychiatric:        Mood and Affect: Mood normal.        Behavior:  Behavior normal.        Thought Content: Thought content normal.        Judgment: Judgment normal.     Assessment/Plan: Akshita Italiano is a 71 y.o. female present for OV for Hospital discharge follow up Fall, subsequent encounter/rhabdomyolysis - CBC w/Diff - Comp Met (CMET) Urged her to hydrate well.  Labs collected today. Chronic bronchitis, unspecified chronic bronchitis type ([place]) Prescribed her Breo inhaler to replace her Symbicort.  Refilled her DuoNeb and albuterol. Strongly encouraged them to call our pulmonology team and get her scheduled ASAP - Ambulatory referral to Pulmonology  Need for influenza vaccination - Flu Vaccine QUAD High Dose(Fluad)  Diverticulitis She is being appropriately treated and has no more symptoms.  Hypotension, unspecified hypotension type Order to hold her amlodipine for now.  Suspect she may have some dehydration versus not needing her amlodipine any longer. Monitor BP and restart amlodipine if blood pressures above 130/80  Reviewed expectations re: course of current medical issues. Discussed self-management of symptoms. Outlined signs and symptoms indicating need for more acute intervention. Patient verbalized understanding and all questions were answered. Patient received an After-Visit Summary. Any changes in medications were  reviewed and patient was provided with updated med list with their AVS.     Orders Placed This Encounter  Procedures   Flu Vaccine QUAD High Dose(Fluad)   CBC w/Diff   Comp Met (CMET)   Ambulatory referral to Pulmonology     Note is dictated utilizing voice recognition software. Although note has been proof read prior to signing, occasional typographical errors still can be missed. If any questions arise, please do not hesitate to call for verification.   electronically signed by:  Howard Pouch, DO  [facility]

## 2022-08-08 ENCOUNTER — Telehealth: Payer: Self-pay | Admitting: Family Medicine

## 2022-08-08 ENCOUNTER — Ambulatory Visit: Payer: Medicare Other | Admitting: Primary Care

## 2022-08-08 NOTE — Telephone Encounter (Signed)
Pt called and is now having a very bad cough. She has been taking over the counter meds for the cough, but nothing is helping. She reports she has had a dry cough for some time now. She is asking if Dr. Claiborne Billings can call her in something for her coughing. She denied an appointment at this time due to just being here yesterday and she does not know her sons schedule. She also had another fall yesterday, and is asking that someone give her a call to advise her on what to do.

## 2022-08-09 ENCOUNTER — Ambulatory Visit: Payer: Medicare Other | Admitting: Physical Therapy

## 2022-08-09 ENCOUNTER — Other Ambulatory Visit: Payer: Self-pay

## 2022-08-09 ENCOUNTER — Telehealth: Payer: Self-pay | Admitting: Family Medicine

## 2022-08-09 MED ORDER — IPRATROPIUM-ALBUTEROL 0.5-2.5 (3) MG/3ML IN SOLN: 3.0000 mL | RESPIRATORY_TRACT | 3 refills | Status: DC | PRN

## 2022-08-09 NOTE — Telephone Encounter (Signed)
Pt called and stated her pharmacy [facility] in [place] reports that they need a new prescription for the Duoneb and a new script in order for it to be filled.

## 2022-08-09 NOTE — Progress Notes (Signed)
Need dx code for pending med, DuoNeb, per pharmacy.  J96.21 J44.9

## 2022-08-09 NOTE — Telephone Encounter (Signed)
Spoke with pt regarding OTC meds and instructions.

## 2022-08-09 NOTE — Telephone Encounter (Signed)
She can pick up over-the-counter Delsym, Mucinex DM or Robitussin to help with her cough. If cough is not improving or worsening over the next 5 days, then she needs to be seen so that we can evaluate her and see if she would need an antibiotic at that time.

## 2022-08-09 NOTE — Telephone Encounter (Signed)
Rx request already sent to provider.  

## 2022-08-09 NOTE — Telephone Encounter (Signed)
Called pt  yesterday, who stated that she didn't have any new injuries with fall but is sore. Please advise.

## 2022-08-12 ENCOUNTER — Ambulatory Visit: Payer: Medicare Other | Admitting: Primary Care

## 2022-08-12 ENCOUNTER — Emergency Department (HOSPITAL_BASED_OUTPATIENT_CLINIC_OR_DEPARTMENT_OTHER)
Admission: EM | Admit: 2022-08-12 | Discharge: 2022-08-12 | Disposition: A | Discharge status: Home / Self Care | Payer: Medicare Other | Attending: Emergency Medicine | Admitting: Emergency Medicine

## 2022-08-12 ENCOUNTER — Other Ambulatory Visit: Payer: Self-pay

## 2022-08-12 ENCOUNTER — Emergency Department (HOSPITAL_BASED_OUTPATIENT_CLINIC_OR_DEPARTMENT_OTHER): Payer: Medicare Other

## 2022-08-12 ENCOUNTER — Encounter (HOSPITAL_BASED_OUTPATIENT_CLINIC_OR_DEPARTMENT_OTHER): Payer: Self-pay

## 2022-08-12 DIAGNOSIS — N183 Chronic kidney disease, stage 3 unspecified: Secondary | ICD-10-CM | POA: Diagnosis not present

## 2022-08-12 DIAGNOSIS — B974 Respiratory syncytial virus as the cause of diseases classified elsewhere: Secondary | ICD-10-CM | POA: Insufficient documentation

## 2022-08-12 DIAGNOSIS — J449 Chronic obstructive pulmonary disease, unspecified: Secondary | ICD-10-CM | POA: Diagnosis not present

## 2022-08-12 DIAGNOSIS — I129 Hypertensive chronic kidney disease with stage 1 through stage 4 chronic kidney disease, or unspecified chronic kidney disease: Secondary | ICD-10-CM | POA: Insufficient documentation

## 2022-08-12 DIAGNOSIS — Z7901 Long term (current) use of anticoagulants: Secondary | ICD-10-CM | POA: Diagnosis not present

## 2022-08-12 DIAGNOSIS — E1122 Type 2 diabetes mellitus with diabetic chronic kidney disease: Secondary | ICD-10-CM | POA: Diagnosis not present

## 2022-08-12 DIAGNOSIS — J4540 Moderate persistent asthma, uncomplicated: Secondary | ICD-10-CM | POA: Diagnosis not present

## 2022-08-12 DIAGNOSIS — Z87891 Personal history of nicotine dependence: Secondary | ICD-10-CM | POA: Insufficient documentation

## 2022-08-12 DIAGNOSIS — J069 Acute upper respiratory infection, unspecified: Secondary | ICD-10-CM | POA: Insufficient documentation

## 2022-08-12 DIAGNOSIS — Z8616 Personal history of COVID-19: Secondary | ICD-10-CM | POA: Insufficient documentation

## 2022-08-12 DIAGNOSIS — I2510 Atherosclerotic heart disease of native coronary artery without angina pectoris: Secondary | ICD-10-CM | POA: Diagnosis not present

## 2022-08-12 DIAGNOSIS — Z79899 Other long term (current) drug therapy: Secondary | ICD-10-CM | POA: Diagnosis not present

## 2022-08-12 DIAGNOSIS — Z20822 Contact with and (suspected) exposure to covid-19: Secondary | ICD-10-CM | POA: Insufficient documentation

## 2022-08-12 DIAGNOSIS — Z7984 Long term (current) use of oral hypoglycemic drugs: Secondary | ICD-10-CM | POA: Insufficient documentation

## 2022-08-12 DIAGNOSIS — Z7951 Long term (current) use of inhaled steroids: Secondary | ICD-10-CM | POA: Insufficient documentation

## 2022-08-12 DIAGNOSIS — R0602 Shortness of breath: Secondary | ICD-10-CM | POA: Diagnosis present

## 2022-08-12 DIAGNOSIS — B338 Other specified viral diseases: Secondary | ICD-10-CM

## 2022-08-12 LAB — CBC
HCT: 43.4 % (ref 36.0–46.0)
Hemoglobin: 13.6 g/dL (ref 12.0–15.0)
MCH: 26.4 pg (ref 26.0–34.0)
MCHC: 31.3 g/dL (ref 30.0–36.0)
MCV: 84.3 fL (ref 80.0–100.0)
Platelets: 196 10*3/uL (ref 150–400)
RBC: 5.15 MIL/uL — ABNORMAL HIGH (ref 3.87–5.11)
RDW: 19.1 % — ABNORMAL HIGH (ref 11.5–15.5)
WBC: 8.9 10*3/uL (ref 4.0–10.5)
nRBC: 0 % (ref 0.0–0.2)

## 2022-08-12 LAB — BASIC METABOLIC PANEL
Anion gap: 8 (ref 5–15)
BUN: 15 mg/dL (ref 8–23)
CO2: 26 mmol/L (ref 22–32)
Calcium: 9.4 mg/dL (ref 8.9–10.3)
Chloride: 103 mmol/L (ref 98–111)
Creatinine, Ser: 1.41 mg/dL — ABNORMAL HIGH (ref 0.44–1.00)
GFR, Estimated: 40 mL/min — ABNORMAL LOW (ref >60)
Glucose, Bld: 129 mg/dL — ABNORMAL HIGH (ref 70–99)
Potassium: 4.2 mmol/L (ref 3.5–5.1)
Sodium: 137 mmol/L (ref 135–145)

## 2022-08-12 LAB — I-STAT VENOUS BLOOD GAS, ED
Acid-base deficit: 2 mmol/L (ref 0.0–2.0)
Bicarbonate: 25.7 mmol/L (ref 20.0–28.0)
Calcium, Ion: 1.22 mmol/L (ref 1.15–1.40)
HCT: 38 % (ref 36.0–46.0)
Hemoglobin: 12.9 g/dL (ref 12.0–15.0)
O2 Saturation: 51 %
Potassium: 6.1 mmol/L — ABNORMAL HIGH (ref 3.5–5.1)
Sodium: 136 mmol/L (ref 135–145)
TCO2: 27 mmol/L (ref 22–32)
pCO2, Ven: 55 mmHg (ref 44–60)
pH, Ven: 7.278 (ref 7.25–7.43)
pO2, Ven: 31 mmHg — CL (ref 32–45)

## 2022-08-12 LAB — PROTIME-INR
INR: 2.6 — ABNORMAL HIGH (ref 0.8–1.2)
Prothrombin Time: 27.7 seconds — ABNORMAL HIGH (ref 11.4–15.2)

## 2022-08-12 LAB — TROPONIN I (HIGH SENSITIVITY)
Troponin I (High Sensitivity): 6 ng/L (ref <18)
Troponin I (High Sensitivity): 6 ng/L (ref <18)

## 2022-08-12 LAB — RESP PANEL BY RT-PCR (RSV, FLU A&B, COVID)  RVPGX2
Influenza A by PCR: NEGATIVE
Influenza B by PCR: NEGATIVE
Resp Syncytial Virus by PCR: POSITIVE — AB
SARS Coronavirus 2 by RT PCR: NEGATIVE

## 2022-08-12 MED ORDER — METHYLPREDNISOLONE SODIUM SUCC 125 MG IJ SOLR
125.0000 mg | Freq: Once | INTRAMUSCULAR | Status: AC
  Administered 2022-08-12: 125 mg via INTRAVENOUS
  Filled 2022-08-12: qty 2

## 2022-08-12 MED ORDER — PREDNISONE 20 MG PO TABS: 60.0000 mg | ORAL_TABLET | Freq: Every day | ORAL | 0 refills | Status: AC

## 2022-08-12 MED ORDER — IPRATROPIUM-ALBUTEROL 0.5-2.5 (3) MG/3ML IN SOLN
3.0000 mL | Freq: Once | RESPIRATORY_TRACT | Status: AC
  Administered 2022-08-12: 3 mL via RESPIRATORY_TRACT
  Filled 2022-08-12: qty 3

## 2022-08-12 MED ORDER — SODIUM CHLORIDE 0.9 % IV BOLUS
1000.0000 mL | Freq: Once | INTRAVENOUS | Status: AC
  Administered 2022-08-12: 1000 mL via INTRAVENOUS

## 2022-08-12 NOTE — ED Provider Notes (Signed)
[facility] EMERGENCY DEPARTMENT Provider Note  CSN: [id number] Arrival date & time: 08/12/22 0569  Chief Complaint(s) Chest Pain and Shortness of Breath  HPI Mary Ferguson is a 71 y.o. female with history of COPD, CKD, coronary artery disease, hypertension, hyperlipidemia, prior DVT on Xarelto presenting to the emergency department with fatigue.  Patient reports 2 to 3 days of fatigue.  She also reports shortness of breath, cough, mild chest pain.  Patient son reports that she has been confused over the last few days, decreased energy, and today complaining of shortness of breath so he brought her to the emergency department.  No fevers.  No nausea, vomiting, diarrhea.  Denies any urinary symptoms.  Has been exposed to RSV recently.   Past Medical History Past Medical History:  Diagnosis Date   Acute and chronic respiratory failure with hypoxia (HCC) 01/24/2018   Asthma    Blood in stool    Chicken pox    Chronic generalized abdominal pain    Chronic pain    CKD (chronic kidney disease), stage III (HCC)    Colon polyps    COPD (chronic obstructive pulmonary disease) (HCC)    COVID-19 2020   Depression    Diverticulitis    Very severe.  Has had 2 bowel resections and temporary colostomy in the past.   Fecal incontinence    Frequent headaches    GERD (gastroesophageal reflux disease)    Heart attack (HCC)    Heart disease    History of blood transfusion    Hyperlipemia    Hypertension    Incontinence of feces 12/11/2016   Morbid obesity (HCC)    Pneumonia due to infectious organism 01/12/2018   Postmenopausal 03/21/2021   PTSD (post-traumatic stress disorder)    Recurrent deep vein thrombosis (DVT) (HCC)    x6   Urinary incontinence    Patient Active Problem List   Diagnosis Date Noted   Rhabdomyolysis 07/21/2022   Acute kidney injury superimposed on chronic kidney disease (HCC) 07/21/2022   Fall at home, initial encounter 07/21/2022   High anion gap metabolic  acidosis 07/21/2022   Elevated liver enzymes 07/21/2022   Acute diverticulitis 07/20/2022   Chronic venous insufficiency 01/08/2022   Irritable bowel syndrome with diarrhea 06/21/2021   Imdur therapy-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) 03/22/2021   Compression fracture of L1 lumbar vertebra (HCC) 03/22/2021   Lumbar radiculopathy 03/22/2021   Neuropathy 03/22/2021   Paresthesia of bilateral legs 03/22/2021   Aortic atherosclerosis (HCC) 03/22/2021   Type II diabetes mellitus with manifestations (HCC) 03/21/2021   COPD (chronic obstructive pulmonary disease) (HCC) 03/21/2021   CAD S/P PCI 06/30/2019   Dyslipidemia, goal LDL below 70 06/30/2019   Morbid obesity (HCC)    Essential hypertension    History of non-ST elevation myocardial infarction (NSTEMI) 06/17/2019   Acute on chronic respiratory failure with hypoxemia (HCC) 01/24/2018   Mild episode of recurrent major depressive disorder (HCC) 03/25/2017   CKD (chronic kidney disease) stage 3, GFR 30-59 ml/min (HCC) 03/19/2017   Vitamin D deficiency 03/19/2017   B12 deficiency 03/19/2017   Nocturnal oxygen desaturation 12/11/2016   BMI 45.0-49.9, adult (HCC) 12/11/2016   Ambulates with cane 12/11/2016   Chronic pruritic rash in adult 07/08/2016   Chronic pain syndrome 03/13/2015   Abdominal wall hernia 01/27/2014   Obstructive sleep apnea (adult) (pediatric) 08/05/2013   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HCC) 02/18/2013   Moderate persistent asthma 12/30/2012   History of recurrent deep vein thrombosis (DVT) 12/30/2012  Home Medication(s) Prior to Admission medications   Medication Sig Start Date End Date Taking? Authorizing Provider  predniSONE (DELTASONE) 20 MG tablet Take 3 tablets (60 mg total) by mouth daily for 5 days. 08/12/22 08/17/22 Yes Lonell Grandchild, MD  acetaminophen (TYLENOL) 500 MG tablet Take 500-1,000 mg by mouth as needed for mild pain or moderate pain.    [provider]  albuterol  (VENTOLIN HFA) 108 (90 Base) MCG/ACT inhaler Inhale 2 puffs into the lungs every 4 (four) hours as needed for wheezing or shortness of breath. 08/07/22   Kuneff, Renee A, DO  amitriptyline (ELAVIL) 50 MG tablet Take 1 tablet (50 mg total) by mouth at bedtime. 04/05/22   Kuneff, Renee A, DO  amLODipine (NORVASC) 2.5 MG tablet Take 1 tablet (2.5 mg total) by mouth daily. 10/09/21   Azalee Course, PA  atorvastatin (LIPITOR) 80 MG tablet Take 1 tablet (80 mg total) by mouth every evening. Patient taking differently: Take 80 mg by mouth daily. 10/09/21   Azalee Course, PA  carvedilol (COREG) 6.25 MG tablet TAKE 1 TABLET BY MOUTH IN THE MORNING AND 2 TABLETS IN THE EVENING Patient taking differently: Take 6.25-12.5 mg by mouth See admin instructions. Take 6.25 mg by mouth in the morning and 12.5 mg by mouth every evening. 10/09/21   Azalee Course, PA  Cholecalciferol (VITAMIN D3) 50 MCG (2000 UT) capsule Take 2 capsules (4,000 Units total) by mouth daily. Patient taking differently: Take 2,000 Units by mouth daily. 05/09/21   Kuneff, Renee A, DO  clotrimazole (CLOTRIMAZOLE ANTI-FUNGAL) 1 % cream Apply 1 Application topically 2 (two) times daily. Patient taking differently: Apply 1 Application topically as needed (rash under breast). 07/01/22   Kuneff, Renee A, DO  dicyclomine (BENTYL) 20 MG tablet Take 1 tablet (20 mg total) by mouth 4 (four) times daily as needed for spasms. Patient taking differently: Take 20 mg by mouth 3 (three) times daily as needed for spasms. 04/05/22   Kuneff, Renee A, DO  diphenoxylate-atropine (LOMOTIL) 2.5-0.025 MG tablet Take 1 tablet by mouth 4 (four) times daily as needed for diarrhea or loose stools. 07/25/22   Marinda Elk, MD  famotidine (PEPCID) 40 MG tablet Take 1 tablet (40 mg total) by mouth at bedtime. 04/05/22   Kuneff, Renee A, DO  fluticasone furoate-vilanterol (BREO ELLIPTA) 100-25 MCG/ACT AEPB Inhale 1 puff into the lungs daily. 08/07/22 11/05/22  Kuneff, Renee A, DO  glucose  blood (TRUE METRIX BLOOD GLUCOSE TEST) test strip Use as instructed 08/07/22   Kuneff, Renee A, DO  hydrOXYzine (ATARAX) 25 MG tablet Take 1 tablet (25 mg total) by mouth every 8 (eight) hours as needed for itching. Patient taking differently: Take 25 mg by mouth as needed for itching. 04/05/22   Kuneff, Renee A, DO  ipratropium-albuterol (DUONEB) 0.5-2.5 (3) MG/3ML SOLN Take 3 mLs by nebulization every 4 (four) hours as needed. 08/09/22   Kuneff, Renee A, DO  isosorbide mononitrate (IMDUR) 60 MG 24 hr tablet TAKE 1 TABLET(60 MG) BY MOUTH DAILY Patient taking differently: Take 60 mg by mouth daily. 10/09/21   Azalee Course, PA  metFORMIN (GLUCOPHAGE) 500 MG tablet Take 1 tablet (500 mg total) by mouth 2 (two) times daily with a meal. 08/07/22   Kuneff, Renee A, DO  nitroGLYCERIN (NITROSTAT) 0.4 MG SL tablet PLACE 1 TABLET UNDER THE TONGUE EVERY 5 MINUTES FOR 3 DOSES AS NEEDED FOR CHEST PAIN Patient taking differently: Place 0.4 mg under the tongue every 5 (five) minutes as  needed for chest pain. 08/30/21   Croitoru, Mihai, MD  psyllium (HYDROCIL/METAMUCIL) 95 % PACK Take 1 packet by mouth daily. 07/26/22   Marinda Elk, MD  rivaroxaban (XARELTO) 20 MG TABS tablet TAKE 1 TABLET(20 MG) BY MOUTH EVERY EVENING Patient taking differently: Take 20 mg by mouth daily with supper. 06/26/22   Croitoru, Mihai, MD  TRUEplus Lancets 33G MISC 1 Stick by Does not apply route daily. 08/07/22   Natalia Leatherwood, DO                                                                                                                                    Past Surgical History Past Surgical History:  Procedure Laterality Date   ABDOMINAL HYSTERECTOMY  1995   APPENDECTOMY     CATARACT EXTRACTION Bilateral    CESAREAN SECTION  1984   CHOLECYSTECTOMY  2016   COLOSTOMY     and reversal   CORONARY STENT INTERVENTION N/A 06/18/2019   Procedure: CORONARY STENT INTERVENTION;  Surgeon: Iran Ouch, MD;  Location: [facility];  Service: Cardiovascular;  Laterality: N/A;   ENDOMETRIAL ABLATION     HERNIA REPAIR  1985   1985 and 2016 (2016 ventral w/ incarceration) 33 surgeries   HYSTEROSCOPY WITH D & C     14   LAPAROSCOPIC LYSIS OF ADHESIONS     x2   LEFT HEART CATH AND CORONARY ANGIOGRAPHY N/A 06/18/2019   Procedure: LEFT HEART CATH AND CORONARY ANGIOGRAPHY;  Surgeon: Iran Ouch, MD;  Location: [facility];  Service: Cardiovascular;  Laterality: N/A;   LEFT HEART CATH AND CORONARY ANGIOGRAPHY N/A 11/30/2019   Procedure: LEFT HEART CATH AND CORONARY ANGIOGRAPHY;  Surgeon: Lennette Bihari, MD;  Location: [facility];  Service: Cardiovascular;  Laterality: N/A;   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   TONSILLECTOMY  1959   VEIN SURGERY     laser x2   Family History Family History  Problem Relation Age of Onset   Depression Mother    Drug abuse Mother    Mental illness Mother    Learning disabilities Mother    Miscarriages / [place] Mother    Alcohol abuse Mother    Mental illness Father    Depression Father    Heart disease Father    Drug abuse Sister    Depression Sister    Asthma Sister    Alcohol abuse Sister    Early death Sister    Mental illness Son    Hypertension Son    Drug abuse Son    Depression Son    Asthma Son    Alcohol abuse Son    Clotting disorder Son    Heart disease Maternal Grandmother    Diabetes Maternal Grandmother    Arthritis Maternal Grandmother    COPD Maternal Grandmother    Parkinson's disease Maternal Grandmother  Heart disease Maternal Grandfather    Heart attack Maternal Grandfather    Early death Maternal Grandfather    COPD Maternal Grandfather    Heart disease Paternal Grandmother    Heart attack Paternal Grandmother    Depression Paternal Grandmother    Arthritis Paternal Grandmother    Breast cancer Paternal Grandmother    Heart disease Paternal Grandfather    Heart attack Paternal Grandfather     Early death Paternal Grandfather    COPD Paternal Grandfather    Mental illness Half-Brother    Learning disabilities Half-Brother    Hypertension Half-Brother    Hyperlipidemia Half-Brother    Drug abuse Half-Brother    Heart disease Half-Brother    Diabetes Half-Brother    Depression Half-Brother    Asthma Half-Brother    Alcohol abuse Half-Brother    Arthritis Half-Brother    Kidney disease Half-Brother    Testicular cancer Half-Brother    Diabetes Half-Sister    Mental illness Half-Sister    Learning disabilities Half-Sister    Drug abuse Half-Sister    Depression Half-Sister    Alcohol abuse Half-Sister    Thyroid cancer Half-Sister     Social History Social History   Tobacco Use   Smoking status: Former   Smokeless tobacco: Never  Building services engineer Use: Never used  Substance Use Topics   Alcohol use: Not Currently   Drug use: Never   Allergies Rofecoxib, Metformin and related, Benadryl allergy [diphenhydramine hcl], Cephalexin, and Gatifloxacin  Review of Systems Review of Systems  All other systems reviewed and are negative.   Physical Exam Vital Signs  I have reviewed the triage vital signs BP (!) 130/91   Pulse (!) 104   Temp 97.9 F (36.6 C)   Resp 16   SpO2 97%  Physical Exam Vitals and nursing note reviewed.  Constitutional:      General: She is not in acute distress.    Appearance: She is well-developed. She is obese. She is ill-appearing.  HENT:     Head: Normocephalic and atraumatic.     Mouth/Throat:     Mouth: Mucous membranes are moist.  Eyes:     Pupils: Pupils are equal, round, and reactive to light.  Cardiovascular:     Rate and Rhythm: Normal rate and regular rhythm.     Heart sounds: No murmur heard. Pulmonary:     Comments: Mild increased work of breathing.  Trace wheezing bilaterally, no focal pulmonary findings Abdominal:     General: Abdomen is flat.     Palpations: Abdomen is soft.     Tenderness: There is no  abdominal tenderness.  Musculoskeletal:        General: No tenderness.     Right lower leg: No edema.     Left lower leg: No edema.  Skin:    General: Skin is warm and dry.  Neurological:     General: No focal deficit present.     Mental Status: She is alert. Mental status is at baseline.  Psychiatric:        Mood and Affect: Mood normal.        Behavior: Behavior normal.     ED Results and Treatments Labs (all labs ordered are listed, but only abnormal results are displayed) Labs Reviewed  RESP PANEL BY RT-PCR (RSV, FLU A&B, COVID)  RVPGX2 - Abnormal; Notable for the following components:      Result Value   Resp Syncytial Virus by PCR POSITIVE (*)  All other components within normal limits  CBC - Abnormal; Notable for the following components:   RBC 5.15 (*)    RDW 19.1 (*)    All other components within normal limits  BASIC METABOLIC PANEL - Abnormal; Notable for the following components:   Glucose, Bld 129 (*)    Creatinine, Ser 1.41 (*)    GFR, Estimated 40 (*)    All other components within normal limits  PROTIME-INR - Abnormal; Notable for the following components:   Prothrombin Time 27.7 (*)    INR 2.6 (*)    All other components within normal limits  I-STAT VENOUS BLOOD GAS, ED - Abnormal; Notable for the following components:   pO2, Ven 31 (*)    Potassium 6.1 (*)    All other components within normal limits  TROPONIN I (HIGH SENSITIVITY)  TROPONIN I (HIGH SENSITIVITY)                                                                                                                          Radiology DG Chest 2 View  Result Date: 08/12/2022 CLINICAL DATA:  Chest pain. Cough for 3 days. Woke up with chest pain and shortness of breath. Recent exposure to RSV. EXAM: CHEST - 2 VIEW COMPARISON:  Chest x-rays dated 07/20/2022 and 06/17/2019. FINDINGS: Heart size and mediastinal contours are stable. Coarse markings at the RIGHT hilum appear stable. Lungs otherwise  clear. No pleural effusion or pneumothorax is seen. Marked kyphosis of the thoracolumbar spine. No acute-appearing osseous abnormality is identified. IMPRESSION: 1. No active cardiopulmonary disease. No evidence of pneumonia or pulmonary edema. 2. Marked kyphosis of the thoracolumbar spine. Electronically Signed   By: Bary Richard M.D.   On: 08/12/2022 08:48    Pertinent labs & imaging results that were available during my care of the patient were reviewed by me and considered in my medical decision making (see MDM for details).  Medications Ordered in ED Medications  ipratropium-albuterol (DUONEB) 0.5-2.5 (3) MG/3ML nebulizer solution 3 mL (3 mLs Nebulization Given 08/12/22 0752)  methylPREDNISolone sodium succinate (SOLU-MEDROL) 125 mg/2 mL injection 125 mg (125 mg Intravenous Given 08/12/22 0833)  sodium chloride 0.9 % bolus 1,000 mL ( Intravenous Stopped 08/12/22 1047)  Procedures Procedures  (including critical care time)  Medical Decision Making / ED Course   MDM:  71 year old female presenting to the emergency department with fatigue.  Patient chronically ill-appearing.  Per son worse than typical.  Differential includes COPD exacerbation, pneumonia,, urinary tract infection, metabolic abnormality.  Doubt PE as patient compliant with her home anticoagulation.  No recent falls or signs of head injury to suggest intracranial process.  No abdominal tenderness, nausea, vomiting to suggest intra-abdominal process.  History atypical for ACS but will check troponin.  Will reassess closely.  Clinical Course as of 08/12/22 1234  Mon Aug 12, 2022  1232 Patient feels much better after treatment.  She is able to ambulate on her typical oxygen without desaturation.  Her RSV test is positive.  Labs are notable for mild AKI and she was given fluids and reports  feeling better.  VBG reassuring.  Will discharge with prednisone for COPD exacerbation as well as instructions with supportive care and strict return precautions. Will discharge patient to home. All questions answered. Patient comfortable with plan of discharge. Return precautions discussed with patient and specified on the after visit summary.  [WS]    Clinical Course User Index [WS] Lonell GrandchildScheving, Bianna Haran L, MD     Additional history obtained: -Additional history obtained from family -External records from outside source obtained and reviewed including: Chart review including previous notes, labs, imaging, consultation notes including DC summary from recent admission 07/20/22   Lab Tests: -I ordered, reviewed, and interpreted labs.   The pertinent results include:   Labs Reviewed  RESP PANEL BY RT-PCR (RSV, FLU A&B, COVID)  RVPGX2 - Abnormal; Notable for the following components:      Result Value   Resp Syncytial Virus by PCR POSITIVE (*)    All other components within normal limits  CBC - Abnormal; Notable for the following components:   RBC 5.15 (*)    RDW 19.1 (*)    All other components within normal limits  BASIC METABOLIC PANEL - Abnormal; Notable for the following components:   Glucose, Bld 129 (*)    Creatinine, Ser 1.41 (*)    GFR, Estimated 40 (*)    All other components within normal limits  PROTIME-INR - Abnormal; Notable for the following components:   Prothrombin Time 27.7 (*)    INR 2.6 (*)    All other components within normal limits  I-STAT VENOUS BLOOD GAS, ED - Abnormal; Notable for the following components:   pO2, Ven 31 (*)    Potassium 6.1 (*)    All other components within normal limits  TROPONIN I (HIGH SENSITIVITY)  TROPONIN I (HIGH SENSITIVITY)    Notable for positive RSV  EKG   EKG Interpretation  Date/Time:  Monday August 12 2022 07:39:02 EST Ventricular Rate:  91 PR Interval:  191 QRS Duration: 104 QT Interval:  356 QTC  Calculation: 438 R Axis:   -34 Text Interpretation: Sinus rhythm Low voltage, precordial leads Abnormal R-wave progression, late transition Borderline T abnormalities, anterior leads Confirmed by Alvino BloodScheving, Adewale Pucillo ([id number]) on 08/12/2022 7:42:33 AM         Imaging Studies ordered: I ordered imaging studies including CXR On my interpretation imaging demonstrates no pneumonia I independently visualized and interpreted imaging. I agree with the radiologist interpretation   Medicines ordered and prescription drug management: Meds ordered this encounter  Medications   ipratropium-albuterol (DUONEB) 0.5-2.5 (3) MG/3ML nebulizer solution 3 mL   methylPREDNISolone sodium succinate (SOLU-MEDROL) 125 mg/2 mL injection 125 mg  IV methylprednisolone will be converted to either a q12h or q24h frequency with the same total daily dose (TDD).  Ordered Dose: 1 to 125 mg TDD; convert to: TDD q24h.  Ordered Dose: 126 to 250 mg TDD; convert to: TDD div q12h.  Ordered Dose: >250 mg TDD; DAW.   sodium chloride 0.9 % bolus 1,000 mL   predniSONE (DELTASONE) 20 MG tablet    Sig: Take 3 tablets (60 mg total) by mouth daily for 5 days.    Dispense:  15 tablet    Refill:  0    -I have reviewed the patients home medicines and have made adjustments as needed  Cardiac Monitoring: The patient was maintained on a cardiac monitor.  I personally viewed and interpreted the cardiac monitored which showed an underlying rhythm of: NSR  Social Determinants of Health:  Diagnosis or treatment significantly limited by social determinants of health: obesity   Reevaluation: After the interventions noted above, I reevaluated the patient and found that they have improved  Co morbidities that complicate the patient evaluation  Past Medical History:  Diagnosis Date   Acute and chronic respiratory failure with hypoxia (HCC) 01/24/2018   Asthma    Blood in stool    Chicken pox    Chronic generalized abdominal pain     Chronic pain    CKD (chronic kidney disease), stage III (HCC)    Colon polyps    COPD (chronic obstructive pulmonary disease) (HCC)    COVID-19 2020   Depression    Diverticulitis    Very severe.  Has had 2 bowel resections and temporary colostomy in the past.   Fecal incontinence    Frequent headaches    GERD (gastroesophageal reflux disease)    Heart attack (HCC)    Heart disease    History of blood transfusion    Hyperlipemia    Hypertension    Incontinence of feces 12/11/2016   Morbid obesity (HCC)    Pneumonia due to infectious organism 01/12/2018   Postmenopausal 03/21/2021   PTSD (post-traumatic stress disorder)    Recurrent deep vein thrombosis (DVT) (HCC)    x6   Urinary incontinence       Dispostion: Disposition decision including need for hospitalization was considered, and patient discharged from emergency department.    Final Clinical Impression(s) / ED Diagnoses Final diagnoses:  RSV (respiratory syncytial virus infection)     This chart was dictated using voice recognition software.  Despite best efforts to proofread,  errors can occur which can change the documentation meaning.    Lonell Grandchild, MD 08/12/22 1234

## 2022-08-12 NOTE — Telephone Encounter (Signed)
J96.21 J44.9

## 2022-08-12 NOTE — ED Triage Notes (Addendum)
Pt brought in POV accompanied by family. Reports coughing x3 days. Woke up with Chest pain SOb. Feels very weak..Recently exposed to RSV. Normally wears o2 at 3 Lvia Panacea. No O2 in place on arrival

## 2022-08-12 NOTE — Telephone Encounter (Signed)
Pharmacy wont fill prescription - states it is requiring diag code or something.  Patient isnt really sure.  She was diagnosed with RSV and is not felling well at all.  Please call pharmacy to verify why they can't fill med.

## 2022-08-12 NOTE — Discharge Instructions (Addendum)
We evaluated you for your shortness of breath.  Your RSV test was positive.  Your symptoms are likely due to infection with RSV.  Please take 1000 mg of Tylenol every 6 hours as needed for body aches or fevers.  Due to your history of COPD, we have given you steroids to take for the next 5 days.  Please be sure to stay hydrated.  Please follow-up closely with your primary doctor.  Please return if you develop any worsening symptoms, difficulty breathing, severe nausea or vomiting, severe chest pain, productive cough, or any other concerning symptoms.

## 2022-08-12 NOTE — ED Notes (Signed)
Patient transported to X-ray 

## 2022-08-13 ENCOUNTER — Ambulatory Visit: Payer: Medicare Other | Admitting: Physical Therapy

## 2022-08-13 NOTE — Telephone Encounter (Signed)
Attempted to call but was placed on hold for an extended time will cb at later time.

## 2022-08-13 NOTE — Telephone Encounter (Signed)
Called pharmacy who stated they have received script.

## 2022-08-16 ENCOUNTER — Ambulatory Visit: Payer: Medicare Other

## 2022-08-23 ENCOUNTER — Ambulatory Visit: Payer: Medicare Other | Admitting: Physical Therapy

## 2022-08-28 ENCOUNTER — Ambulatory Visit: Payer: Medicare Other | Admitting: Physical Therapy

## 2022-08-30 ENCOUNTER — Ambulatory Visit: Payer: Medicare Other | Attending: Internal Medicine | Admitting: Physical Therapy

## 2022-08-30 ENCOUNTER — Encounter: Payer: Self-pay | Admitting: Physical Therapy

## 2022-08-30 DIAGNOSIS — M79605 Pain in left leg: Secondary | ICD-10-CM | POA: Insufficient documentation

## 2022-08-30 DIAGNOSIS — R2689 Other abnormalities of gait and mobility: Secondary | ICD-10-CM | POA: Insufficient documentation

## 2022-08-30 DIAGNOSIS — R2681 Unsteadiness on feet: Secondary | ICD-10-CM | POA: Insufficient documentation

## 2022-08-30 DIAGNOSIS — M6281 Muscle weakness (generalized): Secondary | ICD-10-CM | POA: Insufficient documentation

## 2022-08-30 DIAGNOSIS — M79604 Pain in right leg: Secondary | ICD-10-CM | POA: Diagnosis present

## 2022-08-30 NOTE — Therapy (Signed)
OUTPATIENT PHYSICAL THERAPY LOWER EXTREMITY EVALUATION   Patient Name: Mary Ferguson MRN: [id number] DOB:1950/10/02, 72 y.o., female Today's Date: 08/30/2022  END OF SESSION:  PT End of Session - 08/30/22 0804     Visit Number 2    Date for PT Re-Evaluation 09/26/22    Authorization Type Medicare and BCBS Supplement    PT Start Time 0804    PT Stop Time 0851    PT Time Calculation (min) 47 min    Activity Tolerance Patient tolerated treatment well;Patient limited by fatigue;Patient limited by pain    Behavior During Therapy [facility] for tasks assessed/performed             Past Medical History:  Diagnosis Date   Acute and chronic respiratory failure with hypoxia (HCC) 01/24/2018   Asthma    Blood in stool    Chicken pox    Chronic generalized abdominal pain    Chronic pain    CKD (chronic kidney disease), stage III (HCC)    Colon polyps    COPD (chronic obstructive pulmonary disease) (HCC)    COVID-19 2020   Depression    Diverticulitis    Very severe.  Has had 2 bowel resections and temporary colostomy in the past.   Fecal incontinence    Frequent headaches    GERD (gastroesophageal reflux disease)    Heart attack (HCC)    Heart disease    History of blood transfusion    Hyperlipemia    Hypertension    Incontinence of feces 12/11/2016   Morbid obesity (HCC)    Pneumonia due to infectious organism 01/12/2018   Postmenopausal 03/21/2021   PTSD (post-traumatic stress disorder)    Recurrent deep vein thrombosis (DVT) (HCC)    x6   Urinary incontinence    Past Surgical History:  Procedure Laterality Date   ABDOMINAL HYSTERECTOMY  1995   APPENDECTOMY     CATARACT EXTRACTION Bilateral    CESAREAN SECTION  1984   CHOLECYSTECTOMY  2016   COLOSTOMY     and reversal   CORONARY STENT INTERVENTION N/A 06/18/2019   Procedure: CORONARY STENT INTERVENTION;  Surgeon: Iran Ouch, MD;  Location: [facility];  Service: Cardiovascular;  Laterality: N/A;    ENDOMETRIAL ABLATION     HERNIA REPAIR  1985   1985 and 2016 (2016 ventral w/ incarceration) 33 surgeries   HYSTEROSCOPY WITH D & C     13   LAPAROSCOPIC LYSIS OF ADHESIONS     x2   LEFT HEART CATH AND CORONARY ANGIOGRAPHY N/A 06/18/2019   Procedure: LEFT HEART CATH AND CORONARY ANGIOGRAPHY;  Surgeon: Iran Ouch, MD;  Location: [facility];  Service: Cardiovascular;  Laterality: N/A;   LEFT HEART CATH AND CORONARY ANGIOGRAPHY N/A 11/30/2019   Procedure: LEFT HEART CATH AND CORONARY ANGIOGRAPHY;  Surgeon: Lennette Bihari, MD;  Location: [facility];  Service: Cardiovascular;  Laterality: N/A;   NASAL RECONSTRUCTION  12/24/51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   TONSILLECTOMY  1959   VEIN SURGERY     laser x2   Patient Active Problem List   Diagnosis Date Noted   Rhabdomyolysis 07/21/2022   Acute kidney injury superimposed on chronic kidney disease (HCC) 07/21/2022   Fall at home, initial encounter 07/21/2022   High anion gap metabolic acidosis 07/21/2022   Elevated liver enzymes 07/21/2022   Acute diverticulitis 07/20/2022   Chronic venous insufficiency 01/08/2022   Irritable bowel syndrome with diarrhea 06/21/2021   Imdur therapy-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) 03/22/2021   Compression fracture of L1 lumbar vertebra (HCC) 03/22/2021   Lumbar radiculopathy 03/22/2021   Neuropathy 03/22/2021   Paresthesia of bilateral legs 03/22/2021   Aortic atherosclerosis (HCC) 03/22/2021   Type II diabetes mellitus with manifestations (HCC) 03/21/2021   COPD (chronic obstructive pulmonary disease) (HCC) 03/21/2021   CAD S/P PCI 06/30/2019   Dyslipidemia, goal LDL below 70 06/30/2019   Morbid obesity (HCC)    Essential hypertension    History of non-ST elevation myocardial infarction (NSTEMI) 06/17/2019   Acute on chronic respiratory failure with hypoxemia (HCC) 01/24/2018   Mild episode of recurrent major depressive disorder (HCC) 03/25/2017   CKD (chronic kidney  disease) stage 3, GFR 30-59 ml/min (HCC) 03/19/2017   Vitamin D deficiency 03/19/2017   B12 deficiency 03/19/2017   Nocturnal oxygen desaturation 12/11/2016   BMI 45.0-49.9, adult (HCC) 12/11/2016   Ambulates with cane 12/11/2016   Chronic pruritic rash in adult 07/08/2016   Chronic pain syndrome 03/13/2015   Abdominal wall hernia 01/27/2014   Obstructive sleep apnea (adult) (pediatric) 08/05/2013   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HCC) 02/18/2013   Moderate persistent asthma 12/30/2012   History of recurrent deep vein thrombosis (DVT) 12/30/2012    PCP: Natalia Leatherwood, DO  REFERRING PROVIDER: Calvert Cantor, MD  REFERRING DIAG: [phone] (ICD-10-CM) - Rhabdomyolysis   THERAPY DIAG:  Unsteadiness on feet  Other abnormalities of gait and mobility  Muscle weakness (generalized)  Pain in both lower extremities  RATIONALE FOR EVALUATION AND TREATMENT: Rehabilitation  ONSET DATE: July 18, 2022  NEXT MD VISIT: 09/09/22 with PCP   SUBJECTIVE:   SUBJECTIVE STATEMENT: Pt returning for the first time since her eval on 08/01/22 due to prolonged respiratory illness (RSV). She reports 1 new fall since requiring her dtr-in-law's assist to get up - at the time she felt like she was unable to move her legs while she was on the floor. She states she is supposed to be on O2 full time but states it prevents her from moving around the home - encouraged her to f/u with her medical supply provider re: extended lengths of O2 tubing to allow her to be more mobile while remaining on the O2.  PAIN:  Are you having pain? Yes: NPRS scale:  8/10 Pain location: Low back & abdomen (from coughing) Pain description: stabbing, stinging Aggravating factors: everything Relieving factors: ibuprofen or Tylenol as needed   PERTINENT HISTORY: Asthma, Stage III CKD, COPD, Depression, GERD, Heart Disease (Coronary Stent, Ablation, Heart Cath), Lumbar radiculopathy, Obesity, Type II DM, Recurrent DVT    PRECAUTIONS: Fall  WEIGHT BEARING RESTRICTIONS: No  FALLS:  Has patient fallen in last 6 months? Yes. Number of falls 4-6  LIVING ENVIRONMENT: Lives with: lives with their son and daughter-in-law Lives in: House/apartment Stairs: Yes: External: uses a ramp that was built steps; none Has following equipment at home: Single point cane, Environmental consultant - 4 wheeled, Tour manager, Grab bars, and Ramped entry  OCCUPATION: does not work  PLOF: Needs assistance with ADLs and Needs assistance with transfers  PATIENT GOALS: "improve balance and stability"   OBJECTIVE:   DIAGNOSTIC FINDINGS:  07/22/22 MR Thoracic Spine w/wo Contrast IMPRESSION: 1. No acute findings or explanation for the patient's symptoms. 2. Chronic superior endplate compression deformity at L1. No acute osseous findings. 3. Mild thoracic spondylosis without cord deformity or high-grade spinal stenosis. 4. 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 5. Mild  disc bulging and facet hypertrophy at L4-5 contributing to mild spinal stenosis with mild lateral recess and foraminal narrowing bilaterally.  07/22/22 MR Lumbar Spine w/wo Contrast IMPRESSION: 1. No acute findings or explanation for the patient's symptoms. 2. Chronic superior endplate compression deformity at L1. No acute osseous findings. 3. Mild thoracic spondylosis without cord deformity or high-grade spinal stenosis. 4. 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 5. Mild disc bulging and facet hypertrophy at L4-5 contributing to mild spinal stenosis with mild lateral recess and foraminal narrowing bilaterally.  PATIENT SURVEYS:  ABC scale 60 / 1600 = 3.8 %  COGNITION: Overall cognitive status: Impaired and increased response time and delay with answer questions, occasional stuttering       SENSATION: Light touch: Impaired  Proprioception: Impaired   EDEMA:  NT  MUSCLE LENGTH: NT  POSTURE: rounded shoulders, forward head, and increased thoracic kyphosis  PALPATION: NT  LOWER EXTREMITY ROM:  Grossly limited by general observation  LOWER EXTREMITY MMT: MMT increased back pain  MMT Right eval Left eval  Hip flexion 3 2+  Hip extension    Hip abduction 4-* 4-*  Hip adduction 4-* 4-*  Hip internal rotation 3+ 3+  Hip external rotation 3+ 3+  Knee flexion 4- 4-  Knee extension 4- 4-  Ankle dorsiflexion 4- 4-  Ankle plantarflexion    Ankle inversion    Ankle eversion     (Blank rows = not tested, *=modified to sitting position for testing)  LOWER EXTREMITY SPECIAL TESTS:  NT  FUNCTIONAL TESTS: *to be assessed on next visit* 5 times sit to stand: 34.69 sec with B UE assist 10 meter walk test: 76.25 sec with rollator Berg Balance Scale: 21/56, < 36 high risk for falls (close to 100%)   (08/29/22) Gait speed: 0.43 ft/sec with rollator  GAIT: Distance walked: 90 Assistive device utilized: Environmental consultant - 4 wheeled Level of assistance: Modified independence Comments: decreased BLE, significantly decreased gait speed, decreased stride/step length BLE, forward flexed posture   TODAY'S TREATMENT:                                                                                                                              DATE:   08/30/22 O2 sats: 95-96% on room air  THERAPEUTIC ACTIVITIES: Berg = 21/56; < 36 high risk for falls (close to 100%)  5xSTS = 34.69 sec with B UE assist Training in safe use of rollator, including proper use of brakes during transfers, height adjustment, posture and rollator proximity while walking and need to use rollator all the way to her destination when preparing to sit.  SELF CARE:  Discussion on use of home O2   08/01/22 Initial eval Education and demonstration on safe use of Rollator during transfers and  walking Demonstrated Sit<>stand x2 with safe techniques- cues to lock brakes, stand up use chair, and close proximity to rollator.  Safety  adjustments were made by therapist on rollator with brake cables and increased height of rollator   PATIENT EDUCATION:  Education details: PT eval findings, anticipated POC, need for further assessment of balance and mobility, transfer safety, and gait safety with rollator   Person educated: Patient Education method: Explanation, Demonstration, Tactile cues, and Verbal cues Education comprehension: verbalized understanding, returned demonstration, verbal cues required, tactile cues required, and needs further education  HOME EXERCISE PROGRAM:   ASSESSMENT:  CLINICAL IMPRESSION: Malorie returning for the first time since her initial eval on 08/01/22 due to prolonged respiratory infection from RSV. She continues to note significant LBP as well as increased abdominal pain from coughing which has aggravated her hernias. Standardized balance testing completed indicating a very high risk for falls on all testing, with pt reporting a new fall since her last PT visit. Rollator safety education provided to reduce risk for falls during transfers and gait. Will plan to initiate stretching and strengthening exercises to address LBP and LE pain next visit, eventually progressing to balance activities as tolerated.  OBJECTIVE IMPAIRMENTS: Abnormal gait, cardiopulmonary status limiting activity, decreased activity tolerance, decreased balance, decreased cognition, decreased coordination, decreased endurance, decreased knowledge of condition, decreased knowledge of use of DME, decreased mobility, difficulty walking, decreased ROM, decreased strength, decreased safety awareness, dizziness, hypomobility, increased edema, increased fascial restrictions, impaired perceived functional ability, impaired flexibility, impaired sensation, impaired tone, postural dysfunction, and pain.    ACTIVITY LIMITATIONS: carrying, lifting, bending, sitting, standing, squatting, stairs, transfers, bed mobility, bathing, toileting, dressing, and locomotion level  PARTICIPATION LIMITATIONS: meal prep, cleaning, laundry, medication management, shopping, and community activity  PERSONAL FACTORS: Age, Education, Past/current experiences, Time since onset of injury/illness/exacerbation, and 3+ comorbidities: Asthma, Stage III CKD, COPD, Depression, GERD, Heart Disease (Coronary Stent, Ablation, Heart Cath), Lumbar radiculopathy, Obesity, Type II DM, Recurrent DVT  are also affecting patient's functional outcome.   REHAB POTENTIAL: Good  CLINICAL DECISION MAKING: Evolving/moderate complexity  EVALUATION COMPLEXITY: Moderate   GOALS: Goals reviewed with patient? Yes  SHORT TERM GOALS: Target date: 08/29/22, extended to 09/13/2022 due prolonged absence from PT   Pt will be independent with initial HEP.  Baseline: Goal status: IN PROGRESS  2.  Pt will demonstrate safe techniques during transfers and gait using the rollator without needed cueing.  Baseline:  Goal status: IN PROGRESS  LONG TERM GOALS: Target date: 09/26/22  Pt will be independent with advanced/ongoing HEP to improve outcomes/carryover.  Baseline:  Goal status: IN PROGRESS  2.  Pt will report at least 22% balance confidence on ABC scale to demonstrate improved confidence with functional mobility.   Baseline: 60 / 1600 = 3.8 % (MDIC 19%) Goal status: IN PROGRESS  3.  Pt will improve Berg Balance Score by 8 points to decrease risk of falls.  Baseline: 21/56 Goal status: IN PROGRESS  4.  Pt will demonstrate consistent safe ambulation and transfers techniques using Rollator to improve safety around the home and in the community.  Baseline:  Goal status: IN PROGRESS  5.  Pt will amb >300 feet with normal gait pattern and LRAD to complete daily tasks around the home with increased endurance.  Baseline:  Goal status: IN  PROGRESS  6.  Pt will improve 5x STS time to </= 20 seconds to demonstrate improved functional strength and transfer efficiency.  Baseline: 34.69 sec with B UE assist Goal status: IN PROGRESS  7.  Pt will improve gait speed to >1.3 ft/sec to improve household ambulation with decreased fall risk.  Baseline: 0.43 ft/sec with rollator Goal status: IN PROGRESS  8.  Pt will report 50% improvement in low back and LE pain to improve QOL.  Baseline:  Goal status: IN PROGRESS    PLAN:  PT FREQUENCY: 2x/week  PT DURATION: 8 weeks  PLANNED INTERVENTIONS: Therapeutic exercises, Therapeutic activity, Neuromuscular re-education, Balance training, Gait training, Patient/Family education, Self Care, Joint mobilization, DME instructions, Aquatic Therapy, Dry Needling, Electrical stimulation, Cryotherapy, Moist heat, Compression bandaging, Vasopneumatic device, Ultrasound, Ionotophoresis 4mg /ml Dexamethasone, Manual therapy, and Re-evaluation  PLAN FOR NEXT SESSION: vital sign check throughout session, handout on safety in the home/DME, initial HEP - general low back and LE ROM/strengthening exercises, static and dynamic balance intervention    Percival Spanish, PT 08/30/2022, 12:04 PM

## 2022-09-02 ENCOUNTER — Ambulatory Visit: Payer: Medicare Other

## 2022-09-05 ENCOUNTER — Ambulatory Visit: Payer: Medicare Other | Admitting: Physical Therapy

## 2022-09-05 DIAGNOSIS — R2681 Unsteadiness on feet: Secondary | ICD-10-CM

## 2022-09-05 DIAGNOSIS — M6281 Muscle weakness (generalized): Secondary | ICD-10-CM

## 2022-09-05 DIAGNOSIS — R2689 Other abnormalities of gait and mobility: Secondary | ICD-10-CM

## 2022-09-05 DIAGNOSIS — M79604 Pain in right leg: Secondary | ICD-10-CM

## 2022-09-05 NOTE — Therapy (Signed)
OUTPATIENT PHYSICAL THERAPY LOWER EXTREMITY EVALUATION   Patient Name: Rhenda [place] MRN: [id number] DOB:01-Nov-1950, 72 y.o., female Today's Date: 09/05/2022  END OF SESSION:  PT End of Session - 09/05/22 1528     Visit Number 3    Date for PT Re-Evaluation 09/26/22    Authorization Type Medicare and BCBS Supplement    PT Start Time 1528    PT Stop Time 1615    PT Time Calculation (min) 47 min    Activity Tolerance Patient tolerated treatment well;Patient limited by fatigue;Patient limited by pain    Behavior During Therapy [facility] for tasks assessed/performed              Past Medical History:  Diagnosis Date   Acute and chronic respiratory failure with hypoxia (HCC) 01/24/2018   Asthma    Blood in stool    Chicken pox    Chronic generalized abdominal pain    Chronic pain    CKD (chronic kidney disease), stage III (HCC)    Colon polyps    COPD (chronic obstructive pulmonary disease) (HCC)    COVID-19 2020   Depression    Diverticulitis    Very severe.  Has had 2 bowel resections and temporary colostomy in the past.   Fecal incontinence    Frequent headaches    GERD (gastroesophageal reflux disease)    Heart attack (HCC)    Heart disease    History of blood transfusion    Hyperlipemia    Hypertension    Incontinence of feces 12/11/2016   Morbid obesity (HCC)    Pneumonia due to infectious organism 01/12/2018   Postmenopausal 03/21/2021   PTSD (post-traumatic stress disorder)    Recurrent deep vein thrombosis (DVT) (HCC)    x6   Urinary incontinence    Past Surgical History:  Procedure Laterality Date   ABDOMINAL HYSTERECTOMY  1995   APPENDECTOMY     CATARACT EXTRACTION Bilateral    CESAREAN SECTION  1984   CHOLECYSTECTOMY  2016   COLOSTOMY     and reversal   CORONARY STENT INTERVENTION N/A 06/18/2019   Procedure: CORONARY STENT INTERVENTION;  Surgeon: Iran Ouch, MD;  Location: [facility];  Service: Cardiovascular;  Laterality: N/A;    ENDOMETRIAL ABLATION     HERNIA REPAIR  1985   1985 and 2016 (2016 ventral w/ incarceration) 33 surgeries   HYSTEROSCOPY WITH D & C     13   LAPAROSCOPIC LYSIS OF ADHESIONS     x2   LEFT HEART CATH AND CORONARY ANGIOGRAPHY N/A 06/18/2019   Procedure: LEFT HEART CATH AND CORONARY ANGIOGRAPHY;  Surgeon: Iran Ouch, MD;  Location: [facility];  Service: Cardiovascular;  Laterality: N/A;   LEFT HEART CATH AND CORONARY ANGIOGRAPHY N/A 11/30/2019   Procedure: LEFT HEART CATH AND CORONARY ANGIOGRAPHY;  Surgeon: Lennette Bihari, MD;  Location: [facility];  Service: Cardiovascular;  Laterality: N/A;   NASAL RECONSTRUCTION  Dec 27, 1950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   TONSILLECTOMY  1959   VEIN SURGERY     laser x2   Patient Active Problem List   Diagnosis Date Noted   Rhabdomyolysis 07/21/2022   Acute kidney injury superimposed on chronic kidney disease (HCC) 07/21/2022   Fall at home, initial encounter 07/21/2022   High anion gap metabolic acidosis 07/21/2022   Elevated liver enzymes 07/21/2022   Acute diverticulitis 07/20/2022   Chronic venous insufficiency 01/08/2022   Irritable bowel syndrome with diarrhea 06/21/2021   Imdur therapy-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) 03/22/2021   Compression fracture of L1 lumbar vertebra (HCC) 03/22/2021   Lumbar radiculopathy 03/22/2021   Neuropathy 03/22/2021   Paresthesia of bilateral legs 03/22/2021   Aortic atherosclerosis (HCC) 03/22/2021   Type II diabetes mellitus with manifestations (HCC) 03/21/2021   COPD (chronic obstructive pulmonary disease) (HCC) 03/21/2021   CAD S/P PCI 06/30/2019   Dyslipidemia, goal LDL below 70 06/30/2019   Morbid obesity (HCC)    Essential hypertension    History of non-ST elevation myocardial infarction (NSTEMI) 06/17/2019   Acute on chronic respiratory failure with hypoxemia (HCC) 01/24/2018   Mild episode of recurrent major depressive disorder (HCC) 03/25/2017   CKD (chronic kidney  disease) stage 3, GFR 30-59 ml/min (HCC) 03/19/2017   Vitamin D deficiency 03/19/2017   B12 deficiency 03/19/2017   Nocturnal oxygen desaturation 12/11/2016   BMI 45.0-49.9, adult (HCC) 12/11/2016   Ambulates with cane 12/11/2016   Chronic pruritic rash in adult 07/08/2016   Chronic pain syndrome 03/13/2015   Abdominal wall hernia 01/27/2014   Obstructive sleep apnea (adult) (pediatric) 08/05/2013   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HCC) 02/18/2013   Moderate persistent asthma 12/30/2012   History of recurrent deep vein thrombosis (DVT) 12/30/2012    PCP: Natalia Leatherwood, DO  REFERRING PROVIDER: Calvert Cantor, MD  REFERRING DIAG: [phone] (ICD-10-CM) - Rhabdomyolysis   THERAPY DIAG:  Unsteadiness on feet  Other abnormalities of gait and mobility  Muscle weakness (generalized)  Pain in both lower extremities  RATIONALE FOR EVALUATION AND TREATMENT: Rehabilitation  ONSET DATE: July 18, 2022  NEXT MD VISIT: 09/09/22 with PCP   SUBJECTIVE:   SUBJECTIVE STATEMENT: Pt reports her son was not able to help her obtain the O2 tubing extension that we discussed last visit, so she continues to not use her O2 at home. She reports her pain seems to have been worse with the inclement weather this week.  PAIN:  Are you having pain? Yes: NPRS scale:  7/10 Pain location: Low back & abdomen (from coughing) Pain description: stabbing, stinging Aggravating factors: everything Relieving factors: ibuprofen or Tylenol as needed   PERTINENT HISTORY: Asthma, Stage III CKD, COPD, Depression, GERD, Heart Disease (Coronary Stent, Ablation, Heart Cath), Lumbar radiculopathy, Obesity, Type II DM, Recurrent DVT   PRECAUTIONS: Fall  WEIGHT BEARING RESTRICTIONS: No  FALLS:  Has patient fallen in last 6 months? Yes. Number of falls 4-6  LIVING ENVIRONMENT: Lives with: lives with their son and daughter-in-law Lives in: House/apartment Stairs: Yes: External: uses a ramp that was built steps;  none Has following equipment at home: Single point cane, Environmental consultant - 4 wheeled, Tour manager, Grab bars, and Ramped entry  OCCUPATION: does not work  PLOF: Needs assistance with ADLs and Needs assistance with transfers  PATIENT GOALS: "improve balance and stability"   OBJECTIVE:   DIAGNOSTIC FINDINGS:  07/22/22 MR Thoracic Spine w/wo Contrast IMPRESSION: 1. No acute findings or explanation for the patient's symptoms. 2. Chronic superior endplate compression deformity at L1. No acute osseous findings. 3. Mild thoracic spondylosis without cord deformity or high-grade spinal stenosis. 4. 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 5. Mild disc bulging and facet hypertrophy at L4-5 contributing to mild spinal stenosis with mild lateral recess and foraminal narrowing bilaterally.  07/22/22 MR Lumbar Spine w/wo Contrast IMPRESSION: 1. No acute findings or explanation for the patient's symptoms. 2. Chronic superior endplate compression deformity at L1. No acute osseous findings. 3. Mild thoracic  spondylosis without cord deformity or high-grade spinal stenosis. 4. 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 5. Mild disc bulging and facet hypertrophy at L4-5 contributing to mild spinal stenosis with mild lateral recess and foraminal narrowing bilaterally.  PATIENT SURVEYS:  ABC scale 60 / 1600 = 3.8 %  COGNITION: Overall cognitive status: Impaired and increased response time and delay with answer questions, occasional stuttering      SENSATION: Light touch: Impaired  Proprioception: Impaired   EDEMA:  NT  MUSCLE LENGTH: NT  POSTURE: rounded shoulders, forward head, and increased thoracic kyphosis  PALPATION: NT  LOWER EXTREMITY ROM:  Grossly limited by general observation  LOWER EXTREMITY MMT: MMT increased back  pain  MMT Right eval Left eval  Hip flexion 3 2+  Hip extension    Hip abduction 4-* 4-*  Hip adduction 4-* 4-*  Hip internal rotation 3+ 3+  Hip external rotation 3+ 3+  Knee flexion 4- 4-  Knee extension 4- 4-  Ankle dorsiflexion 4- 4-  Ankle plantarflexion    Ankle inversion    Ankle eversion     (Blank rows = not tested, *=modified to sitting position for testing)  LOWER EXTREMITY SPECIAL TESTS:  NT  FUNCTIONAL TESTS: *to be assessed on next visit* 5 times sit to stand: 34.69 sec with B UE assist 10 meter walk test: 76.25 sec with rollator Berg Balance Scale: 21/56, < 36 high risk for falls (close to 100%)   (08/29/22) Gait speed: 0.43 ft/sec with rollator  GAIT: Distance walked: 90 Assistive device utilized: Environmental consultant - 4 wheeled Level of assistance: Modified independence Comments: decreased BLE, significantly decreased gait speed, decreased stride/step length BLE, forward flexed posture   TODAY'S TREATMENT:                                                                                                                              DATE:   09/05/22 THERAPEUTIC ACTIVITIES:  Reviewed rollator safety with proper positioning of rollator when preparing to sit as well as safe hand placement and proper use of brakes during transfers.  THERAPEUTIC EXERCISE: to improve flexibility, strength and mobility.  Verbal and tactile cues throughout for technique.  NuStep - L5 x 6 min; O2 sats after warmup: 99%, HR = 88 Hooklying LTR x 10 Hooklying L/R HS stretch with strap 10 x 10" Hooklying TrA isometric 1 x 5" Seated TrA isometric 5 x 5" Seated TrA + alt march x 5   08/30/22 O2 sats: 95-96% on room air  THERAPEUTIC ACTIVITIES: Berg = 21/56; < 36 high risk for falls (close to 100%)  5xSTS = 34.69 sec with B UE assist Training in safe use of rollator, including proper use of brakes during transfers, height adjustment, posture and rollator proximity while walking and need to use  rollator all the way to her destination when preparing to sit.  SELF CARE:  Discussion on  use of home O2   08/01/22 Initial eval Education and demonstration on safe use of Rollator during transfers and walking Demonstrated Sit<>stand x2 with safe techniques- cues to lock brakes, stand up use chair, and close proximity to rollator.  Safety adjustments were made by therapist on rollator with brake cables and increased height of rollator   PATIENT EDUCATION:  Education details: initial HEP, transfer safety, and gait safety with rollator   Person educated: Patient Education method: Explanation, Demonstration, and Verbal cues Education comprehension: verbalized understanding, returned demonstration, verbal cues required, and needs further education  HOME EXERCISE PROGRAM: Access Code: [email or web address] URL: [email or web address] Date: 09/05/2022 Prepared by: Glenetta Hew  Exercises - Seated Transversus Abdominis Bracing  - 1 x daily - 7 x weekly - 2 sets - 5 reps - 5 sec hold - Seated March  - 1 x daily - 7 x weekly - 2 sets - 5 reps - 3 sec hold - Seated Single Leg Hip Abduction  - 1 x daily - 7 x weekly - 2 sets - 5 reps - 3 sec hold  ASSESSMENT:  CLINICAL IMPRESSION: Avarie continues to demonstrate decreased safety awareness with use of rollator, therefore reviewed safety with transfers including placement of walker, hand placement during transfers and proper use of brakes.  She continues to report high levels of low back pain, therefore attempted to initiate flexibility and strengthening exercises to help reduce muscle tension and provide improve muscular support for low back.  Patient experiencing spinning dizziness associated with vertigo during sit to/from supine transfers (worse when laying down) and reported limited tolerance for exercises in supine/hook lying, therefore initial HEP focusing on seated exercises and will modify as indicated in upcoming visits.  OBJECTIVE  IMPAIRMENTS: Abnormal gait, cardiopulmonary status limiting activity, decreased activity tolerance, decreased balance, decreased cognition, decreased coordination, decreased endurance, decreased knowledge of condition, decreased knowledge of use of DME, decreased mobility, difficulty walking, decreased ROM, decreased strength, decreased safety awareness, dizziness, hypomobility, increased edema, increased fascial restrictions, impaired perceived functional ability, impaired flexibility, impaired sensation, impaired tone, postural dysfunction, and pain.   ACTIVITY LIMITATIONS: carrying, lifting, bending, sitting, standing, squatting, stairs, transfers, bed mobility, bathing, toileting, dressing, and locomotion level  PARTICIPATION LIMITATIONS: meal prep, cleaning, laundry, medication management, shopping, and community activity  PERSONAL FACTORS: Age, Education, Past/current experiences, Time since onset of injury/illness/exacerbation, and 3+ comorbidities: Asthma, Stage III CKD, COPD, Depression, GERD, Heart Disease (Coronary Stent, Ablation, Heart Cath), Lumbar radiculopathy, Obesity, Type II DM, Recurrent DVT  are also affecting patient's functional outcome.   REHAB POTENTIAL: Good  CLINICAL DECISION MAKING: Evolving/moderate complexity  EVALUATION COMPLEXITY: Moderate   GOALS: Goals reviewed with patient? Yes  SHORT TERM GOALS: Target date: 08/29/22, extended to 09/13/2022 due prolonged absence from PT   Pt will be independent with initial HEP.  Baseline: Goal status: IN PROGRESS  2.  Pt will demonstrate safe techniques during transfers and gait using the rollator without needed cueing.  Baseline:  Goal status: IN PROGRESS  LONG TERM GOALS: Target date: 09/26/22  Pt will be independent with advanced/ongoing HEP to improve outcomes/carryover.  Baseline:  Goal status: IN PROGRESS  2.  Pt will report at least 22% balance confidence on ABC scale to demonstrate improved confidence with  functional mobility.   Baseline: 60 / 1600 = 3.8 % (MDIC 19%) Goal status: IN PROGRESS  3.  Pt will improve Berg Balance Score by 8 points to decrease risk of falls.  Baseline: 21/56 Goal status: IN PROGRESS  4.  Pt will demonstrate consistent safe ambulation and transfers techniques using Rollator to improve safety around the home and in the community.  Baseline:  Goal status: IN PROGRESS  5.  Pt will amb >300 feet with normal gait pattern and LRAD to complete daily tasks around the home with increased endurance.  Baseline:  Goal status: IN PROGRESS  6.  Pt will improve 5x STS time to </= 20 seconds to demonstrate improved functional strength and transfer efficiency.  Baseline: 34.69 sec with B UE assist Goal status: IN PROGRESS  7.  Pt will improve gait speed to >1.3 ft/sec to improve household ambulation with decreased fall risk.  Baseline: 0.43 ft/sec with rollator Goal status: IN PROGRESS  8.  Pt will report 50% improvement in low back and LE pain to improve QOL.  Baseline:  Goal status: IN PROGRESS    PLAN:  PT FREQUENCY: 2x/week  PT DURATION: 8 weeks  PLANNED INTERVENTIONS: Therapeutic exercises, Therapeutic activity, Neuromuscular re-education, Balance training, Gait training, Patient/Family education, Self Care, Joint mobilization, DME instructions, Aquatic Therapy, Dry Needling, Electrical stimulation, Cryotherapy, Moist heat, Compression bandaging, Vasopneumatic device, Ultrasound, Ionotophoresis 4mg /ml Dexamethasone, Manual therapy, and Re-evaluation  PLAN FOR NEXT SESSION: vital sign check throughout session, handout on safety in the home/DME, progress general low back and LE ROM/strengthening exercises as tolerated - review/update HEP as indicated, static and dynamic balance intervention    Percival Spanish, PT 09/05/2022, 6:53 PM

## 2022-09-09 ENCOUNTER — Ambulatory Visit: Payer: Medicare Other | Admitting: Family Medicine

## 2022-09-09 ENCOUNTER — Encounter: Payer: Medicare Other | Admitting: Physical Therapy

## 2022-09-10 ENCOUNTER — Encounter: Payer: Medicare Other | Admitting: Physical Therapy

## 2022-09-12 ENCOUNTER — Ambulatory Visit: Payer: Medicare Other

## 2022-09-12 DIAGNOSIS — R2689 Other abnormalities of gait and mobility: Secondary | ICD-10-CM

## 2022-09-12 DIAGNOSIS — R2681 Unsteadiness on feet: Secondary | ICD-10-CM

## 2022-09-12 DIAGNOSIS — M79604 Pain in right leg: Secondary | ICD-10-CM

## 2022-09-12 DIAGNOSIS — M79605 Pain in left leg: Secondary | ICD-10-CM

## 2022-09-12 DIAGNOSIS — M6281 Muscle weakness (generalized): Secondary | ICD-10-CM

## 2022-09-12 NOTE — Therapy (Signed)
OUTPATIENT PHYSICAL THERAPY TREATMENT   Patient Name: Mary Ferguson MRN: [id number] DOB:03-27-1951, 72 y.o., female Today's Date: 09/12/2022  END OF SESSION:  PT End of Session - 09/12/22 1704     Visit Number 4    Date for PT Re-Evaluation 09/26/22    Authorization Type Medicare and BCBS Supplement    PT Start Time 1619    PT Stop Time 1700    PT Time Calculation (min) 41 min    Activity Tolerance Patient tolerated treatment well;Patient limited by fatigue;Patient limited by pain    Behavior During Therapy [facility] for tasks assessed/performed               Past Medical History:  Diagnosis Date   Acute and chronic respiratory failure with hypoxia (HCC) 01/24/2018   Asthma    Blood in stool    Chicken pox    Chronic generalized abdominal pain    Chronic pain    CKD (chronic kidney disease), stage III (HCC)    Colon polyps    COPD (chronic obstructive pulmonary disease) (HCC)    COVID-19 2020   Depression    Diverticulitis    Very severe.  Has had 2 bowel resections and temporary colostomy in the past.   Fecal incontinence    Frequent headaches    GERD (gastroesophageal reflux disease)    Heart attack (HCC)    Heart disease    History of blood transfusion    Hyperlipemia    Hypertension    Incontinence of feces 12/11/2016   Morbid obesity (HCC)    Pneumonia due to infectious organism 01/12/2018   Postmenopausal 03/21/2021   PTSD (post-traumatic stress disorder)    Recurrent deep vein thrombosis (DVT) (HCC)    x6   Urinary incontinence    Past Surgical History:  Procedure Laterality Date   ABDOMINAL HYSTERECTOMY  1995   APPENDECTOMY     CATARACT EXTRACTION Bilateral    CESAREAN SECTION  1984   CHOLECYSTECTOMY  2016   COLOSTOMY     and reversal   CORONARY STENT INTERVENTION N/A 06/18/2019   Procedure: CORONARY STENT INTERVENTION;  Surgeon: Iran Ouch, MD;  Location: [facility];  Service: Cardiovascular;  Laterality: N/A;   ENDOMETRIAL ABLATION      HERNIA REPAIR  1985   1985 and 2016 (2016 ventral w/ incarceration) 33 surgeries   HYSTEROSCOPY WITH D & C     13   LAPAROSCOPIC LYSIS OF ADHESIONS     x2   LEFT HEART CATH AND CORONARY ANGIOGRAPHY N/A 06/18/2019   Procedure: LEFT HEART CATH AND CORONARY ANGIOGRAPHY;  Surgeon: Iran Ouch, MD;  Location: [facility] INVAS[facility] CV [facility];  Service: Cardiovascular;  Laterality: N/A;   LEFT HEART CATH AND CORONARY ANGIOGRAPHY N/A 11/30/2019   Procedure: LEFT HEART CATH AND CORONARY ANGIOGRAPHY;  Surgeon: Lennette Bihari, MD;  Location: [facility] INVAS[facility] CV [facility];  Service: Cardiovascular;  Laterality: N/A;   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   TONSILLECTOMY  1959   VEIN SURGERY     laser x2   Patient Active Problem List   Diagnosis Date Noted   Rhabdomyolysis 07/21/2022   Acute kidney injury superimposed on chronic kidney disease (HCC) 07/21/2022   Fall at home, initial encounter 07/21/2022   High anion gap metabolic acidosis 07/21/2022   Elevated liver enzymes 07/21/2022   Acute diverticulitis 07/20/2022   Chronic venous insufficiency 01/08/2022   Irritable bowel syndrome with diarrhea 06/21/2021   Imdur therapy-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) 03/22/2021   Compression fracture of L1 lumbar vertebra (HCC) 03/22/2021   Lumbar radiculopathy 03/22/2021   Neuropathy 03/22/2021   Paresthesia of bilateral legs 03/22/2021   Aortic atherosclerosis (HCC) 03/22/2021   Type II diabetes mellitus with manifestations (HCC) 03/21/2021   COPD (chronic obstructive pulmonary disease) (HCC) 03/21/2021   CAD S/P PCI 06/30/2019   Dyslipidemia, goal LDL below 70 06/30/2019   Morbid obesity (HCC)    Essential hypertension    History of non-ST elevation myocardial infarction (NSTEMI) 06/17/2019   Acute on chronic respiratory failure with hypoxemia (HCC) 01/24/2018   Mild episode of recurrent major depressive disorder (HCC) 03/25/2017   CKD (chronic kidney disease) stage 3, GFR  30-59 ml/min (HCC) 03/19/2017   Vitamin D deficiency 03/19/2017   B12 deficiency 03/19/2017   Nocturnal oxygen desaturation 12/11/2016   BMI 45.0-49.9, adult (HCC) 12/11/2016   Ambulates with cane 12/11/2016   Chronic pruritic rash in adult 07/08/2016   Chronic pain syndrome 03/13/2015   Abdominal wall hernia 01/27/2014   Obstructive sleep apnea (adult) (pediatric) 08/05/2013   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HCC) 02/18/2013   Moderate persistent asthma 12/30/2012   History of recurrent deep vein thrombosis (DVT) 12/30/2012    PCP: Natalia Leatherwood, DO  REFERRING PROVIDER: Calvert Cantor, MD  REFERRING DIAG: [phone] (ICD-10-CM) - Rhabdomyolysis   THERAPY DIAG:  Unsteadiness on feet  Other abnormalities of gait and mobility  Muscle weakness (generalized)  Pain in both lower extremities  RATIONALE FOR EVALUATION AND TREATMENT: Rehabilitation  ONSET DATE: July 18, 2022  NEXT MD VISIT: 09/09/22 with PCP   SUBJECTIVE:   SUBJECTIVE STATEMENT: Pt reports using her O2 machine at home but not able to move very well with it. Her back, legs, and abdomen are in pain today.   PAIN:  Are you having pain? Yes: NPRS scale:  7/10 Pain location: Low back & abdomen (from coughing) Pain description: stabbing, stinging Aggravating factors: everything Relieving factors: ibuprofen or Tylenol as needed   PERTINENT HISTORY: Asthma, Stage III CKD, COPD, Depression, GERD, Heart Disease (Coronary Stent, Ablation, Heart Cath), Lumbar radiculopathy, Obesity, Type II DM, Recurrent DVT   PRECAUTIONS: Fall  WEIGHT BEARING RESTRICTIONS: No  FALLS:  Has patient fallen in last 6 months? Yes. Number of falls 4-6  LIVING ENVIRONMENT: Lives with: lives with their son and daughter-in-law Lives in: House/apartment Stairs: Yes: External: uses a ramp that was built steps; none Has following equipment at home: Single point cane, Environmental consultant - 4 wheeled, Tour manager, Grab bars, and Ramped  entry  OCCUPATION: does not work  PLOF: Needs assistance with ADLs and Needs assistance with transfers  PATIENT GOALS: "improve balance and stability"   OBJECTIVE:   DIAGNOSTIC FINDINGS:  07/22/22 MR Thoracic Spine w/wo Contrast IMPRESSION: 1. No acute findings or explanation for the patient's symptoms. 2. Chronic superior endplate compression deformity at L1. No acute osseous findings. 3. Mild thoracic spondylosis without cord deformity or high-grade spinal stenosis. 4. 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 5. Mild disc bulging and facet hypertrophy at L4-5 contributing to mild spinal stenosis with mild lateral recess and foraminal narrowing bilaterally.  07/22/22 MR Lumbar Spine w/wo Contrast IMPRESSION: 1. No acute findings or explanation for the patient's symptoms. 2. Chronic superior endplate compression deformity at L1. No acute osseous findings. 3. Mild thoracic spondylosis without cord deformity or high-grade spinal stenosis. 4. 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 5. Mild disc bulging and facet hypertrophy at L4-5 contributing to mild spinal stenosis with mild lateral recess and foraminal narrowing bilaterally.  PATIENT SURVEYS:  ABC scale 60 / 1600 = 3.8 %  COGNITION: Overall cognitive status: Impaired and increased response time and delay with answer questions, occasional stuttering      SENSATION: Light touch: Impaired  Proprioception: Impaired   EDEMA:  NT  MUSCLE LENGTH: NT  POSTURE: rounded shoulders, forward head, and increased thoracic kyphosis  PALPATION: NT  LOWER EXTREMITY ROM:  Grossly limited by general observation  LOWER EXTREMITY MMT: MMT increased back pain  MMT Right eval Left eval  Hip flexion 3 2+  Hip extension    Hip abduction 4-* 4-*  Hip adduction 4-* 4-*   Hip internal rotation 3+ 3+  Hip external rotation 3+ 3+  Knee flexion 4- 4-  Knee extension 4- 4-  Ankle dorsiflexion 4- 4-  Ankle plantarflexion    Ankle inversion    Ankle eversion     (Blank rows = not tested, *=modified to sitting position for testing)  LOWER EXTREMITY SPECIAL TESTS:  NT  FUNCTIONAL TESTS: *to be assessed on next visit* 5 times sit to stand: 34.69 sec with B UE assist 10 meter walk test: 76.25 sec with rollator Berg Balance Scale: 21/56, < 36 high risk for falls (close to 100%)   (08/29/22) Gait speed: 0.43 ft/sec with rollator  GAIT: Distance walked: 90 Assistive device utilized: Walker - 4 wheeled Level of assistance: Modified independence Comments: decreased BLE, significantly decreased gait speed, decreased stride/step length BLE, forward flexed posture   TODAY'S TREATMENT:                                                                                                                              DATE:   09/12/22 THERAPEUTIC EXERCISE: to improve flexibility, strength and mobility.  Verbal and tactile cues throughout for technique.  Gait 320 ft RW - cue for posture but pt unable d/t pain - O2 sats: 88% after first trial, 97% after second Seated hip ADD ball squeeze 10x5" Seated TrA activation 10x5" Seated LAQ x 10 bil Seated march x 10 bil Seated hip ABD RTB x 5 bil  09/05/22 THERAPEUTIC ACTIVITIES:  Reviewed rollator safety with proper positioning of rollator when preparing to sit as well as safe hand placement and proper use of brakes during transfers.  THERAPEUTIC EXERCISE: to improve flexibility, strength and mobility.  Verbal and tactile cues throughout for technique.  NuStep - L5 x 6 min; O2 sats after warmup: 99%, HR = 88 Hooklying LTR x 10 Hooklying L/R HS stretch with strap 10 x 10" Hooklying TrA isometric 1 x 5" Seated TrA isometric 5 x 5" Seated TrA + alt march x 5   08/30/22 O2 sats: 95-96% on room air  THERAPEUTIC  ACTIVITIES: Berg = 21/56; < 36 high risk for falls (close to 100%)  5xSTS = 34.69 sec with B UE assist Training in safe use of rollator, including proper use of brakes during transfers, height adjustment, posture and rollator proximity while walking and need to use rollator all the way to her destination when preparing to sit.  SELF CARE:  Discussion on use of home O2      PATIENT EDUCATION:  Education details: initial HEP, transfer safety, and gait safety with rollator   Person educated: Patient Education method: Explanation, Demonstration, and Verbal cues Education comprehension: verbalized understanding, returned demonstration, verbal cues required, and needs further education  HOME EXERCISE PROGRAM: Access Code: [email or web address] URL: [email or web address] Date: 09/05/2022 Prepared by: Annie Paras  Exercises - Seated Transversus Abdominis Bracing  - 1 x daily - 7 x weekly - 2 sets - 5 reps - 5 sec hold - Seated March  - 1 x daily - 7 x weekly - 2 sets - 5 reps - 3 sec hold - Seated Single Leg Hip Abduction  - 1 x daily - 7 x weekly - 2 sets - 5 reps - 3 sec hold  ASSESSMENT:  CLINICAL IMPRESSION: Progressed gait and TE today with pt demonstrating good response. O2 sats dropped to 88 after first trial of gait but returned to normal values shortly after. During each trial of gait she reported fatigue in arms and legs so we took 2 seated breaks. After gait training she was winded so we finished with seated exercises. Lots of cues with seated TrA activation. Able to progress seated hip ABD with Red TB today. STG #1 met for HEP and STG #2 progressing.  OBJECTIVE IMPAIRMENTS: Abnormal gait, cardiopulmonary status limiting activity, decreased activity tolerance, decreased balance, decreased cognition, decreased coordination, decreased endurance, decreased knowledge of condition, decreased knowledge of use of DME, decreased mobility, difficulty walking, decreased ROM, decreased  strength, decreased safety awareness, dizziness, hypomobility, increased edema, increased fascial restrictions, impaired perceived functional ability, impaired flexibility, impaired sensation, impaired tone, postural dysfunction, and pain.   ACTIVITY LIMITATIONS: carrying, lifting, bending, sitting, standing, squatting, stairs, transfers, bed mobility, bathing, toileting, dressing, and locomotion level  PARTICIPATION LIMITATIONS: meal prep, cleaning, laundry, medication management, shopping, and community activity  PERSONAL FACTORS: Age, Education, Past/current experiences, Time since onset of injury/illness/exacerbation, and 3+ comorbidities: Asthma, Stage III CKD, COPD, Depression, GERD, Heart Disease (Coronary Stent, Ablation, Heart Cath), Lumbar radiculopathy, Obesity, Type II DM, Recurrent DVT  are also affecting patient's functional outcome.   REHAB POTENTIAL: Good  CLINICAL DECISION MAKING: Evolving/moderate complexity  EVALUATION COMPLEXITY: Moderate   GOALS: Goals reviewed with patient? Yes  SHORT TERM GOALS: Target date: 08/29/22, extended to 09/13/2022 due prolonged absence from PT   Pt will be independent with initial HEP.  Baseline: Goal status: MET- 09/12/22  2.  Pt will demonstrate safe techniques during transfers and gait using the rollator without needed cueing.  Baseline:  Goal status: IN PROGRESS - 09/12/22 cues needed to keep close proximity to walker. Safe with STS transfers using rollator  LONG TERM GOALS: Target date: 09/26/22  Pt will be independent with advanced/ongoing HEP to improve outcomes/carryover.  Baseline:  Goal status: IN PROGRESS  2.  Pt will report at least 22% balance confidence on ABC scale to demonstrate improved confidence with functional mobility.   Baseline: 60 / 1600 = 3.8 % (MDIC 19%) Goal status: IN PROGRESS  3.  Pt will improve Berg Balance Score by 8 points to decrease risk of falls.  Baseline: 21/56 Goal status: IN PROGRESS  4.  Pt  will demonstrate  consistent safe ambulation and transfers techniques using Rollator to improve safety around the home and in the community.  Baseline:  Goal status: IN PROGRESS  5.  Pt will amb >300 feet with normal gait pattern and LRAD to complete daily tasks around the home with increased endurance.  Baseline:  Goal status: IN PROGRESS  6.  Pt will improve 5x STS time to </= 20 seconds to demonstrate improved functional strength and transfer efficiency.  Baseline: 34.69 sec with B UE assist Goal status: IN PROGRESS  7.  Pt will improve gait speed to >1.3 ft/sec to improve household ambulation with decreased fall risk.  Baseline: 0.43 ft/sec with rollator Goal status: IN PROGRESS  8.  Pt will report 50% improvement in low back and LE pain to improve QOL.  Baseline:  Goal status: IN PROGRESS    PLAN:  PT FREQUENCY: 2x/week  PT DURATION: 8 weeks  PLANNED INTERVENTIONS: Therapeutic exercises, Therapeutic activity, Neuromuscular re-education, Balance training, Gait training, Patient/Family education, Self Care, Joint mobilization, DME instructions, Aquatic Therapy, Dry Needling, Electrical stimulation, Cryotherapy, Moist heat, Compression bandaging, Vasopneumatic device, Ultrasound, Ionotophoresis 4mg /ml Dexamethasone, Manual therapy, and Re-evaluation  PLAN FOR NEXT SESSION: vital sign check throughout session, handout on safety in the home/DME, progress general low back and LE ROM/strengthening exercises as tolerated - review/update HEP as indicated, static and dynamic balance intervention    , PTA 09/12/2022, 5:04 PM

## 2022-09-16 ENCOUNTER — Encounter: Payer: Medicare Other | Admitting: Physical Therapy

## 2022-09-17 ENCOUNTER — Ambulatory Visit: Payer: Medicare Other

## 2022-09-18 ENCOUNTER — Ambulatory Visit (INDEPENDENT_AMBULATORY_CARE_PROVIDER_SITE_OTHER): Payer: Medicare Other | Admitting: Family Medicine

## 2022-09-18 ENCOUNTER — Encounter: Payer: Self-pay | Admitting: Family Medicine

## 2022-09-18 VITALS — BP 120/80 | HR 105 | Temp 98.1°F | Ht 62.0 in | Wt 240.0 lb

## 2022-09-18 DIAGNOSIS — E538 Deficiency of other specified B group vitamins: Secondary | ICD-10-CM

## 2022-09-18 DIAGNOSIS — E559 Vitamin D deficiency, unspecified: Secondary | ICD-10-CM

## 2022-09-18 DIAGNOSIS — M5416 Radiculopathy, lumbar region: Secondary | ICD-10-CM

## 2022-09-18 DIAGNOSIS — F330 Major depressive disorder, recurrent, mild: Secondary | ICD-10-CM

## 2022-09-18 DIAGNOSIS — I252 Old myocardial infarction: Secondary | ICD-10-CM | POA: Diagnosis not present

## 2022-09-18 DIAGNOSIS — J42 Unspecified chronic bronchitis: Secondary | ICD-10-CM

## 2022-09-18 DIAGNOSIS — E785 Hyperlipidemia, unspecified: Secondary | ICD-10-CM

## 2022-09-18 DIAGNOSIS — Z9861 Coronary angioplasty status: Secondary | ICD-10-CM

## 2022-09-18 DIAGNOSIS — G894 Chronic pain syndrome: Secondary | ICD-10-CM

## 2022-09-18 DIAGNOSIS — N1831 Chronic kidney disease, stage 3a: Secondary | ICD-10-CM

## 2022-09-18 DIAGNOSIS — I10 Essential (primary) hypertension: Secondary | ICD-10-CM

## 2022-09-18 DIAGNOSIS — E118 Type 2 diabetes mellitus with unspecified complications: Secondary | ICD-10-CM

## 2022-09-18 DIAGNOSIS — I25119 Atherosclerotic heart disease of native coronary artery with unspecified angina pectoris: Secondary | ICD-10-CM

## 2022-09-18 DIAGNOSIS — I2510 Atherosclerotic heart disease of native coronary artery without angina pectoris: Secondary | ICD-10-CM

## 2022-09-18 DIAGNOSIS — D6869 Other thrombophilia: Secondary | ICD-10-CM

## 2022-09-18 DIAGNOSIS — R202 Paresthesia of skin: Secondary | ICD-10-CM

## 2022-09-18 DIAGNOSIS — G629 Polyneuropathy, unspecified: Secondary | ICD-10-CM

## 2022-09-18 DIAGNOSIS — I700 Atherosclerosis of aorta: Secondary | ICD-10-CM

## 2022-09-18 MED ORDER — AMITRIPTYLINE HCL 50 MG PO TABS: 50.0000 mg | ORAL_TABLET | Freq: Every day | ORAL | 1 refills | Status: DC

## 2022-09-18 MED ORDER — FAMOTIDINE 40 MG PO TABS: 40.0000 mg | ORAL_TABLET | Freq: Every day | ORAL | 1 refills | Status: DC

## 2022-09-18 MED ORDER — VITAMIN D3 50 MCG (2000 UT) PO CAPS: 4000.0000 [IU] | ORAL_CAPSULE | Freq: Every day | ORAL | 3 refills | Status: DC

## 2022-09-18 MED ORDER — HYDROXYZINE HCL 25 MG PO TABS: 25.0000 mg | ORAL_TABLET | Freq: Three times a day (TID) | ORAL | 1 refills | Status: DC | PRN

## 2022-09-18 MED ORDER — EMPAGLIFLOZIN 10 MG PO TABS: 10.0000 mg | ORAL_TABLET | Freq: Every day | ORAL | 5 refills | Status: DC

## 2022-09-18 MED ORDER — CLOTRIMAZOLE 1 % EX CREA: 1.0000 | TOPICAL_CREAM | Freq: Two times a day (BID) | CUTANEOUS | 2 refills | Status: DC

## 2022-09-18 MED ORDER — DICYCLOMINE HCL 20 MG PO TABS: 20.0000 mg | ORAL_TABLET | Freq: Four times a day (QID) | ORAL | 5 refills | Status: DC | PRN

## 2022-09-18 NOTE — Patient Instructions (Signed)
No follow-ups on file.        Great to see you today.  I have refilled the medication(s) we provide.   If labs were collected, we will inform you of lab results once received either by echart message or telephone call.   - echart message- for normal results that have been seen by the patient already.   - telephone call: abnormal results or if patient has not viewed results in their echart.  

## 2022-09-18 NOTE — Progress Notes (Signed)
Patient ID: Mary Ferguson, female  DOB: 12/19/1950, 72 y.o.   MRN: [id number] Patient Care Team    Relationship Specialty Notifications Start End  Natalia LeatherwoodKuneff, Margarita Bobrowski A, DO PCP - General Family Medicine  07/20/22   Croitoru, Rachelle HoraMihai, MD Consulting Physician Cardiology  03/21/21   Mateo FlowHecker, Kathryn, MD Consulting Physician Ophthalmology  03/21/21   Enzo BiKellam, Lori, MD Referring Physician Surgery  03/21/21   Aletha Halimunheim, Andreas, MD Referring Physician Neurology  03/21/21   Lennette BihariKelly, Thomas A, MD Consulting Physician Cardiology  03/21/21   Doloris HallBashore, Christopher J., MD Physician Assistant Sports Medicine  03/21/21   Donnetta SimpersBeauchamp-Bruno, Mayra C, MD Referring Physician Dermatology  03/21/21   Tomma Lightninglalere, Adewale A, MD Consulting Physician Pulmonary Disease  08/07/22     Chief Complaint  Patient presents with   Hypertension    [facility]mc; pt is fasting     Subjective: Mary Ferguson is a 72 y.o.  female [facility] Chronic generalized abdominal pain/Chronic pain syndrome/Neuropathy/Paresthesia of bilateral legs/Compression fracture of L1 vertebra, initial encounter (HCC)/ Lumbar radiculopathy Patient has noticed an improvement in her neuropathy with amitriptyline 50 mg nightly. She denies oversedation with the amitriptyline.  She would like to to remain on medication  Prior note: Patient has an extensive chronic pain history.  Per her report she has been on very strong high-dose narcotics in the past, and her pain persisted.  She reports a strong family history of substance abuse, and is not interested in narcotics.  She currently is not on any type of medications to help with her discomfort.    B12 deficiency and Vitamin D deficiency She reports compliance with B12 and vitamin D supplementation.    Type II diabetes mellitus with manifestations [facility](HCC) Patient reports she has a history of diabetes, but has never needed to start medication.   Patient's last A1c was 7.2. end of nov 2023.  Farxiga 5 mg was prescribed  for her to start, but she admits she never started secondary to cost of medication.   She is unable to take metformin with her IBS. She did try it again and she has had diarrhea sx.  She has chronic neuropathy. Patient denies dizziness, hyperglycemic or hypoglycemic events. Patient denies numbness, tingling in the extremities or nonhealing wounds of feet.    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  She is on lifelong anticoagulation therapy.  She reports compliance with Xarelto  Essential hypertension/Dyslipidemia, goal LDL below 70/CAD S/P PCI/History of non-ST elevation myocardial infarction (NSTEMI)//Morbid obesity (HCC)/Coronary artery disease involving native coronary artery of native heart with angina pectoris (HCC)/Aortic atherosclerosis (HCC) Managed by cardiology   Chronic bronchitis, unspecified chronic bronchitis type (HCC)/Moderate persistent asthma, unspecified whether complicated/Nocturnal oxygen desaturation/Obstructive sleep apnea (adult)  Managed by pulmonology     09/18/2022    2:51 PM 04/09/2022    8:49 AM  Depression screen PHQ 2/9  Decreased Interest 0 0  Down, Depressed, Hopeless 1 0  PHQ - 2 Score 1 0  Altered sleeping 0   Tired, decreased energy 1   Change in appetite 1   Feeling bad or failure about yourself  1   Trouble concentrating 0   Moving slowly or fidgety/restless 0   Suicidal thoughts 0   PHQ-9 Score 4       09/18/2022    2:51 PM  GAD 7 : Generalized Anxiety Score  Nervous, Anxious, on Edge 0  Control/stop worrying  0  Worry too much - different things 0  Trouble relaxing 0  Restless 0  Easily annoyed or irritable 0  Afraid - awful might happen 0  Total GAD 7 Score 0           08/07/2022    8:43 AM 04/06/2022   11:07 AM 10/19/2021    3:19 PM 03/21/2021    2:19 PM  Fall Risk   Falls in the past year? 1 1 1 1   Number falls in past yr: 1 1 1 1    Injury with Fall? 1 1 0 0  Risk for fall due to : History of fall(s) History of fall(s) History of fall(s) History of fall(s)  Follow up Falls evaluation completed Falls evaluation completed Falls evaluation completed Falls evaluation completed   Immunization History  Administered Date(s) Administered   Fluad Quad(high Dose 65+) 05/08/2021, 08/07/2022   Influenza, Seasonal, Injecte, Preservative Fre 06/22/2015   Influenza,inj,quad, With Preservative 07/05/2016   Influenza-Unspecified 05/10/2013, 05/05/2014   MODERNA SARS COV-2 Pediatric Vaccination 14mos to <45yrs 08/12/2020, 04/02/2021   Moderna Sars-Covid-2 Vaccination 11/04/2019, 12/02/2019   PNEUMOCOCCAL CONJUGATE-20 04/05/2022   Pneumococcal Conjugate-13 08/26/2004    No results found.  Past Medical History:  Diagnosis Date   Acute and chronic respiratory failure with hypoxia (HCC) 01/24/2018   Acute diverticulitis 07/20/2022   Acute kidney injury superimposed on chronic kidney disease (HCC) 07/21/2022   Asthma    Blood in stool    Chicken pox    Chronic generalized abdominal pain    Chronic pain    CKD (chronic kidney disease), stage III (HCC)    Colon polyps    COPD (chronic obstructive pulmonary disease) (HCC)    COVID-19 2020   Depression    Diverticulitis    Very severe.  Has had 2 bowel resections and temporary colostomy in the past.   Fall at home, initial encounter 07/21/2022   Fecal incontinence    Frequent headaches    GERD (gastroesophageal reflux disease)    Heart attack (HCC)    Heart disease    High anion gap metabolic acidosis 07/21/2022   History of blood transfusion    Hyperlipemia    Hypertension    Incontinence of feces 12/11/2016   Morbid obesity (HCC)    Pneumonia due to infectious organism 01/12/2018   Postmenopausal 03/21/2021   PTSD (post-traumatic stress disorder)    Recurrent deep vein thrombosis (DVT) (HCC)    x6   Rhabdomyolysis 07/21/2022   Urinary incontinence    Allergies   Allergen Reactions   Rofecoxib Anaphylaxis    VIOXX   Metformin And Related Other (See Comments)    Ibs    Benadryl Allergy [Diphenhydramine Hcl] Other (See Comments)    Depressed respirations   Cephalexin Rash   Gatifloxacin Other (See Comments)    Confusion Very low blood pressure [place]inn   Past Surgical History:  Procedure Laterality Date   ABDOMINAL HYSTERECTOMY  1995   APPENDECTOMY     CATARACT EXTRACTION Bilateral    CESAREAN SECTION  1984   CHOLECYSTECTOMY  2016   COLOSTOMY     and reversal   CORONARY STENT INTERVENTION N/A 06/18/2019   Procedure: CORONARY STENT INTERVENTION;  Surgeon: 2017, MD;  Location: [facility];  Service: Cardiovascular;  Laterality: N/A;   ENDOMETRIAL ABLATION     HERNIA REPAIR  1985   1985 and 2016 (2016 ventral w/ incarceration) 33 surgeries   HYSTEROSCOPY WITH D & C  [place]     x2   LEFT HEART CATH AND CORONARY ANGIOGRAPHY N/A 06/18/2019   Procedure: LEFT HEART CATH AND CORONARY ANGIOGRAPHY;  Surgeon: Wellington Hampshire, MD;  Location: [place] [facility];  Service: Cardiovascular;  Laterality: N/A;   LEFT HEART CATH AND CORONARY ANGIOGRAPHY N/A 11/30/2019   Procedure: LEFT HEART CATH AND CORONARY ANGIOGRAPHY;  Surgeon: Troy Sine, MD;  Location: [place] [facility];  Service: Cardiovascular;  Laterality: N/A;   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[birth year]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   TONSILLECTOMY  1959   VEIN SURGERY     laser x2   Family History  Problem Relation Age of Onset   Depression Mother    Drug abuse Mother    Mental illness Mother    Learning disabilities Mother    Miscarriages / [place] Mother    Alcohol abuse Mother    Mental illness Father    Depression Father    Heart disease Father    Drug abuse Sister    Depression Sister    Asthma Sister    Alcohol abuse Sister    Early death Sister    Mental illness Son    Hypertension Son    Drug abuse Son    Depression Son     Asthma Son    Alcohol abuse Son    Clotting disorder Son    Heart disease Maternal Grandmother    Diabetes Maternal Grandmother    Arthritis Maternal Grandmother    COPD Maternal Grandmother    Parkinson's disease Maternal Grandmother    Heart disease Maternal Grandfather    Heart attack Maternal Grandfather    Early death Maternal Grandfather    COPD Maternal Grandfather    Heart disease Paternal Grandmother    Heart attack Paternal Grandmother    Depression Paternal Grandmother    Arthritis Paternal Grandmother    Breast cancer Paternal Grandmother    Heart disease Paternal Grandfather    Heart attack Paternal Grandfather    Early death Paternal Grandfather    COPD Paternal Grandfather    Mental illness Half-Brother    Learning disabilities Half-Brother    Hypertension Half-Brother    Hyperlipidemia Half-Brother    Drug abuse Half-Brother    Heart disease Half-Brother    Diabetes Half-Brother    Depression Half-Brother    Asthma Half-Brother    Alcohol abuse Half-Brother    Arthritis Half-Brother    Kidney disease Half-Brother    Testicular cancer Half-Brother    Diabetes Half-Sister    Mental illness Half-Sister    Learning disabilities Half-Sister    Drug abuse Half-Sister    Depression Half-Sister    Alcohol abuse Half-Sister    Thyroid cancer Half-Sister    Social History   Social History Narrative   Marital status/children/pets:    Education/employment: retired     - wears seatbelt: Yes     - Feels safe in their relationships: Yes       Allergies as of 09/18/2022       Reactions   Rofecoxib Anaphylaxis   VIOXX   Metformin And Related Other (See Comments)   Ibs    Benadryl Allergy [diphenhydramine Hcl] Other (See Comments)   Depressed respirations   Cephalexin Rash   Gatifloxacin Other (See Comments)   Confusion Very low blood pressure [place]inn        Medication List        Accurate as of September 18, 2022  4:36 PM. If you have any  questions, ask your nurse or doctor.          acetaminophen 500 MG tablet Commonly known as: TYLENOL Take 500-1,000 mg by mouth as needed for mild pain or moderate pain.   albuterol 108 (90 Base) MCG/ACT inhaler Commonly known as: VENTOLIN HFA Inhale 2 puffs into the lungs every 4 (four) hours as needed for wheezing or shortness of breath.   amitriptyline 50 MG tablet Commonly known as: ELAVIL Take 1 tablet (50 mg total) by mouth at bedtime.   amLODipine 2.5 MG tablet Commonly known as: NORVASC Take 1 tablet (2.5 mg total) by mouth daily.   atorvastatin 80 MG tablet Commonly known as: LIPITOR Take 1 tablet (80 mg total) by mouth every evening. What changed: when to take this   carvedilol 6.25 MG tablet Commonly known as: COREG TAKE 1 TABLET BY MOUTH IN THE MORNING AND 2 TABLETS IN THE EVENING What changed:  how much to take how to take this when to take this additional instructions   clotrimazole 1 % cream Commonly known as: Clotrimazole Anti-Fungal Apply 1 Application topically 2 (two) times daily. What changed:  when to take this reasons to take this   dicyclomine 20 MG tablet Commonly known as: BENTYL Take 1 tablet (20 mg total) by mouth 4 (four) times daily as needed for spasms. What changed: when to take this   diphenoxylate-atropine 2.5-0.025 MG tablet Commonly known as: LOMOTIL Take 1 tablet by mouth 4 (four) times daily as needed for diarrhea or loose stools.   empagliflozin 10 MG Tabs tablet Commonly known as: JARDIANCE Take 1 tablet (10 mg total) by mouth daily before breakfast. Started by: Felix Pacini, DO   famotidine 40 MG tablet Commonly known as: PEPCID Take 1 tablet (40 mg total) by mouth at bedtime.   fluticasone furoate-vilanterol 100-25 MCG/ACT Aepb Commonly known as: Breo Ellipta Inhale 1 puff into the lungs daily.   hydrOXYzine 25 MG tablet Commonly known as: ATARAX Take 1 tablet (25 mg total) by mouth every 8 (eight) hours as  needed for itching. What changed: when to take this   ipratropium-albuterol 0.5-2.5 (3) MG/3ML Soln Commonly known as: DUONEB Take 3 mLs by nebulization every 4 (four) hours as needed.   isosorbide mononitrate 60 MG 24 hr tablet Commonly known as: IMDUR TAKE 1 TABLET(60 MG) BY MOUTH DAILY What changed:  how much to take how to take this when to take this additional instructions   metFORMIN 500 MG tablet Commonly known as: GLUCOPHAGE Take 1 tablet (500 mg total) by mouth 2 (two) times daily with a meal.   nitroGLYCERIN 0.4 MG SL tablet Commonly known as: NITROSTAT PLACE 1 TABLET UNDER THE TONGUE EVERY 5 MINUTES FOR 3 DOSES AS NEEDED FOR CHEST PAIN What changed: See the new instructions.   psyllium 95 % Pack Commonly known as: HYDROCIL/METAMUCIL Take 1 packet by mouth daily.   rivaroxaban 20 MG Tabs tablet Commonly known as: Xarelto TAKE 1 TABLET(20 MG) BY MOUTH EVERY EVENING What changed:  how much to take how to take this when to take this additional instructions   True Metrix Blood Glucose Test test strip Generic drug: glucose blood Use as instructed   TRUEplus Lancets 33G Misc 1 Stick by Does not apply route daily.   Vitamin D3 50 MCG (2000 UT) capsule Take 2 capsules (4,000 Units total) by mouth daily. What changed: how much to take        All past  medical history, surgical history, allergies, family history, immunizations andmedications were updated in the EMR today and reviewed under the history and medication portions of their EMR.         ROS: 14 pt review of systems performed and negative (unless mentioned in an HPI)  Objective: BP 120/80   Pulse (!) 105   Temp 98.1 F (36.7 C) (Oral)   Ht 5\' 2"  (1.575 m)   Wt 240 lb (108.9 kg)   SpO2 97%   BMI 43.90 kg/m  Physical Exam Vitals and nursing note reviewed.  Constitutional:      General: She is not in acute distress.    Appearance: Normal appearance. She is not ill-appearing, toxic-appearing  or diaphoretic.  HENT:     Head: Normocephalic and atraumatic.  Eyes:     General: No scleral icterus.       Right eye: No discharge.        Left eye: No discharge.     Extraocular Movements: Extraocular movements intact.     Conjunctiva/sclera: Conjunctivae normal.     Pupils: Pupils are equal, round, and reactive to light.  Cardiovascular:     Rate and Rhythm: Normal rate and regular rhythm.  Pulmonary:     Effort: Pulmonary effort is normal. No respiratory distress.     Breath sounds: Normal breath sounds. No wheezing, rhonchi or rales.  Musculoskeletal:     Right lower leg: No edema.     Left lower leg: No edema.  Skin:    General: Skin is warm.     Findings: No rash.  Neurological:     Mental Status: She is alert and oriented to person, place, and time. Mental status is at baseline.     Motor: No weakness.     Gait: Gait normal.  Psychiatric:        Mood and Affect: Mood normal.        Behavior: Behavior normal.        Thought Content: Thought content normal.        Judgment: Judgment normal.     Assessment/plan: Mary Ferguson is a 72 y.o. female present for  Chronic pain syndrome/Neuropathy/Paresthesia of bilateral legs/Compression fracture of L1 vertebra, initial encounter (HCC)/ Lumbar radiculopathy/IBS-like pain -She has reportedly been on many pain medicines in the past and did not find them helpful.  She is not interested in narcotics.  She has history of compression fraction and lumbar radiculopathy (L4-L5 region). I believe her pain is multifactorial.   -Consider Cymbalta-may be an option later. -Consider Lyrica-also may be an option later.   -Continue Elavil 50 mg nightly.  She understands not stop abruptly because she does not think it is working.  Hopefully this will help with neuropathic pain, depression and she may benefit with GI complaints. -NSAIDs contraindicated with her cardiac history and Xarelto use. -She is currently being managed with lower back pain  by Ortho team with injections. -Referral to vascular and vein had been placed for her   B12 deficiency and Vitamin D deficiency Continue oral supplements .  Type II diabetes mellitus with manifestations (HCC) A1c increased to 7.2 > 7.1> 7.2 > due next visit 66 was too expensive. Metformin has been tried twice and has caused by GI symptoms both times.   Sulfonylurea class not ideal with her gait instability and side effect of dizziness  Prescribed Jardiance 10 mg today.  If unable to get this approved or affordable, could consider Januvia. Patient is to focus on  her diet,.  Follow a diabetic diet. Foot exam completed 09/14/2022 Pneumonia vaccination completed Ur microalb 09/14/2022  History of recurrent deep vein thrombosis (DVT)/Acquired thrombophilia ([place]) Lifelong anticoagulation therapy with Xarelto.  Essential hypertension/Dyslipidemia, goal LDL below 70/CAD S/P PCI/History of non-ST elevation myocardial infarction (NSTEMI)//Morbid obesity (HCC)/Coronary artery disease involving native coronary artery of native heart with angina pectoris (HCC)/Aortic atherosclerosis (HCC) Stable Medications are managed by cardiology-amlodipine, atorvastatin, carvedilol, Imdur, nitro, Xarelto  Chronic bronchitis, unspecified chronic bronchitis type (HCC)/Moderate persistent asthma, unspecified whether complicated/Nocturnal oxygen desaturation/Obstructive sleep apnea (adult)  Patient is having fatigue with reported history of COPD, asthma, OSA and use of oxygen at night.   - Ambulatory referral to Pulmonology placed 02/2021 and again last visit after hospitalization    Return in about 15 weeks (around 01/01/2023) for Routine chronic condition follow-up.  Orders Placed This Encounter  Procedures   Comp Met (CMET)   TSH   Urine Microalbumin w/creat. ratio   Vitamin D (25 hydroxy)   Magnesium     Meds ordered this encounter  Medications   amitriptyline (ELAVIL) 50 MG tablet    Sig: Take 1  tablet (50 mg total) by mouth at bedtime.    Dispense:  90 tablet    Refill:  1   Cholecalciferol (VITAMIN D3) 50 MCG (2000 UT) capsule    Sig: Take 2 capsules (4,000 Units total) by mouth daily.    Dispense:  180 capsule    Refill:  3   clotrimazole (CLOTRIMAZOLE ANTI-FUNGAL) 1 % cream    Sig: Apply 1 Application topically 2 (two) times daily.    Dispense:  30 g    Refill:  2   dicyclomine (BENTYL) 20 MG tablet    Sig: Take 1 tablet (20 mg total) by mouth 4 (four) times daily as needed for spasms.    Dispense:  120 tablet    Refill:  5   famotidine (PEPCID) 40 MG tablet    Sig: Take 1 tablet (40 mg total) by mouth at bedtime.    Dispense:  90 tablet    Refill:  1   hydrOXYzine (ATARAX) 25 MG tablet    Sig: Take 1 tablet (25 mg total) by mouth every 8 (eight) hours as needed for itching.    Dispense:  270 tablet    Refill:  1   empagliflozin (JARDIANCE) 10 MG TABS tablet    Sig: Take 1 tablet (10 mg total) by mouth daily before breakfast.    Dispense:  30 tablet    Refill:  5     Referral Orders  No referral(s) requested today   Note is dictated utilizing voice recognition software. Although note has been proof read prior to signing, occasional typographical errors still can be missed. If any questions arise, please do not hesitate to call for verification.  Electronically signed by: Howard Pouch, DO [place]

## 2022-09-19 ENCOUNTER — Ambulatory Visit: Payer: Medicare Other

## 2022-09-19 LAB — COMPREHENSIVE METABOLIC PANEL
ALT: 12 U/L (ref 0–35)
AST: 14 U/L (ref 0–37)
Albumin: 4 g/dL (ref 3.5–5.2)
Alkaline Phosphatase: 75 U/L (ref 39–117)
BUN: 20 mg/dL (ref 6–23)
CO2: 25 mEq/L (ref 19–32)
Calcium: 10.1 mg/dL (ref 8.4–10.5)
Chloride: 104 mEq/L (ref 96–112)
Creatinine, Ser: 1.13 mg/dL (ref 0.40–1.20)
GFR: 49.15 mL/min — ABNORMAL LOW (ref >60.00)
Glucose, Bld: 109 mg/dL — ABNORMAL HIGH (ref 70–99)
Potassium: 4.3 mEq/L (ref 3.5–5.1)
Sodium: 141 mEq/L (ref 135–145)
Total Bilirubin: 0.6 mg/dL (ref 0.2–1.2)
Total Protein: 6.7 g/dL (ref 6.0–8.3)

## 2022-09-19 LAB — MAGNESIUM: Magnesium: 1.5 mg/dL (ref 1.5–2.5)

## 2022-09-19 LAB — MICROALBUMIN / CREATININE URINE RATIO
Creatinine,U: 216.1 mg/dL
Microalb Creat Ratio: 1.2 mg/g (ref 0.0–30.0)
Microalb, Ur: 2.6 mg/dL — ABNORMAL HIGH (ref 0.0–1.9)

## 2022-09-19 LAB — VITAMIN D 25 HYDROXY (VIT D DEFICIENCY, FRACTURES): Vit D, 25-Hydroxy: 21 ng/mL — ABNORMAL LOW (ref 30–100)

## 2022-09-19 LAB — TSH: TSH: 3.3 mIU/L (ref 0.40–4.50)

## 2022-09-20 ENCOUNTER — Telehealth: Payer: Self-pay | Admitting: Family Medicine

## 2022-09-20 MED ORDER — MAGNESIUM OXIDE -MG SUPPLEMENT 400 (240 MG) MG PO TABS: 400.0000 mg | ORAL_TABLET | Freq: Two times a day (BID) | ORAL | 5 refills | Status: DC

## 2022-09-20 NOTE — Telephone Encounter (Signed)
LM for pt to return call to discuss.  

## 2022-09-20 NOTE — Telephone Encounter (Signed)
Please call patient: Her kidney function is stable and a little improved from last time.  Continue to hydrate. Vitamin D is still low at 21, encouraged her to start the vitamin D supplement we discussed during her visit. Thyroid is functioning normal. Urine protein levels are normal. Magnesium levels are low at 1.5.  I have called in a magnesium supplement of magnesium oxide 400 mg tab twice daily.  I would encourage her to start this right away.  Magnesium is very important for heart function and muscle function.

## 2022-09-23 ENCOUNTER — Ambulatory Visit: Payer: Medicare Other

## 2022-09-23 MED ORDER — SITAGLIPTIN PHOSPHATE 25 MG PO TABS: 25.0000 mg | ORAL_TABLET | Freq: Every day | ORAL | 5 refills | Status: DC

## 2022-09-23 NOTE — Telephone Encounter (Signed)
LVM for pt to CB regarding results.  

## 2022-09-23 NOTE — Telephone Encounter (Signed)
Pt understood results and recommendations. She however did want a call regarding a possible switch in diabetes medication from metformin that was discussed during her last office visit.

## 2022-09-23 NOTE — Telephone Encounter (Signed)
Spoke with pt regarding medication and instructions.  

## 2022-09-23 NOTE — Telephone Encounter (Signed)
DC Jardiance prescription, called in Januvia.  If neither of these are affordable we need to know what is on her formulary that is affordable.  She is already on metformin and unable to tolerate this tolerate sulfonylurea.

## 2022-09-23 NOTE — Addendum Note (Signed)
Addended by: Howard Pouch A on: 09/23/2022 11:42 AM   Modules accepted: Orders

## 2022-09-23 NOTE — Telephone Encounter (Signed)
Spoke with re: medications and stated that medication Mary Ferguson is too expensive and would like to proceed with next option Januvia.

## 2022-09-24 MED ORDER — EMPAGLIFLOZIN 10 MG PO TABS: 10.0000 mg | ORAL_TABLET | Freq: Every day | ORAL | 5 refills | Status: DC

## 2022-09-24 NOTE — Telephone Encounter (Signed)
Patient has spoke to insurance and her best option for cost is to get Jardiance. Please DC Januvia and sent in Jardiance prescription to pharmacy.  Patient can be reached at [phone].

## 2022-09-24 NOTE — Telephone Encounter (Signed)
New prescription for Jardiance prescribed

## 2022-09-24 NOTE — Addendum Note (Signed)
Addended by: Howard Pouch A on: 09/24/2022 11:55 AM   Modules accepted: Orders

## 2022-09-25 ENCOUNTER — Ambulatory Visit: Payer: Medicare Other

## 2022-09-25 DIAGNOSIS — R2681 Unsteadiness on feet: Secondary | ICD-10-CM | POA: Diagnosis not present

## 2022-09-25 DIAGNOSIS — M79605 Pain in left leg: Secondary | ICD-10-CM

## 2022-09-25 DIAGNOSIS — R2689 Other abnormalities of gait and mobility: Secondary | ICD-10-CM

## 2022-09-25 DIAGNOSIS — M6281 Muscle weakness (generalized): Secondary | ICD-10-CM

## 2022-09-25 NOTE — Therapy (Signed)
OUTPATIENT PHYSICAL THERAPY TREATMENT Progress Note Reporting Period 08/02/23 to 09/26/22  See note below for Objective Data and Assessment of Progress/Goals.      Patient Name: Mary Ferguson MRN: [id number] DOB:May 28, 1951, 72 y.o., female Today's Date: 09/25/2022  END OF SESSION:  PT End of Session - 09/25/22 1539     Visit Number 5    Date for PT Re-Evaluation 11/20/22    Authorization Type Medicare and BCBS Supplement    PT Start Time 1449    PT Stop Time 0272    PT Time Calculation (min) 41 min    Activity Tolerance Patient tolerated treatment well;Patient limited by pain    Behavior During Therapy [facility] for tasks assessed/performed                Past Medical History:  Diagnosis Date   Acute and chronic respiratory failure with hypoxia ([place]) 01/24/2018   Acute diverticulitis 07/20/2022   Acute kidney injury superimposed on chronic kidney disease ([place]) 07/21/2022   Asthma    Blood in stool    Chicken pox    Chronic generalized abdominal pain    Chronic pain    CKD (chronic kidney disease), stage III (HCC)    Colon polyps    COPD (chronic obstructive pulmonary disease) ([place])    COVID-19 2020   Depression    Diverticulitis    Very severe.  Has had 2 bowel resections and temporary colostomy in the past.   Fall at home, initial encounter 07/21/2022   Fecal incontinence    Frequent headaches    GERD (gastroesophageal reflux disease)    Heart attack ([place])    Heart disease    High anion gap metabolic acidosis 53/66/4403   History of blood transfusion    Hyperlipemia    Hypertension    Incontinence of feces 12/11/2016   Morbid obesity ([place])    Pneumonia due to infectious organism 01/12/2018   Postmenopausal 03/21/2021   PTSD (post-traumatic stress disorder)    Recurrent deep vein thrombosis (DVT) (HCC)    x6   Rhabdomyolysis 07/21/2022   Urinary incontinence    Past Surgical History:  Procedure Laterality Date   ABDOMINAL HYSTERECTOMY  1995   APPENDECTOMY      CATARACT EXTRACTION Bilateral    CESAREAN SECTION  1984   CHOLECYSTECTOMY  2016   COLOSTOMY     and reversal   CORONARY STENT INTERVENTION N/A 06/18/2019   Procedure: CORONARY STENT INTERVENTION;  Surgeon: Wellington Hampshire, MD;  Location: [facility];  Service: Cardiovascular;  Laterality: N/A;   [place] and 2016 (2016 ventral w/ incarceration) 33 surgeries   HYSTEROSCOPY WITH D & C     13   LAPAROSCOPIC LYSIS OF ADHESIONS     x2   LEFT HEART CATH AND CORONARY ANGIOGRAPHY N/A 06/18/2019   Procedure: LEFT HEART CATH AND CORONARY ANGIOGRAPHY;  Surgeon: Wellington Hampshire, MD;  Location: [facility];  Service: Cardiovascular;  Laterality: N/A;   LEFT HEART CATH AND CORONARY ANGIOGRAPHY N/A 11/30/2019   Procedure: LEFT HEART CATH AND CORONARY ANGIOGRAPHY;  Surgeon: Troy Sine, MD;  Location: [facility];  Service: Cardiovascular;  Laterality: N/A;   NASAL RECONSTRUCTION  02-Jun-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   TONSILLECTOMY  1959   VEIN SURGERY     laser x2   Patient Active Problem List   Diagnosis Date Noted   Elevated liver enzymes 07/21/2022  Chronic venous insufficiency 01/08/2022   Irritable bowel syndrome with diarrhea 06/21/2021   Imdur therapy-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) 03/22/2021   Compression fracture of L1 lumbar vertebra (HCC) 03/22/2021   Lumbar radiculopathy 03/22/2021   Neuropathy 03/22/2021   Paresthesia of bilateral legs 03/22/2021   Aortic atherosclerosis (HCC) 03/22/2021   Type II diabetes mellitus with manifestations (HCC) 03/21/2021   COPD (chronic obstructive pulmonary disease) (HCC) 03/21/2021   CAD S/P PCI 06/30/2019   Dyslipidemia, goal LDL below 70 06/30/2019   Morbid obesity (HCC)    Essential hypertension    History of non-ST elevation myocardial infarction (NSTEMI) 06/17/2019   Acute on chronic respiratory failure with hypoxemia (HCC) 01/24/2018   Mild episode  of recurrent major depressive disorder (HCC) 03/25/2017   CKD (chronic kidney disease) stage 3, GFR 30-59 ml/min (HCC) 03/19/2017   Vitamin D deficiency 03/19/2017   B12 deficiency 03/19/2017   Nocturnal oxygen desaturation 12/11/2016   BMI 45.0-49.9, adult (HCC) 12/11/2016   Ambulates with cane 12/11/2016   Chronic pruritic rash in adult 07/08/2016   Chronic pain syndrome 03/13/2015   Abdominal wall hernia 01/27/2014   Obstructive sleep apnea (adult) (pediatric) 08/05/2013   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HCC) 02/18/2013   Moderate persistent asthma 12/30/2012   History of recurrent deep vein thrombosis (DVT) 12/30/2012    PCP: Natalia Leatherwood, DO  REFERRING PROVIDER: Calvert Cantor, MD  REFERRING DIAG: [phone] (ICD-10-CM) - Rhabdomyolysis   THERAPY DIAG:  Unsteadiness on feet  Other abnormalities of gait and mobility  Muscle weakness (generalized)  Pain in both lower extremities  RATIONALE FOR EVALUATION AND TREATMENT: Rehabilitation  ONSET DATE: July 18, 2022  NEXT MD VISIT: 09/09/22 with PCP   SUBJECTIVE:   SUBJECTIVE STATEMENT: Pt reports she fell yesterday while turning around in shower, fell back and hit her head. She does report dizziness not really worse than dizziness she would experience before when standing.  PAIN:  Are you having pain? Yes: NPRS scale:  7/10 Pain location: Low back & abdomen (from coughing) Pain description: stabbing, stinging Aggravating factors: everything Relieving factors: ibuprofen or Tylenol as needed   PERTINENT HISTORY: Asthma, Stage III CKD, COPD, Depression, GERD, Heart Disease (Coronary Stent, Ablation, Heart Cath), Lumbar radiculopathy, Obesity, Type II DM, Recurrent DVT   PRECAUTIONS: Fall  WEIGHT BEARING RESTRICTIONS: No  FALLS:  Has patient fallen in last 6 months? Yes. Number of falls 4-6  LIVING ENVIRONMENT: Lives with: lives with their son and daughter-in-law Lives in: House/apartment Stairs: Yes:  External: uses a ramp that was built steps; none Has following equipment at home: Single point cane, Environmental consultant - 4 wheeled, Tour manager, Grab bars, and Ramped entry  OCCUPATION: does not work  PLOF: Needs assistance with ADLs and Needs assistance with transfers  PATIENT GOALS: "improve balance and stability"   OBJECTIVE:   DIAGNOSTIC FINDINGS:  07/22/22 MR Thoracic Spine w/wo Contrast IMPRESSION: 1. No acute findings or explanation for the patient's symptoms. 2. Chronic superior endplate compression deformity at L1. No acute osseous findings. 3. Mild thoracic spondylosis without cord deformity or high-grade spinal stenosis. 4. 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 5. Mild disc bulging and facet hypertrophy at L4-5 contributing to mild spinal stenosis with mild lateral recess and foraminal narrowing bilaterally.  07/22/22 MR Lumbar Spine w/wo Contrast IMPRESSION: 1. No acute findings or explanation for the patient's symptoms. 2. Chronic superior endplate compression deformity at L1. No acute osseous findings. 3.  Mild thoracic spondylosis without cord deformity or high-grade spinal stenosis. 4. 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 5. Mild disc bulging and facet hypertrophy at L4-5 contributing to mild spinal stenosis with mild lateral recess and foraminal narrowing bilaterally.  PATIENT SURVEYS:  ABC scale 60 / 1600 = 3.8 %  COGNITION: Overall cognitive status: Impaired and increased response time and delay with answer questions, occasional stuttering      SENSATION: Light touch: Impaired  Proprioception: Impaired   EDEMA:  NT  MUSCLE LENGTH: NT  POSTURE: rounded shoulders, forward head, and increased thoracic kyphosis  PALPATION: NT  LOWER EXTREMITY ROM:  Grossly limited by general  observation  LOWER EXTREMITY MMT: MMT increased back pain  MMT Right eval Left eval  Hip flexion 3 2+  Hip extension    Hip abduction 4-* 4-*  Hip adduction 4-* 4-*  Hip internal rotation 3+ 3+  Hip external rotation 3+ 3+  Knee flexion 4- 4-  Knee extension 4- 4-  Ankle dorsiflexion 4- 4-  Ankle plantarflexion    Ankle inversion    Ankle eversion     (Blank rows = not tested, *=modified to sitting position for testing)  LOWER EXTREMITY SPECIAL TESTS:  NT  FUNCTIONAL TESTS: *to be assessed on next visit* 5 times sit to stand: 34.69 sec with B UE assist; 30 sec B UE assist 10 meter walk test: 76.25 sec with rollator Berg Balance Scale: 21/56, < 36 high risk for falls (close to 100%)   (08/29/22) Gait speed: 0.43 ft/sec with rollator  GAIT: Distance walked: 90 Assistive device utilized: Walker - 4 wheeled Level of assistance: Modified independence Comments: decreased BLE, significantly decreased gait speed, decreased stride/step length BLE, forward flexed posture   TODAY'S TREATMENT:                                                                                                                              DATE:  09/25/22 THERAPEUTIC EXERCISE: to improve flexibility, strength and mobility.  Verbal and tactile cues throughout for technique.  Nustep L4x51min Seated hip ADD ball squeeze 2x10 5xSTS- 30 sec Assessed ability to transfer and gait 120' with 4WW- cues to increase heel strike  09/12/22 THERAPEUTIC EXERCISE: to improve flexibility, strength and mobility.  Verbal and tactile cues throughout for technique.  Gait 320 ft RW - cue for posture but pt unable d/t pain - O2 sats: 88% after first trial, 97% after second Seated hip ADD ball squeeze 10x5" Seated TrA activation 10x5" Seated LAQ x 10 bil Seated march x 10 bil Seated hip ABD RTB x 5 bil  09/05/22 THERAPEUTIC ACTIVITIES:  Reviewed rollator safety with proper positioning of rollator when preparing to sit as  well as safe hand placement and proper use of brakes during transfers.  THERAPEUTIC EXERCISE: to improve flexibility, strength and mobility.  Verbal and tactile cues throughout for technique.  NuStep -  L5 x 6 min; O2 sats after warmup: 99%, HR = 88 Hooklying LTR x 10 Hooklying L/R HS stretch with strap 10 x 10" Hooklying TrA isometric 1 x 5" Seated TrA isometric 5 x 5" Seated TrA + alt march x 5    PATIENT EDUCATION:  Education details: initial HEP, transfer safety, and gait safety with rollator   Person educated: Patient Education method: Explanation, Demonstration, and Verbal cues Education comprehension: verbalized understanding, returned demonstration, verbal cues required, and needs further education  HOME EXERCISE PROGRAM: Access Code: [email or web address] URL: [email or web address] Date: 09/05/2022 Prepared by: Glenetta Hew  Exercises - Seated Transversus Abdominis Bracing  - 1 x daily - 7 x weekly - 2 sets - 5 reps - 5 sec hold - Seated March  - 1 x daily - 7 x weekly - 2 sets - 5 reps - 3 sec hold - Seated Single Leg Hip Abduction  - 1 x daily - 7 x weekly - 2 sets - 5 reps - 3 sec hold  ASSESSMENT:  CLINICAL IMPRESSION: Mrs. Saephanh had a fall yesterday where she turned in the shower, slipped and fell back hitting her head. She was able to eventually get herself off of the ground. She was able to participate in objective test to assess progress with PT. 5x STS score has improved by 4 points. She demonstrates safety with transfers and gait using 4WW, limited to 120 ft of gait today. As of now she has met STG #2 and LTG #4, she is progressing with other goals. D/t recent fall, this has reversed the pain in her lower back. Suggested that she look into getting life alert necklace in case she would fall at home in future when son or daughter in law are not home. Pt would continue to benefit from continued PT to improve functional LE  strength to improve walking tolerance and  decrease risk of falls. She has missed a lot of PT d/t illness early on and I would suggest a recert of another 8 weeks for a frequency of 2x per week.   OBJECTIVE IMPAIRMENTS: Abnormal gait, cardiopulmonary status limiting activity, decreased activity tolerance, decreased balance, decreased cognition, decreased coordination, decreased endurance, decreased knowledge of condition, decreased knowledge of use of DME, decreased mobility, difficulty walking, decreased ROM, decreased strength, decreased safety awareness, dizziness, hypomobility, increased edema, increased fascial restrictions, impaired perceived functional ability, impaired flexibility, impaired sensation, impaired tone, postural dysfunction, and pain.   ACTIVITY LIMITATIONS: carrying, lifting, bending, sitting, standing, squatting, stairs, transfers, bed mobility, bathing, toileting, dressing, and locomotion level  PARTICIPATION LIMITATIONS: meal prep, cleaning, laundry, medication management, shopping, and community activity  PERSONAL FACTORS: Age, Education, Past/current experiences, Time since onset of injury/illness/exacerbation, and 3+ comorbidities: Asthma, Stage III CKD, COPD, Depression, GERD, Heart Disease (Coronary Stent, Ablation, Heart Cath), Lumbar radiculopathy, Obesity, Type II DM, Recurrent DVT  are also affecting patient's functional outcome.   REHAB POTENTIAL: Good  CLINICAL DECISION MAKING: Evolving/moderate complexity  EVALUATION COMPLEXITY: Moderate   GOALS: Goals reviewed with patient? Yes  SHORT TERM GOALS: Target date: 08/29/22, extended to 09/13/2022 due prolonged absence from PT   Pt will be independent with initial HEP.  Baseline: Goal status: MET- 09/12/22  2.  Pt will demonstrate safe techniques during transfers and gait using the rollator without needed cueing.  Baseline:  Goal status: MET - 09/25/22   LONG TERM GOALS: Target date: 09/26/22 extended to 11/20/22  Pt will be independent with  advanced/ongoing HEP to improve outcomes/carryover.  Baseline:  Goal  status: IN PROGRESS  2.  Pt will report at least 22% balance confidence on ABC scale to demonstrate improved confidence with functional mobility.   Baseline: 60 / 1600 = 3.8 % (MDIC 19%) Goal status: IN PROGRESS  3.  Pt will improve Berg Balance Score by 8 points to decrease risk of falls.  Baseline: 21/56 Goal status: IN PROGRESS  4.  Pt will demonstrate consistent safe ambulation and transfers techniques using Rollator to improve safety around the home and in the community.  Baseline:  Goal status: MET - 09/25/22  5.  Pt will amb >300 feet with normal gait pattern and LRAD to complete daily tasks around the home with increased endurance.  Baseline:  Goal status: IN PROGRESS- 09/25/22  6.  Pt will improve 5x STS time to </= 20 seconds to demonstrate improved functional strength and transfer efficiency.  Baseline: 34.69 sec with B UE assist Goal status: IN PROGRESS - 09/25/22 see under objective  7.  Pt will improve gait speed to >1.3 ft/sec to improve household ambulation with decreased fall risk.  Baseline: 0.43 ft/sec with rollator Goal status: IN PROGRESS  8.  Pt will report 50% improvement in low back and LE pain to improve QOL.  Baseline:  Goal status: IN PROGRESS    PLAN:  PT FREQUENCY: 2x/week  PT DURATION: 8 weeks  PLANNED INTERVENTIONS: Therapeutic exercises, Therapeutic activity, Neuromuscular re-education, Balance training, Gait training, Patient/Family education, Self Care, Joint mobilization, DME instructions, Aquatic Therapy, Dry Needling, Electrical stimulation, Cryotherapy, Moist heat, Compression bandaging, Vasopneumatic device, Ultrasound, Ionotophoresis 4mg /ml Dexamethasone, Manual therapy, and Re-evaluation  PLAN FOR NEXT SESSION: ABC scale and BERG test; vital sign check throughout session, handout on safety in the home/DME, progress general low back and LE ROM/strengthening exercises as  tolerated - review/update HEP as indicated, static and dynamic balance intervention    Artist Pais, PTA 09/25/2022, 5:15 PM

## 2022-09-26 NOTE — Addendum Note (Signed)
Addended by: Percival Spanish on: 09/26/2022 12:40 PM   Modules accepted: Orders

## 2022-10-01 ENCOUNTER — Ambulatory Visit: Payer: Medicare Other | Admitting: Physical Therapy

## 2022-10-09 ENCOUNTER — Ambulatory Visit: Payer: Medicare Other

## 2022-10-15 ENCOUNTER — Ambulatory Visit: Payer: Medicare Other | Admitting: Physical Therapy

## 2022-10-21 ENCOUNTER — Ambulatory Visit: Payer: Medicare Other

## 2022-10-24 ENCOUNTER — Encounter: Payer: Medicare Other | Admitting: Physical Therapy

## 2022-10-28 ENCOUNTER — Ambulatory Visit: Payer: Medicare Other | Attending: Internal Medicine | Admitting: Physical Therapy

## 2022-10-28 ENCOUNTER — Encounter: Payer: Self-pay | Admitting: Physical Therapy

## 2022-10-28 DIAGNOSIS — M79605 Pain in left leg: Secondary | ICD-10-CM | POA: Insufficient documentation

## 2022-10-28 DIAGNOSIS — R2689 Other abnormalities of gait and mobility: Secondary | ICD-10-CM | POA: Diagnosis present

## 2022-10-28 DIAGNOSIS — M79604 Pain in right leg: Secondary | ICD-10-CM | POA: Diagnosis present

## 2022-10-28 DIAGNOSIS — R2681 Unsteadiness on feet: Secondary | ICD-10-CM | POA: Insufficient documentation

## 2022-10-28 DIAGNOSIS — M6281 Muscle weakness (generalized): Secondary | ICD-10-CM | POA: Diagnosis present

## 2022-10-28 NOTE — Therapy (Signed)
OUTPATIENT PHYSICAL THERAPY TREATMENT     Patient Name: Mary Ferguson MRN: [id number] DOB:[birth year], 72 y.o., female Today's Date: 10/28/2022  END OF SESSION:  PT End of Session - 10/28/22 1532     Visit Number 6    Date for PT Re-Evaluation 11/20/22    Authorization Type Medicare and BCBS Supplement    PT Start Time 1532    PT Stop Time 1618    PT Time Calculation (min) 46 min    Activity Tolerance Patient tolerated treatment well;Patient limited by pain    Behavior During Therapy [facility] for tasks assessed/performed                Past Medical History:  Diagnosis Date   Acute and chronic respiratory failure with hypoxia ([place]) 01/24/2018   Acute diverticulitis 07/20/2022   Acute kidney injury superimposed on chronic kidney disease ([place]) 07/21/2022   Asthma    Blood in stool    Chicken pox    Chronic generalized abdominal pain    Chronic pain    CKD (chronic kidney disease), stage III (HCC)    Colon polyps    COPD (chronic obstructive pulmonary disease) ([place])    COVID-19 2020   Depression    Diverticulitis    Very severe.  Has had 2 bowel resections and temporary colostomy in the past.   Fall at home, initial encounter 07/21/2022   Fecal incontinence    Frequent headaches    GERD (gastroesophageal reflux disease)    Heart attack ([place])    Heart disease    High anion gap metabolic acidosis [id number]   History of blood transfusion    Hyperlipemia    Hypertension    Incontinence of feces 12/11/2016   Morbid obesity ([place])    Pneumonia due to infectious organism 01/12/2018   Postmenopausal 03/21/2021   PTSD (post-traumatic stress disorder)    Recurrent deep vein thrombosis (DVT) (HCC)    x6   Rhabdomyolysis 07/21/2022   Urinary incontinence    Past Surgical History:  Procedure Laterality Date   ABDOMINAL HYSTERECTOMY  1995   APPENDECTOMY     CATARACT EXTRACTION Bilateral    CESAREAN SECTION  1984   CHOLECYSTECTOMY  2016   COLOSTOMY     and reversal    CORONARY STENT INTERVENTION N/A 06/18/2019   Procedure: CORONARY STENT INTERVENTION;  Surgeon: Wellington Hampshire, MD;  Location: [facility];  Service: Cardiovascular;  Laterality: N/A;   [place] and 2016 (2016 ventral w/ incarceration) 33 surgeries   HYSTEROSCOPY WITH D & C     13   LAPAROSCOPIC LYSIS OF ADHESIONS     x2   LEFT HEART CATH AND CORONARY ANGIOGRAPHY N/A 06/18/2019   Procedure: LEFT HEART CATH AND CORONARY ANGIOGRAPHY;  Surgeon: Wellington Hampshire, MD;  Location: [place] [facility];  Service: Cardiovascular;  Laterality: N/A;   LEFT HEART CATH AND CORONARY ANGIOGRAPHY N/A 11/30/2019   Procedure: LEFT HEART CATH AND CORONARY ANGIOGRAPHY;  Surgeon: Troy Sine, MD;  Location: [place] [facility];  Service: Cardiovascular;  Laterality: N/A;   NASAL RECONSTRUCTION  04-19-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   TONSILLECTOMY  1959   VEIN SURGERY     laser x2   Patient Active Problem List   Diagnosis Date Noted   Elevated liver enzymes 07/21/2022   Chronic venous insufficiency 01/08/2022   Irritable bowel syndrome with diarrhea 06/21/2021   Imdur therapy-Coronary artery  disease with angina pectoris ([place]) 03/22/2021   Compression fracture of L1 lumbar vertebra (HCC) 03/22/2021   Lumbar radiculopathy 03/22/2021   Neuropathy 03/22/2021   Paresthesia of bilateral legs 03/22/2021   Aortic atherosclerosis ([place]) 03/22/2021   Type II diabetes mellitus with manifestations ([place]) 03/21/2021   COPD (chronic obstructive pulmonary disease) ([place]) 03/21/2021   CAD S/P PCI 06/30/2019   Dyslipidemia, goal LDL below 70 06/30/2019   Morbid obesity ([place])    Essential hypertension    History of non-ST elevation myocardial infarction (NSTEMI) 06/17/2019   Acute on chronic respiratory failure with hypoxemia ([place]) 01/24/2018   Mild episode of recurrent major depressive disorder ([place]) 03/25/2017   CKD (chronic kidney disease) stage 3, GFR 30-59 ml/min (HCC)  03/19/2017   Vitamin D deficiency 03/19/2017   B12 deficiency 03/19/2017   Nocturnal oxygen desaturation 12/11/2016   BMI 45.0-49.9, adult ([place]) 12/11/2016   Ambulates with cane 12/11/2016   Chronic pruritic rash in adult 07/08/2016   Chronic pain syndrome 03/13/2015   Abdominal wall hernia 01/27/2014   Obstructive sleep apnea (adult) (pediatric) 08/05/2013   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[place]) 02/18/2013   Moderate persistent asthma 12/30/2012   History of recurrent deep vein thrombosis (DVT) 12/30/2012    PCP: Ma Hillock, DO  REFERRING PROVIDER: Debbe Odea, MD  REFERRING DIAG: [phone] (ICD-10-CM) - Rhabdomyolysis   THERAPY DIAG:  Unsteadiness on feet  Other abnormalities of gait and mobility  Muscle weakness (generalized)  Pain in both lower extremities  RATIONALE FOR EVALUATION AND TREATMENT: Rehabilitation  ONSET DATE: July 18, 2022  NEXT MD VISIT: 09/09/22 with PCP   SUBJECTIVE:   SUBJECTIVE STATEMENT: Pt reports she has another fall last Thursday when she misjudged where she was in relation to the chair. She did not seek medical attention but notes all-over soreness today.  PAIN:  Are you having pain? Yes: NPRS scale: overall 7/10 but back 8/10 Pain location: Low back, abdomen, feet and L shoulder - as a result of the fall Pain description: stabbing, stinging Aggravating factors: everything Relieving factors: ibuprofen or Tylenol as needed   PERTINENT HISTORY: Asthma, Stage III CKD, COPD, Depression, GERD, Heart Disease (Coronary Stent, Ablation, Heart Cath), Lumbar radiculopathy, Obesity, Type II DM, Recurrent DVT   PRECAUTIONS: Fall  WEIGHT BEARING RESTRICTIONS: No  FALLS:  Has patient fallen in last 6 months? Yes. Number of falls 4-6  LIVING ENVIRONMENT: Lives with: lives with their son and daughter-in-law Lives in: House/apartment Stairs: Yes: External: uses a ramp that was built steps; none Has following equipment at home: Single  point cane, Environmental consultant - 4 wheeled, Electronics engineer, Grab bars, and Ramped entry  OCCUPATION: does not work  PLOF: Needs assistance with ADLs and Needs assistance with transfers  PATIENT GOALS: "improve balance and stability"   OBJECTIVE:   DIAGNOSTIC FINDINGS:  07/22/22 MR Thoracic Spine w/wo Contrast IMPRESSION: 1. No acute findings or explanation for the patient's symptoms. 2. Chronic superior endplate compression deformity at L1. No acute osseous findings. 3. Mild thoracic spondylosis without cord deformity or high-grade spinal stenosis. 4. 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 5. Mild disc bulging and facet hypertrophy at L4-5 contributing to mild spinal stenosis with mild lateral recess and foraminal narrowing bilaterally.  07/22/22 MR Lumbar Spine w/wo Contrast IMPRESSION: 1. No acute findings or explanation for the patient's symptoms. 2. Chronic superior endplate compression deformity at L1. No acute osseous findings. 3. Mild thoracic spondylosis without cord deformity or  high-grade spinal stenosis. 4. 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 5. Mild disc bulging and facet hypertrophy at L4-5 contributing to mild spinal stenosis with mild lateral recess and foraminal narrowing bilaterally.  PATIENT SURVEYS:  ABC scale 60 / 1600 = 3.8 %  COGNITION: Overall cognitive status: Impaired and increased response time and delay with answer questions, occasional stuttering      SENSATION: Light touch: Impaired  Proprioception: Impaired   EDEMA:  NT  MUSCLE LENGTH: NT  POSTURE: rounded shoulders, forward head, and increased thoracic kyphosis  PALPATION: NT  LOWER EXTREMITY ROM:  Grossly limited by general observation  LOWER EXTREMITY MMT: MMT increased back pain  MMT Right eval Left eval Right 10/28/22  Left 10/28/22  Hip flexion 3 2+ 3+ 4-  Hip extension   4- * 4- *  Hip abduction 4-* 4-* 4 * 4 *  Hip adduction 4-* 4-* 4 * 4 *  Hip internal rotation 3+ 3+ 4- 3+  Hip external rotation 3+ 3+ 3+ 3+  Knee flexion 4- 4- 4 4-  Knee extension 4- 4- 4 4  Ankle dorsiflexion 4- 4- 4 4  Ankle plantarflexion      Ankle inversion      Ankle eversion       (Blank rows = not tested, * = modified to sitting position for testing)  LOWER EXTREMITY SPECIAL TESTS:  NT  FUNCTIONAL TESTS:  5 times sit to stand: 34.69 sec with B UE assist (eval); 30 sec B UE assist (09/25/22) 10 meter walk test: 76.25 sec with rollator Berg Balance Scale: 21/56, < 36 high risk for falls (close to 100%)   (08/29/22) Gait speed: 0.43 ft/sec with rollator  10/28/22: 10MWT: 25.87 sec with rollator; 28.94 sec with SPC Gait speed: 1.27 ft/sec with rollator; 1.13 ft/sec with SPC 5xSTS: 44.03 sec w/ hand from thighs  GAIT: Distance walked: 90 Assistive device utilized: Environmental consultant - 4 wheeled Level of assistance: Modified independence Comments: decreased BLE, significantly decreased gait speed, decreased stride/step length BLE, forward flexed posture   TODAY'S TREATMENT:                                                                                                                              DATE:   10/28/22 THERAPEUTIC EXERCISE: to improve flexibility, strength and mobility.  Verbal and tactile cues throughout for technique.  NuStep - L4 x 7 min Seated TrA isometric 10 x 5" Seated TrA + alt march with RTB folded around and over legs x 10 Seated TrA + alt hip ABD/ER with RTB folded around and over legs x 10 - pt reporting discomfort in R groin but resolved following R hip adductor stretch R hip adductor stretch in sitting 1 x 30"  THERAPEUTIC ACTIVITIES:  LE MMT 10MWT: 25.87 sec with rollator; 28.94 sec with SPC Gait speed: 1.27 ft/sec with rollator; 1.13 ft/sec with SPC 5xSTS:  44.03 sec w/ hand from  thighs    09/25/22 THERAPEUTIC EXERCISE: to improve flexibility, strength and mobility.  Verbal and tactile cues throughout for technique.  Nustep L4x66mn Seated hip ADD ball squeeze 2x10 5xSTS- 30 sec Assessed ability to transfer and gait 120' with 4WW- cues to increase heel strike    09/12/22 THERAPEUTIC EXERCISE: to improve flexibility, strength and mobility.  Verbal and tactile cues throughout for technique.  Gait 320 ft RW - cue for posture but pt unable d/t pain - O2 sats: 88% after first trial, 97% after second Seated hip ADD ball squeeze 10x5" Seated TrA activation 10x5" Seated LAQ x 10 bil Seated march x 10 bil Seated hip ABD RTB x 5 bil   PATIENT EDUCATION:  Education details: HEP update - RTB added to seated march and clams, transfer safety, and gait safety with rollator   Person educated: Patient Education method: Explanation, Demonstration, and Verbal cues Education comprehension: verbalized understanding, returned demonstration, verbal cues required, and needs further education  HOME EXERCISE PROGRAM: Access Code: [email or web address] URL: [email or web address] Date: 09/05/2022 Prepared by: JAnnie Paras Exercises - Seated Transversus Abdominis Bracing  - 1 x daily - 7 x weekly - 2 sets - 5 reps - 5 sec hold - Seated March  - 1 x daily - 7 x weekly - 2 sets - 5 reps - 3 sec hold - Seated Single Leg Hip Abduction  - 1 x daily - 7 x weekly - 2 sets - 5 reps - 3 sec hold  ASSESSMENT:  CLINICAL IMPRESSION: LRollareturns to PT after >1 month absence due to repeated cancellations, the most recent being secondary to another fall last Thursday at home. She reports increased overall soreness since the fall with increase in low back pain. Despite extended absence from PT, she has demonstrated some improvement in her LE strength as well as her mobility with sit to stand transfers and gait. She was able to complete the 5xSTS today w/o need for armrests on chair, using  only her hands on her thighs, although she did require increased time due to need for multiple attempts on a few reps. She continues to require cues for safe transfers in relation to hand placement, rollator alignment and proper use of rollator brakes which may be contributing to her recurrent falls at home, therefore reviewed safe transfer techniques as provided with prior HEP handout. Her gait speed has significantly improved from 0.43 ft/sec to 1.27 ft/sec with rollator and 1.13 ft/sec with SPC (tested secondary to pt reporting that she often uses her SPC in the home in place of the rollator due to limited space). Based on current assessment results, continued to reinforce need for the rollator for ambulation [facility]. Current HEP advanced with addition of RTB resistance, but LGlaydswill benefit from expansion of her HEP to include stretches for her back to help reduce pain/soreness from recent fall and well as progression of  general low back and LE ROM/strengthening exercises as tolerated.   OBJECTIVE IMPAIRMENTS: Abnormal gait, cardiopulmonary status limiting activity, decreased activity tolerance, decreased balance, decreased cognition, decreased coordination, decreased endurance, decreased knowledge of condition, decreased knowledge of use of DME, decreased mobility, difficulty walking, decreased ROM, decreased strength, decreased safety awareness, dizziness, hypomobility, increased edema, increased fascial restrictions, impaired perceived functional ability, impaired flexibility, impaired sensation, impaired tone, postural dysfunction, and pain.   ACTIVITY LIMITATIONS: carrying, lifting, bending, sitting, standing, squatting, stairs, transfers, bed mobility, bathing, toileting, dressing,  and locomotion level  PARTICIPATION LIMITATIONS: meal prep, cleaning, laundry, medication management, shopping, and community activity  PERSONAL FACTORS: Age, Education, Past/current  experiences, Time since onset of injury/illness/exacerbation, and 3+ comorbidities: Asthma, Stage III CKD, COPD, Depression, GERD, Heart Disease (Coronary Stent, Ablation, Heart Cath), Lumbar radiculopathy, Obesity, Type II DM, Recurrent DVT  are also affecting patient's functional outcome.   REHAB POTENTIAL: Good  CLINICAL DECISION MAKING: Evolving/moderate complexity  EVALUATION COMPLEXITY: Moderate   GOALS: Goals reviewed with patient? Yes  SHORT TERM GOALS: Target date: 08/29/22, extended to 09/13/2022 due prolonged absence from PT   Pt will be independent with initial HEP.  Baseline: Goal status: MET- 09/12/22  2.  Pt will demonstrate safe techniques during transfers and gait using the rollator without needed cueing.  Baseline:  Goal status: MET - 09/25/22   LONG TERM GOALS: Target date: 09/26/22 extended to 11/20/22  Pt will be independent with advanced/ongoing HEP to improve outcomes/carryover.  Baseline:  Goal status: IN PROGRESS  2.  Pt will report at least 22% balance confidence on ABC scale to demonstrate improved confidence with functional mobility.   Baseline: 60 / 1600 = 3.8 % (MDIC 19%) Goal status: IN PROGRESS  3.  Pt will improve Berg Balance Score by 8 points to decrease risk of falls.  Baseline: 21/56 Goal status: IN PROGRESS  4.  Pt will demonstrate consistent safe ambulation and transfers techniques using Rollator to improve safety around the home and in the community.  Baseline:  Goal status: IN PROGRESS  10/28/22 - cues required for proper use of rollator brakes and positioning relative to seat for transfers  5.  Pt will amb >300 feet with normal gait pattern and LRAD to complete daily tasks around the home with increased endurance.  Baseline:  Goal status: IN PROGRESS  09/25/22  6.  Pt will improve 5x STS time to </= 20 seconds to demonstrate improved functional strength and transfer efficiency.  Baseline: 34.69 sec with B UE assist; 30 sec with B UE assist  (09/25/22) Goal status: IN PROGRESS  10/28/22 - 44.03 sec with B hands on thighs  7.  Pt will improve gait speed to >1.3 ft/sec to improve household ambulation with decreased fall risk.  Baseline: 0.43 ft/sec with rollator Goal status: IN PROGRESS  10/28/22 - 1.27 ft/sec with rollator; 1.13 ft/sec with SPC  8.  Pt will report 50% improvement in low back and LE pain to improve QOL.  Baseline:  Goal status: IN PROGRESS  10/28/22 - increased pain as a result of recent fall    PLAN:  PT FREQUENCY: 2x/week  PT DURATION: 8 weeks  PLANNED INTERVENTIONS: Therapeutic exercises, Therapeutic activity, Neuromuscular re-education, Balance training, Gait training, Patient/Family education, Self Care, Joint mobilization, DME instructions, Aquatic Therapy, Dry Needling, Electrical stimulation, Cryotherapy, Moist heat, Compression bandaging, Vasopneumatic device, Ultrasound, Ionotophoresis '4mg'$ /ml Dexamethasone, Manual therapy, and Re-evaluation  PLAN FOR NEXT SESSION: vital sign check as indicated (supposed to be on home O2 but does not come to PT on O2); handout on safety in the home/DME; introduce stretches for back to help reduce pain/soreness from recent fall; progress general low back and LE ROM/strengthening exercises as tolerated - review/update HEP as indicated; static and dynamic balance training; re-assess remaining LTGs - ABC scale and BERG test    Percival Spanish, PT 10/28/2022, 6:44 PM

## 2022-10-31 ENCOUNTER — Ambulatory Visit: Payer: Medicare Other

## 2022-10-31 DIAGNOSIS — R2681 Unsteadiness on feet: Secondary | ICD-10-CM | POA: Diagnosis not present

## 2022-10-31 DIAGNOSIS — M79604 Pain in right leg: Secondary | ICD-10-CM

## 2022-10-31 DIAGNOSIS — M6281 Muscle weakness (generalized): Secondary | ICD-10-CM

## 2022-10-31 DIAGNOSIS — R2689 Other abnormalities of gait and mobility: Secondary | ICD-10-CM

## 2022-10-31 NOTE — Therapy (Signed)
OUTPATIENT PHYSICAL THERAPY TREATMENT     Patient Name: Mary Ferguson MRN: [id number] DOB:1951-06-05, 72 y.o., female Today's Date: 10/31/2022  END OF SESSION:  PT End of Session - 10/31/22 1621     Visit Number 7    Date for PT Re-Evaluation 11/20/22    Authorization Type Medicare and BCBS Supplement    PT Start Time [id number]    PT Stop Time 1619    PT Time Calculation (min) 48 min    Activity Tolerance Patient tolerated treatment well;Patient limited by pain    Behavior During Therapy [facility] for tasks assessed/performed                 Past Medical History:  Diagnosis Date   Acute and chronic respiratory failure with hypoxia ([place]) 01/24/2018   Acute diverticulitis 07/20/2022   Acute kidney injury superimposed on chronic kidney disease ([place]) 07/21/2022   Asthma    Blood in stool    Chicken pox    Chronic generalized abdominal pain    Chronic pain    CKD (chronic kidney disease), stage III (HCC)    Colon polyps    COPD (chronic obstructive pulmonary disease) ([place])    COVID-19 2020   Depression    Diverticulitis    Very severe.  Has had 2 bowel resections and temporary colostomy in the past.   Fall at home, initial encounter 07/21/2022   Fecal incontinence    Frequent headaches    GERD (gastroesophageal reflux disease)    Heart attack ([place])    Heart disease    High anion gap metabolic acidosis [id number]   History of blood transfusion    Hyperlipemia    Hypertension    Incontinence of feces 12/11/2016   Morbid obesity ([place])    Pneumonia due to infectious organism 01/12/2018   Postmenopausal 03/21/2021   PTSD (post-traumatic stress disorder)    Recurrent deep vein thrombosis (DVT) (HCC)    x6   Rhabdomyolysis 07/21/2022   Urinary incontinence    Past Surgical History:  Procedure Laterality Date   ABDOMINAL HYSTERECTOMY  1995   APPENDECTOMY     CATARACT EXTRACTION Bilateral    CESAREAN SECTION  1984   CHOLECYSTECTOMY  2016   COLOSTOMY     and reversal    CORONARY STENT INTERVENTION N/A 06/18/2019   Procedure: CORONARY STENT INTERVENTION;  Surgeon: Wellington Hampshire, MD;  Location: [facility];  Service: Cardiovascular;  Laterality: N/A;   [place] and 2016 (2016 ventral w/ incarceration) 33 surgeries   HYSTEROSCOPY WITH D & C     13   LAPAROSCOPIC LYSIS OF ADHESIONS     x2   LEFT HEART CATH AND CORONARY ANGIOGRAPHY N/A 06/18/2019   Procedure: LEFT HEART CATH AND CORONARY ANGIOGRAPHY;  Surgeon: Wellington Hampshire, MD;  Location: [facility];  Service: Cardiovascular;  Laterality: N/A;   LEFT HEART CATH AND CORONARY ANGIOGRAPHY N/A 11/30/2019   Procedure: LEFT HEART CATH AND CORONARY ANGIOGRAPHY;  Surgeon: Troy Sine, MD;  Location: [facility];  Service: Cardiovascular;  Laterality: N/A;   NASAL RECONSTRUCTION  November 03, 1951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   TONSILLECTOMY  1959   VEIN SURGERY     laser x2   Patient Active Problem List   Diagnosis Date Noted   Elevated liver enzymes 07/21/2022   Chronic venous insufficiency 01/08/2022   Irritable bowel syndrome with diarrhea 06/21/2021   Imdur therapy-Coronary  artery disease with angina pectoris ([place]) 03/22/2021   Compression fracture of L1 lumbar vertebra (HCC) 03/22/2021   Lumbar radiculopathy 03/22/2021   Neuropathy 03/22/2021   Paresthesia of bilateral legs 03/22/2021   Aortic atherosclerosis ([place]) 03/22/2021   Type II diabetes mellitus with manifestations ([place]) 03/21/2021   COPD (chronic obstructive pulmonary disease) ([place]) 03/21/2021   CAD S/P PCI 06/30/2019   Dyslipidemia, goal LDL below 70 06/30/2019   Morbid obesity ([place])    Essential hypertension    History of non-ST elevation myocardial infarction (NSTEMI) 06/17/2019   Acute on chronic respiratory failure with hypoxemia ([place]) 01/24/2018   Mild episode of recurrent major depressive disorder ([place]) 03/25/2017   CKD (chronic kidney disease) stage 3, GFR 30-59 ml/min (HCC)  03/19/2017   Vitamin D deficiency 03/19/2017   B12 deficiency 03/19/2017   Nocturnal oxygen desaturation 12/11/2016   BMI 45.0-49.9, adult (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[place]) 02/18/2013   Moderate persistent asthma 12/30/2012   History of recurrent deep vein thrombosis (DVT) 12/30/2012    PCP: Ma Hillock, DO  REFERRING PROVIDER: Debbe Odea, MD  REFERRING DIAG: [phone] (ICD-10-CM) - Rhabdomyolysis   THERAPY DIAG:  Unsteadiness on feet  Other abnormalities of gait and mobility  Muscle weakness (generalized)  Pain in both lower extremities  RATIONALE FOR EVALUATION AND TREATMENT: Rehabilitation  ONSET DATE: July 18, 2022  NEXT MD VISIT: 09/09/22 with PCP   SUBJECTIVE:   SUBJECTIVE STATEMENT: Pt denies falls since last visit but her back feels "caught", as she describes it.  PAIN:  Are you having pain? Yes: NPRS scale:  8/10 Pain location: Low back, abdomen, feet and L shoulder - as a result of the fall Pain description: stabbing, stinging Aggravating factors: everything Relieving factors: ibuprofen or Tylenol as needed   PERTINENT HISTORY: Asthma, Stage III CKD, COPD, Depression, GERD, Heart Disease (Coronary Stent, Ablation, Heart Cath), Lumbar radiculopathy, Obesity, Type II DM, Recurrent DVT   PRECAUTIONS: Fall  WEIGHT BEARING RESTRICTIONS: No  FALLS:  Has patient fallen in last 6 months? Yes. Number of falls 4-6  LIVING ENVIRONMENT: Lives with: lives with their son and daughter-in-law Lives in: House/apartment Stairs: Yes: External: uses a ramp that was built steps; none Has following equipment at home: Single point cane, Environmental consultant - 4 wheeled, Electronics engineer, Grab bars, and Ramped entry  OCCUPATION: does not work  PLOF:  Needs assistance with ADLs and Needs assistance with transfers  PATIENT GOALS: "improve balance and stability"   OBJECTIVE:   DIAGNOSTIC FINDINGS:  07/22/22 MR Thoracic Spine w/wo Contrast IMPRESSION: 1. No acute findings or explanation for the patient's symptoms. 2. Chronic superior endplate compression deformity at L1. No acute osseous findings. 3. Mild thoracic spondylosis without cord deformity or high-grade spinal stenosis. 4. 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 5. Mild disc bulging and facet hypertrophy at L4-5 contributing to mild spinal stenosis with mild lateral recess and foraminal narrowing bilaterally.  07/22/22 MR Lumbar Spine w/wo Contrast IMPRESSION: 1. No acute findings or explanation for the patient's symptoms. 2. Chronic superior endplate compression deformity at L1. No acute osseous findings. 3. Mild thoracic spondylosis without cord deformity or high-grade spinal stenosis. 4. 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 5. Mild disc bulging and facet hypertrophy at L4-5 contributing to mild spinal stenosis with mild lateral recess and foraminal narrowing bilaterally.  PATIENT SURVEYS:  ABC scale 60 / 1600 = 3.8 %  COGNITION: Overall cognitive status: Impaired and increased response time and delay with answer questions, occasional stuttering      SENSATION: Light touch: Impaired  Proprioception: Impaired   EDEMA:  NT  MUSCLE LENGTH: NT  POSTURE: rounded shoulders, forward head, and increased thoracic kyphosis  PALPATION: NT  LOWER EXTREMITY ROM:  Grossly limited by general observation  LOWER EXTREMITY MMT: MMT increased back pain  MMT Right eval Left eval Right 10/28/22 Left 10/28/22  Hip flexion 3 2+ 3+ 4-  Hip extension   4- * 4- *  Hip abduction 4-* 4-* 4 * 4 *  Hip adduction  4-* 4-* 4 * 4 *  Hip internal rotation 3+ 3+ 4- 3+  Hip external rotation 3+ 3+ 3+ 3+  Knee flexion 4- 4- 4 4-  Knee extension 4- 4- 4 4  Ankle dorsiflexion 4- 4- 4 4  Ankle plantarflexion      Ankle inversion      Ankle eversion       (Blank rows = not tested, * = modified to sitting position for testing)  LOWER EXTREMITY SPECIAL TESTS:  NT  FUNCTIONAL TESTS:  5 times sit to stand: 34.69 sec with B UE assist (eval); 30 sec B UE assist (09/25/22) 10 meter walk test: 76.25 sec with rollator Berg Balance Scale: 21/56, < 36 high risk for falls (close to 100%)   (08/29/22) Gait speed: 0.43 ft/sec with rollator  10/28/22: 10MWT: 25.87 sec with rollator; 28.94 sec with SPC Gait speed: 1.27 ft/sec with rollator; 1.13 ft/sec with SPC 5xSTS: 44.03 sec w/ hand from thighs  GAIT: Distance walked: 90 Assistive device utilized: Environmental consultant - 4 wheeled Level of assistance: Modified independence Comments: decreased BLE, significantly decreased gait speed, decreased stride/step length BLE, forward flexed posture   TODAY'S TREATMENT:                                                                                                                              DATE:  10/31/22 THERAPEUTIC EXERCISE: to improve flexibility, strength and mobility.  Verbal and tactile cues throughout for technique.  NuStep - L4 x 8 min Seated TrA isometric 10x Seated lumbar flexion stretch 2x30 sec with green pball  Seated LAQ x 10 bil 2# Seated march x 5 bil  Seated HS curl RTB x 10 bil Standing heel raise x 10 bil with rollator Seated chest press with yellow weight ballx 10  Gait 140 ft rollator SBA  10/28/22 THERAPEUTIC EXERCISE: to improve flexibility, strength and mobility.  Verbal and tactile cues throughout for technique.  NuStep - L4 x 7 min Seated TrA isometric 10 x 5" Seated TrA + alt march with RTB folded around and over legs x 10 Seated TrA +  alt hip ABD/ER with RTB folded around and over legs x 10 - pt  reporting discomfort in R groin but resolved following R hip adductor stretch R hip adductor stretch in sitting 1 x 30"  THERAPEUTIC ACTIVITIES:  LE MMT 10MWT: 25.87 sec with rollator; 28.94 sec with SPC Gait speed: 1.27 ft/sec with rollator; 1.13 ft/sec with SPC 5xSTS: 44.03 sec w/ hand from thighs    09/25/22 THERAPEUTIC EXERCISE: to improve flexibility, strength and mobility.  Verbal and tactile cues throughout for technique.  Nustep L4x44mn Seated hip ADD ball squeeze 2x10 5xSTS- 30 sec Assessed ability to transfer and gait 120' with 4WW- cues to increase heel strike    09/12/22 THERAPEUTIC EXERCISE: to improve flexibility, strength and mobility.  Verbal and tactile cues throughout for technique.  Gait 320 ft RW - cue for posture but pt unable d/t pain - O2 sats: 88% after first trial, 97% after second Seated hip ADD ball squeeze 10x5" Seated TrA activation 10x5" Seated LAQ x 10 bil Seated march x 10 bil Seated hip ABD RTB x 5 bil   PATIENT EDUCATION:  Education details: HEP update - RTB added to seated march and clams, transfer safety, and gait safety with rollator   Person educated: Patient Education method: Explanation, Demonstration, and Verbal cues Education comprehension: verbalized understanding, returned demonstration, verbal cues required, and needs further education  HOME EXERCISE PROGRAM: Access Code: [email or web address] URL: [email or web address] Date: 09/05/2022 Prepared by: JAnnie Paras Exercises - Seated Transversus Abdominis Bracing  - 1 x daily - 7 x weekly - 2 sets - 5 reps - 5 sec hold - Seated March  - 1 x daily - 7 x weekly - 2 sets - 5 reps - 3 sec hold - Seated Single Leg Hip Abduction  - 1 x daily - 7 x weekly - 2 sets - 5 reps - 3 sec hold  ASSESSMENT:  CLINICAL IMPRESSION: Progressed exercises working on proximal LE strengthening, core strengthening, and lumbar flexibility. Pt with good response to flexion stretch in sitting so added  this to HEP. She was able to do heel raises in standing but after 10 reps noted some increased LBP. Able to walk 140 ft with rollator but after about 90 ft she became fatigued, noted with increased trunk flexion and shortened strides. Pt would continue to benefit from skilled therapy.  OBJECTIVE IMPAIRMENTS: Abnormal gait, cardiopulmonary status limiting activity, decreased activity tolerance, decreased balance, decreased cognition, decreased coordination, decreased endurance, decreased knowledge of condition, decreased knowledge of use of DME, decreased mobility, difficulty walking, decreased ROM, decreased strength, decreased safety awareness, dizziness, hypomobility, increased edema, increased fascial restrictions, impaired perceived functional ability, impaired flexibility, impaired sensation, impaired tone, postural dysfunction, and pain.   ACTIVITY LIMITATIONS: carrying, lifting, bending, sitting, standing, squatting, stairs, transfers, bed mobility, bathing, toileting, dressing, and locomotion level  PARTICIPATION LIMITATIONS: meal prep, cleaning, laundry, medication management, shopping, and community activity  PERSONAL FACTORS: Age, Education, Past/current experiences, Time since onset of injury/illness/exacerbation, and 3+ comorbidities: Asthma, Stage III CKD, COPD, Depression, GERD, Heart Disease (Coronary Stent, Ablation, Heart Cath), Lumbar radiculopathy, Obesity, Type II DM, Recurrent DVT  are also affecting patient's functional outcome.   REHAB POTENTIAL: Good  CLINICAL DECISION MAKING: Evolving/moderate complexity  EVALUATION COMPLEXITY: Moderate   GOALS: Goals reviewed with patient? Yes  SHORT TERM GOALS: Target date: 08/29/22, extended to 09/13/2022 due prolonged absence from PT   Pt will be independent with initial HEP.  Baseline: Goal status: MET- 09/12/22  2.  Pt will demonstrate  safe techniques during transfers and gait using the rollator without needed cueing.  Baseline:   Goal status: MET - 09/25/22   LONG TERM GOALS: Target date: 09/26/22 extended to 11/20/22  Pt will be independent with advanced/ongoing HEP to improve outcomes/carryover.  Baseline:  Goal status: IN PROGRESS  2.  Pt will report at least 22% balance confidence on ABC scale to demonstrate improved confidence with functional mobility.   Baseline: 60 / 1600 = 3.8 % (MDIC 19%) Goal status: IN PROGRESS  3.  Pt will improve Berg Balance Score by 8 points to decrease risk of falls.  Baseline: 21/56 Goal status: IN PROGRESS  4.  Pt will demonstrate consistent safe ambulation and transfers techniques using Rollator to improve safety around the home and in the community.  Baseline:  Goal status: IN PROGRESS  10/28/22 - cues required for proper use of rollator brakes and positioning relative to seat for transfers  5.  Pt will amb >300 feet with normal gait pattern and LRAD to complete daily tasks around the home with increased endurance.  Baseline:  Goal status: IN PROGRESS  09/25/22  6.  Pt will improve 5x STS time to </= 20 seconds to demonstrate improved functional strength and transfer efficiency.  Baseline: 34.69 sec with B UE assist; 30 sec with B UE assist (09/25/22) Goal status: IN PROGRESS  10/28/22 - 44.03 sec with B hands on thighs  7.  Pt will improve gait speed to >1.3 ft/sec to improve household ambulation with decreased fall risk.  Baseline: 0.43 ft/sec with rollator Goal status: IN PROGRESS  10/28/22 - 1.27 ft/sec with rollator; 1.13 ft/sec with SPC  8.  Pt will report 50% improvement in low back and LE pain to improve QOL.  Baseline:  Goal status: IN PROGRESS  10/28/22 - increased pain as a result of recent fall    PLAN:  PT FREQUENCY: 2x/week  PT DURATION: 8 weeks  PLANNED INTERVENTIONS: Therapeutic exercises, Therapeutic activity, Neuromuscular re-education, Balance training, Gait training, Patient/Family education, Self Care, Joint mobilization, DME instructions, Aquatic  Therapy, Dry Needling, Electrical stimulation, Cryotherapy, Moist heat, Compression bandaging, Vasopneumatic device, Ultrasound, Ionotophoresis '4mg'$ /ml Dexamethasone, Manual therapy, and Re-evaluation  PLAN FOR NEXT SESSION: vital sign check as indicated (supposed to be on home O2 but does not come to PT on O2); handout on safety in the home/DME; introduce stretches for back to help reduce pain/soreness from recent fall; progress general low back and LE ROM/strengthening exercises as tolerated - review/update HEP as indicated; static and dynamic balance training; re-assess remaining LTGs - ABC scale and BERG test    Artist Pais, PTA 10/31/2022, 4:22 PM

## 2022-11-04 ENCOUNTER — Ambulatory Visit: Payer: Medicare Other

## 2022-11-07 ENCOUNTER — Ambulatory Visit: Payer: Medicare Other

## 2022-11-11 ENCOUNTER — Ambulatory Visit: Payer: Medicare Other

## 2022-11-11 DIAGNOSIS — R2681 Unsteadiness on feet: Secondary | ICD-10-CM

## 2022-11-11 DIAGNOSIS — R2689 Other abnormalities of gait and mobility: Secondary | ICD-10-CM

## 2022-11-11 DIAGNOSIS — M79605 Pain in left leg: Secondary | ICD-10-CM

## 2022-11-11 DIAGNOSIS — M6281 Muscle weakness (generalized): Secondary | ICD-10-CM

## 2022-11-11 NOTE — Therapy (Signed)
OUTPATIENT PHYSICAL THERAPY TREATMENT     Patient Name: Mary Ferguson MRN: [id number] DOB:Jan 22, 1951, 72 y.o., female Today's Date: 11/11/2022  END OF SESSION:  PT End of Session - 11/11/22 1552     Visit Number 8    Date for PT Re-Evaluation 11/20/22    Authorization Type Medicare and BCBS Supplement    PT Start Time 1534    PT Stop Time 1614    PT Time Calculation (min) 40 min    Activity Tolerance Patient tolerated treatment well    Behavior During Therapy [facility] for tasks assessed/performed                  Past Medical History:  Diagnosis Date   Acute and chronic respiratory failure with hypoxia ([place]) 01/24/2018   Acute diverticulitis 07/20/2022   Acute kidney injury superimposed on chronic kidney disease ([place]) 07/21/2022   Asthma    Blood in stool    Chicken pox    Chronic generalized abdominal pain    Chronic pain    CKD (chronic kidney disease), stage III (HCC)    Colon polyps    COPD (chronic obstructive pulmonary disease) ([place])    COVID-19 2020   Depression    Diverticulitis    Very severe.  Has had 2 bowel resections and temporary colostomy in the past.   Fall at home, initial encounter 07/21/2022   Fecal incontinence    Frequent headaches    GERD (gastroesophageal reflux disease)    Heart attack ([place])    Heart disease    High anion gap metabolic acidosis [id number]   History of blood transfusion    Hyperlipemia    Hypertension    Incontinence of feces 12/11/2016   Morbid obesity ([place])    Pneumonia due to infectious organism 01/12/2018   Postmenopausal 03/21/2021   PTSD (post-traumatic stress disorder)    Recurrent deep vein thrombosis (DVT) (HCC)    x6   Rhabdomyolysis 07/21/2022   Urinary incontinence    Past Surgical History:  Procedure Laterality Date   ABDOMINAL HYSTERECTOMY  1995   APPENDECTOMY     CATARACT EXTRACTION Bilateral    CESAREAN SECTION  1984   CHOLECYSTECTOMY  2016   COLOSTOMY     and reversal   CORONARY STENT  INTERVENTION N/A 06/18/2019   Procedure: CORONARY STENT INTERVENTION;  Surgeon: Wellington Hampshire, MD;  Location: [place] [facility];  Service: Cardiovascular;  Laterality: N/A;   [place] and 2016 (2016 ventral w/ incarceration) 33 surgeries   HYSTEROSCOPY WITH D & C     13   LAPAROSCOPIC LYSIS OF ADHESIONS     x2   LEFT HEART CATH AND CORONARY ANGIOGRAPHY N/A 06/18/2019   Procedure: LEFT HEART CATH AND CORONARY ANGIOGRAPHY;  Surgeon: Wellington Hampshire, MD;  Location: [place] [facility];  Service: Cardiovascular;  Laterality: N/A;   LEFT HEART CATH AND CORONARY ANGIOGRAPHY N/A 11/30/2019   Procedure: LEFT HEART CATH AND CORONARY ANGIOGRAPHY;  Surgeon: Troy Sine, MD;  Location: [place] [facility];  Service: Cardiovascular;  Laterality: N/A;   NASAL RECONSTRUCTION  02-24-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   TONSILLECTOMY  1959   VEIN SURGERY     laser x2   Patient Active Problem List   Diagnosis Date Noted   Elevated liver enzymes 07/21/2022   Chronic venous insufficiency 01/08/2022   Irritable bowel syndrome with diarrhea 06/21/2021   Imdur therapy-Coronary artery disease  with angina pectoris ([place]) 03/22/2021   Compression fracture of L1 lumbar vertebra ([place]) 03/22/2021   Lumbar radiculopathy 03/22/2021   Neuropathy 03/22/2021   Paresthesia of bilateral legs 03/22/2021   Aortic atherosclerosis ([place]) 03/22/2021   Type II diabetes mellitus with manifestations ([place]) 03/21/2021   COPD (chronic obstructive pulmonary disease) ([place]) 03/21/2021   CAD S/P PCI 06/30/2019   Dyslipidemia, goal LDL below 70 06/30/2019   Morbid obesity ([place])    Essential hypertension    History of non-ST elevation myocardial infarction (NSTEMI) 06/17/2019   Acute on chronic respiratory failure with hypoxemia ([place]) 01/24/2018   Mild episode of recurrent major depressive disorder ([place]) 03/25/2017   CKD (chronic kidney disease) stage 3, GFR 30-59 ml/min (HCC) 03/19/2017    Vitamin D deficiency 03/19/2017   B12 deficiency 03/19/2017   Nocturnal oxygen desaturation 12/11/2016   BMI 45.0-49.9, adult ([place]) 12/11/2016   Ambulates with cane 12/11/2016   Chronic pruritic rash in adult 07/08/2016   Chronic pain syndrome 03/13/2015   Abdominal wall hernia 01/27/2014   Obstructive sleep apnea (adult) (pediatric) 08/05/2013   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[place]) 02/18/2013   Moderate persistent asthma 12/30/2012   History of recurrent deep vein thrombosis (DVT) 12/30/2012    PCP: Ma Hillock, DO  REFERRING PROVIDER: Debbe Odea, MD  REFERRING DIAG: [phone] (ICD-10-CM) - Rhabdomyolysis   THERAPY DIAG:  Unsteadiness on feet  Other abnormalities of gait and mobility  Muscle weakness (generalized)  Pain in both lower extremities  RATIONALE FOR EVALUATION AND TREATMENT: Rehabilitation  ONSET DATE: July 18, 2022  NEXT MD VISIT: 09/09/22 with PCP   SUBJECTIVE:   SUBJECTIVE STATEMENT: Pt reports having another fall last week in the bathroom, she does not remember how it happened but she fell on her L shoulder and has some bruising on her chest.  PAIN:  Are you having pain? Yes: NPRS scale:  8/10 Pain location: Low back and legs Pain description: stabbing, stinging Aggravating factors: everything Relieving factors: ibuprofen or Tylenol as needed   PERTINENT HISTORY: Asthma, Stage III CKD, COPD, Depression, GERD, Heart Disease (Coronary Stent, Ablation, Heart Cath), Lumbar radiculopathy, Obesity, Type II DM, Recurrent DVT   PRECAUTIONS: Fall  WEIGHT BEARING RESTRICTIONS: No  FALLS:  Has patient fallen in last 6 months? Yes. Number of falls 4-6  LIVING ENVIRONMENT: Lives with: lives with their son and daughter-in-law Lives in: House/apartment Stairs: Yes: External: uses a ramp that was built steps; none Has following equipment at home: Single point cane, Environmental consultant - 4 wheeled, Electronics engineer, Grab bars, and Ramped entry  OCCUPATION: does  not work  PLOF: Needs assistance with ADLs and Needs assistance with transfers  PATIENT GOALS: "improve balance and stability"   OBJECTIVE:   DIAGNOSTIC FINDINGS:  07/22/22 MR Thoracic Spine w/wo Contrast IMPRESSION: 1. No acute findings or explanation for the patient's symptoms. 2. Chronic superior endplate compression deformity at L1. No acute osseous findings. 3. Mild thoracic spondylosis without cord deformity or high-grade spinal stenosis. 4. 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 5. Mild disc bulging and facet hypertrophy at L4-5 contributing to mild spinal stenosis with mild lateral recess and foraminal narrowing bilaterally.  07/22/22 MR Lumbar Spine w/wo Contrast IMPRESSION: 1. No acute findings or explanation for the patient's symptoms. 2. Chronic superior endplate compression deformity at L1. No acute osseous findings. 3. Mild thoracic spondylosis without cord deformity or high-grade spinal stenosis. 4. Chronic bilateral L5 pars defects with resulting grade 1  anterolisthesis and mild foraminal narrowing bilaterally at L5-S1. 5. Mild disc bulging and facet hypertrophy at L4-5 contributing to mild spinal stenosis with mild lateral recess and foraminal narrowing bilaterally.  PATIENT SURVEYS:  ABC scale 60 / 1600 = 3.8 %  COGNITION: Overall cognitive status: Impaired and increased response time and delay with answer questions, occasional stuttering      SENSATION: Light touch: Impaired  Proprioception: Impaired   EDEMA:  NT  MUSCLE LENGTH: NT  POSTURE: rounded shoulders, forward head, and increased thoracic kyphosis  PALPATION: NT  LOWER EXTREMITY ROM:  Grossly limited by general observation  LOWER EXTREMITY MMT: MMT increased back pain  MMT Right eval Left eval Right 10/28/22 Left 10/28/22  Hip flexion 3 2+ 3+ 4-  Hip extension   4- * 4- *  Hip abduction 4-* 4-* 4 * 4 *   Hip adduction 4-* 4-* 4 * 4 *  Hip internal rotation 3+ 3+ 4- 3+  Hip external rotation 3+ 3+ 3+ 3+  Knee flexion 4- 4- 4 4-  Knee extension 4- 4- 4 4  Ankle dorsiflexion 4- 4- 4 4  Ankle plantarflexion      Ankle inversion      Ankle eversion       (Blank rows = not tested, * = modified to sitting position for testing)  LOWER EXTREMITY SPECIAL TESTS:  NT  FUNCTIONAL TESTS:  5 times sit to stand: 34.69 sec with B UE assist (eval); 30 sec B UE assist (09/25/22) 10 meter walk test: 76.25 sec with rollator Berg Balance Scale: 21/56, < 36 high risk for falls (close to 100%)   (08/29/22) Gait speed: 0.43 ft/sec with rollator  10/28/22: 10MWT: 25.87 sec with rollator; 28.94 sec with SPC Gait speed: 1.27 ft/sec with rollator; 1.13 ft/sec with SPC 5xSTS: 44.03 sec w/ hand from thighs  GAIT: Distance walked: 90 Assistive device utilized: Environmental consultant - 4 wheeled Level of assistance: Modified independence Comments: decreased BLE, significantly decreased gait speed, decreased stride/step length BLE, forward flexed posture   TODAY'S TREATMENT:                                                                                                                              DATE:  11/11/22 THERAPEUTIC EXERCISE: to improve flexibility, strength and mobility.  Verbal and tactile cues throughout for technique.  Gait for 180 ft 2 trials with rollator, pt became dizzy with certain turns and had mild SOB towards end of last tiral  Seated marches x 20 bil Standing heel raise x 10 bil Standing marches x 10 bil Standing hip extension x 10 bil Seated chest press with yellow weight ball x 10  Seated ball squeeze x 10  10/31/22 THERAPEUTIC EXERCISE: to improve flexibility, strength and mobility.  Verbal and tactile cues throughout for technique.  NuStep - L4 x 8 min Seated TrA isometric 10x Seated lumbar flexion stretch 2x30 sec with green pball  Seated LAQ x 10  bil 2# Seated march x 5 bil  Seated HS curl RTB  x 10 bil Standing heel raise x 10 bil with rollator Seated chest press with yellow weight ballx 10  Gait 140 ft rollator SBA  10/28/22 THERAPEUTIC EXERCISE: to improve flexibility, strength and mobility.  Verbal and tactile cues throughout for technique.  NuStep - L4 x 7 min Seated TrA isometric 10 x 5" Seated TrA + alt march with RTB folded around and over legs x 10 Seated TrA + alt hip ABD/ER with RTB folded around and over legs x 10 - pt reporting discomfort in R groin but resolved following R hip adductor stretch R hip adductor stretch in sitting 1 x 30"  THERAPEUTIC ACTIVITIES:  LE MMT 10MWT: 25.87 sec with rollator; 28.94 sec with SPC Gait speed: 1.27 ft/sec with rollator; 1.13 ft/sec with SPC 5xSTS: 44.03 sec w/ hand from thighs    09/25/22 THERAPEUTIC EXERCISE: to improve flexibility, strength and mobility.  Verbal and tactile cues throughout for technique.  Nustep L4x51min Seated hip ADD ball squeeze 2x10 5xSTS- 30 sec Assessed ability to transfer and gait 120' with 4WW- cues to increase heel strike    09/12/22 THERAPEUTIC EXERCISE: to improve flexibility, strength and mobility.  Verbal and tactile cues throughout for technique.  Gait 320 ft RW - cue for posture but pt unable d/t pain - O2 sats: 88% after first trial, 97% after second Seated hip ADD ball squeeze 10x5" Seated TrA activation 10x5" Seated LAQ x 10 bil Seated march x 10 bil Seated hip ABD RTB x 5 bil   PATIENT EDUCATION:  Education details: HEP update - RTB added to seated march and clams, transfer safety, and gait safety with rollator   Person educated: Patient Education method: Explanation, Demonstration, and Verbal cues Education comprehension: verbalized understanding, returned demonstration, verbal cues required, and needs further education  HOME EXERCISE PROGRAM: Access Code: [email or web address] URL: [email or web address] Date: 09/05/2022 Prepared by: Annie Paras  Exercises - Seated  Transversus Abdominis Bracing  - 1 x daily - 7 x weekly - 2 sets - 5 reps - 5 sec hold - Seated March  - 1 x daily - 7 x weekly - 2 sets - 5 reps - 3 sec hold - Seated Single Leg Hip Abduction  - 1 x daily - 7 x weekly - 2 sets - 5 reps - 3 sec hold  ASSESSMENT:  CLINICAL IMPRESSION: Progressed exercises working on proximal LE strengthening, core strengthening, and cardiopulmonary endurance. Pt has had another fall since last visit but reportedly couldn't recall why it happened. She admits to poor HEP compliance but she was encouraged to do any exercise over none. She still requires rollator when walking so she is unable to wean to a LRAD at this time (LTG #5).  OBJECTIVE IMPAIRMENTS: Abnormal gait, cardiopulmonary status limiting activity, decreased activity tolerance, decreased balance, decreased cognition, decreased coordination, decreased endurance, decreased knowledge of condition, decreased knowledge of use of DME, decreased mobility, difficulty walking, decreased ROM, decreased strength, decreased safety awareness, dizziness, hypomobility, increased edema, increased fascial restrictions, impaired perceived functional ability, impaired flexibility, impaired sensation, impaired tone, postural dysfunction, and pain.   ACTIVITY LIMITATIONS: carrying, lifting, bending, sitting, standing, squatting, stairs, transfers, bed mobility, bathing, toileting, dressing, and locomotion level  PARTICIPATION LIMITATIONS: meal prep, cleaning, laundry, medication management, shopping, and community activity  PERSONAL FACTORS: Age, Education, Past/current experiences, Time since onset of injury/illness/exacerbation, and 3+ comorbidities: Asthma, Stage III CKD, COPD, Depression, GERD, Heart Disease (Coronary Stent, Ablation, Heart Cath),  Lumbar radiculopathy, Obesity, Type II DM, Recurrent DVT  are also affecting patient's functional outcome.   REHAB POTENTIAL: Good  CLINICAL DECISION MAKING: Evolving/moderate  complexity  EVALUATION COMPLEXITY: Moderate   GOALS: Goals reviewed with patient? Yes  SHORT TERM GOALS: Target date: 08/29/22, extended to 09/13/2022 due prolonged absence from PT   Pt will be independent with initial HEP.  Baseline: Goal status: MET- 09/12/22  2.  Pt will demonstrate safe techniques during transfers and gait using the rollator without needed cueing.  Baseline:  Goal status: MET - 09/25/22   LONG TERM GOALS: Target date: 09/26/22 extended to 11/20/22  Pt will be independent with advanced/ongoing HEP to improve outcomes/carryover.  Baseline:  Goal status: IN PROGRESS  2.  Pt will report at least 22% balance confidence on ABC scale to demonstrate improved confidence with functional mobility.   Baseline: 60 / 1600 = 3.8 % (MDIC 19%) Goal status: IN PROGRESS  3.  Pt will improve Berg Balance Score by 8 points to decrease risk of falls.  Baseline: 21/56 Goal status: IN PROGRESS  4.  Pt will demonstrate consistent safe ambulation and transfers techniques using Rollator to improve safety around the home and in the community.  Baseline:  Goal status: IN PROGRESS  10/28/22 - cues required for proper use of rollator brakes and positioning relative to seat for transfers  5.  Pt will amb >300 feet with normal gait pattern and LRAD to complete daily tasks around the home with increased endurance.  Baseline:  Goal status: IN PROGRESS  11/11/22- 360' but needs rollator, rest required after 180 ft  6.  Pt will improve 5x STS time to </= 20 seconds to demonstrate improved functional strength and transfer efficiency.  Baseline: 34.69 sec with B UE assist; 30 sec with B UE assist (09/25/22) Goal status: IN PROGRESS  10/28/22 - 44.03 sec with B hands on thighs  7.  Pt will improve gait speed to >1.3 ft/sec to improve household ambulation with decreased fall risk.  Baseline: 0.43 ft/sec with rollator Goal status: IN PROGRESS  10/28/22 - 1.27 ft/sec with rollator; 1.13 ft/sec with SPC  8.   Pt will report 50% improvement in low back and LE pain to improve QOL.  Baseline:  Goal status: IN PROGRESS  10/28/22 - increased pain as a result of recent fall    PLAN:  PT FREQUENCY: 2x/week  PT DURATION: 8 weeks  PLANNED INTERVENTIONS: Therapeutic exercises, Therapeutic activity, Neuromuscular re-education, Balance training, Gait training, Patient/Family education, Self Care, Joint mobilization, DME instructions, Aquatic Therapy, Dry Needling, Electrical stimulation, Cryotherapy, Moist heat, Compression bandaging, Vasopneumatic device, Ultrasound, Ionotophoresis 4mg /ml Dexamethasone, Manual therapy, and Re-evaluation  PLAN FOR NEXT SESSION: check BERG and ABC scale; vital sign check as indicated (supposed to be on home O2 but does not come to PT on O2); handout on safety in the home/DME; introduce stretches for back to help reduce pain/soreness from recent fall; progress general low back and LE ROM/strengthening exercises as tolerated - review/update HEP as indicated; static and dynamic balance training   Artist Pais, PTA 11/11/2022, 4:18 PM

## 2022-11-14 ENCOUNTER — Ambulatory Visit: Payer: Medicare Other | Admitting: Physical Therapy

## 2022-11-14 ENCOUNTER — Encounter: Payer: Self-pay | Admitting: Physical Therapy

## 2022-11-14 DIAGNOSIS — M79604 Pain in right leg: Secondary | ICD-10-CM

## 2022-11-14 DIAGNOSIS — R2689 Other abnormalities of gait and mobility: Secondary | ICD-10-CM

## 2022-11-14 DIAGNOSIS — M6281 Muscle weakness (generalized): Secondary | ICD-10-CM

## 2022-11-14 DIAGNOSIS — R2681 Unsteadiness on feet: Secondary | ICD-10-CM | POA: Diagnosis not present

## 2022-11-14 NOTE — Therapy (Addendum)
OUTPATIENT PHYSICAL THERAPY TREATMENT / RE-CERTIFICATION / DISCHARGE SUMMARY  Progress Note  Reporting Period 09/25/22 to 11/14/22  See note below for Objective Data and Assessment of Progress/Goals.      Patient Name: Mary Ferguson MRN: [id number] DOB:[birth year], 72 y.o., female Today's Date: 11/14/2022  END OF SESSION:  PT End of Session - 11/14/22 1531     Visit Number 9    Date for PT Re-Evaluation 01/09/23    Authorization Type Medicare and BCBS Supplement    Progress Note Due on Visit 19   Recert on visit #9 - 11/14/22   PT Start Time 1531    PT Stop Time 1620    PT Time Calculation (min) 49 min    Activity Tolerance Patient tolerated treatment well;Patient limited by fatigue;Patient limited by pain    Behavior During Therapy [facility] for tasks assessed/performed                  Past Medical History:  Diagnosis Date   Acute and chronic respiratory failure with hypoxia (HCC) 01/24/2018   Acute diverticulitis 07/20/2022   Acute kidney injury superimposed on chronic kidney disease (HCC) 07/21/2022   Asthma    Blood in stool    Chicken pox    Chronic generalized abdominal pain    Chronic pain    CKD (chronic kidney disease), stage III (HCC)    Colon polyps    COPD (chronic obstructive pulmonary disease) (HCC)    COVID-19 2020   Depression    Diverticulitis    Very severe.  Has had 2 bowel resections and temporary colostomy in the past.   Fall at home, initial encounter 07/21/2022   Fecal incontinence    Frequent headaches    GERD (gastroesophageal reflux disease)    Heart attack (HCC)    Heart disease    High anion gap metabolic acidosis 07/21/2022   History of blood transfusion    Hyperlipemia    Hypertension    Incontinence of feces 12/11/2016   Morbid obesity (HCC)    Pneumonia due to infectious organism 01/12/2018   Postmenopausal 03/21/2021   PTSD (post-traumatic stress disorder)    Recurrent deep vein thrombosis (DVT) (HCC)    x6   Rhabdomyolysis  07/21/2022   Urinary incontinence    Past Surgical History:  Procedure Laterality Date   ABDOMINAL HYSTERECTOMY  1995   APPENDECTOMY     CATARACT EXTRACTION Bilateral    CESAREAN SECTION  1984   CHOLECYSTECTOMY  2016   COLOSTOMY     and reversal   CORONARY STENT INTERVENTION N/A 06/18/2019   Procedure: CORONARY STENT INTERVENTION;  Surgeon: Iran Ouch, MD;  Location: [facility] INVASIVE CV [facility];  Service: Cardiovascular;  Laterality: N/A;   ENDOMETRIAL ABLATION     HERNIA REPAIR  1985   1985 and 2016 (2016 ventral w/ incarceration) 33 surgeries   HYSTEROSCOPY WITH D & C     13   LAPAROSCOPIC LYSIS OF ADHESIONS     x2   LEFT HEART CATH AND CORONARY ANGIOGRAPHY N/A 06/18/2019   Procedure: LEFT HEART CATH AND CORONARY ANGIOGRAPHY;  Surgeon: Iran Ouch, MD;  Location: [facility] INVASIVE CV LAB;  Service: Cardiovascular;  Laterality: N/A;   LEFT HEART CATH AND CORONARY ANGIOGRAPHY N/A 11/30/2019   Procedure: LEFT HEART CATH AND CORONARY ANGIOGRAPHY;  Surgeon: Lennette Bihari, MD;  Location: [facility] INVASIVE CV LAB;  Service: Cardiovascular;  Laterality: N/A;   NASAL RECONSTRUCTION  January 31, 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   TONSILLECTOMY  1959   VEIN SURGERY     laser x2   Patient Active Problem List   Diagnosis Date Noted   Elevated liver enzymes 07/21/2022   Chronic venous insufficiency 01/08/2022   Irritable bowel syndrome with diarrhea 06/21/2021   Imdur therapy-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) 03/22/2021   Compression fracture of L1 lumbar vertebra (HCC) 03/22/2021   Lumbar radiculopathy 03/22/2021   Neuropathy 03/22/2021   Paresthesia of bilateral legs 03/22/2021   Aortic atherosclerosis (HCC) 03/22/2021   Type II diabetes mellitus with manifestations (HCC) 03/21/2021   COPD (chronic obstructive pulmonary disease) (HCC) 03/21/2021   CAD S/P PCI 06/30/2019   Dyslipidemia, goal LDL below 70 06/30/2019   Morbid obesity (HCC)    Essential hypertension    History of non-ST  elevation myocardial infarction (NSTEMI) 06/17/2019   Acute on chronic respiratory failure with hypoxemia (HCC) 01/24/2018   Mild episode of recurrent major depressive disorder (HCC) 03/25/2017   CKD (chronic kidney disease) stage 3, GFR 30-59 ml/min (HCC) 03/19/2017   Vitamin D deficiency 03/19/2017   B12 deficiency 03/19/2017   Nocturnal oxygen desaturation 12/11/2016   BMI 45.0-49.9, adult (HCC) 12/11/2016   Ambulates with cane 12/11/2016   Chronic pruritic rash in adult 07/08/2016   Chronic pain syndrome 03/13/2015   Abdominal wall hernia 01/27/2014   Obstructive sleep apnea (adult) (pediatric) 08/05/2013   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HCC) 02/18/2013   Moderate persistent asthma 12/30/2012   History of recurrent deep vein thrombosis (DVT) 12/30/2012    PCP: Natalia Leatherwood, DO  REFERRING PROVIDER: Calvert Cantor, MD  REFERRING DIAG: [phone] (ICD-10-CM) - Rhabdomyolysis   THERAPY DIAG:  Unsteadiness on feet  Other abnormalities of gait and mobility  Muscle weakness (generalized)  Pain in both lower extremities  RATIONALE FOR EVALUATION AND TREATMENT: Rehabilitation  ONSET DATE: July 18, 2022  NEXT MD VISIT: 01/01/23 with PCP   SUBJECTIVE:   SUBJECTIVE STATEMENT: Pt denies any new falls since last visit.  PAIN:  Are you having pain? Yes: NPRS scale:  7/10 Pain location: Low back and B lateral upper legs Pain description: stabbing, stinging Aggravating factors: everything Relieving factors: ibuprofen or Tylenol as needed   PERTINENT HISTORY: Asthma, Stage III CKD, COPD, Depression, GERD, Heart Disease (Coronary Stent, Ablation, Heart Cath), Lumbar radiculopathy, Obesity, Type II DM, Recurrent DVT   PRECAUTIONS: Fall  WEIGHT BEARING RESTRICTIONS: No  FALLS:  Has patient fallen in last 6 months? Yes. Number of falls 4-6  LIVING ENVIRONMENT: Lives with: lives with their son and daughter-in-law Lives in: House/apartment Stairs: Yes: External: uses a  ramp that was built steps; none Has following equipment at home: Single point cane, Environmental consultant - 4 wheeled, Tour manager, Grab bars, and Ramped entry  OCCUPATION: does not work  PLOF: Needs assistance with ADLs and Needs assistance with transfers  PATIENT GOALS: "improve balance and stability"   OBJECTIVE:   DIAGNOSTIC FINDINGS:  07/22/22 MR Thoracic Spine w/wo Contrast IMPRESSION: 1. No acute findings or explanation for the patient's symptoms. 2. Chronic superior endplate compression deformity at L1. No acute osseous findings. 3. Mild thoracic spondylosis without cord deformity or high-grade spinal stenosis. 4. 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 5. Mild disc bulging and facet hypertrophy at L4-5 contributing to mild spinal stenosis with mild lateral recess and foraminal narrowing bilaterally.  07/22/22 MR Lumbar Spine w/wo Contrast IMPRESSION: 1. No acute findings or explanation for the patient's symptoms. 2. Chronic superior endplate compression  deformity at L1. No acute osseous findings. 3. Mild thoracic spondylosis without cord deformity or high-grade spinal stenosis. 4. 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 5. Mild disc bulging and facet hypertrophy at L4-5 contributing to mild spinal stenosis with mild lateral recess and foraminal narrowing bilaterally.  PATIENT SURVEYS:  ABC scale 60 / 1600 = 3.8 % 11/14/22: 470 / 1600 = 29.4 %  COGNITION: Overall cognitive status: Impaired and increased response time and delay with answer questions, occasional stuttering      SENSATION: Light touch: Impaired  Proprioception: Impaired   EDEMA:  NT  MUSCLE LENGTH: NT  POSTURE: rounded shoulders, forward head, and increased thoracic kyphosis  PALPATION: NT  LOWER EXTREMITY ROM:  Grossly limited by general  observation  LOWER EXTREMITY MMT: MMT increased back pain  MMT Right eval Left eval Right 10/28/22 Left 10/28/22  Hip flexion 3 2+ 3+ 4-  Hip extension   4- * 4- *  Hip abduction 4-* 4-* 4 * 4 *  Hip adduction 4-* 4-* 4 * 4 *  Hip internal rotation 3+ 3+ 4- 3+  Hip external rotation 3+ 3+ 3+ 3+  Knee flexion 4- 4- 4 4-  Knee extension 4- 4- 4 4  Ankle dorsiflexion 4- 4- 4 4  Ankle plantarflexion      Ankle inversion      Ankle eversion       (Blank rows = not tested, * = modified to sitting position for testing)  LOWER EXTREMITY SPECIAL TESTS:  NT  FUNCTIONAL TESTS:  5 times sit to stand: 34.69 sec with B UE assist (eval); 30 sec B UE assist (09/25/22) 10 meter walk test: 76.25 sec with rollator Berg Balance Scale: 21/56, < 36 high risk for falls (close to 100%)   (08/29/22) Gait speed: 0.43 ft/sec with rollator  10/28/22: : 25.87 sec with rollator; 28.94 sec with SPC Gait speed: 1.27 ft/sec with rollator; 1.13 ft/sec with SPC 5xSTS: 44.03 sec w/ hand from thighs  11/14/22: Sharlene Motts: 32/56  GAIT: Distance walked: 90 Assistive device utilized: Environmental consultant - 4 wheeled Level of assistance: Modified independence Comments: decreased BLE, significantly decreased gait speed, decreased stride/step length BLE, forward flexed posture   TODAY'S TREATMENT:                                                                                                                              DATE:   11/14/22 THERAPEUTIC EXERCISE: to improve flexibility, strength and mobility.  Verbal and tactile cues throughout for technique.  NuStep - L5 x 6 min  THERAPEUTIC ACTIVITIES: Berg: 32/56 ABC Scale: 470 / 1600 = 29.4 % Goal assessment   11/11/22 THERAPEUTIC EXERCISE: to improve flexibility, strength and mobility.  Verbal and tactile cues throughout for technique.  Gait for 180 ft 2 trials with rollator, pt became dizzy with certain turns and had mild SOB towards end of last tiral  Seated marches x 20  bil Standing heel raise x 10 bil Standing marches x 10 bil Standing hip extension x 10 bil Seated chest press with yellow weight ball x 10  Seated ball squeeze x 10   10/31/22 THERAPEUTIC EXERCISE: to improve flexibility, strength and mobility.  Verbal and tactile cues throughout for technique.  NuStep - L4 x 8 min Seated TrA isometric 10x Seated lumbar flexion stretch 2x30 sec with green pball  Seated LAQ x 10 bil 2# Seated march x 5 bil  Seated HS curl RTB x 10 bil Standing heel raise x 10 bil with rollator Seated chest press with yellow weight ballx 10  Gait 140 ft rollator SBA   PATIENT EDUCATION:  Education details: progress with PT, ongoing PT POC, transfer safety, and gait safety with rollator   Person educated: Patient Education method: Explanation, Demonstration, and Verbal cues Education comprehension: verbalized understanding, returned demonstration, verbal cues required, and needs further education  HOME EXERCISE PROGRAM: Access Code: [email or web address] URL: [email or web address] Date: 09/05/2022 Prepared by: Glenetta Hew  Exercises - Seated Transversus Abdominis Bracing  - 1 x daily - 7 x weekly - 2 sets - 5 reps - 5 sec hold - Seated March  - 1 x daily - 7 x weekly - 2 sets - 5 reps - 3 sec hold - Seated Single Leg Hip Abduction  - 1 x daily - 7 x weekly - 2 sets - 5 reps - 3 sec hold  ASSESSMENT:  CLINICAL IMPRESSION: Mary Ferguson continues to experience recurrent falls but is demonstrating improving overall LE strength and balance per standardized testing, although still at a high fall risk level.  Her balance confidence per ABC scale has improved from 3.8% to 29.4%.  She continues to require cues for safety with use of rollator during transfers and gait. Gait speed significantly improved although still at risk for recurrent falls.  She is demonstrating progress towards her goal and remains motivated to improve her functional mobility and gait, therefore will  recommend recert for additional 2x/wk for up to 8 weeks as long as continued progress observed.  OBJECTIVE IMPAIRMENTS: Abnormal gait, cardiopulmonary status limiting activity, decreased activity tolerance, decreased balance, decreased cognition, decreased coordination, decreased endurance, decreased knowledge of condition, decreased knowledge of use of DME, decreased mobility, difficulty walking, decreased ROM, decreased strength, decreased safety awareness, dizziness, hypomobility, increased edema, increased fascial restrictions, impaired perceived functional ability, impaired flexibility, impaired sensation, impaired tone, postural dysfunction, and pain.   ACTIVITY LIMITATIONS: carrying, lifting, bending, sitting, standing, squatting, stairs, transfers, bed mobility, bathing, toileting, dressing, and locomotion level  PARTICIPATION LIMITATIONS: meal prep, cleaning, laundry, medication management, shopping, and community activity  PERSONAL FACTORS: Age, Education, Past/current experiences, Time since onset of injury/illness/exacerbation, and 3+ comorbidities: Asthma, Stage III CKD, COPD, Depression, GERD, Heart Disease (Coronary Stent, Ablation, Heart Cath), Lumbar radiculopathy, Obesity, Type II DM, Recurrent DVT  are also affecting patient's functional outcome.   REHAB POTENTIAL: Good  CLINICAL DECISION MAKING: Evolving/moderate complexity  EVALUATION COMPLEXITY: Moderate   GOALS: Goals reviewed with patient? Yes  SHORT TERM GOALS: Target date: 08/29/22, extended to 09/13/2022 due prolonged absence from PT   Pt will be independent with initial HEP.  Baseline: Goal status: MET  09/12/22  2.  Pt will demonstrate safe techniques during transfers and gait using the rollator without needed cueing.  Baseline:  Goal status: MET  09/25/22   LONG TERM GOALS: Target date: 09/26/22, extended to 11/20/22, extended to 01/09/2023   Pt will be independent with  advanced/ongoing HEP to improve  outcomes/carryover.  Baseline:  Goal status: IN PROGRESS    2.  Pt will report at least 22% balance confidence on ABC scale to demonstrate improved confidence with functional mobility.   Baseline: 60 / 1600 = 3.8 % (MDIC 19%) Goal status: MET and REVISED (see 2a below)  11/14/22 - 470 / 1600 = 29.4 %  2a.  Pt will report at least 22% balance confidence on ABC scale to demonstrate improved confidence with functional mobility.   Baseline: 470 / 1600 = 29.4 % (11/14/22) Goal status: REVISED   3.  Pt will improve Berg Balance Score by 8 points to decrease risk of falls.  Baseline: 21/56 Goal status: MET and REVISED (see 3a below)  11/14/22 - Berg = 32/56  3a.  Patient will improve Berg score to >/= 40/56 to improve safety and stability with ADLs in standing and reduce risk for falls Baseline:  Berg = 32/56 Goal status: REVISED    4.  Pt will demonstrate consistent safe ambulation and transfers techniques using Rollator to improve safety around the home and in the community.  Baseline:  Goal status: IN PROGRESS   10/28/22 - cues required for proper use of rollator brakes and positioning relative to seat for transfers  5.  Pt will amb >300 feet with normal gait pattern and LRAD to complete daily tasks around the home with increased endurance.  Baseline:  Goal status: IN PROGRESS  11/11/22 - 360' but needs rollator, rest required after 180 ft  6.  Pt will improve 5x STS time to </= 20 seconds to demonstrate improved functional strength and transfer efficiency.  Baseline: 34.69 sec with B UE assist; 30 sec with B UE assist (09/25/22) Goal status: IN PROGRESS  10/28/22 - 44.03 sec with B hands on thighs  7.  Pt will improve gait speed to >1.3 ft/sec to improve household ambulation with decreased fall risk.  Baseline: 0.43 ft/sec with rollator Goal status: IN PROGRESS  10/28/22 - 1.27 ft/sec with rollator; 1.13 ft/sec with SPC  8.  Pt will report 50% improvement in low back and LE pain to improve QOL.   Baseline:  Goal status: IN PROGRESS  11/14/22 - pt reports pain remains essentially unchanged    PLAN:  PT FREQUENCY: 2x/week  PT DURATION: 8 weeks  PLANNED INTERVENTIONS: Therapeutic exercises, Therapeutic activity, Neuromuscular re-education, Balance training, Gait training, Patient/Family education, Self Care, Joint mobilization, DME instructions, Aquatic Therapy, Dry Needling, Electrical stimulation, Cryotherapy, Moist heat, Compression bandaging, Vasopneumatic device, Ultrasound, Ionotophoresis 4mg /ml Dexamethasone, Manual therapy, and Re-evaluation  PLAN FOR NEXT SESSION: vital sign check as indicated (supposed to be on home O2 but does not come to PT on O2); handout on safety in the home/DME; introduce stretches for back to help reduce pain/soreness from recent falls; progress general low back and LE ROM/strengthening exercises as tolerated - review/update HEP as indicated; static and dynamic balance training   Marry Guan, PT 11/14/2022, 5:44 PM   PHYSICAL THERAPY DISCHARGE SUMMARY  Visits from Start of Care: 9  Current functional level related to goals / functional outcomes: Refer to above clinical impression and goal assessment for status as of recert visit on 11/14/2022. Patient cancelled all remaining visits and as of 12/05/22 called stating that she could not do PT anymore, therefore will proceed with discharge from PT for this episode.     Remaining deficits: As above. Unable to formally assess status at discharge due to failure to return to PT.  Education / Equipment: HEP   Patient agrees to discharge. Patient goals were partially met. Patient is being discharged due to not returning since the last visit.  Marry Guan, PT 02/20/23, 5:39 PM  [facility] [address]  [address] [place], [place], [address] Phone: [phone]   Fax:  [phone]

## 2022-11-18 ENCOUNTER — Ambulatory Visit: Payer: Medicare Other

## 2022-12-03 ENCOUNTER — Encounter (INDEPENDENT_AMBULATORY_CARE_PROVIDER_SITE_OTHER): Payer: Medicare Other | Admitting: Ophthalmology

## 2022-12-03 DIAGNOSIS — Z961 Presence of intraocular lens: Secondary | ICD-10-CM

## 2022-12-03 DIAGNOSIS — E119 Type 2 diabetes mellitus without complications: Secondary | ICD-10-CM

## 2022-12-03 DIAGNOSIS — I10 Essential (primary) hypertension: Secondary | ICD-10-CM

## 2022-12-03 DIAGNOSIS — H43813 Vitreous degeneration, bilateral: Secondary | ICD-10-CM

## 2022-12-03 DIAGNOSIS — H35033 Hypertensive retinopathy, bilateral: Secondary | ICD-10-CM

## 2022-12-05 ENCOUNTER — Ambulatory Visit: Payer: Medicare Other | Admitting: Physical Therapy

## 2022-12-09 ENCOUNTER — Encounter: Payer: Medicare Other | Admitting: Physical Therapy

## 2022-12-19 ENCOUNTER — Encounter: Payer: Medicare Other | Admitting: Physical Therapy

## 2022-12-19 ENCOUNTER — Other Ambulatory Visit: Payer: Self-pay | Admitting: Cardiovascular Disease

## 2022-12-19 ENCOUNTER — Other Ambulatory Visit: Payer: Self-pay | Admitting: Physician Assistant

## 2022-12-19 DIAGNOSIS — Z86718 Personal history of other venous thrombosis and embolism: Secondary | ICD-10-CM

## 2022-12-19 NOTE — Telephone Encounter (Signed)
Prescription refill request for Xarelto received.  Indication: DVT Last office visit: [place], 04/04/2022 Weight: 108.9 kg  Age: 72 Scr: 1.13, 09/18/2022 CrCl: 79 ml/min   Refill sent.

## 2022-12-22 ENCOUNTER — Other Ambulatory Visit: Payer: Self-pay | Admitting: Physician Assistant

## 2022-12-23 ENCOUNTER — Encounter: Payer: Medicare Other | Admitting: Physical Therapy

## 2022-12-26 ENCOUNTER — Encounter: Payer: Medicare Other | Admitting: Physical Therapy

## 2022-12-30 ENCOUNTER — Encounter: Payer: Medicare Other | Admitting: Physical Therapy

## 2023-01-01 ENCOUNTER — Ambulatory Visit: Payer: Medicare Other | Admitting: Family Medicine

## 2023-01-02 ENCOUNTER — Encounter: Payer: Medicare Other | Admitting: Physical Therapy

## 2023-01-06 ENCOUNTER — Encounter: Payer: Medicare Other | Admitting: Physical Therapy

## 2023-01-16 ENCOUNTER — Encounter: Payer: Medicare Other | Admitting: Physical Therapy

## 2023-04-11 ENCOUNTER — Other Ambulatory Visit: Payer: Self-pay

## 2023-04-11 ENCOUNTER — Other Ambulatory Visit: Payer: Self-pay | Admitting: *Deleted

## 2023-04-11 MED ORDER — METFORMIN HCL 500 MG PO TABS: 500.0000 mg | ORAL_TABLET | Freq: Two times a day (BID) | ORAL | 0 refills | Status: DC

## 2023-04-11 MED ORDER — AMITRIPTYLINE HCL 50 MG PO TABS: 50.0000 mg | ORAL_TABLET | Freq: Every day | ORAL | 0 refills | Status: DC

## 2023-04-11 MED ORDER — EMPAGLIFLOZIN 10 MG PO TABS: 10.0000 mg | ORAL_TABLET | Freq: Every day | ORAL | 0 refills | Status: DC

## 2023-04-11 MED ORDER — FAMOTIDINE 40 MG PO TABS: 40.0000 mg | ORAL_TABLET | Freq: Every day | ORAL | 0 refills | Status: DC

## 2023-04-11 MED ORDER — MAGNESIUM OXIDE -MG SUPPLEMENT 400 (240 MG) MG PO TABS: 400.0000 mg | ORAL_TABLET | Freq: Two times a day (BID) | ORAL | 0 refills | Status: DC

## 2023-04-11 MED ORDER — CARVEDILOL 6.25 MG PO TABS: ORAL_TABLET | ORAL | 0 refills | Status: DC

## 2023-04-15 NOTE — Patient Instructions (Signed)

## 2023-04-15 NOTE — Progress Notes (Signed)
   Canceled by office 30 day refill call in for pt

## 2023-04-16 ENCOUNTER — Encounter: Payer: Medicare Other | Admitting: Family Medicine

## 2023-04-16 DIAGNOSIS — I10 Essential (primary) hypertension: Secondary | ICD-10-CM

## 2023-04-16 DIAGNOSIS — E785 Hyperlipidemia, unspecified: Secondary | ICD-10-CM

## 2023-04-16 DIAGNOSIS — E118 Type 2 diabetes mellitus with unspecified complications: Secondary | ICD-10-CM

## 2023-04-16 DIAGNOSIS — Z23 Encounter for immunization: Secondary | ICD-10-CM

## 2023-04-16 DIAGNOSIS — N1831 Chronic kidney disease, stage 3a: Secondary | ICD-10-CM

## 2023-04-18 ENCOUNTER — Ambulatory Visit: Payer: Medicare Other | Admitting: Family Medicine

## 2023-04-21 MED ORDER — MAGNESIUM OXIDE -MG SUPPLEMENT 400 (240 MG) MG PO TABS: 400.0000 mg | ORAL_TABLET | Freq: Two times a day (BID) | ORAL | 0 refills | Status: DC

## 2023-04-21 MED ORDER — EMPAGLIFLOZIN 10 MG PO TABS: 10.0000 mg | ORAL_TABLET | Freq: Every day | ORAL | 0 refills | Status: DC

## 2023-04-21 MED ORDER — AMITRIPTYLINE HCL 50 MG PO TABS: 50.0000 mg | ORAL_TABLET | Freq: Every day | ORAL | 0 refills | Status: DC

## 2023-04-23 ENCOUNTER — Ambulatory Visit: Payer: Medicare Other | Admitting: Family Medicine

## 2023-05-06 ENCOUNTER — Telehealth: Payer: Self-pay

## 2023-05-06 ENCOUNTER — Ambulatory Visit: Payer: BLUE CROSS/BLUE SHIELD | Admitting: Family Medicine

## 2023-05-06 NOTE — Telephone Encounter (Signed)
Left VM for pt to confirm if she is coming to her appt, per provider request.

## 2023-05-06 NOTE — Telephone Encounter (Signed)
Per appt note:Patient wants to change appoitment to be treated for chest and nose congestion Need AWV completed same day / pt called at 11:26am, very weak, cant hold phone - i felt she needed to speak with triage, SOB, cough   Pt stated that she could not hold the phone to speak with triage. Called son to advise him to take pt to ED. Pt refused to go to ED although having SOB and weakness. Pt tested negative for COVID today and was again advised to go to ED.  Will follow up at the end of day.

## 2023-05-07 NOTE — Telephone Encounter (Signed)
Pt did not show for appt and no current documented ed visit

## 2023-05-13 ENCOUNTER — Encounter: Payer: Self-pay | Admitting: Family Medicine

## 2023-05-13 ENCOUNTER — Ambulatory Visit (INDEPENDENT_AMBULATORY_CARE_PROVIDER_SITE_OTHER): Payer: Medicare Other | Admitting: Family Medicine

## 2023-05-13 VITALS — BP 98/64 | HR 105 | Temp 97.9°F | Wt 232.8 lb

## 2023-05-13 DIAGNOSIS — Z86718 Personal history of other venous thrombosis and embolism: Secondary | ICD-10-CM

## 2023-05-13 DIAGNOSIS — N1831 Chronic kidney disease, stage 3a: Secondary | ICD-10-CM

## 2023-05-13 DIAGNOSIS — E785 Hyperlipidemia, unspecified: Secondary | ICD-10-CM

## 2023-05-13 DIAGNOSIS — I252 Old myocardial infarction: Secondary | ICD-10-CM | POA: Diagnosis not present

## 2023-05-13 DIAGNOSIS — Z23 Encounter for immunization: Secondary | ICD-10-CM

## 2023-05-13 DIAGNOSIS — I10 Essential (primary) hypertension: Secondary | ICD-10-CM

## 2023-05-13 DIAGNOSIS — L608 Other nail disorders: Secondary | ICD-10-CM

## 2023-05-13 DIAGNOSIS — G894 Chronic pain syndrome: Secondary | ICD-10-CM

## 2023-05-13 DIAGNOSIS — E559 Vitamin D deficiency, unspecified: Secondary | ICD-10-CM

## 2023-05-13 DIAGNOSIS — F330 Major depressive disorder, recurrent, mild: Secondary | ICD-10-CM

## 2023-05-13 DIAGNOSIS — D6869 Other thrombophilia: Secondary | ICD-10-CM

## 2023-05-13 DIAGNOSIS — E118 Type 2 diabetes mellitus with unspecified complications: Secondary | ICD-10-CM

## 2023-05-13 LAB — HM DIABETES EYE EXAM

## 2023-05-13 MED ORDER — EMPAGLIFLOZIN 10 MG PO TABS: 10.0000 mg | ORAL_TABLET | Freq: Every day | ORAL | 1 refills | Status: DC

## 2023-05-13 MED ORDER — FAMOTIDINE 40 MG PO TABS: 40.0000 mg | ORAL_TABLET | Freq: Every day | ORAL | 1 refills | Status: DC

## 2023-05-13 MED ORDER — CLOTRIMAZOLE 1 % EX CREA: 1.0000 | TOPICAL_CREAM | Freq: Two times a day (BID) | CUTANEOUS | 2 refills | Status: DC

## 2023-05-13 MED ORDER — ALBUTEROL SULFATE HFA 108 (90 BASE) MCG/ACT IN AERS: 2.0000 | INHALATION_SPRAY | RESPIRATORY_TRACT | 3 refills | Status: DC | PRN

## 2023-05-13 MED ORDER — AMITRIPTYLINE HCL 50 MG PO TABS: 50.0000 mg | ORAL_TABLET | Freq: Every day | ORAL | 1 refills | Status: DC

## 2023-05-13 MED ORDER — HYDROXYZINE HCL 25 MG PO TABS: 25.0000 mg | ORAL_TABLET | Freq: Three times a day (TID) | ORAL | 1 refills | Status: DC | PRN

## 2023-05-13 NOTE — Progress Notes (Unsigned)
Patient ID: Mary Ferguson, female  DOB: [birth year], 72 y.o.   MRN: [id number] Patient Care Team    Relationship Specialty Notifications Start End  Natalia Leatherwood, DO PCP - General Family Medicine  07/20/22   Croitoru, Rachelle Hora, MD Consulting Physician Cardiology  03/21/21   Mateo Flow, MD Consulting Physician Ophthalmology  03/21/21   Enzo Bi, MD Referring Physician Surgery  03/21/21   Aletha Halim, MD Referring Physician Neurology  03/21/21   Lennette Bihari, MD Consulting Physician Cardiology  03/21/21   Doloris Hall., MD Physician Assistant Sports Medicine  03/21/21   Donnetta Simpers, MD Referring Physician Dermatology  03/21/21   Tomma Lightning, MD Consulting Physician Pulmonary Disease  08/07/22     Chief Complaint  Patient presents with   Diabetes    Subjective: Mary Ferguson is a 72 y.o.  female present for follow-up on [facility] Chronic generalized abdominal pain/Chronic pain syndrome/Neuropathy/Paresthesia of bilateral legs/Compression fracture of L1 vertebra, initial encounter (HCC)/ Lumbar radiculopathy Patient has noticed improvement with her neuropathy with amitriptyline 50 mg nightly. She denies oversedation with the amitriptyline.  She would like to to remain on medication Prior note: Patient has an extensive chronic pain history.  Per her report she has been on very strong high-dose narcotics in the past, and her pain persisted.  She reports a strong family history of substance abuse, and is not interested in narcotics.  She currently is not on any type of medications to help with her discomfort.    B12 deficiency and Vitamin D deficiency She reports compliance with B12 and vitamin D supplementation.    Type II diabetes mellitus with manifestations (HCC) She reports she has started the Jardiance 10 mg daily and is tolerating medication. She is unable to take metformin with her IBS. She did try it again and she has had diarrhea sx.  She has chronic  neuropathy. Patient denies dizziness, hyperglycemic or hypoglycemic events. Patient denies numbness, tingling in the extremities or nonhealing wounds of feet.    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  She is on lifelong anticoagulation therapy.  She reports compliance with Xarelto  Essential hypertension/Dyslipidemia, goal LDL below 70/CAD S/P PCI/History of non-ST elevation myocardial infarction (NSTEMI)//Morbid obesity (HCC)/Coronary artery disease involving native coronary artery of native heart with angina pectoris (HCC)/Aortic atherosclerosis (HCC) Managed by cardiology   Chronic bronchitis, unspecified chronic bronchitis type (HCC)/Moderate persistent asthma, unspecified whether complicated/Nocturnal oxygen desaturation/Obstructive sleep apnea (adult)  Managed by pulmonology     05/13/2023    2:37 PM 09/18/2022    2:51 PM 04/09/2022    8:49 AM  Depression screen PHQ 2/9  Decreased Interest 1 0 0  Down, Depressed, Hopeless 0 1 0  PHQ - 2 Score 1 1 0  Altered sleeping 1 0   Tired, decreased energy 1 1   Change in appetite 1 1   Feeling bad or failure about yourself  0 1   Trouble concentrating 0 0   Moving slowly or fidgety/restless 1 0   Suicidal thoughts 0 0   PHQ-9 Score 5 4   Difficult doing work/chores Somewhat difficult        05/13/2023    2:37 PM 09/18/2022    2:51 PM  GAD 7 : Generalized Anxiety Score  Nervous, Anxious, on Edge 0 0  Control/stop worrying 0 0  Worry too much - different things 0 0  Trouble  relaxing 0 0  Restless 0 0  Easily annoyed or irritable 0 0  Afraid - awful might happen 0 0  Total GAD 7 Score 0 0           05/13/2023    2:36 PM 08/07/2022    8:43 AM 04/06/2022   11:07 AM 10/19/2021    3:19 PM 03/21/2021    2:19 PM  Fall Risk   Falls in the past year? 1 1 1 1 1   Number falls in past yr: 0 1 1 1 1   Injury with Fall? 0 1 1 0 0  Risk for fall  due to : Impaired mobility;Impaired balance/gait;History of fall(s) History of fall(s) History of fall(s) History of fall(s) History of fall(s)  Follow up Falls evaluation completed Falls evaluation completed Falls evaluation completed Falls evaluation completed Falls evaluation completed   Immunization History  Administered Date(s) Administered   Fluad Quad(high Dose 65+) 05/08/2021, 08/07/2022   Influenza, Seasonal, Injecte, Preservative Fre 06/22/2015   Influenza,inj,quad, With Preservative 07/05/2016   Influenza-Unspecified 05/10/2013, 05/05/2014   MODERNA SARS COV-2 Pediatric Vaccination 6mos to <73yrs 08/12/2020, 04/02/2021   Moderna Sars-Covid-2 Vaccination 11/04/2019, 12/02/2019   PNEUMOCOCCAL CONJUGATE-20 04/05/2022   Pneumococcal Conjugate-13 08/26/2004, 10/27/2018   Pneumococcal Polysaccharide-23 10/27/2018    No results found.  Past Medical History:  Diagnosis Date   Acute and chronic respiratory failure with hypoxia (HCC) 01/24/2018   Acute diverticulitis 07/20/2022   Acute kidney injury superimposed on chronic kidney disease (HCC) 07/21/2022   Asthma    Blood in stool    Chicken pox    Chronic generalized abdominal pain    Chronic pain    CKD (chronic kidney disease), stage III (HCC)    Colon polyps    COPD (chronic obstructive pulmonary disease) (HCC)    COVID-19 2020   Depression    Diverticulitis    Very severe.  Has had 2 bowel resections and temporary colostomy in the past.   Fall at home, initial encounter 07/21/2022   Fecal incontinence    Frequent headaches    GERD (gastroesophageal reflux disease)    Heart attack (HCC)    Heart disease    High anion gap metabolic acidosis 07/21/2022   History of blood transfusion    Hyperlipemia    Hypertension    Incontinence of feces 12/11/2016   Morbid obesity (HCC)    Pneumonia due to infectious organism 01/12/2018   Postmenopausal 03/21/2021   PTSD (post-traumatic stress disorder)    Recurrent deep vein  thrombosis (DVT) (HCC)    x6   Rhabdomyolysis 07/21/2022   Urinary incontinence    Allergies  Allergen Reactions   Rofecoxib Anaphylaxis    VIOXX   Metformin And Related Other (See Comments)    Ibs    Sulfonylureas Other (See Comments)    Dizziness   Benadryl Allergy [Diphenhydramine Hcl] Other (See Comments)    Depressed respirations   Cephalexin Rash   Gatifloxacin Other (See Comments)    Confusion Very low blood pressure Teequinn   Past Surgical History:  Procedure Laterality Date   ABDOMINAL HYSTERECTOMY  1995   APPENDECTOMY     CATARACT EXTRACTION Bilateral    CESAREAN SECTION  1984   CHOLECYSTECTOMY  2016   COLOSTOMY     and reversal   CORONARY STENT INTERVENTION N/A 06/18/2019   Procedure: CORONARY STENT INTERVENTION;  Surgeon: Iran Ouch, MD;  Location: [facility];  Service: Cardiovascular;  Laterality: N/A;   ENDOMETRIAL ABLATION  HERNIA REPAIR  1985   1985 and 2016 (2016 ventral w/ incarceration) 33 surgeries   HYSTEROSCOPY WITH D & C     13   LAPAROSCOPIC LYSIS OF ADHESIONS     x2   LEFT HEART CATH AND CORONARY ANGIOGRAPHY N/A 06/18/2019   Procedure: LEFT HEART CATH AND CORONARY ANGIOGRAPHY;  Surgeon: Iran Ouch, MD;  Location: [facility];  Service: Cardiovascular;  Laterality: N/A;   LEFT HEART CATH AND CORONARY ANGIOGRAPHY N/A 11/30/2019   Procedure: LEFT HEART CATH AND CORONARY ANGIOGRAPHY;  Surgeon: Lennette Bihari, MD;  Location: [facility];  Service: Cardiovascular;  Laterality: N/A;   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   TONSILLECTOMY  1959   VEIN SURGERY     laser x2   Family History  Problem Relation Age of Onset   Depression Mother    Drug abuse Mother    Mental illness Mother    Learning disabilities Mother    Miscarriages / [place] Mother    Alcohol abuse Mother    Mental illness Father    Depression Father    Heart disease Father    Drug abuse Sister    Depression Sister     Asthma Sister    Alcohol abuse Sister    Early death Sister    Mental illness Son    Hypertension Son    Drug abuse Son    Depression Son    Asthma Son    Alcohol abuse Son    Clotting disorder Son    Heart disease Maternal Grandmother    Diabetes Maternal Grandmother    Arthritis Maternal Grandmother    COPD Maternal Grandmother    Parkinson's disease Maternal Grandmother    Heart disease Maternal Grandfather    Heart attack Maternal Grandfather    Early death Maternal Grandfather    COPD Maternal Grandfather    Heart disease Paternal Grandmother    Heart attack Paternal Grandmother    Depression Paternal Grandmother    Arthritis Paternal Grandmother    Breast cancer Paternal Grandmother    Heart disease Paternal Grandfather    Heart attack Paternal Grandfather    Early death Paternal Grandfather    COPD Paternal Grandfather    Mental illness Half-Brother    Learning disabilities Half-Brother    Hypertension Half-Brother    Hyperlipidemia Half-Brother    Drug abuse Half-Brother    Heart disease Half-Brother    Diabetes Half-Brother    Depression Half-Brother    Asthma Half-Brother    Alcohol abuse Half-Brother    Arthritis Half-Brother    Kidney disease Half-Brother    Testicular cancer Half-Brother    Diabetes Half-Sister    Mental illness Half-Sister    Learning disabilities Half-Sister    Drug abuse Half-Sister    Depression Half-Sister    Alcohol abuse Half-Sister    Thyroid cancer Half-Sister    Social History   Social History Narrative   Marital status/children/pets:    Education/employment: retired     - wears seatbelt: Yes     - Feels safe in their relationships: Yes       Allergies as of 05/13/2023       Reactions   Rofecoxib Anaphylaxis   VIOXX   Metformin And Related Other (See Comments)   Ibs    Sulfonylureas Other (See Comments)   Dizziness   Benadryl Allergy [diphenhydramine Hcl] Other (See Comments)   Depressed respirations    Cephalexin Rash  Gatifloxacin Other (See Comments)   Confusion Very low blood pressure Teequinn        Medication List        Accurate as of May 13, 2023 11:59 PM. If you have any questions, ask your nurse or doctor.          acetaminophen 500 MG tablet Commonly known as: TYLENOL Take 500-1,000 mg by mouth as needed for mild pain or moderate pain.   albuterol 108 (90 Base) MCG/ACT inhaler Commonly known as: VENTOLIN HFA Inhale 2 puffs into the lungs every 4 (four) hours as needed for wheezing or shortness of breath.   amitriptyline 50 MG tablet Commonly known as: ELAVIL Take 1 tablet (50 mg total) by mouth at bedtime.   amLODipine 2.5 MG tablet Commonly known as: NORVASC TAKE 1 TABLET(2.5 MG) BY MOUTH DAILY   atorvastatin 80 MG tablet Commonly known as: LIPITOR TAKE 1 TABLET(80 MG) BY MOUTH EVERY EVENING   carvedilol 6.25 MG tablet Commonly known as: COREG TAKE 1 TABLET BY MOUTH EVERY MORNING AND 2 TABLETS EVERY EVENING. 1st attempt. Pt needs to keep Sept. Appt for any future refills.   clotrimazole 1 % cream Commonly known as: Clotrimazole Anti-Fungal Apply 1 Application topically 2 (two) times daily.   dicyclomine 20 MG tablet Commonly known as: BENTYL Take 1 tablet (20 mg total) by mouth 4 (four) times daily as needed for spasms.   diphenoxylate-atropine 2.5-0.025 MG tablet Commonly known as: LOMOTIL Take 1 tablet by mouth 4 (four) times daily as needed for diarrhea or loose stools.   empagliflozin 10 MG Tabs tablet Commonly known as: JARDIANCE Take 1 tablet (10 mg total) by mouth daily before breakfast.   famotidine 40 MG tablet Commonly known as: PEPCID Take 1 tablet (40 mg total) by mouth at bedtime.   hydrOXYzine 25 MG tablet Commonly known as: ATARAX Take 1 tablet (25 mg total) by mouth every 8 (eight) hours as needed for itching.   ipratropium-albuterol 0.5-2.5 (3) MG/3ML Soln Commonly known as: DUONEB Take 3 mLs by nebulization  every 4 (four) hours as needed.   isosorbide mononitrate 60 MG 24 hr tablet Commonly known as: IMDUR TAKE 1 TABLET(60 MG) BY MOUTH DAILY   magnesium oxide 400 (240 Mg) MG tablet Commonly known as: MAGnesium-Oxide Take 1 tablet (400 mg total) by mouth 2 (two) times daily.   metFORMIN 500 MG tablet Commonly known as: GLUCOPHAGE Take 1 tablet (500 mg total) by mouth 2 (two) times daily with a meal.   nitroGLYCERIN 0.4 MG SL tablet Commonly known as: NITROSTAT PLACE 1 TABLET UNDER THE TONGUE EVERY 5 MINUTES FOR 3 DOSES AS NEEDED FOR CHEST PAIN What changed: See the new instructions.   psyllium 95 % Pack Commonly known as: HYDROCIL/METAMUCIL Take 1 packet by mouth daily.   True Metrix Blood Glucose Test test strip Generic drug: glucose blood Use as instructed   TRUEplus Lancets 33G Misc 1 Stick by Does not apply route daily.   Vitamin D3 50 MCG (2000 UT) capsule Take 2 capsules (4,000 Units total) by mouth daily.   Xarelto 20 MG Tabs tablet Generic drug: rivaroxaban TAKE 1 TABLET(20 MG) BY MOUTH EVERY EVENING        All past medical history, surgical history, allergies, family history, immunizations andmedications were updated in the EMR today and reviewed under the history and medication portions of their EMR.         ROS: 14 pt review of systems performed and negative (unless mentioned in an  HPI)  Objective: BP 98/64   Pulse (!) 105   Temp 97.9 F (36.6 C)   Wt 232 lb 12.8 oz (105.6 kg)   SpO2 94%   BMI 42.58 kg/m  Physical Exam Vitals and nursing note reviewed.  Constitutional:      General: She is not in acute distress.    Appearance: Normal appearance. She is not ill-appearing, toxic-appearing or diaphoretic.  HENT:     Head: Normocephalic and atraumatic.  Eyes:     General: No scleral icterus.       Right eye: No discharge.        Left eye: No discharge.     Extraocular Movements: Extraocular movements intact.     Conjunctiva/sclera: Conjunctivae  normal.     Pupils: Pupils are equal, round, and reactive to light.  Cardiovascular:     Rate and Rhythm: Normal rate and regular rhythm.  Pulmonary:     Effort: Pulmonary effort is normal. No respiratory distress.     Breath sounds: Normal breath sounds. No wheezing, rhonchi or rales.  Musculoskeletal:     Right lower leg: No edema.     Left lower leg: No edema.  Skin:    General: Skin is warm.     Findings: No rash.  Neurological:     Mental Status: She is alert and oriented to person, place, and time. Mental status is at baseline.     Motor: No weakness.     Gait: Gait normal.  Psychiatric:        Mood and Affect: Mood normal.        Behavior: Behavior normal.        Thought Content: Thought content normal.        Judgment: Judgment normal.    Diabetic Foot Exam - Simple   Simple Foot Form  05/13/2023  4:30 PM  Visual Inspection See comments: Yes Sensation Testing See comments: Yes Pulse Check See comments: Yes Comments Left toenail thickening and lifting with discoloration.  Decreased but equal bilateral posterior tibialis pulses.  Chronic neuropathy present.     Assessment/plan: Mary Ferguson is a 72 y.o. female present for chronic condition management Chronic pain syndrome/Neuropathy/Paresthesia of bilateral legs/Compression fracture of L1 vertebra, initial encounter (HCC)/ Lumbar radiculopathy/IBS-like pain -She has reportedly been on many pain medicines in the past and did not find them helpful.  She is not interested in narcotics.  She has history of compression fraction and lumbar radiculopathy (L4-L5 region). I believe her pain is multifactorial.   -Consider Cymbalta-may be an option later. -Consider Lyrica-also may be an option later.   Continue Elavil 50 mg nightly.  She understands not stop abruptly because she does not think it is working.  Hopefully this will help with neuropathic pain, depression and she may benefit with GI complaints. -NSAIDs  contraindicated with her cardiac history and Xarelto use. -She is currently being managed with lower back pain by Ortho team with injections. -Referral to vascular and vein had been placed for her   B12 deficiency and Vitamin D deficiency Continue oral supplements .  GERD: Stable. Continue Protonix  Type II diabetes mellitus with manifestations (HCC) A1c increased to 7.2 > 7.1> 7.2 >collected today Tolerating jardiance 10 mg every day.  Metformin has been tried twice and has caused by GI symptoms both times.   Sulfonylurea class not ideal with her gait instability and side effect of dizziness  Patient is to focus on her diet,.  Follow a diabetic diet. 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  Essential hypertension/Dyslipidemia, goal LDL below 70/CAD S/P PCI/History of non-ST elevation myocardial infarction (NSTEMI)//Morbid obesity (HCC)/Coronary artery disease involving native coronary artery of native heart with angina pectoris (HCC)/Aortic atherosclerosis (HCC) Stable Medications are managed by cardiology-amlodipine, atorvastatin, carvedilol, Imdur, nitro, Xarelto  Chronic bronchitis, unspecified chronic bronchitis type (HCC)/Moderate persistent asthma, unspecified whether complicated/Nocturnal oxygen desaturation/Obstructive sleep apnea (adult)  Patient is having fatigue with reported history of COPD, asthma, OSA and use of oxygen at night.   - Ambulatory referral to Pulmonology placed prior> pt has not followed up since 07/2021  Influenza vaccine administered today.  Return in about 24 weeks (around 10/28/2023) for Routine chronic condition follow-up.  Orders Placed This Encounter  Procedures   Flu  Vaccine Trivalent High Dose (Fluad)   Vitamin D (25 hydroxy)   Comp Met (CMET)   CBC   TSH   Lipid panel   Hemoglobin A1c   Ambulatory referral to Podiatry     Meds ordered this encounter  Medications   amitriptyline (ELAVIL) 50 MG tablet    Sig: Take 1 tablet (50 mg total) by mouth at bedtime.    Dispense:  90 tablet    Refill:  1   albuterol (VENTOLIN HFA) 108 (90 Base) MCG/ACT inhaler    Sig: Inhale 2 puffs into the lungs every 4 (four) hours as needed for wheezing or shortness of breath.    Dispense:  1 each    Refill:  3   clotrimazole (CLOTRIMAZOLE ANTI-FUNGAL) 1 % cream    Sig: Apply 1 Application topically 2 (two) times daily.    Dispense:  30 g    Refill:  2   famotidine (PEPCID) 40 MG tablet    Sig: Take 1 tablet (40 mg total) by mouth at bedtime.    Dispense:  90 tablet    Refill:  1   hydrOXYzine (ATARAX) 25 MG tablet    Sig: Take 1 tablet (25 mg total) by mouth every 8 (eight) hours as needed for itching.    Dispense:  270 tablet    Refill:  1   empagliflozin (JARDIANCE) 10 MG TABS tablet    Sig: Take 1 tablet (10 mg total) by mouth daily before breakfast.    Dispense:  90 tablet    Refill:  1     Referral Orders         Ambulatory referral to Podiatry     Note is dictated utilizing voice recognition software. Although note has been proof read prior to signing, occasional typographical errors still can be missed. If any questions arise, please do not hesitate to call for verification.  Electronically signed by: Felix Pacini, DO [facility] [place]

## 2023-05-13 NOTE — Patient Instructions (Addendum)

## 2023-05-15 ENCOUNTER — Telehealth: Payer: Self-pay | Admitting: Family Medicine

## 2023-05-15 NOTE — Telephone Encounter (Signed)
Please call patient Liver and thyroid function are normal Knee function is stable Blood cell counts and electrolytes are normal A1c/diabetes is improving with an A1c of 6.9 now. Cholesterol panel looks great and is at goal Vitamin D levels are still mildly low at 23.24-continue supplementation daily

## 2023-05-16 NOTE — Telephone Encounter (Signed)
Spoke with patient's son regarding results/recommendations.  ?

## 2023-05-26 ENCOUNTER — Ambulatory Visit (INDEPENDENT_AMBULATORY_CARE_PROVIDER_SITE_OTHER): Payer: Medicare Other

## 2023-05-26 ENCOUNTER — Ambulatory Visit: Payer: BLUE CROSS/BLUE SHIELD | Admitting: Cardiovascular Disease

## 2023-05-26 DIAGNOSIS — Z0000 Encounter for general adult medical examination without abnormal findings: Secondary | ICD-10-CM | POA: Diagnosis not present
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
## 2023-05-27 NOTE — Patient Instructions (Signed)

## 2023-05-30 ENCOUNTER — Telehealth: Payer: Self-pay

## 2023-05-30 NOTE — Telephone Encounter (Signed)
Received DM eye exam results. Will place on PCP desk for review.

## 2023-05-30 NOTE — Telephone Encounter (Signed)
Please inform patient diabetic eye exam is normal

## 2023-06-02 NOTE — Telephone Encounter (Signed)
Spoke with patient regarding results/recommendations.  

## 2023-06-04 ENCOUNTER — Encounter: Payer: Self-pay | Admitting: Family Medicine

## 2023-06-17 ENCOUNTER — Other Ambulatory Visit: Payer: Self-pay | Admitting: Cardiovascular Disease

## 2023-06-17 DIAGNOSIS — Z86718 Personal history of other venous thrombosis and embolism: Secondary | ICD-10-CM

## 2023-06-17 NOTE — Telephone Encounter (Signed)
Prescription refill request for Xarelto received.  Indication:dvt Last office visit:upcoming Weight:105.6  kg Age:72 Scr:1.2  9/24 CrCl:71.68  ml/min  Prescription refilled

## 2023-07-02 ENCOUNTER — Ambulatory Visit: Payer: Medicare Other | Attending: Cardiovascular Disease | Admitting: Physician Assistant

## 2023-07-02 ENCOUNTER — Encounter: Payer: Self-pay | Admitting: Physician Assistant

## 2023-07-02 VITALS — BP 130/78 | HR 88 | Ht 62.0 in | Wt 238.0 lb

## 2023-07-02 DIAGNOSIS — I10 Essential (primary) hypertension: Secondary | ICD-10-CM | POA: Diagnosis present

## 2023-07-02 DIAGNOSIS — R002 Palpitations: Secondary | ICD-10-CM

## 2023-07-02 DIAGNOSIS — I25119 Atherosclerotic heart disease of native coronary artery with unspecified angina pectoris: Secondary | ICD-10-CM | POA: Diagnosis not present

## 2023-07-02 DIAGNOSIS — R0789 Other chest pain: Secondary | ICD-10-CM

## 2023-07-02 DIAGNOSIS — R42 Dizziness and giddiness: Secondary | ICD-10-CM | POA: Diagnosis not present

## 2023-07-02 DIAGNOSIS — G4733 Obstructive sleep apnea (adult) (pediatric): Secondary | ICD-10-CM | POA: Diagnosis not present

## 2023-07-02 NOTE — Patient Instructions (Addendum)
Medication Instructions:  NO CHANGES *If you need a refill on your cardiac medications before your next appointment, please call your pharmacy*   Lab Work: NO LABS If you have labs (blood work) drawn today and your tests are completely normal, you will receive your results only by: MyChart Message (if you have MyChart) OR A paper copy in the mail If you have any lab test that is abnormal or we need to change your treatment, we will call you to review the results.   Testing/Procedures:[address] Your physician has requested that you have a carotid duplex. This test is an ultrasound of the carotid arteries in your neck. It looks at blood flow through these arteries that supply the brain with blood. Allow one hour for this exam. There are no restrictions or special instructions.    Follow-Up: At [facility], you and your health needs are our priority.  As part of our continuing mission to provide you with exceptional heart care, we have created designated Provider Care Teams.  These Care Teams include your primary Cardiologist (physician) and Advanced Practice Providers (APPs -  Physician Assistants and Nurse Practitioners) who all work together to provide you with the care you need, when you need it.  We recommend signing up for the patient portal called "MyChart".  Sign up information is provided on this After Visit Summary.  MyChart is used to connect with patients for Virtual Visits (Telemedicine).  Patients are able to view lab/test results, encounter notes, upcoming appointments, etc.  Non-urgent messages can be sent to your provider as well.   To learn more about what you can do with MyChart, go to [email or web address].    Your next appointment:   1 year(s)  Provider:   Nila Nephew, MD  Other Instructions How to Prepare for Your Cardiac PET/CT Stress Test:  1. You can take normal medications before your test:   2. Nothing to eat or drink, except  water, 3 hours prior to arrival time.   NO caffeine/decaffeinated products, or chocolate 12 hours prior to arrival.  3. NO perfume, cologne or lotion on chest or abdomen area.          - FEMALES - Please avoid wearing dresses to this appointment.  4. Total time is 1 to 2 hours; you may want to bring reading material for the waiting time.  5. Please report to Radiology at the [facility] Main Entrance 30 minutes early for your test.  [address] [place], [place] [address]   IF YOU THINK YOU MAY BE PREGNANT, OR ARE NURSING PLEASE INFORM THE TECHNOLOGIST.  In preparation for your appointment, medication and supplies will be purchased.  Appointment availability is limited, so if you need to cancel or reschedule, please call the Radiology Department at [phone] [facility]) 24 hours in advance to avoid a cancellation fee of $100.00  What to Expect After you Arrive:  Once you arrive and check in for your appointment, you will be taken to a preparation room within the Radiology Department.  A technologist or Nurse will obtain your medical history, verify that you are correctly prepped for the exam, and explain the procedure.  Afterwards,  an IV will be started in your arm and electrodes will be placed on your skin for EKG monitoring during the stress portion of the exam. Then you will be escorted to the PET/CT scanner.  There, staff will get you positioned on the scanner and obtain a  blood pressure and EKG.  During the exam, you will continue to be connected to the EKG and blood pressure machines.  A small, safe amount of a radioactive tracer will be injected in your IV to obtain a series of pictures of your heart along with an injection of a stress agent.    After your Exam:  It is recommended that you eat a meal and drink a caffeinated beverage to counter act any effects of the stress agent.  Drink plenty of fluids for the remainder of the day and urinate frequently for the  first couple of hours after the exam.  Your doctor will inform you of your test results within 7-10 business days.  For more information and frequently asked questions, please visit our website : [email or web address]  For questions about your test or how to prepare for your test, please call: Cardiac Imaging Nurse Navigators Office: [phone]

## 2023-07-02 NOTE — Progress Notes (Signed)
Cardiology Office Note:  .   Date:  07/02/2023  ID:  Mary Ferguson, DOB 1951/01/22, MRN [id number] PCP: Natalia Leatherwood, DO  [facility] Providers Cardiologist:  Thurmon Fair, MD     History of Present Illness: .   Mary Ferguson is a 72 y.o. female with PMH of hypertension, hyperlipidemia, DM2, morbid obesity, recurrent DVT, CKD stage III, chronic pain and CAD.  Patient had a inferior wall STEMI in October 2020 and received a DES to mid and distal RCA.  At the time patient also had a 70% mid LAD residual.  She had transient LV dysfunction with EF of 40% which improved to 60 to 65% on follow-up echo on the following day.  During her cath, her LVEDP was elevated at 22 mmHg.  Repeat angiography in April 2021 showed no significant change.  She has chronic bilateral lower extremity edema that is symmetrical.  She is on Xarelto for history of recurrent DVT.  Patient was last seen by Dr. Royann Shivers in August 2023 at which time she was quite sedentary.  Since the last visit, patient had a hospitalization in November 2023 after a fall.  She had mildly elevated LFT and a CK greater than 1500.  She was diagnosed with rhabdomyolysis and AKI.   Patient presents today accompanied by son.  She has 2 major complaints.  First one was episodes of dizziness.  She is chronically frail and does not do much strenuous activity.  She has been having episodes of dizziness when she quickly turn her head.  Symptom is not exacerbated by sitting up or bending over.  I will obtain a carotid Doppler to rule out significant carotid artery disease.  If negative, she may be a candidate for vestibular rehab.  Symptom is not consistent with orthostatic hypotension.  Patient also complains of intermittent chest pain that occurs once every several weeks, there has been no significant increase in the frequency of the chest pain.  Her chest pain occurs when she walks around or when she is emotionally stressed.  The characteristic of the  chest pain is very similar to the previous heart attack in 2020, I recommended a PET stress test.  On exam, she has no lower extremity edema, orthopnea or PND.  ROS:   Patient has been having some chest pain and dizziness.      Studies Reviewed: Marland Kitchen   EKG Interpretation Date/Time:  Wednesday July 02 2023 15:24:06 EST Ventricular Rate:  88 PR Interval:  188 QRS Duration:  78 QT Interval:  370 QTC Calculation: 447 R Axis:   -27  Text Interpretation: Normal sinus rhythm Septal infarct , age undetermined When compared with ECG of 12-Aug-2022 07:39, PREVIOUS ECG IS PRESENT Confirmed by Azalee Course [phone] on 07/02/2023 4:44:10 PM     Risk Assessment/Calculations:             Physical Exam:   VS:  BP 130/78 (BP Location: Left Arm, Patient Position: Sitting, Cuff Size: Large)   Pulse 88   Ht 5\' 2"  (1.575 m)   Wt 238 lb (108 kg)   BMI 43.53 kg/m    Wt Readings from Last 3 Encounters:  07/02/23 238 lb (108 kg)  05/13/23 232 lb 12.8 oz (105.6 kg)  09/18/22 240 lb (108.9 kg)    GEN: Well nourished, well developed in no acute distress NECK: No JVD; No carotid bruits CARDIAC: RRR, no murmurs, rubs, gallops RESPIRATORY:  Clear to auscultation without rales, wheezing or rhonchi  ABDOMEN: Soft,  non-tender, non-distended EXTREMITIES:  No edema; No deformity   ASSESSMENT AND PLAN: .    CAD Patient had an inferior wall myocardial infarction in October 2020 with DES placement in distal RCA. Residual 70% mid-LAD lesion. LVEDP was elevated at 22 mmHg. Repeat angiography in April 2021 showed no significant change. Patient reports recurrent chest pain associated with stress and physical activity. Pain is similar to previous myocardial infarction in 2020. Patient has been using nitroglycerin for symptom relief. -Order a nuclear stress test with Lexiscan to evaluate for ischemia. -If stress test is normal, no further cardiac workup needed and follow-up in one year.  Dizziness Patient  reports recurrent dizziness, particularly with changes in head position. No carotid bruits on examination. -Order carotid ultrasound to evaluate for carotid artery disease. -If carotid ultrasound is normal, recommend discussion with primary care provider regarding vestibular rehabilitation.  Hypertension: Blood pressure stable  Sleep Apnea Patient has been referred for a home sleep study. -Continue with planned home sleep study.    Informed Consent   Shared Decision Making/Informed Consent The risks [chest pain, shortness of breath, cardiac arrhythmias, dizziness, blood pressure fluctuations, myocardial infarction, stroke/transient ischemic attack, nausea, vomiting, allergic reaction, radiation exposure, metallic taste sensation and life-threatening complications (estimated to be 1 in 10,000)], benefits (risk stratification, diagnosing coronary artery disease, treatment guidance) and alternatives of a cardiac PET stress test were discussed in detail with Mary Ferguson and she agrees to proceed.     Dispo: Follow-up with Dr. Royann Shivers  Signed, Azalee Course, [place]

## 2023-07-10 ENCOUNTER — Telehealth: Payer: Self-pay | Admitting: Cardiovascular Disease

## 2023-07-10 MED ORDER — CARVEDILOL 6.25 MG PO TABS: ORAL_TABLET | ORAL | 3 refills | Status: DC

## 2023-07-10 MED ORDER — NITROGLYCERIN 0.4 MG SL SUBL: 0.4000 mg | SUBLINGUAL_TABLET | SUBLINGUAL | 11 refills | Status: AC | PRN

## 2023-07-10 NOTE — Telephone Encounter (Signed)
*  STAT* If patient is at the pharmacy, call can be transferred to refill team.   1. Which medications need to be refilled? (please list name of each medication and dose if known)   carvedilol (COREG) 6.25 MG tablet  nitroGLYCERIN (NITROSTAT) 0.4 MG SL tablet   2. Which pharmacy/location (including street and city if local pharmacy) is medication to be sent to?  [id number] - [place],  - [address]    3. Do they need a 30 day or 90 day supply? 90    Patient only has 3 days left of this medication.

## 2023-07-10 NOTE — Telephone Encounter (Signed)
Sent the RX to requested Pharmacy

## 2023-07-10 NOTE — Telephone Encounter (Signed)
Sent RX to requested Pharmacy

## 2023-07-10 NOTE — Telephone Encounter (Signed)
Patient calling back. She states the pharmacy received the carvedilol prescription as 90 tablets instead of 90 day supply. I called the pharmacy and they confirmed they have a 90 tablet prescription. Patient requests the prescription be sent back for a 90 day supply, 270 tablets.

## 2023-07-10 NOTE — Addendum Note (Signed)
Addended by: Adriana Simas, Nonie Lochner L on: 07/10/2023 04:06 PM   Modules accepted: Orders

## 2023-07-16 ENCOUNTER — Ambulatory Visit (HOSPITAL_COMMUNITY)
Admission: RE | Admit: 2023-07-16 | Payer: Medicare Other | Source: Ambulatory Visit | Attending: Physician Assistant | Admitting: Physician Assistant

## 2023-09-23 ENCOUNTER — Telehealth (HOSPITAL_COMMUNITY): Payer: Self-pay | Admitting: *Deleted

## 2023-09-23 NOTE — Telephone Encounter (Signed)
Attempted to call patient regarding upcoming cardiac PET appointment. Left message on voicemail with name and callback number  Larey Brick RN Navigator Cardiac Imaging [facility] [phone] Office [phone] Cell  Reminder to avoid caffeine and Imdur prior to appt.

## 2023-09-24 ENCOUNTER — Ambulatory Visit (HOSPITAL_COMMUNITY): Admission: RE | Admit: 2023-09-24 | Payer: Medicare Other | Source: Ambulatory Visit

## 2023-10-07 ENCOUNTER — Other Ambulatory Visit: Payer: Self-pay | Admitting: Cardiovascular Disease

## 2023-10-07 ENCOUNTER — Other Ambulatory Visit: Payer: Self-pay | Admitting: Family Medicine

## 2023-10-07 DIAGNOSIS — Z86718 Personal history of other venous thrombosis and embolism: Secondary | ICD-10-CM

## 2023-10-07 MED ORDER — CARVEDILOL 6.25 MG PO TABS: ORAL_TABLET | ORAL | 3 refills | Status: DC

## 2023-10-07 MED ORDER — EMPAGLIFLOZIN 10 MG PO TABS: 10.0000 mg | ORAL_TABLET | Freq: Every day | ORAL | 0 refills | Status: DC

## 2023-10-07 MED ORDER — ISOSORBIDE MONONITRATE ER 60 MG PO TB24: ORAL_TABLET | ORAL | 3 refills | Status: DC

## 2023-10-07 MED ORDER — RIVAROXABAN 20 MG PO TABS: 20.0000 mg | ORAL_TABLET | Freq: Every day | ORAL | 1 refills | Status: DC

## 2023-10-07 MED ORDER — ATORVASTATIN CALCIUM 80 MG PO TABS: 80.0000 mg | ORAL_TABLET | Freq: Every evening | ORAL | 3 refills | Status: AC

## 2023-10-07 NOTE — Telephone Encounter (Signed)
Prescription refill request for Xarelto received.  Indication:dvt Last office visit:11/24 Weight:108  kg Age:73 Scr:1.20  9/24 CrCl:73.31  ml/min  Prescription refilled

## 2023-10-07 NOTE — Telephone Encounter (Signed)
Pt's medications were sent to pt's pharmacy as requested. Confirmation received.

## 2023-10-07 NOTE — Telephone Encounter (Signed)
Last Fill: 05/13/23  Last OV: 05/13/23 Next OV: 10/28/23  Routing to provider for review/authorization.

## 2023-10-07 NOTE — Telephone Encounter (Signed)
Copied from CRM [id number]. Topic: Clinical - Medication Refill >> Oct 07, 2023  2:48 PM Thomes Dinning wrote: Most Recent Primary Care Visit:  Provider: Filomena Jungling  Department: [facility]  Visit Type: MEDICARE AWV, SEQUENTIAL  Date: 05/26/2023  Medication: 90 DAY SUPPLIES are NEEDED 1.) empagliflozin (JARDIANCE) 10 MG TABS tablet  Has the patient contacted their pharmacy? Yes (Agent: If no, request that the patient contact the pharmacy for the refill. If patient does not wish to contact the pharmacy document the reason why and proceed with request.) (Agent: If yes, when and what did the pharmacy advise?) Patient was advised to call the provider  Is this the correct pharmacy for this prescription? Yes If no, delete pharmacy and type the correct one.  This is the patient's preferred pharmacy:  [id number] - [place], [place] - [address] [place] [address] Phone: [phone] Fax: [phone]   Has the prescription been filled recently? No  Is the patient out of the medication? Yes  Has the patient been seen for an appointment in the last year OR does the patient have an upcoming appointment? Yes  Can we respond through MyChart? No  Agent: Please be advised that Rx refills may take up to 3 business days. We ask that you follow-up with your pharmacy.

## 2023-10-07 NOTE — Telephone Encounter (Signed)
*  STAT* If patient is at the pharmacy, call can be transferred to refill team.   1. Which medications need to be refilled? (please list name of each medication and dose if known)   atorvastatin (LIPITOR) 80 MG tablet  carvedilol (COREG) 6.25 MG tablet  isosorbide mononitrate (IMDUR) 60 MG 24 hr tablet  XARELTO 20 MG TABS tablet   2. Which pharmacy/location (including street and city if local pharmacy) is medication to be sent to? [id number] - [place], [place] - [address]   3. Do they need a 30 day or 90 day supply? 90

## 2023-10-10 MED ORDER — EMPAGLIFLOZIN 10 MG PO TABS: 10.0000 mg | ORAL_TABLET | Freq: Every day | ORAL | 0 refills | Status: DC

## 2023-10-10 NOTE — Addendum Note (Signed)
Addended by: Filomena Jungling on: 10/10/2023 01:46 PM   Modules accepted: Orders

## 2023-10-28 ENCOUNTER — Ambulatory Visit: Payer: BLUE CROSS/BLUE SHIELD | Admitting: Family Medicine

## 2023-10-28 ENCOUNTER — Encounter: Payer: Self-pay | Admitting: Family Medicine

## 2023-10-28 VITALS — BP 136/80 | HR 88 | Ht 62.0 in | Wt 240.8 lb

## 2023-10-28 DIAGNOSIS — E118 Type 2 diabetes mellitus with unspecified complications: Secondary | ICD-10-CM

## 2023-10-28 DIAGNOSIS — I10 Essential (primary) hypertension: Secondary | ICD-10-CM

## 2023-10-28 DIAGNOSIS — Z7984 Long term (current) use of oral hypoglycemic drugs: Secondary | ICD-10-CM

## 2023-10-28 DIAGNOSIS — G629 Polyneuropathy, unspecified: Secondary | ICD-10-CM

## 2023-10-28 DIAGNOSIS — I252 Old myocardial infarction: Secondary | ICD-10-CM

## 2023-10-28 DIAGNOSIS — R202 Paresthesia of skin: Secondary | ICD-10-CM

## 2023-10-28 DIAGNOSIS — E119 Type 2 diabetes mellitus without complications: Secondary | ICD-10-CM | POA: Diagnosis not present

## 2023-10-28 DIAGNOSIS — E559 Vitamin D deficiency, unspecified: Secondary | ICD-10-CM

## 2023-10-28 DIAGNOSIS — E538 Deficiency of other specified B group vitamins: Secondary | ICD-10-CM

## 2023-10-28 DIAGNOSIS — F330 Major depressive disorder, recurrent, mild: Secondary | ICD-10-CM

## 2023-10-28 DIAGNOSIS — E785 Hyperlipidemia, unspecified: Secondary | ICD-10-CM

## 2023-10-28 DIAGNOSIS — N1831 Chronic kidney disease, stage 3a: Secondary | ICD-10-CM

## 2023-10-28 DIAGNOSIS — I700 Atherosclerosis of aorta: Secondary | ICD-10-CM

## 2023-10-28 DIAGNOSIS — D6869 Other thrombophilia: Secondary | ICD-10-CM

## 2023-10-28 MED ORDER — FUROSEMIDE 20 MG PO TABS: 20.0000 mg | ORAL_TABLET | Freq: Every day | ORAL | 3 refills | Status: DC | PRN

## 2023-10-28 MED ORDER — MAGNESIUM OXIDE -MG SUPPLEMENT 400 (240 MG) MG PO TABS: 400.0000 mg | ORAL_TABLET | Freq: Two times a day (BID) | ORAL | 3 refills | Status: DC

## 2023-10-28 MED ORDER — EMPAGLIFLOZIN 10 MG PO TABS: 10.0000 mg | ORAL_TABLET | Freq: Every day | ORAL | 1 refills | Status: DC

## 2023-10-28 MED ORDER — HYDROXYZINE HCL 25 MG PO TABS: 25.0000 mg | ORAL_TABLET | Freq: Three times a day (TID) | ORAL | 1 refills | Status: DC | PRN

## 2023-10-28 MED ORDER — ALBUTEROL SULFATE HFA 108 (90 BASE) MCG/ACT IN AERS: 2.0000 | INHALATION_SPRAY | RESPIRATORY_TRACT | 3 refills | Status: DC | PRN

## 2023-10-28 MED ORDER — FAMOTIDINE 40 MG PO TABS: 40.0000 mg | ORAL_TABLET | Freq: Every day | ORAL | 1 refills | Status: DC

## 2023-10-28 MED ORDER — AMITRIPTYLINE HCL 75 MG PO TABS: 75.0000 mg | ORAL_TABLET | Freq: Every day | ORAL | 1 refills | Status: DC

## 2023-10-28 MED ORDER — VITAMIN D3 50 MCG (2000 UT) PO CAPS: 4000.0000 [IU] | ORAL_CAPSULE | Freq: Every day | ORAL | 3 refills | Status: DC

## 2023-10-28 NOTE — Progress Notes (Signed)
 Patient ID: Mary Ferguson, female  DOB: Oct 30, 1950, 73 y.o.   MRN: [id number] Patient Care Team    Relationship Specialty Notifications Start End  Natalia Leatherwood, DO PCP - General Family Medicine  07/20/22   Thurmon Fair, MD PCP - Cardiology Cardiology  07/02/23   Thurmon Fair, MD Consulting Physician Cardiology  03/21/21   Mateo Flow, MD Consulting Physician Ophthalmology  03/21/21   Enzo Bi, MD Referring Physician Surgery  03/21/21   Aletha Halim, MD Referring Physician Neurology  03/21/21   Lennette Bihari, MD Consulting Physician Cardiology  03/21/21   Doloris Hall., MD Physician Assistant Sports Medicine  03/21/21   Donnetta Simpers, MD Referring Physician Dermatology  03/21/21   Tomma Lightning, MD Consulting Physician Pulmonary Disease  08/07/22     Chief Complaint  Patient presents with   Medical Management of Chronic Issues    Subjective: Mary Ferguson is a 73 y.o.  female present for follow-up on [facility] Chronic generalized abdominal pain/Chronic pain syndrome/Neuropathy/Paresthesia of bilateral legs/Compression fracture of L1 vertebra, initial encounter (HCC)/ Lumbar radiculopathy Patient has noticed fair amount of improvement  with her neuropathy with amitriptyline 50 mg nightly. She denies oversedation with the amitriptyline.  She would like to to remain on medication Prior note: Patient has an extensive chronic pain history.  Per her report she has been on very strong high-dose narcotics in the past, and her pain persisted.  She reports a strong family history of substance abuse, and is not interested in narcotics.  She currently is not on any type of medications to help with her discomfort.    B12 deficiency and Vitamin D deficiency She reports compliance with B12 and vitamin D supplementation.    Type II diabetes mellitus with manifestations (HCC) She reports compliance with Jardiance 10 mg daily and is tolerating medication. She is unable  to take metformin with her IBS. She did try it again and she has had diarrhea sx.  She has chronic neuropathy. Patient denies dizziness, hyperglycemic or hypoglycemic events. Patient denies numbness, tingling in the extremities or nonhealing wounds of feet.    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  She is on lifelong anticoagulation therapy.  She reports compliance with Xarelto  Essential hypertension/Dyslipidemia, goal LDL below 70/CAD S/P PCI/History of non-ST elevation myocardial infarction (NSTEMI)//Morbid obesity (HCC)/Coronary artery disease involving native coronary artery of native heart with angina pectoris (HCC)/Aortic atherosclerosis (HCC) Managed by cardiology   Chronic bronchitis, unspecified chronic bronchitis type (HCC)/Moderate persistent asthma, unspecified whether complicated/Nocturnal oxygen desaturation/Obstructive sleep apnea (adult)  Managed by pulmonology     10/28/2023    1:59 PM 05/26/2023   10:54 AM 05/13/2023    2:37 PM 09/18/2022    2:51 PM 04/09/2022    8:49 AM  Depression screen PHQ 2/9  Decreased Interest 1 1 1  0 0  Down, Depressed, Hopeless 0 0 0 1 0  PHQ - 2 Score 1 1 1 1  0  Altered sleeping 2  1 0   Tired, decreased energy 2  1 1    Change in appetite 2  1 1    Feeling bad or failure about yourself  2  0 1   Trouble concentrating 2  0 0   Moving slowly or fidgety/restless 0  1 0   Suicidal thoughts 0  0 0   PHQ-9 Score 11  5 4    Difficult doing work/chores Somewhat  difficult  Somewhat difficult        10/28/2023    2:03 PM 05/13/2023    2:37 PM 09/18/2022    2:51 PM  GAD 7 : Generalized Anxiety Score  Nervous, Anxious, on Edge  0 0  Control/stop worrying 1 0 0  Worry too much - different things 2 0 0  Trouble relaxing 2 0 0  Restless 0 0 0  Easily annoyed or irritable 1 0 0  Afraid - awful might happen 1 0 0  Total GAD 7 Score  0 0  Anxiety Difficulty  Somewhat difficult             10/28/2023    1:59 PM 05/13/2023    2:36 PM 08/07/2022    8:43 AM 04/06/2022   11:07 AM 10/19/2021    3:19 PM  Fall Risk   Falls in the past year? 1 1 1 1 1   Number falls in past yr: 1 0 1 1 1   Injury with Fall? 0 0 1 1 0  Risk for fall due to : Impaired balance/gait Impaired mobility;Impaired balance/gait;History of fall(s) History of fall(s) History of fall(s) History of fall(s)  Follow up Falls evaluation completed Falls evaluation completed Falls evaluation completed Falls evaluation completed Falls evaluation completed   Immunization History  Administered Date(s) Administered   Fluad Quad(high Dose 65+) 05/08/2021, 08/07/2022   Fluad Trivalent(High Dose 65+) 05/13/2023   Influenza, Seasonal, Injecte, Preservative Fre 06/22/2015   Influenza,inj,quad, With Preservative 07/05/2016   Influenza-Unspecified 05/10/2013, 05/05/2014   MODERNA SARS COV-2 Pediatric Vaccination 6mos to <28yrs 08/12/2020, 04/02/2021   Moderna Sars-Covid-2 Vaccination 11/04/2019, 12/02/2019   PNEUMOCOCCAL CONJUGATE-20 04/05/2022   Pneumococcal Conjugate-13 08/26/2004, 10/27/2018   Pneumococcal Polysaccharide-23 10/27/2018    No results found.  Past Medical History:  Diagnosis Date   Acute and chronic respiratory failure with hypoxia (HCC) 01/24/2018   Acute diverticulitis 07/20/2022   Acute kidney injury superimposed on chronic kidney disease (HCC) 07/21/2022   Asthma    Blood in stool    Chicken pox    Chronic generalized abdominal pain    Chronic pain    CKD (chronic kidney disease), stage III (HCC)    Colon polyps    COPD (chronic obstructive pulmonary disease) (HCC)    COVID-19 2020   Depression    Diverticulitis    Very severe.  Has had 2 bowel resections and temporary colostomy in the past.   Fall at home, initial encounter 07/21/2022   Fecal incontinence    Frequent headaches    GERD (gastroesophageal reflux disease)    Heart attack (HCC)    Heart  disease    High anion gap metabolic acidosis 07/21/2022   History of blood transfusion    Hyperlipemia    Hypertension    Incontinence of feces 12/11/2016   Morbid obesity (HCC)    Pneumonia due to infectious organism 01/12/2018   Postmenopausal 03/21/2021   PTSD (post-traumatic stress disorder)    Recurrent deep vein thrombosis (DVT) (HCC)    x6   Rhabdomyolysis 07/21/2022   Urinary incontinence    Allergies  Allergen Reactions   Rofecoxib Anaphylaxis    VIOXX   Metformin And Related Other (See Comments)    Ibs    Sulfonylureas Other (See Comments)    Dizziness   Benadryl Allergy [Diphenhydramine Hcl] Other (See Comments)    Depressed respirations   Cephalexin Rash   Gatifloxacin Other (See Comments)    Confusion Very low blood pressure Teequinn  Past Surgical History:  Procedure Laterality Date   ABDOMINAL HYSTERECTOMY  1995   APPENDECTOMY     CATARACT EXTRACTION Bilateral    CESAREAN SECTION  1984   CHOLECYSTECTOMY  2016   COLOSTOMY     and reversal   CORONARY STENT INTERVENTION N/A 06/18/2019   Procedure: CORONARY STENT INTERVENTION;  Surgeon: Iran Ouch, MD;  Location: [facility];  Service: Cardiovascular;  Laterality: N/A;   ENDOMETRIAL ABLATION     HERNIA REPAIR  1985   1985 and 2016 (2016 ventral w/ incarceration) 33 surgeries   HYSTEROSCOPY WITH D & C     13   LAPAROSCOPIC LYSIS OF ADHESIONS     x2   LEFT HEART CATH AND CORONARY ANGIOGRAPHY N/A 06/18/2019   Procedure: LEFT HEART CATH AND CORONARY ANGIOGRAPHY;  Surgeon: Iran Ouch, MD;  Location: [facility];  Service: Cardiovascular;  Laterality: N/A;   LEFT HEART CATH AND CORONARY ANGIOGRAPHY N/A 11/30/2019   Procedure: LEFT HEART CATH AND CORONARY ANGIOGRAPHY;  Surgeon: Lennette Bihari, MD;  Location: [facility];  Service: Cardiovascular;  Laterality: N/A;   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   TONSILLECTOMY  1959   VEIN SURGERY     laser x2    Family History  Problem Relation Age of Onset   Depression Mother    Drug abuse Mother    Mental illness Mother    Learning disabilities Mother    Miscarriages / [place] Mother    Alcohol abuse Mother    Mental illness Father    Depression Father    Heart disease Father    Drug abuse Sister    Depression Sister    Asthma Sister    Alcohol abuse Sister    Early death Sister    Mental illness Son    Hypertension Son    Drug abuse Son    Depression Son    Asthma Son    Alcohol abuse Son    Clotting disorder Son    Heart disease Maternal Grandmother    Diabetes Maternal Grandmother    Arthritis Maternal Grandmother    COPD Maternal Grandmother    Parkinson's disease Maternal Grandmother    Heart disease Maternal Grandfather    Heart attack Maternal Grandfather    Early death Maternal Grandfather    COPD Maternal Grandfather    Heart disease Paternal Grandmother    Heart attack Paternal Grandmother    Depression Paternal Grandmother    Arthritis Paternal Grandmother    Breast cancer Paternal Grandmother    Heart disease Paternal Grandfather    Heart attack Paternal Grandfather    Early death Paternal Grandfather    COPD Paternal Grandfather    Mental illness Half-Brother    Learning disabilities Half-Brother    Hypertension Half-Brother    Hyperlipidemia Half-Brother    Drug abuse Half-Brother    Heart disease Half-Brother    Diabetes Half-Brother    Depression Half-Brother    Asthma Half-Brother    Alcohol abuse Half-Brother    Arthritis Half-Brother    Kidney disease Half-Brother    Testicular cancer Half-Brother    Diabetes Half-Sister    Mental illness Half-Sister    Learning disabilities Half-Sister    Drug abuse Half-Sister    Depression Half-Sister    Alcohol abuse Half-Sister    Thyroid cancer Half-Sister    Social History   Social History Narrative   Marital status/children/pets:    Education/employment: retired     -  wears seatbelt: Yes      - Feels safe in their relationships: Yes       Allergies as of 10/28/2023       Reactions   Rofecoxib Anaphylaxis   VIOXX   Metformin And Related Other (See Comments)   Ibs    Sulfonylureas Other (See Comments)   Dizziness   Benadryl Allergy [diphenhydramine Hcl] Other (See Comments)   Depressed respirations   Cephalexin Rash   Gatifloxacin Other (See Comments)   Confusion Very low blood pressure Teequinn        Medication List        Accurate as of October 28, 2023  2:56 PM. If you have any questions, ask your nurse or doctor.          STOP taking these medications    diphenoxylate-atropine 2.5-0.025 MG tablet Commonly known as: LOMOTIL Stopped by: Felix Pacini   metFORMIN 500 MG tablet Commonly known as: GLUCOPHAGE Stopped by: Felix Pacini       TAKE these medications    acetaminophen 500 MG tablet Commonly known as: TYLENOL Take 500-1,000 mg by mouth as needed for mild pain or moderate pain.   albuterol 108 (90 Base) MCG/ACT inhaler Commonly known as: VENTOLIN HFA Inhale 2 puffs into the lungs every 4 (four) hours as needed for wheezing or shortness of breath.   amitriptyline 75 MG tablet Commonly known as: ELAVIL Take 1 tablet (75 mg total) by mouth at bedtime. Started by: Felix Pacini   atorvastatin 80 MG tablet Commonly known as: LIPITOR Take 1 tablet (80 mg total) by mouth every evening.   carvedilol 6.25 MG tablet Commonly known as: COREG TAKE 1 TABLET BY MOUTH EVERY MORNING AND 2 TABLETS EVERY EVENING.   dicyclomine 20 MG tablet Commonly known as: BENTYL Take 1 tablet (20 mg total) by mouth 4 (four) times daily as needed for spasms.   empagliflozin 10 MG Tabs tablet Commonly known as: JARDIANCE Take 1 tablet (10 mg total) by mouth daily before breakfast.   famotidine 40 MG tablet Commonly known as: PEPCID Take 1 tablet (40 mg total) by mouth at bedtime.   furosemide 20 MG tablet Commonly known as: LASIX Take 1 tablet (20 mg  total) by mouth daily as needed. Started by: Felix Pacini   hydrOXYzine 25 MG tablet Commonly known as: ATARAX Take 1 tablet (25 mg total) by mouth every 8 (eight) hours as needed for itching.   ipratropium-albuterol 0.5-2.5 (3) MG/3ML Soln Commonly known as: DUONEB Take 3 mLs by nebulization every 4 (four) hours as needed.   isosorbide mononitrate 60 MG 24 hr tablet Commonly known as: IMDUR TAKE 1 TABLET(60 MG) BY MOUTH DAILY   magnesium oxide 400 (240 Mg) MG tablet Commonly known as: MAGnesium-Oxide Take 1 tablet (400 mg total) by mouth 2 (two) times daily.   nitroGLYCERIN 0.4 MG SL tablet Commonly known as: NITROSTAT Place 1 tablet (0.4 mg total) under the tongue every 5 (five) minutes as needed for chest pain.   psyllium 95 % Pack Commonly known as: HYDROCIL/METAMUCIL Take 1 packet by mouth daily.   rivaroxaban 20 MG Tabs tablet Commonly known as: Xarelto Take 1 tablet (20 mg total) by mouth daily with supper.   True Metrix Blood Glucose Test test strip Generic drug: glucose blood Use as instructed   TRUEplus Lancets 33G Misc 1 Stick by Does not apply route daily.   Vitamin D3 50 MCG (2000 UT) capsule Take 2 capsules (4,000 Units total) by mouth daily.  All past medical history, surgical history, allergies, family history, immunizations andmedications were updated in the EMR today and reviewed under the history and medication portions of their EMR.         ROS: 14 pt review of systems performed and negative (unless mentioned in an HPI)  Objective: BP 136/80   Pulse 88   Ht 5\' 2"  (1.575 m)   Wt 240 lb 12.8 oz (109.2 kg)   SpO2 93%   BMI 44.04 kg/m  Physical Exam Vitals and nursing note reviewed.  Constitutional:      General: She is not in acute distress.    Appearance: Normal appearance. She is not ill-appearing, toxic-appearing or diaphoretic.  HENT:     Head: Normocephalic and atraumatic.  Eyes:     General: No scleral icterus.        Right eye: No discharge.        Left eye: No discharge.     Extraocular Movements: Extraocular movements intact.     Conjunctiva/sclera: Conjunctivae normal.     Pupils: Pupils are equal, round, and reactive to light.  Cardiovascular:     Rate and Rhythm: Normal rate and regular rhythm.  Pulmonary:     Effort: Pulmonary effort is normal. No respiratory distress.     Breath sounds: Normal breath sounds. No wheezing, rhonchi or rales.  Musculoskeletal:     Right lower leg: No edema.     Left lower leg: No edema.  Skin:    General: Skin is warm.     Findings: No rash.  Neurological:     Mental Status: She is alert and oriented to person, place, and time. Mental status is at baseline.     Motor: No weakness.     Gait: Gait normal.  Psychiatric:        Mood and Affect: Mood normal.        Behavior: Behavior normal.        Thought Content: Thought content normal.        Judgment: Judgment normal.    Diabetic Foot Exam - Simple   Simple Foot Form Diabetic Foot exam was performed with the following findings: Yes 10/28/2023  2:12 PM  Visual Inspection No deformities, no ulcerations, no other skin breakdown bilaterally: Yes Sensation Testing See comments: Yes Pulse Check See comments: Yes Comments Chrinoc neuropathy Elongated toenails Decreased circulation     Assessment/plan: Mary Ferguson is a 73 y.o. female present for chronic condition management Chronic pain syndrome/Neuropathy/Paresthesia of bilateral legs/Compression fracture of L1 vertebra, initial encounter (HCC)/ Lumbar radiculopathy/IBS-like pain -She has reportedly been on many pain medicines in the past and did not find them helpful.  She is not interested in narcotics.  She has history of compression fraction and lumbar radiculopathy (L4-L5 region). I believe her pain is multifactorial.   -Consider Cymbalta-may be an option later. -Consider Lyrica-also may be an option later.   Increase Elavil 75 mg nightly.  She  understands not stop abruptly because she does not think it is working.  Hopefully this will help with neuropathic pain, depression and she may benefit with GI complaints. -NSAIDs contraindicated with her cardiac history and Xarelto use. -She is currently being managed with lower back pain by Ortho team with injections. -Referral to vascular and vein had been placed for her    B12 deficiency and Vitamin D deficiency Continue oral supplements .  GERD: Stable Continue Protonix Clonidine as needed  Type II diabetes mellitus with manifestations (HCC) A1c increased to  7.2 > 7.1> 7.2 >6.9> collected today Continue jardiance 10 mg every day.  Metformin has been tried twice and has caused by GI symptoms both times.   Sulfonylurea class not ideal with her gait instability and side effect of dizziness  Patient is to focus on her diet,.  Follow a diabetic diet. Pneumonia vaccination completed Ur microalb completed today 10/28/2023 Eye:[facility] eye exam 05/12/2024 Foot exam completed 04/2023-referred to podiatry for left large toenail podiatry referral placed again for her.  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  Essential hypertension/Dyslipidemia, goal LDL below 70/CAD S/P PCI/History of non-ST elevation myocardial infarction (NSTEMI)//Morbid obesity (HCC)/Coronary artery disease involving native coronary artery of native heart with angina pectoris (HCC)/Aortic atherosclerosis (HCC) Stable Medications are managed by cardiology-amlodipine, atorvastatin, carvedilol, Imdur, nitro, Xarelto Added back low-dose Lasix 20 mg daily as needed  Chronic bronchitis, unspecified chronic bronchitis type (HCC)/Moderate persistent asthma, unspecified whether complicated/Nocturnal oxygen desaturation/Obstructive sleep apnea (adult)  Patient is having fatigue with reported history of COPD, asthma, OSA and use of oxygen at night.   -  Ambulatory referral to Pulmonology placed prior> pt has not followed up since 07/2021> no showed appt x2   Return in about 24 weeks (around 04/13/2024).  Orders Placed This Encounter  Procedures   Urine Microalbumin w/creat. ratio   Comp Met (CMET)   Vitamin D (25 hydroxy)   CBC   Hemoglobin A1c   Magnesium   Ambulatory referral to Podiatry     Meds ordered this encounter  Medications   Cholecalciferol (VITAMIN D3) 50 MCG (2000 UT) capsule    Sig: Take 2 capsules (4,000 Units total) by mouth daily.    Dispense:  180 capsule    Refill:  3   MAGnesium-Oxide 400 (240 Mg) MG tablet    Sig: Take 1 tablet (400 mg total) by mouth 2 (two) times daily.    Dispense:  60 tablet    Refill:  3   hydrOXYzine (ATARAX) 25 MG tablet    Sig: Take 1 tablet (25 mg total) by mouth every 8 (eight) hours as needed for itching.    Dispense:  270 tablet    Refill:  1   famotidine (PEPCID) 40 MG tablet    Sig: Take 1 tablet (40 mg total) by mouth at bedtime.    Dispense:  90 tablet    Refill:  1   empagliflozin (JARDIANCE) 10 MG TABS tablet    Sig: Take 1 tablet (10 mg total) by mouth daily before breakfast.    Dispense:  90 tablet    Refill:  1   albuterol (VENTOLIN HFA) 108 (90 Base) MCG/ACT inhaler    Sig: Inhale 2 puffs into the lungs every 4 (four) hours as needed for wheezing or shortness of breath.    Dispense:  1 each    Refill:  3   furosemide (LASIX) 20 MG tablet    Sig: Take 1 tablet (20 mg total) by mouth daily as needed.    Dispense:  30 tablet    Refill:  3   amitriptyline (ELAVIL) 75 MG tablet    Sig: Take 1 tablet (75 mg total) by mouth at bedtime.    Dispense:  90 tablet    Refill:  1     Referral Orders         Ambulatory referral to Podiatry      Note is dictated utilizing voice recognition software. Although note has been proof read prior  to signing, occasional typographical errors still can be missed. If any questions arise, please do not hesitate to call for  verification.  Electronically signed by: Felix Pacini, DO [facility]

## 2023-10-28 NOTE — Patient Instructions (Addendum)
       Return in about 24 weeks (around 04/13/2024).        Great to see you today.  I have refilled the medication(s) we provide.   If labs were collected or images ordered, we will inform you of  results once we have received them and reviewed. We will contact you either by echart message, or telephone call.  Please give ample time to the testing facility, and our office to run,  receive and review results. Please do not call inquiring of results, even if you can see them in your chart. We will contact you as soon as we are able. If it has been over 1 week since the test was completed, and you have not yet heard from [place], then please call us.    - echart message- for normal results that have been seen by the patient already.   - telephone call: abnormal results or if patient has not viewed results in their echart.  If a referral to a specialist was entered for you, please call us in 2 weeks if you have not heard from the specialist office to schedule.

## 2023-10-29 LAB — VITAMIN D 25 HYDROXY (VIT D DEFICIENCY, FRACTURES): VITD: 16.14 ng/mL — ABNORMAL LOW (ref 30.00–100.00)

## 2023-10-29 LAB — COMPREHENSIVE METABOLIC PANEL
ALT: 11 U/L (ref 0–35)
AST: 15 U/L (ref 0–37)
Albumin: 4 g/dL (ref 3.5–5.2)
Alkaline Phosphatase: 102 U/L (ref 39–117)
BUN: 28 mg/dL — ABNORMAL HIGH (ref 6–23)
CO2: 26 meq/L (ref 19–32)
Calcium: 10.4 mg/dL (ref 8.4–10.5)
Chloride: 107 meq/L (ref 96–112)
Creatinine, Ser: 1.21 mg/dL — ABNORMAL HIGH (ref 0.40–1.20)
GFR: 44.93 mL/min — ABNORMAL LOW (ref >60.00)
Glucose, Bld: 113 mg/dL — ABNORMAL HIGH (ref 70–99)
Potassium: 4.7 meq/L (ref 3.5–5.1)
Sodium: 140 meq/L (ref 135–145)
Total Bilirubin: 0.5 mg/dL (ref 0.2–1.2)
Total Protein: 6.7 g/dL (ref 6.0–8.3)

## 2023-10-29 LAB — MAGNESIUM: Magnesium: 2.5 mg/dL (ref 1.5–2.5)

## 2023-10-29 LAB — CBC
HCT: 44.7 % (ref 36.0–46.0)
Hemoglobin: 14.3 g/dL (ref 12.0–15.0)
MCHC: 32.1 g/dL (ref 30.0–36.0)
MCV: 84.6 fl (ref 78.0–100.0)
Platelets: 286 10*3/uL (ref 150.0–400.0)
RBC: 5.28 Mil/uL — ABNORMAL HIGH (ref 3.87–5.11)
RDW: 16.4 % — ABNORMAL HIGH (ref 11.5–15.5)
WBC: 7.7 10*3/uL (ref 4.0–10.5)

## 2023-10-29 LAB — HEMOGLOBIN A1C: Hgb A1c MFr Bld: 7.1 % — ABNORMAL HIGH (ref 4.6–6.5)

## 2023-10-30 ENCOUNTER — Other Ambulatory Visit: Payer: Self-pay | Admitting: Family Medicine

## 2023-10-30 NOTE — Telephone Encounter (Signed)
 Copied from CRM [id number]. Topic: Clinical - Medication Refill >> Oct 30, 2023 10:04 AM Marica Otter wrote: Most Recent Primary Care Visit:  Provider: Felix Pacini A  Department: [facility]  Visit Type: OFFICE VISIT  Date: 10/28/2023  Medication: clotrimazole (CLOTRIMAZOLE ANTI-FUNGAL) 1 % cream  Has the patient contacted their pharmacy? Yes, contact provider (Agent: If no, request that the patient contact the pharmacy for the refill. If patient does not wish to contact the pharmacy document the reason why and proceed with request.) (Agent: If yes, when and what did the pharmacy advise?)  Is this the correct pharmacy for this prescription? Yes If no, delete pharmacy and type the correct one.  This is the patient's preferred pharmacy:  [id number] - [place], [place] - [address] [place] [address] Phone: [phone] Fax: [phone]   Has the prescription been filled recently? No  Is the patient out of the medication? Yes  Has the patient been seen for an appointment in the last year OR does the patient have an upcoming appointment? Yes  Can we respond through MyChart? No  Agent: Please be advised that Rx refills may take up to 3 business days. We ask that you follow-up with your pharmacy.

## 2023-10-31 ENCOUNTER — Telehealth: Payer: Self-pay | Admitting: Family Medicine

## 2023-10-31 NOTE — Telephone Encounter (Signed)
 Please contact patient or listed contact concerning her results Liver function and blood cell counts are normal. Kidney function is stable A1c increased from 6.9 to 7.1.  We will continue her diabetic medicines as they are for now.  If A1c increases again next visit, then we would need to consider an increase her Jardiance dosage if kidney function allows. Vitamin D levels are extremely low, lower than last time now at 16.  Would advise her to please make certain she is taking the 4000 units of vitamin D daily.  I had refilled this during her appointment recently.  I will recheck these levels again at her follow-up and 3-1/2-4 months and if levels are still low are going to need to increase vitamin D   Originally we had asked her to follow-up in 6 months-August, please cancel that appointment and have her follow-up in 3.5-4 months.  Thanks

## 2023-10-31 NOTE — Telephone Encounter (Signed)
 Pt given results/recommendations. Pt scheduled for 6/4.

## 2023-11-10 ENCOUNTER — Encounter: Payer: Self-pay | Admitting: Podiatry

## 2023-11-10 ENCOUNTER — Ambulatory Visit (INDEPENDENT_AMBULATORY_CARE_PROVIDER_SITE_OTHER): Admitting: Podiatry

## 2023-11-10 DIAGNOSIS — B351 Tinea unguium: Secondary | ICD-10-CM

## 2023-11-10 DIAGNOSIS — E1142 Type 2 diabetes mellitus with diabetic polyneuropathy: Secondary | ICD-10-CM | POA: Diagnosis not present

## 2023-11-10 DIAGNOSIS — M79675 Pain in left toe(s): Secondary | ICD-10-CM | POA: Diagnosis not present

## 2023-11-10 DIAGNOSIS — M79674 Pain in right toe(s): Secondary | ICD-10-CM

## 2023-11-10 NOTE — Progress Notes (Signed)
  Subjective:  Patient ID: Mary Ferguson, female    DOB: 1951-01-07,   MRN: [id number]  No chief complaint on file.   73 y.o. female presents for concern of thickened elongated and painful nails that are difficult to trim. Requesting to have them trimmed today. Relates burning and tingling in their feet. Patient is diabetic and last A1c was  Lab Results  Component Value Date   HGBA1C 7.1 (H) 10/28/2023   .   PCP:  Claiborne Billings, Renee A, DO    . Denies any other pedal complaints. Denies n/v/f/c.   Past Medical History:  Diagnosis Date   Acute and chronic respiratory failure with hypoxia (HCC) 01/24/2018   Acute diverticulitis 07/20/2022   Acute kidney injury superimposed on chronic kidney disease (HCC) 07/21/2022   Asthma    Blood in stool    Chicken pox    Chronic generalized abdominal pain    Chronic pain    CKD (chronic kidney disease), stage III (HCC)    Colon polyps    COPD (chronic obstructive pulmonary disease) (HCC)    COVID-19 2020   Depression    Diverticulitis    Very severe.  Has had 2 bowel resections and temporary colostomy in the past.   Fall at home, initial encounter 07/21/2022   Fecal incontinence    Frequent headaches    GERD (gastroesophageal reflux disease)    Heart attack (HCC)    Heart disease    High anion gap metabolic acidosis 07/21/2022   History of blood transfusion    Hyperlipemia    Hypertension    Incontinence of feces 12/11/2016   Morbid obesity (HCC)    Pneumonia due to infectious organism 01/12/2018   Postmenopausal 03/21/2021   PTSD (post-traumatic stress disorder)    Recurrent deep vein thrombosis (DVT) (HCC)    x6   Rhabdomyolysis 07/21/2022   Urinary incontinence     Objective:  Physical Exam: Vascular: DP/PT pulses 2/4 bilateral. CFT <3 seconds. Absent hair growth on digits. Edema noted to bilateral lower extremities. Xerosis noted bilaterally.  Skin. No lacerations or abrasions bilateral feet. Nails 1-5 bilateral  are thickened  discolored and elongated with subungual debris.  Musculoskeletal: MMT 5/5 bilateral lower extremities in DF, PF, Inversion and Eversion. Deceased ROM in DF of ankle joint.  Neurological: Sensation intact to light touch. Protective sensation diminished bilateral.    Assessment:   1. Pain due to onychomycosis of toenails of both feet   2. Type 2 diabetes mellitus with peripheral neuropathy (HCC)      Plan:  Patient was evaluated and treated and all questions answered. -Discussed and educated patient on diabetic foot care, especially with  regards to the vascular, neurological and musculoskeletal systems.  -Stressed the importance of good glycemic control and the detriment of not  controlling glucose levels in relation to the foot. -Discussed supportive shoes at all times and checking feet regularly.  -Mechanically debrided all nails 1-5 bilateral using sterile nail nipper and filed with dremel without incident  -Answered all patient questions -Patient to return  in 3 months for at risk foot care -Patient advised to call the office if any problems or questions arise in the meantime.   Louann Sjogren, DPM

## 2023-11-24 ENCOUNTER — Telehealth (HOSPITAL_COMMUNITY): Payer: Self-pay | Admitting: *Deleted

## 2023-11-24 NOTE — Telephone Encounter (Signed)
 Attempted to call patient regarding upcoming cardiac PET appointment. Left message on voicemail with name and callback number Johney Frame RN Navigator Cardiac Imaging [facility] [phone] Office  Advised to avoid caffeine and imdur prior to test

## 2023-11-25 ENCOUNTER — Encounter (HOSPITAL_COMMUNITY): Admission: RE | Admit: 2023-11-25 | Payer: Medicare Other | Source: Ambulatory Visit

## 2024-01-21 ENCOUNTER — Other Ambulatory Visit: Payer: Self-pay | Admitting: Family Medicine

## 2024-01-21 ENCOUNTER — Other Ambulatory Visit: Payer: Self-pay | Admitting: Cardiovascular Disease

## 2024-01-21 DIAGNOSIS — Z86718 Personal history of other venous thrombosis and embolism: Secondary | ICD-10-CM

## 2024-01-21 NOTE — Telephone Encounter (Signed)
 Prescription refill request for Xarelto  received.  Indication: DVT Last office visit: 07/02/23 Mary Ferguson)  Weight: 109.2kg Age: 73 Scr: 1.21 (10/28/23)  CrCl: 72.66ml/min  Appropriate dose. Refill sent.

## 2024-01-21 NOTE — Telephone Encounter (Unsigned)
 Copied from CRM [id number]. Topic: Clinical - Medication Refill >> Jan 21, 2024  3:50 PM Deaijah H wrote: Medication: MAGnesium -Oxide 400 (240 Mg) MG tablet (37mo supply)  Has the patient contacted their pharmacy? Yes (Agent: If no, request that the patient contact the pharmacy for the refill. If patient does not wish to contact the pharmacy document the reason why and proceed with request.) (Agent: If yes, when and what did the pharmacy advise?) - No refills left  This is the patient's preferred pharmacy:  [id number] - [place], [place] - [address] [place]  [address] Phone: [phone] Fax: [phone]  Is this the correct pharmacy for this prescription? Yes If no, delete pharmacy and type the correct one.   Has the prescription been filled recently? Yes  Is the patient out of the medication? Yes  Has the patient been seen for an appointment in the last year OR does the patient have an upcoming appointment? Yes  Can we respond through MyChart? No  Agent: Please be advised that Rx refills may take up to 3 business days. We ask that you follow-up with your pharmacy.

## 2024-01-22 MED ORDER — MAGNESIUM OXIDE -MG SUPPLEMENT 400 (240 MG) MG PO TABS: 400.0000 mg | ORAL_TABLET | Freq: Two times a day (BID) | ORAL | 0 refills | Status: DC

## 2024-01-23 ENCOUNTER — Telehealth: Payer: Self-pay | Admitting: Family Medicine

## 2024-01-23 ENCOUNTER — Other Ambulatory Visit: Payer: Self-pay

## 2024-01-23 MED ORDER — MAGNESIUM OXIDE -MG SUPPLEMENT 400 (240 MG) MG PO TABS: 400.0000 mg | ORAL_TABLET | Freq: Two times a day (BID) | ORAL | 0 refills | Status: DC

## 2024-01-23 MED ORDER — CLOTRIMAZOLE 1 % EX CREA: 1.0000 | TOPICAL_CREAM | Freq: Two times a day (BID) | CUTANEOUS | 1 refills | Status: DC

## 2024-01-23 NOTE — Telephone Encounter (Signed)
 90-day supply sent 5/30.

## 2024-01-23 NOTE — Telephone Encounter (Signed)
 Copied from CRM [id number]. Topic: Clinical - Prescription Issue >> Jan 23, 2024  3:34 PM Mary Ferguson wrote: Reason for CRM: Patient called said her provider typically sends a 90 day supply of MAGnesium -Oxide 400 (240 Mg) MG tablet [[id number]] to her pharmacy and we sent a 30 day supply and she would like for us  to send them a quantity change for a 90 day supply.

## 2024-01-28 ENCOUNTER — Ambulatory Visit (INDEPENDENT_AMBULATORY_CARE_PROVIDER_SITE_OTHER): Admitting: Family Medicine

## 2024-01-28 ENCOUNTER — Encounter: Payer: Self-pay | Admitting: Family Medicine

## 2024-01-28 VITALS — BP 134/84 | HR 93 | Temp 98.3°F | Wt 247.2 lb

## 2024-01-28 DIAGNOSIS — E118 Type 2 diabetes mellitus with unspecified complications: Secondary | ICD-10-CM | POA: Diagnosis not present

## 2024-01-28 DIAGNOSIS — Z7984 Long term (current) use of oral hypoglycemic drugs: Secondary | ICD-10-CM

## 2024-01-28 DIAGNOSIS — E559 Vitamin D deficiency, unspecified: Secondary | ICD-10-CM

## 2024-01-28 DIAGNOSIS — E785 Hyperlipidemia, unspecified: Secondary | ICD-10-CM | POA: Diagnosis not present

## 2024-01-28 DIAGNOSIS — N1831 Chronic kidney disease, stage 3a: Secondary | ICD-10-CM

## 2024-01-28 DIAGNOSIS — G894 Chronic pain syndrome: Secondary | ICD-10-CM

## 2024-01-28 DIAGNOSIS — I10 Essential (primary) hypertension: Secondary | ICD-10-CM

## 2024-01-28 DIAGNOSIS — S0990XA Unspecified injury of head, initial encounter: Secondary | ICD-10-CM

## 2024-01-28 DIAGNOSIS — R270 Ataxia, unspecified: Secondary | ICD-10-CM

## 2024-01-28 DIAGNOSIS — I252 Old myocardial infarction: Secondary | ICD-10-CM

## 2024-01-28 NOTE — Progress Notes (Unsigned)
 Patient ID: Tashawn Greff, female  DOB: 01/15/1951, 73 y.o.   MRN: [id number] Patient Care Team    Relationship Specialty Notifications Start End  Mariel Shope, DO PCP - General Family Medicine  07/20/22   Luana Rumple, MD PCP - Cardiology Cardiology  07/02/23   Luana Rumple, MD Consulting Physician Cardiology  03/21/21   Amedeo Jupiter, MD Consulting Physician Ophthalmology  03/21/21   Lansing Planas, MD Referring Physician Surgery  03/21/21   Myrtie Atkinson, MD Referring Physician Neurology  03/21/21   Millicent Ally, MD Consulting Physician Cardiology  03/21/21   Georgeanne King., MD Physician Assistant Sports Medicine  03/21/21   Angel Barba, MD Referring Physician Dermatology  03/21/21   Margaretann Sharper, MD Consulting Physician Pulmonary Disease  08/07/22     Chief Complaint  Patient presents with   Diabetes    Subjective: Krystalyn Kubota is a 73 y.o.  female present for follow-up on [facility] Chronic generalized abdominal pain/Chronic pain syndrome/Neuropathy/Paresthesia of bilateral legs/Compression fracture of L1 vertebra, initial encounter (HCC)/ Lumbar radiculopathy Patient has noticed fair amount of improvement  with her neuropathy with amitriptyline  50 mg nightly. She denies oversedation with the amitriptyline .  She would like to to remain on medication Prior note: Patient has an extensive chronic pain history.  Per her report she has been on very strong high-dose narcotics in the past, and her pain persisted.  She reports a strong family history of substance abuse, and is not interested in narcotics.  She currently is not on any type of medications to help with her discomfort.    B12 deficiency and Vitamin D  deficiency She reports compliance with B12 and vitamin D  supplementation.    Type II diabetes mellitus with manifestations (HCC) She reports compliance with Jardiance  10 mg daily and is tolerating medication. She is unable to take metformin  with her  IBS. She did try it again and she has had diarrhea sx.  She has chronic neuropathy. Patient denies dizziness, hyperglycemic or hypoglycemic events. Patient denies numbness, tingling in the extremities or nonhealing wounds of feet.    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  She is on lifelong anticoagulation therapy.  She reports compliance with Xarelto   Essential hypertension/Dyslipidemia, goal LDL below 70/CAD S/P PCI/History of non-ST elevation myocardial infarction (NSTEMI)//Morbid obesity (HCC)/Coronary artery disease involving native coronary artery of native heart with angina pectoris (HCC)/Aortic atherosclerosis (HCC) Managed by cardiology   Chronic bronchitis, unspecified chronic bronchitis type (HCC)/Moderate persistent asthma, unspecified whether complicated/Nocturnal oxygen desaturation/Obstructive sleep apnea (adult)  Managed by pulmonology     10/28/2023    1:59 PM 05/26/2023   10:54 AM 05/13/2023    2:37 PM 09/18/2022    2:51 PM 04/09/2022    8:49 AM  Depression screen PHQ 2/9  Decreased Interest 1 1 1  0 0  Down, Depressed, Hopeless 0 0 0 1 0  PHQ - 2 Score 1 1 1 1  0  Altered sleeping 2  1 0   Tired, decreased energy 2  1 1    Change in appetite 2  1 1    Feeling bad or failure about yourself  2  0 1   Trouble concentrating 2  0 0   Moving slowly or fidgety/restless 0  1 0   Suicidal thoughts 0  0 0   PHQ-9 Score 11  5 4    Difficult doing work/chores Somewhat difficult  Somewhat difficult  10/28/2023    2:03 PM 05/13/2023    2:37 PM 09/18/2022    2:51 PM  GAD 7 : Generalized Anxiety Score  Nervous, Anxious, on Edge  0 0  Control/stop worrying 1 0 0  Worry too much - different things 2 0 0  Trouble relaxing 2 0 0  Restless 0 0 0  Easily annoyed or irritable 1 0 0  Afraid - awful might happen 1 0 0  Total GAD 7 Score  0 0  Anxiety Difficulty Somewhat difficult              10/28/2023    1:59 PM 05/13/2023    2:36 PM 08/07/2022    8:43 AM 04/06/2022   11:07 AM 10/19/2021    3:19 PM  Fall Risk   Falls in the past year? 1 1 1 1 1   Number falls in past yr: 1 0 1 1 1   Injury with Fall? 0 0 1 1 0  Risk for fall due to : Impaired balance/gait Impaired mobility;Impaired balance/gait;History of fall(s) History of fall(s) History of fall(s) History of fall(s)  Follow up Falls evaluation completed Falls evaluation completed Falls evaluation completed Falls evaluation completed Falls evaluation completed   Immunization History  Administered Date(s) Administered   Fluad Quad(high Dose 65+) 05/08/2021, 08/07/2022   Fluad Trivalent(High Dose 65+) 05/13/2023   Influenza, Seasonal, Injecte, Preservative Fre 06/22/2015   Influenza,inj,quad, With Preservative 07/05/2016   Influenza-Unspecified 05/10/2013, 05/05/2014   MODERNA SARS COV-2 Pediatric Vaccination 6mos to <26yrs 08/12/2020, 04/02/2021   Moderna Sars-Covid-2 Vaccination 11/04/2019, 12/02/2019   PNEUMOCOCCAL CONJUGATE-20 04/05/2022   Pneumococcal Conjugate-13 08/26/2004, 10/27/2018   Pneumococcal Polysaccharide-23 10/27/2018    No results found.  Past Medical History:  Diagnosis Date   Acute and chronic respiratory failure with hypoxia (HCC) 01/24/2018   Acute diverticulitis 07/20/2022   Acute kidney injury superimposed on chronic kidney disease (HCC) 07/21/2022   Asthma    Blood in stool    Chicken pox    Chronic generalized abdominal pain    Chronic pain    CKD (chronic kidney disease), stage III (HCC)    Colon polyps    COPD (chronic obstructive pulmonary disease) (HCC)    COVID-19 2020   Depression    Diverticulitis    Very severe.  Has had 2 bowel resections and temporary colostomy in the past.   Fall at home, initial encounter 07/21/2022   Fecal incontinence    Frequent headaches    GERD (gastroesophageal reflux disease)    Heart attack (HCC)    Heart disease    High anion gap metabolic  acidosis 07/21/2022   History of blood transfusion    Hyperlipemia    Hypertension    Incontinence of feces 12/11/2016   Morbid obesity (HCC)    Pneumonia due to infectious organism 01/12/2018   Postmenopausal 03/21/2021   PTSD (post-traumatic stress disorder)    Recurrent deep vein thrombosis (DVT) (HCC)    x6   Rhabdomyolysis 07/21/2022   Urinary incontinence    Allergies  Allergen Reactions   Rofecoxib Anaphylaxis    VIOXX   Metformin  And Related Other (See Comments)    Ibs    Sulfonylureas Other (See Comments)    Dizziness   Benadryl Allergy [Diphenhydramine Hcl] Other (See Comments)    Depressed respirations   Cephalexin Rash   Gatifloxacin Other (See Comments)    Confusion Very low blood pressure Teequinn   Past Surgical History:  Procedure Laterality Date   ABDOMINAL HYSTERECTOMY  1995   APPENDECTOMY     CATARACT EXTRACTION Bilateral    CESAREAN SECTION  1984   CHOLECYSTECTOMY  2016   COLOSTOMY     and reversal   CORONARY STENT INTERVENTION N/A 06/18/2019   Procedure: CORONARY STENT INTERVENTION;  Surgeon: Wenona Hamilton, MD;  Location: [facility];  Service: Cardiovascular;  Laterality: N/A;   ENDOMETRIAL ABLATION     HERNIA REPAIR  1985   1985 and 2016 (2016 ventral w/ incarceration) 33 surgeries   HYSTEROSCOPY WITH D & C     13   LAPAROSCOPIC LYSIS OF ADHESIONS     x2   LEFT HEART CATH AND CORONARY ANGIOGRAPHY N/A 06/18/2019   Procedure: LEFT HEART CATH AND CORONARY ANGIOGRAPHY;  Surgeon: Wenona Hamilton, MD;  Location: [facility];  Service: Cardiovascular;  Laterality: N/A;   LEFT HEART CATH AND CORONARY ANGIOGRAPHY N/A 11/30/2019   Procedure: LEFT HEART CATH AND CORONARY ANGIOGRAPHY;  Surgeon: Millicent Ally, MD;  Location: [facility];  Service: Cardiovascular;  Laterality: N/A;   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, 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   TONSILLECTOMY  1959   VEIN SURGERY     laser x2   Family History  Problem Relation Age  of Onset   Depression Mother    Drug abuse Mother    Mental illness Mother    Learning disabilities Mother    Miscarriages / [place] Mother    Alcohol abuse Mother    Mental illness Father    Depression Father    Heart disease Father    Drug abuse Sister    Depression Sister    Asthma Sister    Alcohol abuse Sister    Early death Sister    Mental illness Son    Hypertension Son    Drug abuse Son    Depression Son    Asthma Son    Alcohol abuse Son    Clotting disorder Son    Heart disease Maternal Grandmother    Diabetes Maternal Grandmother    Arthritis Maternal Grandmother    COPD Maternal Grandmother    Parkinson's disease Maternal Grandmother    Heart disease Maternal Grandfather    Heart attack Maternal Grandfather    Early death Maternal Grandfather    COPD Maternal Grandfather    Heart disease Paternal Grandmother    Heart attack Paternal Grandmother    Depression Paternal Grandmother    Arthritis Paternal Grandmother    Breast cancer Paternal Grandmother    Heart disease Paternal Grandfather    Heart attack Paternal Grandfather    Early death Paternal Grandfather    COPD Paternal Grandfather    Mental illness Half-Brother    Learning disabilities Half-Brother    Hypertension Half-Brother    Hyperlipidemia Half-Brother    Drug abuse Half-Brother    Heart disease Half-Brother    Diabetes Half-Brother    Depression Half-Brother    Asthma Half-Brother    Alcohol abuse Half-Brother    Arthritis Half-Brother    Kidney disease Half-Brother    Testicular cancer Half-Brother    Diabetes Half-Sister    Mental illness Half-Sister    Learning disabilities Half-Sister    Drug abuse Half-Sister    Depression Half-Sister    Alcohol abuse Half-Sister    Thyroid  cancer Half-Sister    Social History   Social History Narrative   Marital status/children/pets:    Education/employment: retired     - wears seatbelt: Yes     -  Feels safe in their relationships:  Yes       Allergies as of 01/28/2024       Reactions   Rofecoxib Anaphylaxis   VIOXX   Metformin  And Related Other (See Comments)   Ibs    Sulfonylureas Other (See Comments)   Dizziness   Benadryl Allergy [diphenhydramine Hcl] Other (See Comments)   Depressed respirations   Cephalexin Rash   Gatifloxacin Other (See Comments)   Confusion Very low blood pressure Teequinn        Medication List        Accurate as of January 28, 2024 11:59 PM. If you have any questions, ask your nurse or doctor.          acetaminophen  500 MG tablet Commonly known as: TYLENOL  Take 500-1,000 mg by mouth as needed for mild pain or moderate pain.   albuterol  108 (90 Base) MCG/ACT inhaler Commonly known as: VENTOLIN  HFA Inhale 2 puffs into the lungs every 4 (four) hours as needed for wheezing or shortness of breath.   amitriptyline  75 MG tablet Commonly known as: ELAVIL  Take 1 tablet (75 mg total) by mouth at bedtime.   atorvastatin  80 MG tablet Commonly known as: LIPITOR  Take 1 tablet (80 mg total) by mouth every evening.   carvedilol  6.25 MG tablet Commonly known as: COREG  TAKE 1 TABLET BY MOUTH EVERY MORNING AND 2 TABLETS EVERY EVENING.   clotrimazole  1 % cream Commonly known as: LOTRIMIN  Apply 1 Application topically 2 (two) times daily.   dicyclomine  20 MG tablet Commonly known as: BENTYL  Take 1 tablet (20 mg total) by mouth 4 (four) times daily as needed for spasms.   empagliflozin  10 MG Tabs tablet Commonly known as: JARDIANCE  Take 1 tablet (10 mg total) by mouth daily before breakfast.   famotidine  40 MG tablet Commonly known as: PEPCID  Take 1 tablet (40 mg total) by mouth at bedtime.   furosemide  20 MG tablet Commonly known as: LASIX  Take 1 tablet (20 mg total) by mouth daily as needed.   hydrOXYzine  25 MG tablet Commonly known as: ATARAX  Take 1 tablet (25 mg total) by mouth every 8 (eight) hours as needed for itching.   ipratropium-albuterol  0.5-2.5 (3) MG/3ML  Soln Commonly known as: DUONEB Take 3 mLs by nebulization every 4 (four) hours as needed.   isosorbide  mononitrate 60 MG 24 hr tablet Commonly known as: IMDUR  TAKE 1 TABLET(60 MG) BY MOUTH DAILY   magnesium  oxide 400 (240 Mg) MG tablet Commonly known as: MAGnesium -Oxide Take 1 tablet (400 mg total) by mouth 2 (two) times daily.   nitroGLYCERIN  0.4 MG SL tablet Commonly known as: NITROSTAT  Place 1 tablet (0.4 mg total) under the tongue every 5 (five) minutes as needed for chest pain.   psyllium 95 % Pack Commonly known as: HYDROCIL/METAMUCIL Take 1 packet by mouth daily.   True Metrix Blood Glucose Test test strip Generic drug: glucose blood Use as instructed   TRUEplus Lancets 33G Misc 1 Stick by Does not apply route daily.   Vitamin D3 50 MCG (2000 UT) capsule Take 2 capsules (4,000 Units total) by mouth daily.   Xarelto  20 MG Tabs tablet Generic drug: rivaroxaban  TAKE 1 TABLET(20 MG) BY MOUTH DAILY WITH SUPPER        All past medical history, surgical history, allergies, family history, immunizations andmedications were updated in the EMR today and reviewed under the history and medication portions of their EMR.         ROS: 14 pt review  of systems performed and negative (unless mentioned in an HPI)  Objective: BP 134/84   Pulse 93   Temp 98.3 F (36.8 C)   Wt 247 lb 3.2 oz (112.1 kg)   SpO2 95%   BMI 45.21 kg/m  Physical Exam Vitals and nursing note reviewed.  Constitutional:      General: She is not in acute distress.    Appearance: Normal appearance. She is not ill-appearing, toxic-appearing or diaphoretic.  HENT:     Head: Normocephalic and atraumatic.  Eyes:     General: No scleral icterus.       Right eye: No discharge.        Left eye: No discharge.     Extraocular Movements: Extraocular movements intact.     Conjunctiva/sclera: Conjunctivae normal.     Pupils: Pupils are equal, round, and reactive to light.  Cardiovascular:     Rate and  Rhythm: Normal rate and regular rhythm.  Pulmonary:     Effort: Pulmonary effort is normal. No respiratory distress.     Breath sounds: Normal breath sounds. No wheezing, rhonchi or rales.  Musculoskeletal:     Right lower leg: No edema.     Left lower leg: No edema.  Skin:    General: Skin is warm.     Findings: No rash.  Neurological:     Mental Status: She is alert and oriented to person, place, and time. Mental status is at baseline.     Motor: No weakness.     Gait: Gait normal.  Psychiatric:        Mood and Affect: Mood normal.        Behavior: Behavior normal.        Thought Content: Thought content normal.        Judgment: Judgment normal.    Diabetic Foot Exam - Simple   No data filed     Assessment/plan: Elia Nunley is a 73 y.o. female present for chronic condition management Chronic pain syndrome/Neuropathy/Paresthesia of bilateral legs/Compression fracture of L1 vertebra, initial encounter (HCC)/ Lumbar radiculopathy/IBS-like pain -She has reportedly been on many pain medicines in the past and did not find them helpful.  She is not interested in narcotics.  She has history of compression fraction and lumbar radiculopathy (L4-L5 region). I believe her pain is multifactorial.   -Consider Cymbalta -may be an option later. -Consider Lyrica-also may be an option later.   Increase Elavil  75 mg nightly.  She understands not stop abruptly because she does not think it is working.  Hopefully this will help with neuropathic pain, depression and she may benefit with GI complaints. -NSAIDs contraindicated with her cardiac history and Xarelto  use. -She is currently being managed with lower back pain by Ortho team with injections. -Referral to vascular and vein had been placed for her    B12 deficiency and Vitamin D  deficiency Continue oral supplements . Vitamin D  levels collected today  GERD: Stable Continue Protonix   Type II diabetes mellitus with manifestations  (HCC) A1c increased to 7.2 > 7.1> 7.2 >6.9>7.1> collected today Continue jardiance  10 mg every day.  Metformin  has been tried twice and has caused by GI symptoms both times.   Sulfonylurea class not ideal with her gait instability and side effect of dizziness  Patient is to focus on her diet,.  Follow a diabetic diet. Pneumonia vaccination completed Ur microalb completed today 10/28/2023 Eye:[facility] eye exam 05/12/2024 Foot exam completed 04/2023-referred to podiatry for left large toenail podiatry referral placed again  for her.  History of recurrent deep vein thrombosis (DVT)/Acquired thrombophilia (HCC) Lifelong anticoagulation therapy with Xarelto .  Essential hypertension/Dyslipidemia, goal LDL below 70/CAD S/P PCI/History of non-ST elevation myocardial infarction (NSTEMI)//Morbid obesity (HCC)/Coronary artery disease involving native coronary artery of native heart with angina pectoris (HCC)/Aortic atherosclerosis (HCC) Stable Medications are managed by cardiology-amlodipine , atorvastatin , carvedilol , Imdur , nitro, Xarelto  Added back low-dose Lasix  20 mg daily as needed  Chronic bronchitis, unspecified chronic bronchitis type (HCC)/Moderate persistent asthma, unspecified whether complicated/Nocturnal oxygen desaturation/Obstructive sleep apnea (adult)  Patient is having fatigue with reported history of COPD, asthma, OSA and use of oxygen at night.   - Ambulatory referral to Pulmonology placed prior> pt has not followed up since 07/2021> no showed appt x2 Fall/head injury/gait instability-ataxia She reports a fall that occurred about 2 weeks ago in which she hit her head and since then she "just does not feel".  Patient and her family members state that she is not as steady on her feet since having the fall.  Patient reports feeling woozy.  She is on chronic Xarelto  MRI brain ordered  Return in about 24 weeks (around 07/14/2024).  Orders Placed This Encounter  Procedures   MR Brain Wo  Contrast   Hemoglobin A1c   Vitamin D  (25 hydroxy)     No orders of the defined types were placed in this encounter.    Referral Orders  No referral(s) requested today     Note is dictated utilizing voice recognition software. Although note has been proof read prior to signing, occasional typographical errors still can be missed. If any questions arise, please do not hesitate to call for verification.  Electronically signed by: Napolean Backbone, DO [facility] [place]

## 2024-01-28 NOTE — Patient Instructions (Signed)

## 2024-01-29 LAB — VITAMIN D 25 HYDROXY (VIT D DEFICIENCY, FRACTURES): VITD: 31.4 ng/mL (ref 30.00–100.00)

## 2024-01-29 LAB — HEMOGLOBIN A1C: Hgb A1c MFr Bld: 7 % — ABNORMAL HIGH (ref 4.6–6.5)

## 2024-01-30 ENCOUNTER — Ambulatory Visit: Payer: Self-pay | Admitting: Family Medicine

## 2024-01-30 ENCOUNTER — Encounter: Payer: Self-pay | Admitting: Cardiovascular Disease

## 2024-02-02 ENCOUNTER — Telehealth (HOSPITAL_BASED_OUTPATIENT_CLINIC_OR_DEPARTMENT_OTHER): Payer: Self-pay

## 2024-02-04 ENCOUNTER — Telehealth (HOSPITAL_BASED_OUTPATIENT_CLINIC_OR_DEPARTMENT_OTHER): Payer: Self-pay

## 2024-02-12 ENCOUNTER — Ambulatory Visit (INDEPENDENT_AMBULATORY_CARE_PROVIDER_SITE_OTHER): Admitting: Podiatry

## 2024-02-12 DIAGNOSIS — Z91199 Patient's noncompliance with other medical treatment and regimen due to unspecified reason: Secondary | ICD-10-CM

## 2024-02-12 NOTE — Progress Notes (Signed)
 Cancel 24 hours

## 2024-02-19 ENCOUNTER — Encounter: Payer: Self-pay | Admitting: Podiatry

## 2024-02-19 ENCOUNTER — Ambulatory Visit: Admitting: Podiatry

## 2024-02-19 DIAGNOSIS — E1142 Type 2 diabetes mellitus with diabetic polyneuropathy: Secondary | ICD-10-CM

## 2024-02-19 DIAGNOSIS — M79675 Pain in left toe(s): Secondary | ICD-10-CM

## 2024-02-19 DIAGNOSIS — B351 Tinea unguium: Secondary | ICD-10-CM | POA: Diagnosis not present

## 2024-02-19 DIAGNOSIS — M79674 Pain in right toe(s): Secondary | ICD-10-CM | POA: Diagnosis not present

## 2024-02-19 NOTE — Progress Notes (Signed)
  Subjective:  Patient ID: Mary Ferguson, female    DOB: 10/29/1950,   MRN: [id number]  Chief Complaint  Patient presents with   Diabetes    Cut my toenails.  Saw Dr. Charlies Bellini - 01/28/2024; A1c - 7.0    73 y.o. female presents for concern of thickened elongated and painful nails that are difficult to trim. Requesting to have them trimmed today. Relates burning and tingling in their feet. Patient is diabetic and last A1c was  Lab Results  Component Value Date   HGBA1C 7.0 (H) 01/28/2024   .   PCP:  Bellini, Renee A, DO    . Denies any other pedal complaints. Denies n/v/f/c.   Past Medical History:  Diagnosis Date   Acute and chronic respiratory failure with hypoxia (HCC) 01/24/2018   Acute diverticulitis 07/20/2022   Acute kidney injury superimposed on chronic kidney disease (HCC) 07/21/2022   Asthma    Blood in stool    Chicken pox    Chronic generalized abdominal pain    Chronic pain    CKD (chronic kidney disease), stage III (HCC)    Colon polyps    COPD (chronic obstructive pulmonary disease) (HCC)    COVID-19 2020   Depression    Diverticulitis    Very severe.  Has had 2 bowel resections and temporary colostomy in the past.   Fall at home, initial encounter 07/21/2022   Fecal incontinence    Frequent headaches    GERD (gastroesophageal reflux disease)    Heart attack (HCC)    Heart disease    High anion gap metabolic acidosis 07/21/2022   History of blood transfusion    Hyperlipemia    Hypertension    Incontinence of feces 12/11/2016   Morbid obesity (HCC)    Pneumonia due to infectious organism 01/12/2018   Postmenopausal 03/21/2021   PTSD (post-traumatic stress disorder)    Recurrent deep vein thrombosis (DVT) (HCC)    x6   Rhabdomyolysis 07/21/2022   Urinary incontinence     Objective:  Physical Exam: Vascular: DP/PT pulses 2/4 bilateral. CFT <3 seconds. Absent hair growth on digits. Edema noted to bilateral lower extremities. Xerosis noted  bilaterally.  Skin. No lacerations or abrasions bilateral feet. Nails 1-5 bilateral  are thickened discolored and elongated with subungual debris.  Musculoskeletal: MMT 5/5 bilateral lower extremities in DF, PF, Inversion and Eversion. Deceased ROM in DF of ankle joint.  Neurological: Sensation intact to light touch. Protective sensation diminished bilateral.    Assessment:   1. Pain due to onychomycosis of toenails of both feet   2. Type 2 diabetes mellitus with peripheral neuropathy (HCC)      Plan:  Patient was evaluated and treated and all questions answered. -Discussed and educated patient on diabetic foot care, especially with  regards to the vascular, neurological and musculoskeletal systems.  -Stressed the importance of good glycemic control and the detriment of not  controlling glucose levels in relation to the foot. -Discussed supportive shoes at all times and checking feet regularly.  -Mechanically debrided all nails 1-5 bilateral using sterile nail nipper and filed with dremel without incident  -Answered all patient questions -Patient to return  in 3 months for at risk foot care -Patient advised to call the office if any problems or questions arise in the meantime.   Asberry Failing, DPM

## 2024-04-14 ENCOUNTER — Ambulatory Visit: Admitting: Family Medicine

## 2024-04-28 ENCOUNTER — Telehealth: Payer: Self-pay | Admitting: Cardiovascular Disease

## 2024-04-28 ENCOUNTER — Other Ambulatory Visit: Payer: Self-pay

## 2024-04-28 MED ORDER — CLOTRIMAZOLE 1 % EX CREA: 1.0000 | TOPICAL_CREAM | Freq: Two times a day (BID) | CUTANEOUS | 5 refills | Status: DC

## 2024-04-28 MED ORDER — AMITRIPTYLINE HCL 75 MG PO TABS: 75.0000 mg | ORAL_TABLET | Freq: Every day | ORAL | 0 refills | Status: DC

## 2024-04-28 MED ORDER — EMPAGLIFLOZIN 10 MG PO TABS: 10.0000 mg | ORAL_TABLET | Freq: Every day | ORAL | 0 refills | Status: DC

## 2024-04-28 MED ORDER — MAGNESIUM OXIDE -MG SUPPLEMENT 400 (240 MG) MG PO TABS
400.0000 mg | ORAL_TABLET | Freq: Two times a day (BID) | ORAL | 0 refills | Status: DC
Start: 2024-04-28 — End: 2024-07-06

## 2024-04-28 MED ORDER — DICYCLOMINE HCL 20 MG PO TABS: 20.0000 mg | ORAL_TABLET | Freq: Four times a day (QID) | ORAL | 0 refills | Status: DC | PRN

## 2024-04-28 MED ORDER — VITAMIN D3 50 MCG (2000 UT) PO CAPS: 4000.0000 [IU] | ORAL_CAPSULE | Freq: Every day | ORAL | 0 refills | Status: DC

## 2024-04-28 MED ORDER — FAMOTIDINE 40 MG PO TABS: 40.0000 mg | ORAL_TABLET | Freq: Every day | ORAL | 0 refills | Status: DC

## 2024-04-28 MED ORDER — FUROSEMIDE 20 MG PO TABS: 20.0000 mg | ORAL_TABLET | Freq: Every day | ORAL | 0 refills | Status: DC | PRN

## 2024-04-28 NOTE — Telephone Encounter (Signed)
*  STAT* If patient is at the pharmacy, call can be transferred to refill team.  1. Which medications need to be refilled? (please list name of each medication and dose if known)   atorvastatin  (LIPITOR ) 80 MG tablet  carvedilol  (COREG ) 6.25 MG tablet  isosorbide  mononitrate (IMDUR ) 60 MG 24 hr tablet  XARELTO  20 MG TABS tablet  nitroGLYCERIN  (NITROSTAT ) 0.4 MG SL tablet   2. Would you like to learn more about the convenience, safety, & potential cost savings by using the [facility]?   3. Are you open to using the [facility] (Type [facility]. ).  4. Which pharmacy/location (including street and city if local pharmacy) is medication to be sent to?  [id number] - [place], [place] - [address]   5. Do they need a 30 day or 90 day supply?   90 day  Patient stated she is completely out of these medications.  Patient has an appointment with Dr. Francyne on 11/25.  Patient wants a call back to confirm prescriptions has been sent.

## 2024-04-28 NOTE — Telephone Encounter (Signed)
 RX's sent to Pharmacy. Enough to get to appt in 06/2024. Pharmacy confirmed. Spoke to [employer] and advised him of this. Pt's son verbalized understanding.

## 2024-04-30 ENCOUNTER — Telehealth: Payer: Self-pay

## 2024-04-30 NOTE — Telephone Encounter (Signed)
 Promethazine  has never been prescribed by this provider for this patient. Promethazine  has not been prescribed to this patient since 2022,  even by other providers.  Patient would need to be seen by this provider, for the reason she is requesting the promethazine  to be prescribed.  Although promethazine  is not a medication I would recommend for routine use, there may be another medication we could provide for her to help her with her symptoms-but would need to evaluate her.

## 2024-04-30 NOTE — Telephone Encounter (Signed)
 Communication  Reason for CRM: Miranda with [facility] called in stating that there has been a lot of back and forth in regards to the patient's medication, promethazine . Agent could not locate this medication on patient's med list, but Cena would like to speak with someone in office about it, as the patient stated this has been prescribed to her from her PCP.   Pt is requesting refill on Promethazine . Med previously discontinued.

## 2024-05-03 NOTE — Telephone Encounter (Signed)
 Spoke with pt and discussed recommendations. Pt understood and stated she would have to wait to make an appointment due to her son being her only form of transportation. Pt stated she would callback sometime this week to schedule.

## 2024-05-27 ENCOUNTER — Ambulatory Visit: Admitting: Podiatry

## 2024-05-27 ENCOUNTER — Encounter: Payer: Self-pay | Admitting: Podiatry

## 2024-05-27 DIAGNOSIS — M79674 Pain in right toe(s): Secondary | ICD-10-CM

## 2024-05-27 DIAGNOSIS — E1142 Type 2 diabetes mellitus with diabetic polyneuropathy: Secondary | ICD-10-CM | POA: Diagnosis not present

## 2024-05-27 DIAGNOSIS — M79675 Pain in left toe(s): Secondary | ICD-10-CM | POA: Diagnosis not present

## 2024-05-27 DIAGNOSIS — B351 Tinea unguium: Secondary | ICD-10-CM

## 2024-05-27 NOTE — Progress Notes (Signed)
  Subjective:  Patient ID: Carmelia Last, female    DOB: Jul 31, 1951,   MRN: [id number]  No chief complaint on file.   73 y.o. female presents for concern of thickened elongated and painful nails that are difficult to trim. Requesting to have them trimmed today. Relates burning and tingling in their feet. Patient is diabetic and last A1c was  Lab Results  Component Value Date   HGBA1C 7.0 (H) 01/28/2024   .   PCP:  Catherine, Renee A, DO    . Denies any other pedal complaints. Denies n/v/f/c.   Past Medical History:  Diagnosis Date   Acute and chronic respiratory failure with hypoxia (HCC) 01/24/2018   Acute diverticulitis 07/20/2022   Acute kidney injury superimposed on chronic kidney disease 07/21/2022   Asthma    Blood in stool    Chicken pox    Chronic generalized abdominal pain    Chronic pain    CKD (chronic kidney disease), stage III (HCC)    Colon polyps    COPD (chronic obstructive pulmonary disease) (HCC)    COVID-19 2020   Depression    Diverticulitis    Very severe.  Has had 2 bowel resections and temporary colostomy in the past.   Fall at home, initial encounter 07/21/2022   Fecal incontinence    Frequent headaches    GERD (gastroesophageal reflux disease)    Heart attack (HCC)    Heart disease    High anion gap metabolic acidosis 07/21/2022   History of blood transfusion    Hyperlipemia    Hypertension    Incontinence of feces 12/11/2016   Morbid obesity (HCC)    Pneumonia due to infectious organism 01/12/2018   Postmenopausal 03/21/2021   PTSD (post-traumatic stress disorder)    Recurrent deep vein thrombosis (DVT) (HCC)    x6   Rhabdomyolysis 07/21/2022   Urinary incontinence     Objective:  Physical Exam: Vascular: DP/PT pulses 2/4 bilateral. CFT <3 seconds. Absent hair growth on digits. Edema noted to bilateral lower extremities. Xerosis noted bilaterally.  Skin. No lacerations or abrasions bilateral feet. Nails 1-5 bilateral  are thickened  discolored and elongated with subungual debris.  Musculoskeletal: MMT 5/5 bilateral lower extremities in DF, PF, Inversion and Eversion. Deceased ROM in DF of ankle joint.  Neurological: Sensation intact to light touch. Protective sensation diminished bilateral.    Assessment:   1. Pain due to onychomycosis of toenails of both feet   2. Type 2 diabetes mellitus with peripheral neuropathy (HCC)      Plan:  Patient was evaluated and treated and all questions answered. -Discussed and educated patient on diabetic foot care, especially with  regards to the vascular, neurological and musculoskeletal systems.  -Stressed the importance of good glycemic control and the detriment of not  controlling glucose levels in relation to the foot. -Discussed supportive shoes at all times and checking feet regularly.  -Mechanically debrided all nails 1-5 bilateral using sterile nail nipper and filed with dremel without incident  -Answered all patient questions -Patient to return  in 3 months for at risk foot care -Patient advised to call the office if any problems or questions arise in the meantime.   Asberry Failing, DPM

## 2024-06-30 ENCOUNTER — Other Ambulatory Visit: Payer: Self-pay | Admitting: Cardiovascular Disease

## 2024-06-30 DIAGNOSIS — Z86718 Personal history of other venous thrombosis and embolism: Secondary | ICD-10-CM

## 2024-06-30 MED ORDER — RIVAROXABAN 20 MG PO TABS: 20.0000 mg | ORAL_TABLET | Freq: Every day | ORAL | 1 refills | Status: DC

## 2024-06-30 NOTE — Telephone Encounter (Signed)
 Prescription refill request for Xarelto  received.  Indication:dvt Last office visit:upcoming Weight:112.1  kg Age:73 Scr:1.21  3/25 CrCl:74.37  ml/min  Prescription refilled

## 2024-06-30 NOTE — Telephone Encounter (Signed)
*  STAT* If patient is at the pharmacy, call can be transferred to refill team.   1. Which medications need to be refilled? (please list name of each medication and dose if known) isosorbide  mononitrate (IMDUR ) 60 MG 24 hr tablet   carvedilol  (COREG ) 6.25 MG tablet    XARELTO  20 MG TABS tablet  atorvastatin  (LIPITOR ) 80 MG tablet    2. Would you like to learn more about the convenience, safety, & potential cost savings by using the [facility]? No    3. Are you open to using the [facility] (Type [facility]. No    4. Which pharmacy/location (including street and city if local pharmacy) is medication to be sent to? [id number] - [place],  - [address]     5. Do they need a 30 day or 90 day supply? 90 day

## 2024-06-30 NOTE — Telephone Encounter (Signed)
Prescription refill request for Xarelto received.  Indication:  Last office visit: Weight: Age: Scr: CrCl:  
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
## 2024-07-06 ENCOUNTER — Ambulatory Visit: Admitting: Family Medicine

## 2024-07-06 ENCOUNTER — Encounter: Payer: Self-pay | Admitting: Family Medicine

## 2024-07-06 VITALS — BP 110/78 | HR 78 | Temp 98.0°F | Wt 256.8 lb

## 2024-07-06 DIAGNOSIS — E118 Type 2 diabetes mellitus with unspecified complications: Secondary | ICD-10-CM

## 2024-07-06 DIAGNOSIS — Z23 Encounter for immunization: Secondary | ICD-10-CM

## 2024-07-06 DIAGNOSIS — Z0000 Encounter for general adult medical examination without abnormal findings: Secondary | ICD-10-CM

## 2024-07-06 DIAGNOSIS — I25119 Atherosclerotic heart disease of native coronary artery with unspecified angina pectoris: Secondary | ICD-10-CM

## 2024-07-06 DIAGNOSIS — Z7984 Long term (current) use of oral hypoglycemic drugs: Secondary | ICD-10-CM

## 2024-07-06 DIAGNOSIS — K580 Irritable bowel syndrome with diarrhea: Secondary | ICD-10-CM

## 2024-07-06 DIAGNOSIS — E785 Hyperlipidemia, unspecified: Secondary | ICD-10-CM

## 2024-07-06 DIAGNOSIS — D6869 Other thrombophilia: Secondary | ICD-10-CM

## 2024-07-06 DIAGNOSIS — F330 Major depressive disorder, recurrent, mild: Secondary | ICD-10-CM

## 2024-07-06 DIAGNOSIS — Z86718 Personal history of other venous thrombosis and embolism: Secondary | ICD-10-CM

## 2024-07-06 DIAGNOSIS — G4733 Obstructive sleep apnea (adult) (pediatric): Secondary | ICD-10-CM

## 2024-07-06 DIAGNOSIS — L2989 Other pruritus: Secondary | ICD-10-CM

## 2024-07-06 DIAGNOSIS — J42 Unspecified chronic bronchitis: Secondary | ICD-10-CM

## 2024-07-06 DIAGNOSIS — J4542 Moderate persistent asthma with status asthmaticus: Secondary | ICD-10-CM

## 2024-07-06 DIAGNOSIS — R202 Paresthesia of skin: Secondary | ICD-10-CM | POA: Diagnosis not present

## 2024-07-06 DIAGNOSIS — G4734 Idiopathic sleep related nonobstructive alveolar hypoventilation: Secondary | ICD-10-CM

## 2024-07-06 DIAGNOSIS — I10 Essential (primary) hypertension: Secondary | ICD-10-CM

## 2024-07-06 DIAGNOSIS — N1831 Chronic kidney disease, stage 3a: Secondary | ICD-10-CM

## 2024-07-06 DIAGNOSIS — E538 Deficiency of other specified B group vitamins: Secondary | ICD-10-CM

## 2024-07-06 DIAGNOSIS — I252 Old myocardial infarction: Secondary | ICD-10-CM

## 2024-07-06 DIAGNOSIS — G894 Chronic pain syndrome: Secondary | ICD-10-CM

## 2024-07-06 DIAGNOSIS — E559 Vitamin D deficiency, unspecified: Secondary | ICD-10-CM

## 2024-07-06 DIAGNOSIS — I700 Atherosclerosis of aorta: Secondary | ICD-10-CM

## 2024-07-06 DIAGNOSIS — I2510 Atherosclerotic heart disease of native coronary artery without angina pectoris: Secondary | ICD-10-CM

## 2024-07-06 DIAGNOSIS — M5416 Radiculopathy, lumbar region: Secondary | ICD-10-CM

## 2024-07-06 DIAGNOSIS — J9621 Acute and chronic respiratory failure with hypoxia: Secondary | ICD-10-CM

## 2024-07-06 DIAGNOSIS — G629 Polyneuropathy, unspecified: Secondary | ICD-10-CM | POA: Diagnosis not present

## 2024-07-06 DIAGNOSIS — Z6841 Body Mass Index (BMI) 40.0 and over, adult: Secondary | ICD-10-CM

## 2024-07-06 LAB — CBC
HCT: 46.4 % — ABNORMAL HIGH (ref 36.0–46.0)
Hemoglobin: 15.2 g/dL — ABNORMAL HIGH (ref 12.0–15.0)
MCHC: 32.7 g/dL (ref 30.0–36.0)
MCV: 82.7 fl (ref 78.0–100.0)
Platelets: 293 K/uL (ref 150.0–400.0)
RBC: 5.61 Mil/uL — ABNORMAL HIGH (ref 3.87–5.11)
RDW: 17.1 % — ABNORMAL HIGH (ref 11.5–15.5)
WBC: 8.7 K/uL (ref 4.0–10.5)

## 2024-07-06 LAB — BASIC METABOLIC PANEL WITH GFR
BUN: 21 mg/dL (ref 6–23)
CO2: 27 meq/L (ref 19–32)
Calcium: 10.3 mg/dL (ref 8.4–10.5)
Chloride: 103 meq/L (ref 96–112)
Creatinine, Ser: 1.3 mg/dL — ABNORMAL HIGH (ref 0.40–1.20)
GFR: 41.02 mL/min — ABNORMAL LOW (ref >60.00)
Glucose, Bld: 124 mg/dL — ABNORMAL HIGH (ref 70–99)
Potassium: 3.9 meq/L (ref 3.5–5.1)
Sodium: 140 meq/L (ref 135–145)

## 2024-07-06 LAB — LIPID PANEL
Cholesterol: 145 mg/dL (ref 0–200)
HDL: 43.2 mg/dL (ref >39.00)
LDL Cholesterol: 73 mg/dL (ref 0–99)
NonHDL: 101.87
Total CHOL/HDL Ratio: 3
Triglycerides: 145 mg/dL (ref 0.0–149.0)
VLDL: 29 mg/dL (ref 0.0–40.0)

## 2024-07-06 LAB — MICROALBUMIN / CREATININE URINE RATIO
Creatinine,U: 109.3 mg/dL
Microalb Creat Ratio: 11.4 mg/g (ref 0.0–30.0)
Microalb, Ur: 1.2 mg/dL (ref 0.0–1.9)

## 2024-07-06 LAB — POCT GLYCOSYLATED HEMOGLOBIN (HGB A1C)
HbA1c POC (<> result, manual entry): 7.2 % (ref 4.0–5.6)
HbA1c, POC (controlled diabetic range): 7.2 % — AB (ref 0.0–7.0)
HbA1c, POC (prediabetic range): 7.2 % — AB (ref 5.7–6.4)
Hemoglobin A1C: 7.2 % — AB (ref 4.0–5.6)

## 2024-07-06 LAB — TSH: TSH: 3.61 u[IU]/mL (ref 0.35–5.50)

## 2024-07-06 MED ORDER — AMITRIPTYLINE HCL 75 MG PO TABS: 75.0000 mg | ORAL_TABLET | Freq: Every day | ORAL | 1 refills | Status: AC

## 2024-07-06 MED ORDER — DICYCLOMINE HCL 20 MG PO TABS: 20.0000 mg | ORAL_TABLET | Freq: Four times a day (QID) | ORAL | 1 refills | Status: AC | PRN

## 2024-07-06 MED ORDER — ALBUTEROL SULFATE HFA 108 (90 BASE) MCG/ACT IN AERS: 2.0000 | INHALATION_SPRAY | RESPIRATORY_TRACT | 3 refills | Status: AC | PRN

## 2024-07-06 MED ORDER — ONDANSETRON HCL 4 MG PO TABS: 4.0000 mg | ORAL_TABLET | Freq: Three times a day (TID) | ORAL | 5 refills | Status: AC | PRN

## 2024-07-06 MED ORDER — VITAMIN D3 50 MCG (2000 UT) PO CAPS: 4000.0000 [IU] | ORAL_CAPSULE | Freq: Every day | ORAL | 1 refills | Status: AC

## 2024-07-06 MED ORDER — CLOTRIMAZOLE 1 % EX CREA: 1.0000 | TOPICAL_CREAM | Freq: Two times a day (BID) | CUTANEOUS | 5 refills | Status: AC

## 2024-07-06 MED ORDER — FAMOTIDINE 40 MG PO TABS: 40.0000 mg | ORAL_TABLET | Freq: Every day | ORAL | 1 refills | Status: AC

## 2024-07-06 MED ORDER — HYDROXYZINE HCL 25 MG PO TABS: 25.0000 mg | ORAL_TABLET | Freq: Three times a day (TID) | ORAL | 1 refills | Status: AC | PRN

## 2024-07-06 MED ORDER — FUROSEMIDE 20 MG PO TABS: 20.0000 mg | ORAL_TABLET | Freq: Every day | ORAL | 1 refills | Status: AC | PRN

## 2024-07-06 MED ORDER — MAGNESIUM OXIDE -MG SUPPLEMENT 400 (240 MG) MG PO TABS: 400.0000 mg | ORAL_TABLET | Freq: Two times a day (BID) | ORAL | 1 refills | Status: AC

## 2024-07-06 MED ORDER — EMPAGLIFLOZIN 10 MG PO TABS: 10.0000 mg | ORAL_TABLET | Freq: Every day | ORAL | 1 refills | Status: AC

## 2024-07-06 NOTE — Patient Instructions (Signed)
 Return in 15 weeks (on 10/19/2024) for Routine chronic condition follow-up.        Great to see you today.  I have refilled the medication(s) we provide.   If labs were collected or images ordered, we will inform you of  results once we have received them and reviewed. We will contact you either by echart message, or telephone call.  Please give ample time to the testing facility, and our office to run,  receive and review results. Please do not call inquiring of results, even if you can see them in your chart. We will contact you as soon as we are able. If it has been over 1 week since the test was completed, and you have not yet heard from us , then please call us .    - echart message- for normal results that have been seen by the patient already.   - telephone call: abnormal results or if patient has not viewed results in their echart.  If a referral to a specialist was entered for you, please call us  in 2 weeks if you have not heard from the specialist office to schedule.

## 2024-07-06 NOTE — Progress Notes (Signed)
 Patient ID: Mary Ferguson, female  DOB: 16-Oct-1950, 73 y.o.   MRN: [id number] Patient Care Team    Relationship Specialty Notifications Start End  Catherine Charlies LABOR, DO PCP - General Family Medicine  07/20/22   Francyne Headland, MD Consulting Physician Cardiology  03/21/21   Cleatus Collar, MD Consulting Physician Ophthalmology  03/21/21   Euna Read, MD Referring Physician Neurology  03/21/21   Burnard Debby LABOR, MD (Inactive) Consulting Physician Cardiology  03/21/21   Brenna Lonni PARAS., MD Physician Assistant Sports Medicine  03/21/21   Janeal Suan BROCKS, MD Referring Physician Dermatology  03/21/21   Neda Jennet LABOR, MD Consulting Physician Pulmonary Disease  08/07/22   Joya Stabs, DPM Consulting Physician Podiatry  07/06/24     Chief Complaint  Patient presents with   Diabetes    Chronic condition management Influenza vaccine   Medicare Wellness    Subjective: Mary Ferguson is a 73 y.o.  female present for follow-up on chronic condition management Medication reconciliation completed today Past medical history updated where appropriate.  Chronic generalized abdominal pain/Chronic pain syndrome/Neuropathy/Paresthesia of bilateral legs/Compression fracture of L1 vertebra, initial encounter (HCC)/ Lumbar radiculopathy Patient has noticed fair amount of improvement with her neuropathy with amitriptyline  50 mg nightly. She denies oversedation with the amitriptyline .  She would like to to remain on medication Prior note: Patient has an extensive chronic pain history.  Per her report she has been on very strong high-dose narcotics in the past, and her pain persisted.  She reports a strong family history of substance abuse, and is not interested in narcotics.  She currently is not on any type of medications to help with her discomfort.    B12 deficiency and Vitamin D  deficiency She reports compliance with B12 and vitamin D  supplementation.    Type II diabetes mellitus  with manifestations (HCC) She reports compliance with Jardiance  10 mg daily and is tolerating medication. She is unable to take metformin  with her IBS. She did try it again and she has had diarrhea sx.  She has chronic neuropathy. Patient denies dizziness, hyperglycemic or hypoglycemic events. Patient denies numbness, tingling in the extremities or nonhealing wounds of feet.    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  She is on lifelong anticoagulation therapy.  She reports compliance with Xarelto   Essential hypertension/Dyslipidemia, goal LDL below 70/CAD S/P PCI/History of non-ST elevation myocardial infarction (NSTEMI)//Morbid obesity (HCC)/Coronary artery disease involving native coronary artery of native heart with angina pectoris (HCC)/Aortic atherosclerosis (HCC) Managed by cardiology  Chronic bronchitis, unspecified chronic bronchitis type (HCC)/Moderate persistent asthma, unspecified whether complicated/Nocturnal oxygen desaturation/Obstructive sleep apnea (adult)  Managed by pulmonology     07/06/2024    1:04 PM 10/28/2023    1:59 PM 05/26/2023   10:54 AM 05/13/2023    2:37 PM 09/18/2022    2:51 PM  Depression screen PHQ 2/9  Decreased Interest 0 1 1 1  0  Down, Depressed, Hopeless 0 0 0 0 1  PHQ - 2 Score 0 1 1 1 1   Altered sleeping 0 2  1 0  Tired, decreased energy 0 2  1 1   Change in appetite 0 2  1 1   Feeling bad or failure about yourself  0 2  0 1  Trouble concentrating 0 2  0 0  Moving slowly or fidgety/restless 0 0  1 0  Suicidal thoughts 0 0  0 0  PHQ-9 Score 0  11   5  4    Difficult doing work/chores Not difficult at all Somewhat difficult  Somewhat difficult      Data saved with a previous flowsheet row definition      10/28/2023    2:03 PM 05/13/2023    2:37 PM 09/18/2022    2:51 PM  GAD 7 : Generalized Anxiety Score  Nervous, Anxious, on Edge  0 0  Control/stop worrying 1  0 0  Worry too much - different things 2 0 0  Trouble relaxing 2 0 0  Restless 0 0 0  Easily annoyed or irritable 1 0 0  Afraid - awful might happen 1 0 0  Total GAD 7 Score  0 0  Anxiety Difficulty Somewhat difficult             07/06/2024   12:58 PM 10/28/2023    1:59 PM 05/13/2023    2:36 PM 08/07/2022    8:43 AM 04/06/2022   11:07 AM  Fall Risk   Falls in the past year? 1 1 1 1 1   Number falls in past yr: 0 1 0 1 1  Injury with Fall? 0 0 0 1 1  Risk for fall due to : History of fall(s) Impaired balance/gait Impaired mobility;Impaired balance/gait;History of fall(s) History of fall(s) History of fall(s)  Follow up Falls evaluation completed Falls evaluation completed Falls evaluation completed Falls evaluation completed  Falls evaluation completed      Data saved with a previous flowsheet row definition   Immunization History  Administered Date(s) Administered   Fluad Quad(high Dose 65+) 05/08/2021, 08/07/2022   Fluad Trivalent(High Dose 65+) 05/13/2023   Fluzone Influenza virus vaccine,trivalent (IIV3), split virus 05/23/2009   Influenza, Seasonal, Injecte, Preservative Fre 05/08/2010, 05/22/2012, 06/22/2015   Influenza,inj,Quad PF,6+ Mos 06/22/2015   Influenza,inj,quad, With Preservative 07/05/2016   Influenza-Unspecified 05/10/2013, 05/05/2014   MODERNA SARS COV-2 Pediatric Vaccination 6mos to <63yrs 08/12/2020, 04/02/2021   Moderna Sars-Covid-2 Vaccination 11/04/2019, 12/02/2019, 08/12/2020, 04/02/2021   PNEUMOCOCCAL CONJUGATE-20 04/05/2022   Pneumococcal Conjugate-13 08/26/2004, 10/27/2018   Pneumococcal Polysaccharide-23 10/27/2018    No results found.  Past Medical History:  Diagnosis Date   Abdominal wall hernia 01/27/2014   Formatting of this note might be different from the original.  With incarceration. 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     Acute and chronic respiratory failure with hypoxia (HCC) 01/24/2018   Acute diverticulitis 07/20/2022   Acute kidney injury  superimposed on chronic kidney disease 07/21/2022   Asthma    Blood in stool    Chicken pox    Chronic generalized abdominal pain    Chronic pain    CKD (chronic kidney disease), stage III (HCC)    Colon polyps    Compression fracture of L1 lumbar vertebra (HCC)    COPD (chronic obstructive pulmonary disease) (HCC)    COVID-19 2020   Depression    Diverticulitis    Very severe.  Has had 2 bowel resections and temporary colostomy in the past.   Fall at home, initial encounter 07/21/2022   Fecal incontinence    Frequent headaches    GERD (gastroesophageal reflux disease)    Heart attack (HCC)    Heart disease    High anion gap metabolic acidosis 07/21/2022   History of blood transfusion    Hyperlipemia    Hypertension    Incontinence of feces 12/11/2016   Pneumonia due to infectious organism 01/12/2018   Postmenopausal 03/21/2021   PTSD (post-traumatic stress disorder)    Recurrent  deep vein thrombosis (DVT) (HCC)    x6   Rhabdomyolysis 07/21/2022   Urinary incontinence    Allergies  Allergen Reactions   Rofecoxib Anaphylaxis    VIOXX   Metformin  And Related Other (See Comments)    Ibs    Sulfonylureas Other (See Comments)    Dizziness   Benadryl Allergy [Diphenhydramine Hcl] Other (See Comments)    Depressed respirations   Cephalexin Rash   Gatifloxacin Other (See Comments)    Confusion Very low blood pressure Teequinn   Past Surgical History:  Procedure Laterality Date   ABDOMINAL HYSTERECTOMY  1995   APPENDECTOMY     CATARACT EXTRACTION Bilateral    CESAREAN SECTION  1984   CHOLECYSTECTOMY  2016   COLOSTOMY     and reversal   CORONARY STENT INTERVENTION N/A 06/18/2019   Procedure: CORONARY STENT INTERVENTION;  Surgeon: Darron Deatrice LABOR, MD;  Location: [facility];  Service: Cardiovascular;  Laterality: N/A;   ENDOMETRIAL ABLATION     HERNIA REPAIR  1985   1985 and 2016 (2016 ventral w/ incarceration) 33 surgeries   HYSTEROSCOPY WITH D & C      13   LAPAROSCOPIC LYSIS OF ADHESIONS     x2   LEFT HEART CATH AND CORONARY ANGIOGRAPHY N/A 06/18/2019   Procedure: LEFT HEART CATH AND CORONARY ANGIOGRAPHY;  Surgeon: Darron Deatrice LABOR, MD;  Location: [facility];  Service: Cardiovascular;  Laterality: N/A;   LEFT HEART CATH AND CORONARY ANGIOGRAPHY N/A 11/30/2019   Procedure: LEFT HEART CATH AND CORONARY ANGIOGRAPHY;  Surgeon: Burnard Debby LABOR, MD;  Location: [facility];  Service: Cardiovascular;  Laterality: N/A;   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   TONSILLECTOMY  1959   VEIN SURGERY     laser x2   Family History  Problem Relation Age of Onset   Depression Mother    Drug abuse Mother    Mental illness Mother    Learning disabilities Mother    Miscarriages / Stillbirths Mother    Alcohol abuse Mother    Mental illness Father    Depression Father    Heart disease Father    Drug abuse Sister    Depression Sister    Asthma Sister    Alcohol abuse Sister    Early death Sister    Mental illness Son    Hypertension Son    Drug abuse Son    Depression Son    Asthma Son    Alcohol abuse Son    Clotting disorder Son    Heart disease Maternal Grandmother    Diabetes Maternal Grandmother    Arthritis Maternal Grandmother    COPD Maternal Grandmother    Parkinson's disease Maternal Grandmother    Heart disease Maternal Grandfather    Heart attack Maternal Grandfather    Early death Maternal Grandfather    COPD Maternal Grandfather    Heart disease Paternal Grandmother    Heart attack Paternal Grandmother    Depression Paternal Grandmother    Arthritis Paternal Grandmother    Breast cancer Paternal Grandmother    Heart disease Paternal Grandfather    Heart attack Paternal Grandfather    Early death Paternal Grandfather    COPD Paternal Grandfather    Mental illness Half-Brother    Learning disabilities Half-Brother    Hypertension Half-Brother    Hyperlipidemia Half-Brother    Drug abuse  Half-Brother    Heart disease Half-Brother    Diabetes Half-Brother  Depression Half-Brother    Asthma Half-Brother    Alcohol abuse Half-Brother    Arthritis Half-Brother    Kidney disease Half-Brother    Testicular cancer Half-Brother    Diabetes Half-Sister    Mental illness Half-Sister    Learning disabilities Half-Sister    Drug abuse Half-Sister    Depression Half-Sister    Alcohol abuse Half-Sister    Thyroid  cancer Half-Sister    Social History   Social History Narrative   Marital status/children/pets:    Education/employment: retired     - wears seatbelt: Yes     - Feels safe in their relationships: Yes       Allergies as of 07/06/2024       Reactions   Rofecoxib Anaphylaxis   VIOXX   Metformin  And Related Other (See Comments)   Ibs    Sulfonylureas Other (See Comments)   Dizziness   Benadryl Allergy [diphenhydramine Hcl] Other (See Comments)   Depressed respirations   Cephalexin Rash   Gatifloxacin Other (See Comments)   Confusion Very low blood pressure Teequinn        Medication List        Accurate as of July 06, 2024  1:31 PM. If you have any questions, ask your nurse or doctor.          acetaminophen  500 MG tablet Commonly known as: TYLENOL  Take 500-1,000 mg by mouth as needed for mild pain or moderate pain.   albuterol  108 (90 Base) MCG/ACT inhaler Commonly known as: VENTOLIN  HFA Inhale 2 puffs into the lungs every 4 (four) hours as needed for wheezing or shortness of breath.   amitriptyline  75 MG tablet Commonly known as: ELAVIL  Take 1 tablet (75 mg total) by mouth at bedtime.   atorvastatin  80 MG tablet Commonly known as: LIPITOR  Take 1 tablet (80 mg total) by mouth every evening.   carvedilol  6.25 MG tablet Commonly known as: COREG  TAKE 1 TABLET BY MOUTH EVERY MORNING AND 2 TABLETS EVERY EVENING.   clotrimazole  1 % cream Commonly known as: LOTRIMIN  Apply 1 Application topically 2 (two) times daily.   dicyclomine   20 MG tablet Commonly known as: BENTYL  Take 1 tablet (20 mg total) by mouth 4 (four) times daily as needed for spasms.   empagliflozin  10 MG Tabs tablet Commonly known as: JARDIANCE  Take 1 tablet (10 mg total) by mouth daily before breakfast.   famotidine  40 MG tablet Commonly known as: PEPCID  Take 1 tablet (40 mg total) by mouth at bedtime.   furosemide  20 MG tablet Commonly known as: LASIX  Take 1 tablet (20 mg total) by mouth daily as needed.   hydrOXYzine  25 MG tablet Commonly known as: ATARAX  Take 1 tablet (25 mg total) by mouth every 8 (eight) hours as needed for itching.   ipratropium-albuterol  0.5-2.5 (3) MG/3ML Soln Commonly known as: DUONEB Take 3 mLs by nebulization every 4 (four) hours as needed.   isosorbide  mononitrate 60 MG 24 hr tablet Commonly known as: IMDUR  TAKE 1 TABLET(60 MG) BY MOUTH DAILY   magnesium  oxide 400 (240 Mg) MG tablet Commonly known as: MAGnesium -Oxide Take 1 tablet (400 mg total) by mouth 2 (two) times daily.   nitroGLYCERIN  0.4 MG SL tablet Commonly known as: NITROSTAT  Place 1 tablet (0.4 mg total) under the tongue every 5 (five) minutes as needed for chest pain.   ondansetron  4 MG tablet Commonly known as: ZOFRAN  Take 1 tablet (4 mg total) by mouth every 8 (eight) hours as needed for nausea or vomiting.  Started by: Ashani Pumphrey   psyllium 95 % Pack Commonly known as: HYDROCIL/METAMUCIL Take 1 packet by mouth daily.   rivaroxaban  20 MG Tabs tablet Commonly known as: Xarelto  Take 1 tablet (20 mg total) by mouth daily with supper.   True Metrix Blood Glucose Test test strip Generic drug: glucose blood Use as instructed   TRUEplus Lancets 33G Misc 1 Stick by Does not apply route daily.   Vitamin D3 50 MCG (2000 UT) capsule Take 2 capsules (4,000 Units total) by mouth daily.        All past medical history, surgical history, allergies, family history, immunizations andmedications were updated in the EMR today and reviewed  under the history and medication portions of their EMR.         ROS: 14 pt review of systems performed and negative (unless mentioned in an HPI)  Objective: BP 110/78   Pulse 78   Temp 98 F (36.7 C)   Wt 256 lb 12.8 oz (116.5 kg)   SpO2 98%   BMI 46.97 kg/m  Physical Exam Vitals and nursing note reviewed.  Constitutional:      General: She is not in acute distress.    Appearance: Normal appearance. She is not ill-appearing, toxic-appearing or diaphoretic.  HENT:     Head: Normocephalic and atraumatic.  Eyes:     General: No scleral icterus.       Right eye: No discharge.        Left eye: No discharge.     Extraocular Movements: Extraocular movements intact.     Conjunctiva/sclera: Conjunctivae normal.     Pupils: Pupils are equal, round, and reactive to light.  Cardiovascular:     Rate and Rhythm: Normal rate and regular rhythm.  Pulmonary:     Effort: Pulmonary effort is normal. No respiratory distress.     Breath sounds: Normal breath sounds. No wheezing, rhonchi or rales.  Musculoskeletal:     Right lower leg: No edema.     Left lower leg: No edema.  Skin:    General: Skin is warm.     Findings: No rash.  Neurological:     Mental Status: She is alert and oriented to person, place, and time. Mental status is at baseline.     Motor: No weakness.     Gait: Gait normal.  Psychiatric:        Mood and Affect: Mood normal.        Behavior: Behavior normal.        Thought Content: Thought content normal.        Judgment: Judgment normal.    Diabetic Foot Exam - Simple   Simple Foot Form Diabetic Foot exam was performed with the following findings: Yes 07/06/2024  1:01 PM  Visual Inspection Sensation Testing Intact to touch and monofilament testing bilaterally: Yes Pulse Check Posterior Tibialis and Dorsalis pulse intact bilaterally: Yes Comments     Assessment/plan: Mary Ferguson is a 73 y.o. female present for chronic condition management Chronic pain  syndrome/Neuropathy/ Lumbar radiculopathy -She has reportedly been on many pain medicines in the past and did not find them helpful.  She is not interested in narcotics.  She has history of compression L1 fraction and lumbar radiculopathy (L4-L5 region). I believe her pain is multifactorial.   -Consider Cymbalta -may be an option later. -Consider Lyrica-also may be an option later.   Continue Elavil  75 mg nightly.  She understands not stop abruptly because she does not think it is working.  Hopefully this will help with neuropathic pain, depression and she may benefit with GI complaints. -NSAIDs contraindicated with her cardiac history and Xarelto  use. -She is currently being managed with lower back pain by Ortho team with injections. -Referral to vascular and vein had been placed for her   CDK 3A: Avoid NSAIDs Renally dose meds were appropriate Low-sodium diet PTH/calcium  vitamin D  yearly  IBS: Continue Bentyl  as needed Continue Elavil   B12 deficiency and Vitamin D  deficiency Continue oral supplements . Vitamin D  levels UTD 01/2024  GERD: Stable Continue Protonix  Type II diabetes mellitus with manifestations (HCC)/diabetic neuropathy/morbid obesity/BMI 45-49.9 stable A1c increased to 7.2 > 7.1> 7.2 >6.9>7.1>7.0> 7.2 collected today Continue jardiance  10 mg every day.  Metformin  has been tried twice and has caused by GI symptoms both times.   Sulfonylurea class not ideal with her gait instability and side effect of dizziness  Patient is to focus on her diet,.  Follow a diabetic diet. Pneumonia vaccination completed Ur microalb ordered 07/06/2024 Eye:[facility] eye exam 05/13/2023-referral to ophthalmology since [facility] no longer assists with diabetic eye exam clinics.  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  Essential  hypertension/Dyslipidemia, goal LDL below 70/CAD S/P PCI/History of non-ST elevation myocardial infarction (NSTEMI)//Morbid obesity (HCC)/Coronary artery disease involving native coronary artery of native heart with angina pectoris (HCC)/Aortic atherosclerosis (HCC)-angina pectoris-Imdur  therapy Stable Medications are managed by cardiology-amlodipine , atorvastatin , carvedilol , Imdur , nitro, Xarelto  Continue Lasix  20 mg daily as needed  COPD (HCC)/Moderate persistent asthma/Nocturnal oxygen desaturation/Obstructive sleep apnea (adult)  Patient is having fatigue with reported history of COPD, asthma, OSA and use of oxygen at night.   - Ambulatory referral to Pulmonology placed prior> pt has not followed up since 07/2021> no showed appt x2- placed again  Chronic pruritic rash adult/chronic venous insufficiency: Clotrimazole  prn  Influenza vaccine provider today  Return in 15 weeks (on 10/19/2024) for Routine chronic condition follow-up.  Orders Placed This Encounter  Procedures   Urine Microalbumin w/creat. ratio   Basic Metabolic Panel (BMET)   CBC   TSH   Lipid panel   Ambulatory referral to Ophthalmology   Ambulatory referral to Pulmonology   POCT glycosylated hemoglobin (Hb A1C)     Meds ordered this encounter  Medications   amitriptyline  (ELAVIL ) 75 MG tablet    Sig: Take 1 tablet (75 mg total) by mouth at bedtime.    Dispense:  90 tablet    Refill:  1   Cholecalciferol (VITAMIN D3) 50 MCG (2000 UT) capsule    Sig: Take 2 capsules (4,000 Units total) by mouth daily.    Dispense:  180 capsule    Refill:  1   dicyclomine  (BENTYL ) 20 MG tablet    Sig: Take 1 tablet (20 mg total) by mouth 4 (four) times daily as needed for spasms.    Dispense:  180 tablet    Refill:  1   empagliflozin  (JARDIANCE ) 10 MG TABS tablet    Sig: Take 1 tablet (10 mg total) by mouth daily before breakfast.    Dispense:  90 tablet    Refill:  1   famotidine  (PEPCID ) 40 MG tablet    Sig: Take 1  tablet (40 mg total) by mouth at bedtime.    Dispense:  90 tablet    Refill:  1   furosemide  (LASIX ) 20 MG tablet    Sig: Take 1 tablet (20 mg total) by mouth daily  as needed.    Dispense:  90 tablet    Refill:  1   hydrOXYzine  (ATARAX ) 25 MG tablet    Sig: Take 1 tablet (25 mg total) by mouth every 8 (eight) hours as needed for itching.    Dispense:  270 tablet    Refill:  1   MAGnesium -Oxide 400 (240 Mg) MG tablet    Sig: Take 1 tablet (400 mg total) by mouth 2 (two) times daily.    Dispense:  180 tablet    Refill:  1   albuterol  (VENTOLIN  HFA) 108 (90 Base) MCG/ACT inhaler    Sig: Inhale 2 puffs into the lungs every 4 (four) hours as needed for wheezing or shortness of breath.    Dispense:  1 each    Refill:  3   clotrimazole  (LOTRIMIN ) 1 % cream    Sig: Apply 1 Application topically 2 (two) times daily.    Dispense:  30 g    Refill:  5   ondansetron  (ZOFRAN ) 4 MG tablet    Sig: Take 1 tablet (4 mg total) by mouth every 8 (eight) hours as needed for nausea or vomiting.    Dispense:  30 tablet    Refill:  5     Referral Orders         Ambulatory referral to Ophthalmology         Ambulatory referral to Pulmonology     Medicare wellness also completed today   Note is dictated utilizing voice recognition software. Although note has been proof read prior to signing, occasional typographical errors still can be missed. If any questions arise, please do not hesitate to call for verification.  Electronically signed by: Charlies Bellini, DO [facility]

## 2024-07-06 NOTE — Addendum Note (Signed)
 Addended by: CLAUDENE SHANDA ORN on: 07/06/2024 02:13 PM   Modules accepted: Orders

## 2024-07-07 ENCOUNTER — Ambulatory Visit: Payer: Self-pay | Admitting: Family Medicine

## 2024-07-20 ENCOUNTER — Ambulatory Visit: Admitting: Cardiovascular Disease
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
## 2024-07-30 ENCOUNTER — Ambulatory Visit: Admitting: General Practice

## 2024-07-31 NOTE — Progress Notes (Deleted)
 Cardiology Office Note:    Date:  07/31/2024   ID:  Mary Ferguson, DOB 02-May-1951, MRN [id number]  PCP:  Mary Charlies LABOR, DO  Cardiologist:  None { Click to update primary MD,subspecialty MD or APP then REFRESH:1}    Referring MD: Mary Charlies LABOR, DO   Chief Complaint: follow-up of CAD  History of Present Illness:    Mary Ferguson is a 73 y.o. female with a history of CAD with STEMI in 05/2019 s/p DES to RCA, recurrent DVT on Xarelto , chronic lower extremity venous insufficiency s/p remote ablation bilaterally, hypertension, hyperlipidemia, type 2 diabetes mellitus, CKD stage III, COPD, chronic pain, and PTSD who is followed by Dr. Francyne and presents today for routine follow-up.  Patient was admitted in 05/2019 with an inferior STEMI.  LHC showed 100% stenosis of mid RCA, 20% stenosis of proximal to mid LAD followed by 70% stenosis of mid LAD, 30% stenosis of proximal LCx, and 60% stenosis of third RPL.  She underwent successful PCI with DES to the mid and distal RCA.  Medical therapy was recommended for the LAD lesion.  Echo showed LVEF of 60-65% with severe LVH and grade 1 diastolic dysfunction, normal RV function, and no significant valvular disease.  Repeat LHC in 11/2019 for recurrent chest pain showed widely patent RCA stents with otherwise mild to moderate disease.  Continue medical therapy was recommended.   She also has a history of chronic lower extremity venous insufficiency with recurrent DVTs.  She underwent remote bilateral great saphenous vein ablation around 2008 at [facility].  She is on chronic anticoagulation with Xarelto .  Last lower extremity venous Doppler in 12/2021 showed no evidence of DVT but did show bilateral venous reflux.   She was last seen by Mary Ferguson, in 06/2023 at which time she had 2 major complaints - dizziness when quickly turning her head and intermittent chest pain when walking or emotionally stressed.  She felt like this pain was similar to what she  experienced in 2020 prior to her MI.  Cardiac PET stress test was ordered for further evaluation but this was never completed. Carotid Dopplers were ordered for further evaluation of dizziness but this was also never done.  Patient presents today for follow-up. ***  CAD History of inferior STEMI in 05/2019 s/p DES to RCA. Last LHC in 11/2019 showed patent stents with otherwise mild to moderate disease.  She did report intermittent chest pain at last visit in 06/2023 that was similar to her prior angina.  A cardiac PET stress test was ordered at that time but was never completed. - *** - Continue current antianginals: Imdur  60mg  daily and Coreg  6.25mg  twice daily.  - No Aspirin  given need for chronic anticoagulation. - Continue Lipitor  80 mg daily.  Dizziness ***  Chronic Venous Insufficiency Recurrent DVTs Patient has a history of chronic venous insufficiency s/p remote ablation to bilateral great saphenous veins.  She also has a history of recurrent DVTs and is on chronic anticoagulation. - Edema stable. - Continue Xarelto  20 mg daily. - Continue Lasix  20 mg daily as needed for worsening edema. *** - Continue compression stockings and reduce sodium intake.  Hypertension BP *** - Continue Imdur  and Coreg  as above.  Hyperlipidemia Lipid panel in 06/2024: Total Cholesterol 145, Triglycerides 145, HDL 43.2, LDL 73. LDL goal <55.  - Continue Lipitor  80mg  daily.  - Zetia ***  Type 2 Diabetes Mellitus Hemoglobin A1c 7.2% in 06/2024.  - On Jardiance .  - Management per PCP.  CKD Stage III Baseline creatinine around 1.2 to 1.3.   EKGs/Labs/Other Studies Reviewed:    The following studies were reviewed:  Echocardiogram 06/18/2019: Impressions: 1. Left ventricular ejection fraction, by visual estimation, is 60 to  65%. The left ventricle has normal function. Normal left ventricular size.  There is severely increased left ventricular hypertrophy. The left  ventricular hypertrophy  involves  basal-septum walls.   2. Left ventricular diastolic Doppler parameters are consistent with  impaired relaxation pattern of LV diastolic filling.   3. Global right ventricle has normal systolic function.The right  ventricular size is normal. No increase in right ventricular wall  thickness.   4. Left atrial size was normal.   5. Right atrial size was normal.   6. Mild to moderate mitral annular calcification. No evidence of mitral  valve regurgitation. No evidence of mitral stenosis.   7. The tricuspid valve is normal in structure. Tricuspid valve  regurgitation was not visualized by color flow Doppler.   8. The aortic valve is tricuspid Aortic valve regurgitation was not  visualized by color flow Doppler. Mild aortic valve sclerosis without  stenosis.   9. The pulmonic valve was normal in structure. Pulmonic valve  regurgitation is not visualized by color flow Doppler.  10. The inferior vena cava is normal in size with greater than 50%  respiratory variability, suggesting right atrial pressure of 3 mmHg.  _______________  Left Cardiac Catheterization 11/30/2019: Previously placed Prox RCA to Mid RCA stent (unknown type) is widely patent. Previously placed Dist RCA stent (unknown type) is widely patent. Prox Cx to Mid Cx lesion is 30% stenosed. Prox LAD lesion is 30% stenosed. Mid LAD-1 lesion is 40% stenosed. Mid LAD-2 lesion is 70% stenosed. Dist LAD lesion is 40% stenosed.   Widely patent mid and distal RCA stents in the dominant RCA vessel.   Mild to moderate concomitant CAD with 30 and 40% proximal LAD stenoses, no change in the previously noted focal 70% mid LAD stenosis with 40% narrowing beyond this segment; normal small ramus intermediate vessel and tortuous circumflex vessel with 30% proximal narrowing.   LVEDP 8 mmHg   RECOMMENDATION: Continue Plavix  and resume Xarelto  tomorrow.  Will DC aspirin .  Plan increase medical therapy trial with titration of carvedilol   to 6.25 mg twice a day and isosorbide  to 60 mg daily.  Aggressive lipid-lowering therapy with target LDL less than 70.  Optimal blood pressure control with target blood pressure less than 130/80 and ideal blood pressure less than 120/80.  Patient will follow up w  ith Dr. Francyne.  Diagnostic Dominance: Right    _______________  Lower Extremity Venous Dopplers 01/08/2022: Summary:  Right:  - No evidence of deep vein thrombosis seen in the right lower extremity,  from the common femoral through the popliteal veins.  - No evidence of superficial venous thrombosis in the right lower  extremity.  - No evidence of superficial venous reflux seen in the right short  saphenous vein.  - Venous reflux is noted in the right sapheno-femoral junction.  - Venous reflux is noted in the right greater saphenous vein in the thigh.    Left:  - No evidence of deep vein thrombosis seen in the left lower extremity,  from the common femoral through the popliteal veins.  - No evidence of superficial venous thrombosis in the left lower  extremity.  - Venous reflux is noted in the left sapheno-femoral junction.  - Venous reflux is noted in the left greater saphenous vein  in the thigh.  - Venous reflux is noted in the left short saphenous vein.    EKG:  EKG ordered today. EKG personally reviewed and demonstrates ***.  Recent Labs: 10/28/2023: ALT 11; Magnesium  2.5 07/06/2024: BUN 21; Creatinine, Ser 1.30; Hemoglobin 15.2; Platelets 293.0; Potassium 3.9; Sodium 140; TSH 3.61  Recent Lipid Panel    Component Value Date/Time   CHOL 145 07/06/2024 1302   CHOL 128 01/10/2022 0948   TRIG 145.0 07/06/2024 1302   HDL 43.20 07/06/2024 1302   HDL 41 01/10/2022 0948   CHOLHDL 3 07/06/2024 1302   VLDL 29.0 07/06/2024 1302   LDLCALC 73 07/06/2024 1302   LDLCALC 68 01/10/2022 0948    Physical Exam:    Vital Signs: There were no vitals taken for this visit.    Wt Readings from Last 3 Encounters:  07/06/24  256 lb 12.8 oz (116.5 kg)  01/28/24 247 lb 3.2 oz (112.1 kg)  10/28/23 240 lb 12.8 oz (109.2 kg)     General: 73 y.o. female in no acute distress. HEENT: Normocephalic and atraumatic. Sclera clear.  Neck: Supple. No carotid bruits. No JVD. Heart: *** RRR. Distinct S1 and S2. No murmurs, gallops, or rubs.  Lungs: No increased work of breathing. Clear to ausculation bilaterally. No wheezes, rhonchi, or rales.  Abdomen: Soft, non-distended, and non-tender to palpation.  Extremities: No lower extremity edema.  Radial and distal pedal pulses 2+ and equal bilaterally. Skin: Warm and dry. Neuro: No focal deficits. Psych: Normal affect. Responds appropriately.   Assessment:    No diagnosis found.  Plan:     Disposition: Follow up in ***   Signed, Aline FORBES Door, PA-C  07/31/2024 10:27 AM    [facility]

## 2024-08-02 ENCOUNTER — Ambulatory Visit: Admitting: Student

## 2024-08-02 ENCOUNTER — Encounter: Payer: Self-pay | Admitting: Cardiovascular Disease

## 2024-08-02 NOTE — Telephone Encounter (Signed)
 Error

## 2024-08-30 ENCOUNTER — Other Ambulatory Visit: Payer: Self-pay | Admitting: Cardiovascular Disease

## 2024-09-01 MED ORDER — CARVEDILOL 6.25 MG PO TABS: ORAL_TABLET | ORAL | 0 refills | Status: AC

## 2024-09-02 ENCOUNTER — Telehealth: Payer: Self-pay | Admitting: Cardiovascular Disease

## 2024-09-02 ENCOUNTER — Encounter: Payer: Self-pay | Admitting: Podiatry

## 2024-09-02 ENCOUNTER — Telehealth: Payer: Self-pay | Admitting: Family Medicine

## 2024-09-02 ENCOUNTER — Ambulatory Visit: Payer: Self-pay

## 2024-09-02 ENCOUNTER — Ambulatory Visit: Admitting: Podiatry

## 2024-09-02 DIAGNOSIS — E1142 Type 2 diabetes mellitus with diabetic polyneuropathy: Secondary | ICD-10-CM | POA: Diagnosis not present

## 2024-09-02 DIAGNOSIS — M79675 Pain in left toe(s): Secondary | ICD-10-CM | POA: Diagnosis not present

## 2024-09-02 DIAGNOSIS — B351 Tinea unguium: Secondary | ICD-10-CM

## 2024-09-02 DIAGNOSIS — M79674 Pain in right toe(s): Secondary | ICD-10-CM | POA: Diagnosis not present

## 2024-09-02 DIAGNOSIS — Z86718 Personal history of other venous thrombosis and embolism: Secondary | ICD-10-CM

## 2024-09-02 MED ORDER — RIVAROXABAN 20 MG PO TABS: 20.0000 mg | ORAL_TABLET | Freq: Every day | ORAL | 0 refills | Status: AC

## 2024-09-02 NOTE — Telephone Encounter (Signed)
 Source  Remi Cea (Patient)   Subject  Remi Cea (Patient)   Topic  Clinical - Medical Advice    Communication  Reason for CRM: Pt called for a refill request today and broke down in tears to the point she could barely talk. She states that the pharmacy is giving her a hard time getting her meds and its become a hassle. She is not on some of her medications currently (amitriptyline  is one I was able to make out) and I offered to request a refill for the ones she's missing but she was too distraught. She does not drive anymore, has a po box because kids steal the mail and her son works a lot so it makes it hard for her to get to alternate places like other pharmacies. Next appt is not until March   Patient Information  Patient Name Gender DOB SSN  Mary Ferguson, Mary Ferguson Female [birth year] [id number]   Contacts  Contact Date/Time Type Contact Phone/Fax  09/02/2024 11:28 AM EST Phone (Incoming) Mary Ferguson, Mary Ferguson (Self) [phone] (H)   Routing History   From To Priority  09/02/2024 11:32 AM Jannetta Dutton R P [facility] Routine    Spoke with pt, the last pill bottle she had has the date 9/4. Pt requesting us  to call the pharmacy for assistance, she was made aware we will call and let her know of an update after lunch.   Spoke with pharmacy staff; she refilled most of the medications on Nov 11th 90 d/s. They have Carvedilol  ready but only PRN meds can refilled (Zofran  or Albuterol  inhaler)

## 2024-09-02 NOTE — Telephone Encounter (Signed)
 LVM for pt to return call regarding medication. Pt needs speak with CMA in office.

## 2024-09-02 NOTE — Telephone Encounter (Signed)
" °*  STAT* If patient is at the pharmacy, call can be transferred to refill team.   1. Which medications need to be refilled? (please list name of each medication and dose if known)  rivaroxaban  (XARELTO ) 20 MG TABS tablet   2. Would you like to learn more about the convenience, safety, & potential cost savings by using the [facility]? no   3. Are you open to using the [facility] (Type [facility]. no   4. Which pharmacy/location (including street and city if local pharmacy) is medication to be sent to?  [id number] - [place], [place] - [address]    5. Do they need a 30 day or 90 day supply? 90 days    Pt has been out for 10 days. Pt  scheduled 1/22. "

## 2024-09-02 NOTE — Telephone Encounter (Signed)
 Prescription refill request for Xarelto  received.  Indication: Hx of DVT Last office visit: 07/02/23 Weight: 253 lbs Age:74 Scr: 1.30 (07/06/24) CrCl: 73  Will send in refill for 30 days, to get patient to her appointment.

## 2024-09-02 NOTE — Telephone Encounter (Unsigned)
 Copied from CRM [id number]. Topic: Clinical - Medication Refill >> Sep 02, 2024 11:18 AM Vena HERO wrote: Medication: empagliflozin  (JARDIANCE ) 10 MG TABS tablet  Has the patient contacted their pharmacy? Yes (Agent: If no, request that the patient contact the pharmacy for the refill. If patient does not wish to contact the pharmacy document the reason why and proceed with request.) (Agent: If yes, when and what did the pharmacy advise?) Told pt to call provider  This is the patient's preferred pharmacy:  [id number] - [place], [place] - [address] [place] [address] Phone: [phone] Fax: [phone]  Is this the correct pharmacy for this prescription? Yes If no, delete pharmacy and type the correct one.   Has the prescription been filled recently? No  Is the patient out of the medication? Yes  Has the patient been seen for an appointment in the last year OR does the patient have an upcoming appointment? Yes  Can we respond through MyChart? No  Agent: Please be advised that Rx refills may take up to 3 business days. We ask that you follow-up with your pharmacy.

## 2024-09-02 NOTE — Progress Notes (Signed)
 "  Subjective:  Patient ID: Mary Ferguson, female    DOB: 18-Feb-1951,   MRN: [id number]  Chief Complaint  Patient presents with   Diabetes    My feet have hurt since I came here last time.  Saw  Kuneff, Renee A, DO - 07/06/2024; A1c - 7.2    74 y.o. female presents for concern of thickened elongated and painful nails that are difficult to trim. Requesting to have them trimmed today. Relates burning and tingling in their feet. Patient is diabetic and last A1c was  Lab Results  Component Value Date   HGBA1C 7.2 (A) 07/06/2024   HGBA1C 7.2 07/06/2024   HGBA1C 7.2 (A) 07/06/2024   HGBA1C 7.2 (A) 07/06/2024   .   PCP:  Catherine, Renee A, DO    . Denies any other pedal complaints. Denies n/v/f/c.   Past Medical History:  Diagnosis Date   Abdominal wall hernia 01/27/2014   Formatting of this note might be different from the original.  With incarceration. 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     Acute and chronic respiratory failure with hypoxia (HCC) 01/24/2018   Acute diverticulitis 07/20/2022   Acute kidney injury superimposed on chronic kidney disease 07/21/2022   Asthma    Blood in stool    Chicken pox    Chronic generalized abdominal pain    Chronic pain    CKD (chronic kidney disease), stage III (HCC)    Colon polyps    Compression fracture of L1 lumbar vertebra (HCC)    COPD (chronic obstructive pulmonary disease) (HCC)    COVID-19 2020   Depression    Diverticulitis    Very severe.  Has had 2 bowel resections and temporary colostomy in the past.   Fall at home, initial encounter 07/21/2022   Fecal incontinence    Frequent headaches    GERD (gastroesophageal reflux disease)    Heart attack (HCC)    Heart disease    High anion gap metabolic acidosis 07/21/2022   History of blood transfusion    Hyperlipemia    Hypertension    Incontinence of feces 12/11/2016   Pneumonia due to infectious organism 01/12/2018   Postmenopausal 03/21/2021   PTSD (post-traumatic stress disorder)     Recurrent deep vein thrombosis (DVT) (HCC)    x6   Rhabdomyolysis 07/21/2022   Urinary incontinence     Objective:  Physical Exam: Vascular: DP/PT pulses 2/4 bilateral. CFT <3 seconds. Absent hair growth on digits. Edema noted to bilateral lower extremities. Xerosis noted bilaterally.  Skin. No lacerations or abrasions bilateral feet. Nails 1-5 bilateral  are thickened discolored and elongated with subungual debris.  Musculoskeletal: MMT 5/5 bilateral lower extremities in DF, PF, Inversion and Eversion. Deceased ROM in DF of ankle joint.  Neurological: Sensation intact to light touch. Protective sensation diminished bilateral.    Assessment:   1. Pain due to onychomycosis of toenails of both feet   2. Type 2 diabetes mellitus with peripheral neuropathy (HCC)      Plan:  Patient was evaluated and treated and all questions answered. -Discussed and educated patient on diabetic foot care, especially with  regards to the vascular, neurological and musculoskeletal systems.  -Stressed the importance of good glycemic control and the detriment of not  controlling glucose levels in relation to the foot. -Discussed supportive shoes at all times and checking feet regularly.  -Mechanically debrided all nails 1-5 bilateral using sterile nail nipper and filed with dremel without incident  -Answered all patient questions -Patient to  return  in 3 months for at risk foot care -Patient advised to call the office if any problems or questions arise in the meantime.   Asberry Failing, DPM    "

## 2024-09-15 ENCOUNTER — Encounter (HOSPITAL_COMMUNITY): Payer: Self-pay

## 2024-09-15 ENCOUNTER — Emergency Department (HOSPITAL_COMMUNITY)

## 2024-09-15 ENCOUNTER — Inpatient Hospital Stay (HOSPITAL_COMMUNITY)
Admission: EM | Admit: 2024-09-15 | Discharge: 2024-09-27 | DRG: 377 | Disposition: A | Discharge status: Home / Self Care | Attending: Internal Medicine | Admitting: Internal Medicine

## 2024-09-15 ENCOUNTER — Other Ambulatory Visit: Payer: Self-pay

## 2024-09-15 DIAGNOSIS — T45515A Adverse effect of anticoagulants, initial encounter: Secondary | ICD-10-CM | POA: Diagnosis present

## 2024-09-15 DIAGNOSIS — Z86711 Personal history of pulmonary embolism: Secondary | ICD-10-CM

## 2024-09-15 DIAGNOSIS — I6782 Cerebral ischemia: Secondary | ICD-10-CM | POA: Diagnosis present

## 2024-09-15 DIAGNOSIS — R29810 Facial weakness: Secondary | ICD-10-CM | POA: Diagnosis not present

## 2024-09-15 DIAGNOSIS — Z7984 Long term (current) use of oral hypoglycemic drugs: Secondary | ICD-10-CM

## 2024-09-15 DIAGNOSIS — Z811 Family history of alcohol abuse and dependence: Secondary | ICD-10-CM

## 2024-09-15 DIAGNOSIS — Z825 Family history of asthma and other chronic lower respiratory diseases: Secondary | ICD-10-CM

## 2024-09-15 DIAGNOSIS — J9610 Chronic respiratory failure, unspecified whether with hypoxia or hypercapnia: Secondary | ICD-10-CM | POA: Diagnosis present

## 2024-09-15 DIAGNOSIS — K5731 Diverticulosis of large intestine without perforation or abscess with bleeding: Secondary | ICD-10-CM | POA: Diagnosis present

## 2024-09-15 DIAGNOSIS — Z8616 Personal history of COVID-19: Secondary | ICD-10-CM | POA: Diagnosis not present

## 2024-09-15 DIAGNOSIS — J42 Unspecified chronic bronchitis: Secondary | ICD-10-CM | POA: Diagnosis not present

## 2024-09-15 DIAGNOSIS — M4856XA Collapsed vertebra, not elsewhere classified, lumbar region, initial encounter for fracture: Secondary | ICD-10-CM | POA: Diagnosis present

## 2024-09-15 DIAGNOSIS — N179 Acute kidney failure, unspecified: Secondary | ICD-10-CM | POA: Diagnosis present

## 2024-09-15 DIAGNOSIS — D696 Thrombocytopenia, unspecified: Secondary | ICD-10-CM | POA: Diagnosis present

## 2024-09-15 DIAGNOSIS — J4489 Other specified chronic obstructive pulmonary disease: Secondary | ICD-10-CM | POA: Diagnosis present

## 2024-09-15 DIAGNOSIS — Z7901 Long term (current) use of anticoagulants: Secondary | ICD-10-CM | POA: Diagnosis not present

## 2024-09-15 DIAGNOSIS — E1165 Type 2 diabetes mellitus with hyperglycemia: Secondary | ICD-10-CM | POA: Diagnosis present

## 2024-09-15 DIAGNOSIS — K648 Other hemorrhoids: Secondary | ICD-10-CM | POA: Diagnosis present

## 2024-09-15 DIAGNOSIS — K219 Gastro-esophageal reflux disease without esophagitis: Secondary | ICD-10-CM | POA: Diagnosis present

## 2024-09-15 DIAGNOSIS — Z79899 Other long term (current) drug therapy: Secondary | ICD-10-CM

## 2024-09-15 DIAGNOSIS — D62 Acute posthemorrhagic anemia: Secondary | ICD-10-CM | POA: Diagnosis present

## 2024-09-15 DIAGNOSIS — Z813 Family history of other psychoactive substance abuse and dependence: Secondary | ICD-10-CM

## 2024-09-15 DIAGNOSIS — Z8249 Family history of ischemic heart disease and other diseases of the circulatory system: Secondary | ICD-10-CM

## 2024-09-15 DIAGNOSIS — I2510 Atherosclerotic heart disease of native coronary artery without angina pectoris: Secondary | ICD-10-CM | POA: Diagnosis present

## 2024-09-15 DIAGNOSIS — N1831 Chronic kidney disease, stage 3a: Secondary | ICD-10-CM | POA: Diagnosis present

## 2024-09-15 DIAGNOSIS — D6869 Other thrombophilia: Secondary | ICD-10-CM | POA: Diagnosis present

## 2024-09-15 DIAGNOSIS — E66813 Obesity, class 3: Secondary | ICD-10-CM | POA: Diagnosis present

## 2024-09-15 DIAGNOSIS — F32A Depression, unspecified: Secondary | ICD-10-CM | POA: Diagnosis present

## 2024-09-15 DIAGNOSIS — E118 Type 2 diabetes mellitus with unspecified complications: Secondary | ICD-10-CM | POA: Diagnosis not present

## 2024-09-15 DIAGNOSIS — G8324 Monoplegia of upper limb affecting left nondominant side: Secondary | ICD-10-CM | POA: Diagnosis not present

## 2024-09-15 DIAGNOSIS — N281 Cyst of kidney, acquired: Secondary | ICD-10-CM | POA: Diagnosis present

## 2024-09-15 DIAGNOSIS — J4542 Moderate persistent asthma with status asthmaticus: Secondary | ICD-10-CM | POA: Diagnosis not present

## 2024-09-15 DIAGNOSIS — E1122 Type 2 diabetes mellitus with diabetic chronic kidney disease: Secondary | ICD-10-CM | POA: Diagnosis present

## 2024-09-15 DIAGNOSIS — Z881 Allergy status to other antibiotic agents status: Secondary | ICD-10-CM

## 2024-09-15 DIAGNOSIS — K5791 Diverticulosis of intestine, part unspecified, without perforation or abscess with bleeding: Secondary | ICD-10-CM

## 2024-09-15 DIAGNOSIS — Z6841 Body Mass Index (BMI) 40.0 and over, adult: Secondary | ICD-10-CM | POA: Diagnosis not present

## 2024-09-15 DIAGNOSIS — E876 Hypokalemia: Secondary | ICD-10-CM | POA: Diagnosis present

## 2024-09-15 DIAGNOSIS — J4540 Moderate persistent asthma, uncomplicated: Secondary | ICD-10-CM | POA: Diagnosis present

## 2024-09-15 DIAGNOSIS — Z8261 Family history of arthritis: Secondary | ICD-10-CM

## 2024-09-15 DIAGNOSIS — Z832 Family history of diseases of the blood and blood-forming organs and certain disorders involving the immune mechanism: Secondary | ICD-10-CM

## 2024-09-15 DIAGNOSIS — J449 Chronic obstructive pulmonary disease, unspecified: Secondary | ICD-10-CM | POA: Diagnosis present

## 2024-09-15 DIAGNOSIS — K439 Ventral hernia without obstruction or gangrene: Secondary | ICD-10-CM | POA: Diagnosis present

## 2024-09-15 DIAGNOSIS — Z808 Family history of malignant neoplasm of other organs or systems: Secondary | ICD-10-CM

## 2024-09-15 DIAGNOSIS — E538 Deficiency of other specified B group vitamins: Secondary | ICD-10-CM | POA: Diagnosis not present

## 2024-09-15 DIAGNOSIS — R000 Tachycardia, unspecified: Secondary | ICD-10-CM | POA: Diagnosis not present

## 2024-09-15 DIAGNOSIS — E785 Hyperlipidemia, unspecified: Secondary | ICD-10-CM | POA: Diagnosis present

## 2024-09-15 DIAGNOSIS — Z9842 Cataract extraction status, left eye: Secondary | ICD-10-CM

## 2024-09-15 DIAGNOSIS — I129 Hypertensive chronic kidney disease with stage 1 through stage 4 chronic kidney disease, or unspecified chronic kidney disease: Secondary | ICD-10-CM | POA: Diagnosis present

## 2024-09-15 DIAGNOSIS — K625 Hemorrhage of anus and rectum: Principal | ICD-10-CM

## 2024-09-15 DIAGNOSIS — Z888 Allergy status to other drugs, medicaments and biological substances status: Secondary | ICD-10-CM

## 2024-09-15 DIAGNOSIS — F4310 Post-traumatic stress disorder, unspecified: Secondary | ICD-10-CM | POA: Diagnosis present

## 2024-09-15 DIAGNOSIS — R4781 Slurred speech: Secondary | ICD-10-CM | POA: Diagnosis not present

## 2024-09-15 DIAGNOSIS — Z604 Social exclusion and rejection: Secondary | ICD-10-CM | POA: Diagnosis present

## 2024-09-15 DIAGNOSIS — G4733 Obstructive sleep apnea (adult) (pediatric): Secondary | ICD-10-CM | POA: Diagnosis present

## 2024-09-15 DIAGNOSIS — Z87891 Personal history of nicotine dependence: Secondary | ICD-10-CM | POA: Diagnosis not present

## 2024-09-15 DIAGNOSIS — I700 Atherosclerosis of aorta: Secondary | ICD-10-CM | POA: Diagnosis present

## 2024-09-15 DIAGNOSIS — E8809 Other disorders of plasma-protein metabolism, not elsewhere classified: Secondary | ICD-10-CM | POA: Diagnosis not present

## 2024-09-15 DIAGNOSIS — Z9981 Dependence on supplemental oxygen: Secondary | ICD-10-CM

## 2024-09-15 DIAGNOSIS — Z820 Family history of epilepsy and other diseases of the nervous system: Secondary | ICD-10-CM

## 2024-09-15 DIAGNOSIS — L304 Erythema intertrigo: Secondary | ICD-10-CM | POA: Diagnosis not present

## 2024-09-15 DIAGNOSIS — K922 Gastrointestinal hemorrhage, unspecified: Principal | ICD-10-CM | POA: Diagnosis present

## 2024-09-15 DIAGNOSIS — H8110 Benign paroxysmal vertigo, unspecified ear: Secondary | ICD-10-CM | POA: Diagnosis present

## 2024-09-15 DIAGNOSIS — R578 Other shock: Secondary | ICD-10-CM | POA: Diagnosis present

## 2024-09-15 DIAGNOSIS — Z860100 Personal history of colon polyps, unspecified: Secondary | ICD-10-CM

## 2024-09-15 DIAGNOSIS — Z818 Family history of other mental and behavioral disorders: Secondary | ICD-10-CM

## 2024-09-15 DIAGNOSIS — Z9841 Cataract extraction status, right eye: Secondary | ICD-10-CM

## 2024-09-15 DIAGNOSIS — Z833 Family history of diabetes mellitus: Secondary | ICD-10-CM

## 2024-09-15 DIAGNOSIS — I10 Essential (primary) hypertension: Secondary | ICD-10-CM | POA: Diagnosis present

## 2024-09-15 DIAGNOSIS — Z86718 Personal history of other venous thrombosis and embolism: Secondary | ICD-10-CM | POA: Diagnosis not present

## 2024-09-15 DIAGNOSIS — K921 Melena: Secondary | ICD-10-CM | POA: Diagnosis not present

## 2024-09-15 DIAGNOSIS — R29818 Other symptoms and signs involving the nervous system: Secondary | ICD-10-CM | POA: Diagnosis not present

## 2024-09-15 DIAGNOSIS — I252 Old myocardial infarction: Secondary | ICD-10-CM

## 2024-09-15 DIAGNOSIS — E872 Acidosis, unspecified: Secondary | ICD-10-CM | POA: Diagnosis not present

## 2024-09-15 DIAGNOSIS — J189 Pneumonia, unspecified organism: Secondary | ICD-10-CM | POA: Insufficient documentation

## 2024-09-15 DIAGNOSIS — Z955 Presence of coronary angioplasty implant and graft: Secondary | ICD-10-CM

## 2024-09-15 DIAGNOSIS — I25119 Atherosclerotic heart disease of native coronary artery with unspecified angina pectoris: Secondary | ICD-10-CM | POA: Diagnosis not present

## 2024-09-15 DIAGNOSIS — Z803 Family history of malignant neoplasm of breast: Secondary | ICD-10-CM

## 2024-09-15 DIAGNOSIS — K5790 Diverticulosis of intestine, part unspecified, without perforation or abscess without bleeding: Secondary | ICD-10-CM | POA: Diagnosis not present

## 2024-09-15 DIAGNOSIS — G8929 Other chronic pain: Secondary | ICD-10-CM | POA: Diagnosis present

## 2024-09-15 DIAGNOSIS — Z9049 Acquired absence of other specified parts of digestive tract: Secondary | ICD-10-CM

## 2024-09-15 DIAGNOSIS — F419 Anxiety disorder, unspecified: Secondary | ICD-10-CM | POA: Diagnosis present

## 2024-09-15 DIAGNOSIS — Z8701 Personal history of pneumonia (recurrent): Secondary | ICD-10-CM

## 2024-09-15 DIAGNOSIS — R29709 NIHSS score 9: Secondary | ICD-10-CM | POA: Diagnosis not present

## 2024-09-15 DIAGNOSIS — Z90710 Acquired absence of both cervix and uterus: Secondary | ICD-10-CM

## 2024-09-15 DIAGNOSIS — Z87892 Personal history of anaphylaxis: Secondary | ICD-10-CM

## 2024-09-15 LAB — HEMOGLOBIN AND HEMATOCRIT, BLOOD
HCT: 36.9 % (ref 36.0–46.0)
HCT: 36.3 % (ref 36.0–46.0)
HCT: 34.8 % — ABNORMAL LOW (ref 36.0–46.0)
Hemoglobin: 11.2 g/dL — ABNORMAL LOW (ref 12.0–15.0)
Hemoglobin: 11.6 g/dL — ABNORMAL LOW (ref 12.0–15.0)
Hemoglobin: 10.8 g/dL — ABNORMAL LOW (ref 12.0–15.0)

## 2024-09-15 LAB — CBC WITH DIFFERENTIAL/PLATELET
Abs Immature Granulocytes: 0.08 K/uL — ABNORMAL HIGH (ref 0.00–0.07)
Basophils Absolute: 0.1 K/uL (ref 0.0–0.1)
Basophils Relative: 0 %
Eosinophils Absolute: 0.4 K/uL (ref 0.0–0.5)
Eosinophils Relative: 3 %
HCT: 42.7 % (ref 36.0–46.0)
Hemoglobin: 13.3 g/dL (ref 12.0–15.0)
Immature Granulocytes: 1 %
Lymphocytes Relative: 13 %
Lymphs Abs: 1.5 K/uL (ref 0.7–4.0)
MCH: 26.8 pg (ref 26.0–34.0)
MCHC: 31.1 g/dL (ref 30.0–36.0)
MCV: 85.9 fL (ref 80.0–100.0)
Monocytes Absolute: 0.8 K/uL (ref 0.1–1.0)
Monocytes Relative: 6 %
Neutro Abs: 9.3 K/uL — ABNORMAL HIGH (ref 1.7–7.7)
Neutrophils Relative %: 77 %
Platelets: 291 K/uL (ref 150–400)
RBC: 4.97 MIL/uL (ref 3.87–5.11)
RDW: 17.2 % — ABNORMAL HIGH (ref 11.5–15.5)
WBC: 12.1 K/uL — ABNORMAL HIGH (ref 4.0–10.5)
nRBC: 0 % (ref 0.0–0.2)

## 2024-09-15 LAB — COMPREHENSIVE METABOLIC PANEL WITH GFR
ALT: 14 U/L (ref 0–44)
AST: 19 U/L (ref 15–41)
Albumin: 3.8 g/dL (ref 3.5–5.0)
Alkaline Phosphatase: 101 U/L (ref 38–126)
Anion gap: 9 (ref 5–15)
BUN: 31 mg/dL — ABNORMAL HIGH (ref 8–23)
CO2: 24 mmol/L (ref 22–32)
Calcium: 10.4 mg/dL — ABNORMAL HIGH (ref 8.9–10.3)
Chloride: 108 mmol/L (ref 98–111)
Creatinine, Ser: 1.52 mg/dL — ABNORMAL HIGH (ref 0.44–1.00)
GFR, Estimated: 36 mL/min — ABNORMAL LOW
Glucose, Bld: 241 mg/dL — ABNORMAL HIGH (ref 70–99)
Potassium: 4.8 mmol/L (ref 3.5–5.1)
Sodium: 141 mmol/L (ref 135–145)
Total Bilirubin: 0.6 mg/dL (ref 0.0–1.2)
Total Protein: 6.7 g/dL (ref 6.5–8.1)

## 2024-09-15 LAB — PROTIME-INR
INR: 2.1 — ABNORMAL HIGH (ref 0.8–1.2)
Prothrombin Time: 24.3 s — ABNORMAL HIGH (ref 11.4–15.2)

## 2024-09-15 LAB — SURGICAL PCR SCREEN
MRSA, PCR: NEGATIVE
Staphylococcus aureus: POSITIVE — AB

## 2024-09-15 LAB — I-STAT CHEM 8, ED
BUN: 32 mg/dL — ABNORMAL HIGH (ref 8–23)
Calcium, Ion: 1.4 mmol/L (ref 1.15–1.40)
Chloride: 107 mmol/L (ref 98–111)
Creatinine, Ser: 1.6 mg/dL — ABNORMAL HIGH (ref 0.44–1.00)
Glucose, Bld: 229 mg/dL — ABNORMAL HIGH (ref 70–99)
HCT: 42 % (ref 36.0–46.0)
Hemoglobin: 14.3 g/dL (ref 12.0–15.0)
Potassium: 4.7 mmol/L (ref 3.5–5.1)
Sodium: 142 mmol/L (ref 135–145)
TCO2: 24 mmol/L (ref 22–32)

## 2024-09-15 LAB — GLUCOSE, CAPILLARY
Glucose-Capillary: 118 mg/dL — ABNORMAL HIGH (ref 70–99)
Glucose-Capillary: 128 mg/dL — ABNORMAL HIGH (ref 70–99)

## 2024-09-15 LAB — ABO/RH: ABO/RH(D): O POS

## 2024-09-15 MED ORDER — INSULIN ASPART 100 UNIT/ML IJ SOLN
0.0000 [IU] | Freq: Three times a day (TID) | INTRAMUSCULAR | Status: DC
  Administered 2024-09-16: 3 [IU] via SUBCUTANEOUS
  Administered 2024-09-19: 4 [IU] via SUBCUTANEOUS
  Administered 2024-09-19 – 2024-09-20 (×2): 3 [IU] via SUBCUTANEOUS
  Administered 2024-09-20 (×2): 4 [IU] via SUBCUTANEOUS
  Filled 2024-09-15 (×2): qty 3
  Filled 2024-09-15 (×2): qty 4
  Filled 2024-09-15: qty 2
  Filled 2024-09-15: qty 4

## 2024-09-15 MED ORDER — SODIUM CHLORIDE 0.9 % IV SOLN: INTRAVENOUS | Status: AC

## 2024-09-15 MED ORDER — ACETAMINOPHEN 650 MG RE SUPP: 650.0000 mg | Freq: Four times a day (QID) | RECTAL | Status: DC | PRN

## 2024-09-15 MED ORDER — IOHEXOL 300 MG/ML  SOLN
80.0000 mL | Freq: Once | INTRAMUSCULAR | Status: AC | PRN
  Administered 2024-09-15: 80 mL via INTRAVENOUS

## 2024-09-15 MED ORDER — HYDROXYZINE HCL 25 MG PO TABS
25.0000 mg | ORAL_TABLET | Freq: Three times a day (TID) | ORAL | Status: DC | PRN
  Administered 2024-09-15 – 2024-09-26 (×10): 25 mg via ORAL
  Filled 2024-09-15 (×11): qty 1

## 2024-09-15 MED ORDER — MUPIROCIN 2 % EX OINT
1.0000 | TOPICAL_OINTMENT | Freq: Two times a day (BID) | CUTANEOUS | Status: AC
  Administered 2024-09-15 – 2024-09-20 (×9): 1 via NASAL
  Filled 2024-09-15 (×2): qty 22

## 2024-09-15 MED ORDER — ALBUTEROL SULFATE (2.5 MG/3ML) 0.083% IN NEBU: 2.5000 mg | INHALATION_SOLUTION | RESPIRATORY_TRACT | Status: DC | PRN

## 2024-09-15 MED ORDER — ATORVASTATIN CALCIUM 40 MG PO TABS
80.0000 mg | ORAL_TABLET | Freq: Every evening | ORAL | Status: DC
  Administered 2024-09-15 – 2024-09-26 (×12): 80 mg via ORAL
  Filled 2024-09-15 (×12): qty 2

## 2024-09-15 MED ORDER — PANTOPRAZOLE SODIUM 40 MG IV SOLR
40.0000 mg | Freq: Once | INTRAVENOUS | Status: AC
  Administered 2024-09-15: 40 mg via INTRAVENOUS
  Filled 2024-09-15: qty 10

## 2024-09-15 MED ORDER — FAMOTIDINE 20 MG PO TABS
40.0000 mg | ORAL_TABLET | Freq: Every day | ORAL | Status: DC
  Administered 2024-09-15 – 2024-09-26 (×11): 40 mg via ORAL
  Filled 2024-09-15 (×12): qty 2

## 2024-09-15 MED ORDER — ONDANSETRON HCL 4 MG PO TABS
4.0000 mg | ORAL_TABLET | Freq: Four times a day (QID) | ORAL | Status: DC | PRN
  Administered 2024-09-27: 4 mg via ORAL
  Filled 2024-09-15: qty 1

## 2024-09-15 MED ORDER — VITAMIN B-12 1000 MCG PO TABS
5000.0000 ug | ORAL_TABLET | Freq: Every day | ORAL | Status: DC
  Administered 2024-09-15 – 2024-09-27 (×11): 5000 ug via ORAL
  Filled 2024-09-15 (×12): qty 5

## 2024-09-15 MED ORDER — ACETAMINOPHEN 325 MG PO TABS
650.0000 mg | ORAL_TABLET | Freq: Four times a day (QID) | ORAL | Status: DC | PRN
  Administered 2024-09-19 – 2024-09-25 (×3): 650 mg via ORAL
  Filled 2024-09-15 (×3): qty 2

## 2024-09-15 MED ORDER — AMITRIPTYLINE HCL 25 MG PO TABS
75.0000 mg | ORAL_TABLET | Freq: Every day | ORAL | Status: DC
  Administered 2024-09-15 – 2024-09-26 (×11): 75 mg via ORAL
  Filled 2024-09-15 (×12): qty 3

## 2024-09-15 MED ORDER — DICYCLOMINE HCL 20 MG PO TABS
20.0000 mg | ORAL_TABLET | Freq: Four times a day (QID) | ORAL | Status: DC | PRN
  Administered 2024-09-20 – 2024-09-23 (×2): 20 mg via ORAL
  Filled 2024-09-15 (×3): qty 1

## 2024-09-15 MED ORDER — CHLORHEXIDINE GLUCONATE CLOTH 2 % EX PADS
6.0000 | MEDICATED_PAD | Freq: Every day | CUTANEOUS | Status: DC
  Administered 2024-09-16 – 2024-09-19 (×4): 6 via TOPICAL

## 2024-09-15 MED ORDER — SODIUM CHLORIDE 0.9 % IV BOLUS
1000.0000 mL | Freq: Once | INTRAVENOUS | Status: AC
  Administered 2024-09-15: 1000 mL via INTRAVENOUS

## 2024-09-15 MED ORDER — ONDANSETRON HCL 4 MG/2ML IJ SOLN
4.0000 mg | Freq: Four times a day (QID) | INTRAMUSCULAR | Status: DC | PRN
  Administered 2024-09-16: 4 mg via INTRAVENOUS
  Filled 2024-09-15 (×2): qty 2
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
## 2024-09-15 NOTE — H&P (Signed)
 " History and Physical    Patient: Mary Ferguson FMW:[id number] DOB: 03-26-1951 DOA: 09/15/2024 DOS: the patient was seen and examined on 09/15/2024 PCP: Catherine Charlies LABOR, DO  Patient coming from: Home  Chief Complaint:  Chief Complaint  Patient presents with   Rectal Bleeding   HPI: [name] is a 74 y.o. 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  He denied fever, chills, rhinorrhea, sore throat, wheezing or hemoptysis.  No chest pain, palpitations, diaphoresis, PND, orthopnea or pitting edema of the lower extremities.  No flank pain, dysuria, frequency or hematuria.  No polyuria, polydipsia, polyphagia or blurred vision.   Lab work: CBC showed a white count of 12.1, hemoglobin 13.3 g/dL and platelets 708.  PT was 24.1 and INR 2.1.  CMP showed a glucose of 241, BUN 31, creatinine 1.52 and calcium  10.4 mg/dL.  The rest of the electrolytes and hepatic functions were normal.  Imaging: Portable 1 view chest radiograph with no active disease.  CT abdomen/pelvis with contrast showed a crescentic hyperdensity over the rectosigmoid junction in the midline posterior pelvis measuring 7 x 14 mm possibly a focus of acute hemorrhage.  Punctate hyperdensity within the rectum slightly more inferiorly.  Mild diverticulosis throughout the colon without active inflammation.  Mild fecal retention over the right colon with stool in fluid-filled rectosigmoid and the left colon.  Unchanged renal cyst.  Aortic  atherosclerosis.  Atherosclerotic coronary artery disease.  Stable L1 fracture.  ED course: Initial vital signs were temperature 98 F, pulse 77, respiration 15, BP 129/81 mmHg and O2 sat 100% on room air.  The patient received pantoprazole  40 mg IVP.   Review of Systems: As mentioned in the history of present illness. All other systems reviewed and are negative. Past Medical History:  Diagnosis Date   Abdominal wall hernia 01/27/2014   Formatting of this note might be different from the original.  With incarceration. 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     Acute and chronic respiratory failure with hypoxia (HCC) 01/24/2018   Acute diverticulitis 07/20/2022   Acute kidney injury superimposed on chronic kidney disease 07/21/2022   Asthma    Blood in stool    Chicken pox    Chronic generalized abdominal pain    Chronic pain    CKD (chronic kidney disease), stage III (HCC)    Colon polyps    Compression fracture of L1 lumbar vertebra (HCC)    COPD (chronic obstructive pulmonary disease) (HCC)    COVID-19 2020   Depression    Diverticulitis    Very severe.  Has had 2 bowel resections and temporary colostomy in the past.   Fall at home, initial encounter 07/21/2022   Fecal incontinence    Frequent headaches    GERD (gastroesophageal reflux disease)    Heart attack (HCC)    Heart disease    High anion gap metabolic acidosis 07/21/2022   History of blood transfusion    Hyperlipemia    Hypertension    Incontinence of feces 12/11/2016   Pneumonia due to infectious organism 01/12/2018   Postmenopausal 03/21/2021   PTSD (post-traumatic stress disorder)    Recurrent deep  vein thrombosis (DVT) (HCC)    x6   Rhabdomyolysis 07/21/2022   Urinary incontinence    Past Surgical History:  Procedure Laterality Date   ABDOMINAL HYSTERECTOMY  1995   APPENDECTOMY     CATARACT EXTRACTION Bilateral    CESAREAN SECTION  1984   CHOLECYSTECTOMY  2016   COLOSTOMY     and reversal   CORONARY STENT  INTERVENTION N/A 06/18/2019   Procedure: CORONARY STENT INTERVENTION;  Surgeon: Darron Deatrice LABOR, MD;  Location: [facility];  Service: Cardiovascular;  Laterality: N/A;   ENDOMETRIAL ABLATION     HERNIA REPAIR  1985   1985 and 2016 (2016 ventral w/ incarceration) 33 surgeries   HYSTEROSCOPY WITH D & C     13   LAPAROSCOPIC LYSIS OF ADHESIONS     x2   LEFT HEART CATH AND CORONARY ANGIOGRAPHY N/A 06/18/2019   Procedure: LEFT HEART CATH AND CORONARY ANGIOGRAPHY;  Surgeon: Darron Deatrice LABOR, MD;  Location: [facility];  Service: Cardiovascular;  Laterality: N/A;   LEFT HEART CATH AND CORONARY ANGIOGRAPHY N/A 11/30/2019   Procedure: LEFT HEART CATH AND CORONARY ANGIOGRAPHY;  Surgeon: Burnard Debby LABOR, MD;  Location: [facility];  Service: Cardiovascular;  Laterality: N/A;   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 She has never used smokeless tobacco. She reports that she does not currently use alcohol. She reports that she does not use drugs.  Allergies[1]  Family History  Problem Relation Age of Onset   Depression Mother    Drug abuse Mother    Mental illness Mother    Learning disabilities Mother    Miscarriages / Stillbirths Mother    Alcohol abuse Mother    Mental illness Father    Depression Father    Heart disease Father    Drug abuse Sister    Depression Sister    Asthma Sister    Alcohol abuse Sister    Early death Sister    Mental illness Son    Hypertension Son    Drug abuse Son    Depression Son    Asthma Son    Alcohol abuse Son    Clotting disorder Son    Heart disease Maternal Grandmother    Diabetes Maternal Grandmother    Arthritis Maternal Grandmother    COPD Maternal Grandmother    Parkinson's disease Maternal Grandmother    Heart disease Maternal Grandfather    Heart attack Maternal Grandfather    Early death  Maternal Grandfather    COPD Maternal Grandfather    Heart disease Paternal Grandmother    Heart attack Paternal Grandmother    Depression Paternal Grandmother    Arthritis Paternal Grandmother    Breast cancer Paternal Grandmother    Heart disease Paternal Grandfather    Heart attack Paternal Grandfather    Early death Paternal Grandfather    COPD Paternal Grandfather    Mental illness Half-Brother    Learning disabilities Half-Brother    Hypertension Half-Brother    Hyperlipidemia Half-Brother    Drug abuse Half-Brother    Heart disease Half-Brother    Diabetes Half-Brother    Depression Half-Brother    Asthma Half-Brother    Alcohol abuse Half-Brother    Arthritis Half-Brother    Kidney disease Half-Brother    Testicular cancer Half-Brother    Diabetes  Half-Sister    Mental illness Half-Sister    Learning disabilities Half-Sister    Drug abuse Half-Sister    Depression Half-Sister    Alcohol abuse Half-Sister    Thyroid  cancer Half-Sister     Prior to Admission medications  Medication Sig Start Date End Date Taking? Authorizing Provider  acetaminophen  (TYLENOL ) 500 MG tablet Take 500-1,000 mg by mouth as needed for mild pain or moderate pain.    [provider]  albuterol  (VENTOLIN  HFA) 108 (90 Base) MCG/ACT inhaler Inhale 2 puffs into the lungs every 4 (four) hours as needed for wheezing or shortness of breath. 07/06/24   Kuneff, Renee A, DO  amitriptyline  (ELAVIL ) 75 MG tablet Take 1 tablet (75 mg total) by mouth at bedtime. 07/06/24   Kuneff, Renee A, DO  atorvastatin  (LIPITOR ) 80 MG tablet Take 1 tablet (80 mg total) by mouth every evening. 10/07/23   Croitoru, Mihai, MD  carvedilol  (COREG ) 6.25 MG tablet TAKE 1 TABLET BY MOUTH EVERY MORNING AND 2 TABLETS EVERY EVENING. 09/01/24   Croitoru, Mihai, MD  Cholecalciferol (VITAMIN D3) 50 MCG (2000 UT) capsule Take 2 capsules (4,000 Units total) by mouth daily. 07/06/24   Kuneff, Renee A, DO  clotrimazole  (LOTRIMIN ) 1  % cream Apply 1 Application topically 2 (two) times daily. 07/06/24   Kuneff, Renee A, DO  dicyclomine  (BENTYL ) 20 MG tablet Take 1 tablet (20 mg total) by mouth 4 (four) times daily as needed for spasms. 07/06/24   Kuneff, Renee A, DO  empagliflozin  (JARDIANCE ) 10 MG TABS tablet Take 1 tablet (10 mg total) by mouth daily before breakfast. 07/06/24   Kuneff, Renee A, DO  famotidine  (PEPCID ) 40 MG tablet Take 1 tablet (40 mg total) by mouth at bedtime. 07/06/24   Kuneff, Renee A, DO  furosemide  (LASIX ) 20 MG tablet Take 1 tablet (20 mg total) by mouth daily as needed. 07/06/24   Kuneff, Renee A, DO  hydrOXYzine  (ATARAX ) 25 MG tablet Take 1 tablet (25 mg total) by mouth every 8 (eight) hours as needed for itching. 07/06/24   Kuneff, Renee A, DO  ipratropium-albuterol  (DUONEB) 0.5-2.5 (3) MG/3ML SOLN Take 3 mLs by nebulization every 4 (four) hours as needed. Patient not taking: Reported on 09/02/2024 08/09/22   Kuneff, Renee A, DO  isosorbide  mononitrate (IMDUR ) 60 MG 24 hr tablet TAKE 1 TABLET(60 MG) BY MOUTH DAILY 10/07/23   Croitoru, Mihai, MD  MAGnesium -Oxide 400 (240 Mg) MG tablet Take 1 tablet (400 mg total) by mouth 2 (two) times daily. 07/06/24   Kuneff, Renee A, DO  nitroGLYCERIN  (NITROSTAT ) 0.4 MG SL tablet Place 1 tablet (0.4 mg total) under the tongue every 5 (five) minutes as needed for chest pain. 07/10/23   Croitoru, Mihai, MD  ondansetron  (ZOFRAN ) 4 MG tablet Take 1 tablet (4 mg total) by mouth every 8 (eight) hours as needed for nausea or vomiting. Patient not taking: Reported on 09/02/2024 07/06/24   Kuneff, Renee A, DO  psyllium (HYDROCIL/METAMUCIL) 95 % PACK Take 1 packet by mouth daily. 07/26/22   Odell Celinda Balo, MD  rivaroxaban  (XARELTO ) 20 MG TABS tablet Take 1 tablet (20 mg total) by mouth daily with supper. 09/02/24   Francyne Headland, MD    Physical Exam: Vitals:   09/15/24 0837 09/15/24 0900 09/15/24 0930 09/15/24 1109  BP: 99/72 (!) 116/91 (!) 138/93 122/66  Pulse: 73 81 77  67  Resp: 17 (!) 22 15 14   Temp:      SpO2: 99% 100% 97% 99%  Weight:      Height:       Physical Exam Vitals and nursing note reviewed.  Constitutional:      General: She is awake. She is not in acute distress.    Appearance: Normal appearance. She is ill-appearing.  HENT:     Head: Normocephalic.     Nose: No rhinorrhea.     Mouth/Throat:     Mouth: Mucous membranes are moist.  Eyes:     General: No scleral icterus.    Pupils: Pupils are equal, round, and reactive to light.  Neck:     Vascular: No JVD.  Cardiovascular:     Rate and Rhythm: Normal rate and regular rhythm.     Heart sounds: S1 normal and S2 normal.  Pulmonary:     Effort: Pulmonary effort is normal.     Breath sounds: Normal breath sounds. No wheezing, rhonchi or rales.  Abdominal:     General: Bowel sounds are normal. There is no distension.     Palpations: Abdomen is soft.     Tenderness: There is no abdominal tenderness. There is no right CVA tenderness or left CVA tenderness.  Musculoskeletal:     Cervical back: Neck supple.     Right lower leg: No edema.     Left lower leg: No edema.  Skin:    General: Skin is warm and dry.  Neurological:     General: No focal deficit present.     Mental Status: She is alert and oriented to person, place, and time.  Psychiatric:        Mood and Affect: Mood normal.        Behavior: Behavior normal. Behavior is cooperative.     Data Reviewed:  Results are pending, will review when available.  Assessment and Plan: Principal Problem:   Acute lower GI bleeding With associated:   Acute blood loss anemia (ABLA)  Admit to stepdown/inpatient. Keep NPO for now. Continue IV fluids. Continue pantoprazole  infusion. Continue octreotide infusion. Monitor H&H. Transfuse as needed. GI consult  appreciated.  Active Problems:   Essential hypertension Hold antihypertensives for now.    CAD S/P PCI Continue statin. Hold beta-blocker, long-acting nitrates and  DOAC.    Aortic atherosclerosis   Dyslipidemia, goal LDL below 70 Continue atorvastatin  80 mg p.o. daily.    Obstructive sleep apnea (adult) (pediatric) May use CPAP at bedtime.    Moderate persistent asthma   COPD (chronic obstructive pulmonary disease) (HCC) Continue albuterol  MDI.    B12 deficiency Continue B12 supplementation.    History of recurrent deep vein thrombosis (DVT) Xarelto  on hold.    Type II diabetes mellitus with manifestations (HCC) Carbohydrate modified diet. CBG monitoring with [facility] while in the hospital. Check hemoglobin A1c.    Morbid obesity (HCC)   BMI 45.0-49.9, adult (HCC) Current BMI 46.77 kg/m. Would benefit from lifestyle modifications. Follow-up closely with PCP and/or bariatric clinic.    Advance Care Planning:   Code Status: Full Code   Consults: [facility] Charna Keas, DO).  Family Communication:   Severity of Illness: The appropriate patient status for this patient is INPATIENT. Inpatient status is judged to be reasonable and necessary in order to provide the required intensity of service to ensure the patient's safety. The patient's presenting symptoms, physical exam findings, and initial radiographic and laboratory data in the context of their chronic comorbidities is felt to place them at high risk for further clinical deterioration. Furthermore, it is not anticipated that the  patient will be medically stable for discharge from the hospital within 2 midnights of admission.   * I certify that at the point of admission it is my clinical judgment that the patient will require inpatient hospital care spanning beyond 2 midnights from the point of admission due to high intensity of service, high risk for further deterioration and high frequency of surveillance required.*  Author: Alm Dorn Castor, MD 09/15/2024 11:31 AM  For on call review [email or web address].   This document was prepared using Dragon voice recognition software and may  contain some unintended transcription errors.     [1]  Allergies Allergen Reactions   Rofecoxib Anaphylaxis    VIOXX   Metformin  And Related Other (See Comments)    Ibs    Sulfonylureas Other (See Comments)    Dizziness   Benadryl Allergy [Diphenhydramine Hcl] Other (See Comments)    Depressed respirations   Cephalexin Rash   Gatifloxacin Other (See Comments)    Confusion Very low blood pressure [place]   "

## 2024-09-15 NOTE — Progress Notes (Unsigned)
 "  Cardiology Office Note    Date:  09/15/2024  ID:  Mary Ferguson, DOB 1951-05-20, MRN [id number] PCP:  Catherine Charlies LABOR, DO  Cardiologist:  None  Electrophysiologist:  None   Chief Complaint: ***  History of Present Illness: .    Mary Ferguson is a 74 y.o. female with visit-pertinent history of ***  Labwork independently reviewed:   ROS: .   *** denies chest pain, shortness of breath, lower extremity edema, fatigue, palpitations, melena, hematuria, hemoptysis, diaphoresis, weakness, presyncope, syncope, orthopnea, and PND.  All other systems are reviewed and otherwise negative.  Studies Reviewed: SABRA    EKG:  EKG is ordered today, personally reviewed, demonstrating ***     CV Studies: Cardiac studies reviewed are outlined and summarized above. Otherwise please see EMR for full report. Cardiac Studies & Procedures   ______________________________________________________________________________________________ CARDIAC CATHETERIZATION  CARDIAC CATHETERIZATION 11/30/2019  Conclusion  Previously placed Prox RCA to Mid RCA stent (unknown type) is widely patent.  Previously placed Dist RCA stent (unknown type) is widely patent.  Prox Cx to Mid Cx lesion is 30% stenosed.  Prox LAD lesion is 30% stenosed.  Mid LAD-1 lesion is 40% stenosed.  Mid LAD-2 lesion is 70% stenosed.  Dist LAD lesion is 40% stenosed.  Widely patent mid and distal RCA stents in the dominant RCA vessel.  Mild to moderate concomitant CAD with 30 and 40% proximal LAD stenoses, no change in the previously noted focal 70% mid LAD stenosis with 40% narrowing beyond this segment; normal small ramus intermediate vessel and tortuous circumflex vessel with 30% proximal narrowing.  LVEDP 8 mmHg  RECOMMENDATION: Continue Plavix  and resume Xarelto  tomorrow.  Will DC aspirin .  Plan increase medical therapy trial with titration of carvedilol  to 6.25 mg twice a day and isosorbide  to 60 mg daily.  Aggressive lipid-lowering  therapy with target LDL less than 70.  Optimal blood pressure control with target blood pressure less than 130/80 and ideal blood pressure less than 120/80.  Patient will follow up w  ith Dr. Francyne.  Findings Coronary Findings Diagnostic  Dominance: Right  Left Anterior Descending Prox LAD lesion is 30% stenosed. Mid LAD-1 lesion is 40% stenosed. Mid LAD-2 lesion is 70% stenosed. Dist LAD lesion is 40% stenosed.  First Diagonal Branch  Left Circumflex Prox Cx to Mid Cx lesion is 30% stenosed.  Right Coronary Artery Previously placed Prox RCA to Mid RCA stent (unknown type) is widely patent. Previously placed Dist RCA stent (unknown type) is widely patent.  Intervention  No interventions have been documented.   CARDIAC CATHETERIZATION  CARDIAC CATHETERIZATION 06/18/2019  Conclusion  There is mild left ventricular systolic dysfunction.  LV end diastolic pressure is mildly elevated.  The left ventricular ejection fraction is 35-45% by visual estimate.  Mid RCA lesion is 100% stenosed.  Post intervention, there is a 0% residual stenosis.  A drug-eluting stent was successfully placed using a STENT RESOLUTE ONYX 3.5X26.  Dist RCA lesion is 80% stenosed.  Post intervention, there is a 0% residual stenosis.  A drug-eluting stent was successfully placed using a STENT RESOLUTE ONYX 3.0X18.  Mid LAD lesion is 70% stenosed.  Prox LAD to Mid LAD lesion is 20% stenosed.  Prox Cx lesion is 30% stenosed.  3rd RPL lesion is 60% stenosed.  1.  Significant two-vessel coronary artery disease.  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  There is also significant stenosis in the distal RCA.  There is 70%  stenosis in the mid LAD which appears to be chronic. 2.  Mildly reduced LV systolic function with an EF of 40% with inferior wall hypokinesis.  LVEDP was 22 mmHg. 3.  Successful PCI and drug-eluting stent placement to the mid as  well as distal right coronary artery.  Recommendations: If the patient has a strong indication for continued long-term anticoagulation with Xarelto  then I recommend resuming Xarelto  tomorrow with clopidogrel  monotherapy without aspirin  to minimize the risk of bleeding. If Xarelto  can be discontinued, then the patient should be discharged on dual antiplatelet therapy. Recommend aggressive treatment of risk factors. LAD stenosis can likely be managed medically but if she has residual angina, PCI can be performed in the future.  Findings Coronary Findings Diagnostic  Dominance: Right  Left Main Vessel is angiographically normal.  Left Anterior Descending Prox LAD to Mid LAD lesion is 20% stenosed. Mid LAD lesion is 70% stenosed.  First Diagonal Branch Vessel is angiographically normal.  Second Diagonal Branch Vessel is angiographically normal.  Left Circumflex Prox Cx lesion is 30% stenosed.  Right Coronary Artery Mid RCA lesion is 100% stenosed. Vessel is the culprit lesion. The lesion is type C. The lesion was not previously treated. Dist RCA lesion is 80% stenosed. The lesion is not complex (non high-C). The stenosis was measured by a visual reading.  Third Right Posterolateral Branch Collaterals 3rd RPL filled by collaterals from Dist Cx.  3rd RPL lesion is 60% stenosed.  Intervention  Mid RCA lesion Stent Lesion length:  22 mm. CATHETER LAUNCHER 6FR JR4 guide catheter was inserted. Lesion crossed with guidewire using a WIRE RUNTHROUGH .[id number]. Pre-stent angioplasty was performed using a BALLOON SAPPHIRE 2.5X12. Maximum pressure:  8 atm. Inflation time:  20 sec. A drug-eluting stent was successfully placed using a STENT RESOLUTE ONYX 3.5X26. Maximum pressure: 12 atm. Inflation time: 15 sec. Post-stent angioplasty was performed using a BALLOON SAPPHIRE  [id number]. Maximum pressure:  12 atm. Inflation time:  15 sec. Post-Intervention Lesion Assessment The intervention  was successful. Pre-interventional TIMI flow is 0. Post-intervention TIMI flow is 3. No complications occurred at this lesion. Pressure gradient/FFR was not measured. SABRAUltrasound (IVUS) was not performed on the lesion. There is a 0% residual stenosis post intervention.  Dist RCA lesion Stent Pre-stent angioplasty was performed using a BALLOON SAPPHIRE 2.5X12. Maximum pressure:  8 atm. Inflation time:  20 sec. A drug-eluting stent was successfully placed using a STENT RESOLUTE ONYX 3.0X18. Maximum pressure: 14 atm. Inflation time: 15 sec. Stent does not overlap previously placed stentPost-stent angioplasty was not performed. Post-Intervention Lesion Assessment The intervention was successful. Pre-interventional TIMI flow is 3. Post-intervention TIMI flow is 3. No complications occurred at this lesion. There is a 0% residual stenosis post intervention.     ECHOCARDIOGRAM  ECHOCARDIOGRAM COMPLETE 06/18/2019  Narrative ECHOCARDIOGRAM REPORT    Patient Name:   VIHANA KYDD Date of Exam: 06/18/2019 Medical Rec #:  [id number]   Height:       63.0 in Accession #:    [id number]  Weight:       257.4 lb Date of Birth:  Jan 26, 1951   BSA:          2.15 m Patient Age:    68 years    BP:           112/97 mmHg Patient Gender: F           HR:           69 bpm. Exam Location:  Inpatient  Procedure: 2D Echo  Indications:    chest pain 786.50  History:        Patient has no prior history of Echocardiogram examinations.  Sonographer:    Tinnie Barefoot Referring Phys: [id number] LINDSAY B ROBERTS   Sonographer Comments: Image acquisition challenging due to patient body habitus. IMPRESSIONS   1. Left ventricular ejection fraction, by visual estimation, is 60 to 65%. The left ventricle has normal function. Normal left ventricular size. There is severely increased left ventricular hypertrophy. The left ventricular hypertrophy involves basal-septum walls. 2. Left ventricular diastolic Doppler  parameters are consistent with impaired relaxation pattern of LV diastolic filling. 3. Global right ventricle has normal systolic function.The right ventricular size is normal. No increase in right ventricular wall thickness. 4. Left atrial size was normal. 5. Right atrial size was normal. 6. Mild to moderate mitral annular calcification. No evidence of mitral valve regurgitation. No evidence of mitral stenosis. 7. The tricuspid valve is normal in structure. Tricuspid valve regurgitation was not visualized by color flow Doppler. 8. The aortic valve is tricuspid Aortic valve regurgitation was not visualized by color flow Doppler. Mild aortic valve sclerosis without stenosis. 9. The pulmonic valve was normal in structure. Pulmonic valve regurgitation is not visualized by color flow Doppler. 10. The inferior vena cava is normal in size with greater than 50% respiratory variability, suggesting right atrial pressure of 3 mmHg.  FINDINGS Left Ventricle: Left ventricular ejection fraction, by visual estimation, is 60 to 65%. The left ventricle has normal function. There is severely increased left ventricular hypertrophy. The left ventricular hypertrophy involves basal-septum walls. Normal left ventricular size. Spectral Doppler shows Left ventricular diastolic Doppler parameters are consistent with impaired relaxation pattern of LV diastolic filling. Normal left ventricular filling pressures.  Right Ventricle: The right ventricular size is normal. No increase in right ventricular wall thickness. Global RV systolic function is has normal systolic function.  Left Atrium: Left atrial size was normal in size.  Right Atrium: Right atrial size was normal in size  Pericardium: There is no evidence of pericardial effusion.  Mitral Valve: The mitral valve is normal in structure. Mild to moderate mitral annular calcification. No evidence of mitral valve stenosis by observation. No evidence of mitral valve  regurgitation.  Tricuspid Valve: The tricuspid valve is normal in structure. Tricuspid valve regurgitation was not visualized by color flow Doppler.  Aortic Valve: The aortic valve is tricuspid. Aortic valve regurgitation was not visualized by color flow Doppler. Mild aortic valve sclerosis is present, with no evidence of aortic valve stenosis.  Pulmonic Valve: The pulmonic valve was normal in structure. Pulmonic valve regurgitation is not visualized by color flow Doppler.  Aorta: The aortic root, ascending aorta and aortic arch are all structurally normal, with no evidence of dilitation or obstruction.  Venous: The inferior vena cava is normal in size with greater than 50% respiratory variability, suggesting right atrial pressure of 3 mmHg.  IAS/Shunts: No atrial level shunt detected by color flow Doppler. No ventricular septal defect is seen or detected. There is no evidence of an atrial septal defect.    LEFT VENTRICLE PLAX 2D LVIDd:         4.70 cm  Diastology LVIDs:         2.80 cm  LV e' lateral:   8.81 cm/s LV PW:         1.20 cm  LV E/e' lateral: 9.9 LV IVS:        1.00 cm  LV e' medial:  6.42 cm/s LVOT diam:     2.00 cm  LV E/e' medial:  13.6 LV SV:         73 ml LV SV Index:   31.03 LVOT Area:     3.14 cm   RIGHT VENTRICLE RV S prime:     17.10 cm/s TAPSE (M-mode): 1.7 cm  LEFT ATRIUM             Index       RIGHT ATRIUM          Index LA diam:        4.90 cm 2.28 cm/m  RA Area:     9.10 cm LA Vol (A2C):   47.2 ml 21.93 ml/m RA Volume:   16.70 ml 7.76 ml/m LA Vol (A4C):   53.2 ml 24.72 ml/m LA Biplane Vol: 50.5 ml 23.46 ml/m AORTIC VALVE LVOT Vmax:   111.00 cm/s LVOT Vmean:  70.600 cm/s LVOT VTI:    0.274 m  AORTA Ao Root diam: 3.40 cm  MITRAL VALVE MV Area (PHT): 3.51 cm              SHUNTS MV PHT:        62.64 msec            Systemic VTI:  0.27 m MV Decel Time: 216 msec              Systemic Diam: 2.00 cm MV E velocity: 87.00 cm/s  103 cm/s MV  A velocity: 105.00 cm/s 70.3 cm/s MV E/A ratio:  0.83        1.5   Wilbert Bihari MD Electronically signed by Wilbert Bihari MD Signature Date/Time: 06/19/2019/6:50:40 PM    Final          ______________________________________________________________________________________________       Current Reported Medications:.    Active Medications[1]  Physical Exam:    VS:  There were no vitals taken for this visit.   Wt Readings from Last 3 Encounters:  09/15/24 255 lb 11.7 oz (116 kg)  07/06/24 256 lb 12.8 oz (116.5 kg)  01/28/24 247 lb 3.2 oz (112.1 kg)    GEN: Well nourished, well developed in no acute distress NECK: No JVD; No carotid bruits CARDIAC: ***RRR, no murmurs, rubs, gallops RESPIRATORY:  Clear to auscultation without rales, wheezing or rhonchi  ABDOMEN: Soft, non-tender, non-distended EXTREMITIES:  No edema; No acute deformity     Asessement and Plan:.     ***     Disposition: F/u with ***  Signed, Collier Bohnet D [employer], NP      [1]  No outpatient medications have been marked as taking for the 09/16/24 encounter (Appointment) with Crimson Beer D, NP.   "
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
## 2024-09-15 NOTE — ED Notes (Signed)
 Gave patient ice chips and water per request

## 2024-09-15 NOTE — ED Triage Notes (Signed)
 Patient BIB [facility] from home. This morning at 5am began having bright red rectal bleeding. Takes Xarelto . Feels dizzy when standing. Has generalized abdominal pain.

## 2024-09-15 NOTE — ED Notes (Signed)
 Hb collected and sent to lab for processing

## 2024-09-15 NOTE — ED Provider Notes (Signed)
 " [place] EMERGENCY DEPARTMENT AT [facility] Provider Note   CSN: [id number] Arrival date & time: 09/15/24  9243     Patient presents with: Rectal Bleeding   Mary Ferguson is a 74 y.o. female.   HPI 74 year old female reports history of DVT, MI, hypertension, CKD, record notes Xarelto  acquired thrombophilia, presents today with rectal bleeding.  She reports that she was in her usual state of health last night.  This morning around 5 she woke up with blood clots from her rectum.  She had 2 episodes of blood and blood clots with no stool.  She denies any significant abdominal pain from baseline but states that she has ongoing pain from her previous surgeries, hernia, diverticulitis    Prior to Admission medications  Medication Sig Start Date End Date Taking? Authorizing Provider  acetaminophen  (TYLENOL ) 500 MG tablet Take 500-1,000 mg by mouth as needed for mild pain or moderate pain.    [provider]  albuterol  (VENTOLIN  HFA) 108 (90 Base) MCG/ACT inhaler Inhale 2 puffs into the lungs every 4 (four) hours as needed for wheezing or shortness of breath. 07/06/24   Kuneff, Renee A, DO  amitriptyline  (ELAVIL ) 75 MG tablet Take 1 tablet (75 mg total) by mouth at bedtime. 07/06/24   Kuneff, Renee A, DO  atorvastatin  (LIPITOR ) 80 MG tablet Take 1 tablet (80 mg total) by mouth every evening. 10/07/23   Croitoru, Mihai, MD  carvedilol  (COREG ) 6.25 MG tablet TAKE 1 TABLET BY MOUTH EVERY MORNING AND 2 TABLETS EVERY EVENING. 09/01/24   Croitoru, Mihai, MD  Cholecalciferol (VITAMIN D3) 50 MCG (2000 UT) capsule Take 2 capsules (4,000 Units total) by mouth daily. 07/06/24   Kuneff, Renee A, DO  clotrimazole  (LOTRIMIN ) 1 % cream Apply 1 Application topically 2 (two) times daily. 07/06/24   Kuneff, Renee A, DO  dicyclomine  (BENTYL ) 20 MG tablet Take 1 tablet (20 mg total) by mouth 4 (four) times daily as needed for spasms. 07/06/24   Kuneff, Renee A, DO  empagliflozin  (JARDIANCE ) 10 MG  TABS tablet Take 1 tablet (10 mg total) by mouth daily before breakfast. 07/06/24   Kuneff, Renee A, DO  famotidine  (PEPCID ) 40 MG tablet Take 1 tablet (40 mg total) by mouth at bedtime. 07/06/24   Kuneff, Renee A, DO  furosemide  (LASIX ) 20 MG tablet Take 1 tablet (20 mg total) by mouth daily as needed. 07/06/24   Kuneff, Renee A, DO  glucose blood (TRUE METRIX BLOOD GLUCOSE TEST) test strip Use as instructed 08/07/22   Kuneff, Renee A, DO  hydrOXYzine  (ATARAX ) 25 MG tablet Take 1 tablet (25 mg total) by mouth every 8 (eight) hours as needed for itching. 07/06/24   Kuneff, Renee A, DO  ipratropium-albuterol  (DUONEB) 0.5-2.5 (3) MG/3ML SOLN Take 3 mLs by nebulization every 4 (four) hours as needed. Patient not taking: Reported on 09/02/2024 08/09/22   Kuneff, Renee A, DO  isosorbide  mononitrate (IMDUR ) 60 MG 24 hr tablet TAKE 1 TABLET(60 MG) BY MOUTH DAILY 10/07/23   Croitoru, Mihai, MD  MAGnesium -Oxide 400 (240 Mg) MG tablet Take 1 tablet (400 mg total) by mouth 2 (two) times daily. 07/06/24   Kuneff, Renee A, DO  nitroGLYCERIN  (NITROSTAT ) 0.4 MG SL tablet Place 1 tablet (0.4 mg total) under the tongue every 5 (five) minutes as needed for chest pain. 07/10/23   Croitoru, Mihai, MD  ondansetron  (ZOFRAN ) 4 MG tablet Take 1 tablet (4 mg total) by mouth every 8 (eight) hours as needed for nausea or  vomiting. Patient not taking: Reported on 09/02/2024 07/06/24   Kuneff, Renee A, DO  psyllium (HYDROCIL/METAMUCIL) 95 % PACK Take 1 packet by mouth daily. 07/26/22   Odell Celinda Balo, MD  rivaroxaban  (XARELTO ) 20 MG TABS tablet Take 1 tablet (20 mg total) by mouth daily with supper. 09/02/24   Croitoru, Mihai, MD    Allergies: Rofecoxib, Metformin  and related, Sulfonylureas, Benadryl allergy [diphenhydramine hcl], Cephalexin, and Gatifloxacin    Review of Systems  Updated Vital Signs BP (!) 138/93   Pulse 77   Temp 98 F (36.7 C)   Resp 15   Ht 1.575 m (5' 2)   Wt 116 kg   SpO2 97%   BMI 46.77 kg/m    Physical Exam Vitals and nursing note reviewed.  Constitutional:      General: She is not in acute distress.    Appearance: Normal appearance.  HENT:     Head: Normocephalic.     Right Ear: External ear normal.     Left Ear: External ear normal.     Nose: Nose normal.     Mouth/Throat:     Mouth: Mucous membranes are moist.     Pharynx: Oropharynx is clear.  Eyes:     Extraocular Movements: Extraocular movements intact.     Conjunctiva/sclera: Conjunctivae normal.     Pupils: Pupils are equal, round, and reactive to light.  Cardiovascular:     Rate and Rhythm: Normal rate and regular rhythm.     Pulses: Normal pulses.     Heart sounds: Normal heart sounds.  Pulmonary:     Effort: Pulmonary effort is normal.     Breath sounds: Normal breath sounds.  Abdominal:     Comments: Abdomen is soft, prior scars are noted. Diffuse tenderness to left side of abdomen  Genitourinary:    Comments: Rectal exam performed with maroon stool noted no masses noted Musculoskeletal:        General: Normal range of motion.     Cervical back: Normal range of motion.  Skin:    General: Skin is warm and dry.     Capillary Refill: Capillary refill takes less than 2 seconds.  Neurological:     General: No focal deficit present.     Mental Status: She is alert.  Psychiatric:        Mood and Affect: Mood normal.     (all labs ordered are listed, but only abnormal results are displayed) Labs Reviewed  COMPREHENSIVE METABOLIC PANEL WITH GFR - Abnormal; Notable for the following components:      Result Value   Glucose, Bld 241 (*)    BUN 31 (*)    Creatinine, Ser 1.52 (*)    Calcium  10.4 (*)    GFR, Estimated 36 (*)    All other components within normal limits  CBC WITH DIFFERENTIAL/PLATELET - Abnormal; Notable for the following components:   WBC 12.1 (*)    RDW 17.2 (*)    Neutro Abs 9.3 (*)    Abs Immature Granulocytes 0.08 (*)    All other components within normal limits  PROTIME-INR  - Abnormal; Notable for the following components:   Prothrombin Time 24.3 (*)    INR 2.1 (*)    All other components within normal limits  I-STAT CHEM 8, ED - Abnormal; Notable for the following components:   BUN 32 (*)    Creatinine, Ser 1.60 (*)    Glucose, Bld 229 (*)    All other components within normal  limits  TYPE AND SCREEN  ABO/RH    EKG: EKG Interpretation Date/Time:  Wednesday September 15 2024 08:17:00 EST Ventricular Rate:  73 PR Interval:  180 QRS Duration:  100 QT Interval:  407 QTC Calculation: 449 R Axis:   -23  Text Interpretation: Sinus rhythm Borderline left axis deviation Low voltage, precordial leads Abnormal R-wave progression, late transition Borderline T abnormalities, diffuse leads Confirmed by Levander Houston [phone]) on 09/15/2024 8:24:14 AM  Radiology: CT ABDOMEN PELVIS W CONTRAST Result Date: 09/15/2024 CLINICAL DATA:  Abdominal pain with bright red blood per rectum beginning this morning. Patient on Xarelto . EXAM: CT ABDOMEN AND PELVIS WITH CONTRAST TECHNIQUE: Multidetector CT imaging of the abdomen and pelvis was performed using the standard protocol following bolus administration of intravenous contrast. RADIATION DOSE REDUCTION: This exam was performed according to the departmental dose-optimization program which includes automated exposure control, adjustment of the mA and/or kV according to patient size and/or use of iterative reconstruction technique. CONTRAST:  80mL OMNIPAQUE  IOHEXOL  300 MG/ML  SOLN COMPARISON:  CT abdomen/pelvis 07/20/2022 FINDINGS: Lower chest: Heart is normal size. Calcified plaque over the 3 vessel coronary arteries. Minimal calcified plaque over the descending thoracic aorta. Visualized lung bases are clear. Hepatobiliary: Previous cholecystectomy. Liver is unremarkable. Post cholecystectomy prominence of the common bile duct and central intrahepatic ducts without significant change. Pancreas: Normal. Spleen: Normal. Adrenals/Urinary  Tract: Adrenal glands are normal. Kidneys are normal in size. No evidence of nephrolithiasis. There are several subcentimeter bilateral renal cortical hypodensities too small to characterize, but likely cysts. Findings suggesting bilateral parapelvic renal cysts unchanged. Ureters and bladder are unremarkable. Stomach/Bowel: Postsurgical change over the stomach/left upper quadrant. Stomach is otherwise unchanged. Distal body of the stomach extends partially into a midline ventral hernia unchanged. Diverticula over the duodenal C sweep unchanged. Small bowel is otherwise unremarkable. Prior appendectomy. Mild diverticulosis throughout the colon without active inflammation. Mild fecal retention over the right colon with stool in fluid-filled rectosigmoid and left colon. There is a crescentic hyperdensity over the rectosigmoid junction in the midline posterior pelvis measuring 7 x 14 mm possibly focus of acute hemorrhage punctate hyperdensity within the rectum slightly more inferiorly. Vascular/Lymphatic: Moderate calcified plaque over the abdominal aorta which is normal in caliber. Reproductive: Status post hysterectomy. No adnexal masses. Other: No significant free fluid. No free peritoneal air. Midline supraumbilical ventral hernia as described earlier containing partial segment of distal stomach. This is unchanged. Wide fascial defect abdominal wall hernia just left of midline and just above the umbilicus unchanged containing short-segment small bowel loop. Musculoskeletal: L1 compression fracture unchanged. IMPRESSION: 1. Crescentic hyperdensity over the rectosigmoid junction in the midline posterior pelvis measuring 7 x 14 mm possibly focus of acute hemorrhage. Punctate hyperdensity within the rectum slightly more inferiorly. 2. Mild diverticulosis throughout the colon without active inflammation. Mild fecal retention over the right colon with stool in fluid-filled rectosigmoid and left colon. 3. Stable midline  supraumbilical ventral hernia containing partial segment of distal stomach. Stable wide fascial defect abdominal wall hernia just left of midline and just above the umbilicus containing short-segment small bowel loop unchanged. 4. Several subcentimeter bilateral renal cortical hypodensities too small to characterize, but likely cysts. Findings suggesting bilateral parapelvic renal cysts unchanged. 5. Aortic atherosclerosis. Atherosclerotic coronary artery disease. 6. Stable L1 compression fracture. Aortic Atherosclerosis (ICD10-I70.0). Critical Value/emergent results were called by telephone at the time of interpretation on 09/15/2024 at 10:42 am to provider [facility] Benen Weida , who verbally acknowledged these results. Electronically Signed   By: Toribio  Jearld M.D.   On: 09/15/2024 10:44   DG Chest 1 View Result Date: 09/15/2024 CLINICAL DATA:  Dizziness, weakness EXAM: CHEST  1 VIEW COMPARISON:  August 12, 2022. FINDINGS: The heart size and mediastinal contours are within normal limits. Hypoinflation of the lungs is noted. Both lungs are clear. The visualized skeletal structures are unremarkable. IMPRESSION: No active disease. Electronically Signed   By: Lynwood Landy Raddle M.D.   On: 09/15/2024 08:53     .Critical Care  Performed by: Levander Houston, MD Authorized by: Levander Houston, MD   Critical care provider statement:    Critical care time (minutes):  45   Critical care time was exclusive of:  Separately billable procedures and treating other patients and teaching time   Critical care was time spent personally by me on the following activities:  Development of treatment plan with patient or surrogate, discussions with consultants, evaluation of patient's response to treatment, examination of patient, ordering and review of laboratory studies, ordering and review of radiographic studies, ordering and performing treatments and interventions, pulse oximetry, re-evaluation of patient's condition and review of  old charts    Medications Ordered in the ED  pantoprazole  (PROTONIX ) injection 40 mg (40 mg Intravenous Given 09/15/24 0825)  iohexol  (OMNIPAQUE ) 300 MG/ML solution 80 mL (80 mLs Intravenous Contrast Given 09/15/24 0942)    Clinical Course as of 09/15/24 1110  Wed Sep 15, 2024  0915 Complete metabolic panel is reviewed interpreted significant for elevated glucose 241, BUN elevated 31, creatinine 1.52 which appears stable from prior CBC reviewed interpreted with normal hemoglobin at 13.3, white blood cell count 12,100 [DR]  0915 Chest x-Tiwana Chavis reviewed interpreted no evidence of acute abnormality noted radiologist interpretation concurs [DR]  1104 Discussed CT results with Dr. Kriss, on-call for GI.  We discussed via chat.  She advises to hold on CTA until she is able to evaluate patient. [DR]  1106 CT reviewed and interpreted.  Dr. Jearld called with concerns of area of midline posterior pelvis hemorrhage consistent with being in the rectum.  Mild diverticulosis noted, stable midline supraumbilical ventral hernia containing partial segment of distal stomach, please see full report for additional findings. [DR]    Clinical Course User Index [DR] Levander Houston, MD                                 Medical Decision Making Amount and/or Complexity of Data Reviewed Labs: ordered. Radiology: ordered.  Risk Prescription drug management.   74 year old female on Xarelto  presents today with rectal bleeding.  She has multiple chronic abdominal problems and has some tenderness in her abdomen which she describes as chronic. Here in the ED she is hemodynamically stable.  She is evaluated with physical exam which shows normotensive although there was some orthostasis noted per nursing on exam.  Heart rate is normal she is afebrile. Her abdomen has multiple old scars.  She is somewhat tender to the left side of her abdomen Her rectum was significant for maroon stool Patient was evaluated with labs and  has normal hemoglobin. Creatinine is increased to 1.5 slightly increased from baseline. Sugar is elevated at 241 Mild leukocytosis at 12,000 CT as described above with some probable extravasation in rectal area. Discussed via chat with GI, Dr. Kriss  Consult placed to hospitalist, Dr. Celinda Patient will need to have Xarelto  held, GI to see and evaluate for the rectal bleeding, Plan admission for ongoing evaluation  and treatment     Final diagnoses:  Rectal bleeding    ED Discharge Orders     None          Levander Houston, MD 09/15/24 1111  "

## 2024-09-15 NOTE — Progress Notes (Signed)
" ° ° °  PROCEDURAL EXPEDITER PROGRESS NOTE  Patient Name: Mary Ferguson  DOB:11-04-1950 Date of Admission: 09/15/2024  Date of Assessment:09/15/24   -------------------------------------------------------------------------------------------------------------------   Brief clinical summary: Pt to Endo tomorrow for Flexible sigmoidoscopy   Orders in place:  Yes   Labs, test, and orders reviewed: Y  Requires surgical clearance:  No  Barriers noted: N/A  -------------------------------------------------------------------------------------------------------------------  [facility], [place], [place] Please contact us  directly via secure chat (search for [facility]) or by calling us  at [phone] [facility]).  "

## 2024-09-16 ENCOUNTER — Encounter (HOSPITAL_COMMUNITY)
Admission: EM | Disposition: A | Discharge status: Home / Self Care | Payer: Self-pay | Source: Home / Self Care | Attending: Internal Medicine

## 2024-09-16 ENCOUNTER — Encounter (HOSPITAL_COMMUNITY): Payer: Self-pay | Admitting: *Deleted

## 2024-09-16 ENCOUNTER — Ambulatory Visit: Admitting: Cardiology

## 2024-09-16 ENCOUNTER — Inpatient Hospital Stay (HOSPITAL_COMMUNITY): Admitting: Anesthesiology

## 2024-09-16 ENCOUNTER — Inpatient Hospital Stay (HOSPITAL_COMMUNITY)

## 2024-09-16 DIAGNOSIS — Z86718 Personal history of other venous thrombosis and embolism: Secondary | ICD-10-CM

## 2024-09-16 DIAGNOSIS — I10 Essential (primary) hypertension: Secondary | ICD-10-CM | POA: Diagnosis not present

## 2024-09-16 DIAGNOSIS — I2510 Atherosclerotic heart disease of native coronary artery without angina pectoris: Secondary | ICD-10-CM | POA: Diagnosis not present

## 2024-09-16 DIAGNOSIS — K625 Hemorrhage of anus and rectum: Secondary | ICD-10-CM

## 2024-09-16 DIAGNOSIS — E785 Hyperlipidemia, unspecified: Secondary | ICD-10-CM

## 2024-09-16 DIAGNOSIS — I25119 Atherosclerotic heart disease of native coronary artery with unspecified angina pectoris: Secondary | ICD-10-CM | POA: Diagnosis not present

## 2024-09-16 DIAGNOSIS — Z6841 Body Mass Index (BMI) 40.0 and over, adult: Secondary | ICD-10-CM

## 2024-09-16 DIAGNOSIS — Z9861 Coronary angioplasty status: Secondary | ICD-10-CM

## 2024-09-16 DIAGNOSIS — Z87891 Personal history of nicotine dependence: Secondary | ICD-10-CM

## 2024-09-16 DIAGNOSIS — D62 Acute posthemorrhagic anemia: Secondary | ICD-10-CM | POA: Diagnosis not present

## 2024-09-16 DIAGNOSIS — J4542 Moderate persistent asthma with status asthmaticus: Secondary | ICD-10-CM

## 2024-09-16 DIAGNOSIS — E538 Deficiency of other specified B group vitamins: Secondary | ICD-10-CM | POA: Diagnosis not present

## 2024-09-16 DIAGNOSIS — I700 Atherosclerosis of aorta: Secondary | ICD-10-CM | POA: Diagnosis not present

## 2024-09-16 DIAGNOSIS — J42 Unspecified chronic bronchitis: Secondary | ICD-10-CM

## 2024-09-16 DIAGNOSIS — K922 Gastrointestinal hemorrhage, unspecified: Secondary | ICD-10-CM | POA: Diagnosis not present

## 2024-09-16 DIAGNOSIS — E118 Type 2 diabetes mellitus with unspecified complications: Secondary | ICD-10-CM

## 2024-09-16 DIAGNOSIS — G4733 Obstructive sleep apnea (adult) (pediatric): Secondary | ICD-10-CM

## 2024-09-16 LAB — CBC WITH DIFFERENTIAL/PLATELET
Abs Immature Granulocytes: 0.02 K/uL (ref 0.00–0.07)
Basophils Absolute: 0.1 K/uL (ref 0.0–0.1)
Basophils Relative: 1 %
Eosinophils Absolute: 0.3 K/uL (ref 0.0–0.5)
Eosinophils Relative: 4 %
HCT: 27.5 % — ABNORMAL LOW (ref 36.0–46.0)
Hemoglobin: 8.4 g/dL — ABNORMAL LOW (ref 12.0–15.0)
Immature Granulocytes: 0 %
Lymphocytes Relative: 35 %
Lymphs Abs: 2.6 K/uL (ref 0.7–4.0)
MCH: 26.7 pg (ref 26.0–34.0)
MCHC: 30.5 g/dL (ref 30.0–36.0)
MCV: 87.3 fL (ref 80.0–100.0)
Monocytes Absolute: 0.6 K/uL (ref 0.1–1.0)
Monocytes Relative: 8 %
Neutro Abs: 3.9 K/uL (ref 1.7–7.7)
Neutrophils Relative %: 52 %
Platelets: 207 K/uL (ref 150–400)
RBC: 3.15 MIL/uL — ABNORMAL LOW (ref 3.87–5.11)
RDW: 17.1 % — ABNORMAL HIGH (ref 11.5–15.5)
WBC: 7.5 K/uL (ref 4.0–10.5)
nRBC: 0 % (ref 0.0–0.2)

## 2024-09-16 LAB — GLUCOSE, CAPILLARY
Glucose-Capillary: 130 mg/dL — ABNORMAL HIGH (ref 70–99)
Glucose-Capillary: 106 mg/dL — ABNORMAL HIGH (ref 70–99)
Glucose-Capillary: 120 mg/dL — ABNORMAL HIGH (ref 70–99)
Glucose-Capillary: 91 mg/dL (ref 70–99)

## 2024-09-16 LAB — COMPREHENSIVE METABOLIC PANEL WITH GFR
ALT: 11 U/L (ref 0–44)
AST: 16 U/L (ref 15–41)
Albumin: 3.1 g/dL — ABNORMAL LOW (ref 3.5–5.0)
Alkaline Phosphatase: 75 U/L (ref 38–126)
Anion gap: 9 (ref 5–15)
BUN: 27 mg/dL — ABNORMAL HIGH (ref 8–23)
CO2: 20 mmol/L — ABNORMAL LOW (ref 22–32)
Calcium: 9.2 mg/dL (ref 8.9–10.3)
Chloride: 111 mmol/L (ref 98–111)
Creatinine, Ser: 1.09 mg/dL — ABNORMAL HIGH (ref 0.44–1.00)
GFR, Estimated: 54 mL/min — ABNORMAL LOW
Glucose, Bld: 139 mg/dL — ABNORMAL HIGH (ref 70–99)
Potassium: 4.3 mmol/L (ref 3.5–5.1)
Sodium: 140 mmol/L (ref 135–145)
Total Bilirubin: 0.7 mg/dL (ref 0.0–1.2)
Total Protein: 5.3 g/dL — ABNORMAL LOW (ref 6.5–8.1)

## 2024-09-16 LAB — CBC
HCT: 31.7 % — ABNORMAL LOW (ref 36.0–46.0)
Hemoglobin: 9.7 g/dL — ABNORMAL LOW (ref 12.0–15.0)
MCH: 26.6 pg (ref 26.0–34.0)
MCHC: 30.6 g/dL (ref 30.0–36.0)
MCV: 87.1 fL (ref 80.0–100.0)
Platelets: 200 K/uL (ref 150–400)
RBC: 3.64 MIL/uL — ABNORMAL LOW (ref 3.87–5.11)
RDW: 17.2 % — ABNORMAL HIGH (ref 11.5–15.5)
WBC: 8.2 K/uL (ref 4.0–10.5)
nRBC: 0 % (ref 0.0–0.2)

## 2024-09-16 LAB — HEMOGLOBIN AND HEMATOCRIT, BLOOD
HCT: 15.8 % — ABNORMAL LOW (ref 36.0–46.0)
Hemoglobin: 5 g/dL — CL (ref 12.0–15.0)

## 2024-09-16 LAB — PROTIME-INR
INR: 1.6 — ABNORMAL HIGH (ref 0.8–1.2)
Prothrombin Time: 19.6 s — ABNORMAL HIGH (ref 11.4–15.2)

## 2024-09-16 MED ORDER — NOREPINEPHRINE 4 MG/250ML-% IV SOLN: 0.0000 ug/min | INTRAVENOUS | Status: DC

## 2024-09-16 MED ORDER — SODIUM CHLORIDE 0.9% IV SOLUTION: Freq: Once | INTRAVENOUS | Status: AC

## 2024-09-16 MED ORDER — LORATADINE 10 MG PO TABS
10.0000 mg | ORAL_TABLET | Freq: Once | ORAL | Status: AC
  Administered 2024-09-16: 10 mg via ORAL
  Filled 2024-09-16: qty 1

## 2024-09-16 MED ORDER — LACTATED RINGERS IV SOLN: INTRAVENOUS | Status: DC | PRN

## 2024-09-16 MED ORDER — IOHEXOL 350 MG/ML SOLN
100.0000 mL | Freq: Once | INTRAVENOUS | Status: AC | PRN
  Administered 2024-09-16: 100 mL via INTRAVENOUS

## 2024-09-16 MED ORDER — PHENYLEPHRINE HCL-NACL 20-0.9 MG/250ML-% IV SOLN
INTRAVENOUS | Status: DC | PRN
  Administered 2024-09-16: 50 ug/min via INTRAVENOUS

## 2024-09-16 MED ORDER — ALBUMIN HUMAN 5 % IV SOLN
INTRAVENOUS | Status: AC
  Filled 2024-09-16: qty 500

## 2024-09-16 MED ORDER — SODIUM CHLORIDE 0.9 % IV SOLN: INTRAVENOUS | Status: DC | PRN

## 2024-09-16 MED ORDER — SODIUM CHLORIDE 0.9 % IV BOLUS
1000.0000 mL | Freq: Once | INTRAVENOUS | Status: AC
  Administered 2024-09-16: 1000 mL via INTRAVENOUS

## 2024-09-16 MED ORDER — PHENYLEPHRINE HCL (PRESSORS) 10 MG/ML IV SOLN
INTRAVENOUS | Status: AC
  Filled 2024-09-16: qty 1

## 2024-09-16 MED ORDER — PHENYLEPHRINE 80 MCG/ML (10ML) SYRINGE FOR IV PUSH (FOR BLOOD PRESSURE SUPPORT)
PREFILLED_SYRINGE | INTRAVENOUS | Status: DC | PRN
  Administered 2024-09-16 (×3): 160 ug via INTRAVENOUS
  Administered 2024-09-16: 240 ug via INTRAVENOUS

## 2024-09-16 MED ORDER — SODIUM CHLORIDE 0.9 % IV SOLN
250.0000 mL | INTRAVENOUS | Status: AC
  Administered 2024-09-16: 250 mL via INTRAVENOUS

## 2024-09-16 MED ORDER — ALBUMIN HUMAN 5 % IV SOLN: INTRAVENOUS | Status: DC | PRN

## 2024-09-16 MED ORDER — LIDOCAINE HCL (CARDIAC) PF 100 MG/5ML IV SOSY
PREFILLED_SYRINGE | INTRAVENOUS | Status: DC | PRN
  Administered 2024-09-16: 100 mg via INTRAVENOUS

## 2024-09-16 MED ORDER — CAMPHOR-MENTHOL 0.5-0.5 % EX LOTN
TOPICAL_LOTION | CUTANEOUS | Status: DC | PRN
  Administered 2024-09-18: 1 via TOPICAL
  Filled 2024-09-16: qty 222

## 2024-09-16 MED ORDER — PROPOFOL 500 MG/50ML IV EMUL
INTRAVENOUS | Status: DC | PRN
  Administered 2024-09-16: 125 ug/kg/min via INTRAVENOUS

## 2024-09-16 MED ORDER — PEG 3350-KCL-NA BICARB-NACL 420 G PO SOLR
4000.0000 mL | Freq: Once | ORAL | Status: AC
  Administered 2024-09-16: 4000 mL via ORAL
  Filled 2024-09-16: qty 4000

## 2024-09-16 MED ORDER — PROPOFOL 10 MG/ML IV BOLUS
INTRAVENOUS | Status: DC | PRN
  Administered 2024-09-16: 20 mg via INTRAVENOUS

## 2024-09-16 NOTE — Anesthesia Postprocedure Evaluation (Signed)
"   Anesthesia Post Note  Patient: Mary Ferguson  Procedure(s) Performed: KINGSTON SIDE     Patient location during evaluation: PACU Anesthesia Type: MAC Level of consciousness: awake and alert Pain management: pain level controlled Vital Signs Assessment: post-procedure vital signs reviewed and stable Respiratory status: spontaneous breathing Cardiovascular status: stable Anesthetic complications: no   No notable events documented.  Last Vitals:  Vitals:   09/16/24 1352 09/16/24 1400  BP: (!) 112/47 (!) 116/50  Pulse: 68 62  Resp: 15 16  Temp:    SpO2: 100% 99%    Last Pain:  Vitals:   09/16/24 1400  TempSrc:   PainSc: 0-No pain                 Norleen Pope      "

## 2024-09-16 NOTE — Progress Notes (Signed)
 large bright red BM with clots, post flex sig. Notifed MD pt was suddenly nauseous, dizzy and pale. BP 87/34 map 51 hr 80 o2 98% see new orders.

## 2024-09-16 NOTE — Anesthesia Preprocedure Evaluation (Signed)
"                                    Anesthesia Evaluation  Patient identified by MRN, date of birth, ID band Patient awake    Reviewed: Allergy & Precautions, NPO status , Patient's Chart, lab work & pertinent test results  Airway Mallampati: III  TM Distance: >3 FB Neck ROM: Full    Dental  (+) Dental Advisory Given, Teeth Intact   Pulmonary asthma , sleep apnea , pneumonia, COPD, former smoker   Pulmonary exam normal breath sounds clear to auscultation       Cardiovascular hypertension, + angina  + CAD and + Past MI  Normal cardiovascular exam Rhythm:Regular Rate:Normal  Echo 2020 1. Left ventricular ejection fraction, by visual estimation, is 60 to 65%.  The left ventricle has normal function. Normal left ventricular size.  There is severely increased left ventricular hypertrophy. The left  ventricular hypertrophy involves basal-septum walls.   2. Left ventricular diastolic Doppler parameters are consistent with  impaired relaxation pattern of LV diastolic filling.   3. Global right ventricle has normal systolic function.The right ventricular  size is normal. No increase in right ventricular wall thickness.   4. Left atrial size was normal.   5. Right atrial size was normal.   6. Mild to moderate mitral annular calcification. No evidence of mitral  valve regurgitation. No evidence of mitral stenosis.   7. The tricuspid valve is normal in structure. Tricuspid valve  regurgitation was not visualized by color flow Doppler.   8. The aortic valve is tricuspid Aortic valve regurgitation was not visualized  by color flow Doppler. Mild aortic valve sclerosis without stenosis.   9. The pulmonic valve was normal in structure. Pulmonic valve  regurgitation is not visualized by color flow Doppler.  10. The inferior vena cava is normal in size with greater than 50%  respiratory variability, suggesting right atrial pressure of 3 mmHg.      Neuro/Psych  Headaches  PSYCHIATRIC DISORDERS Anxiety Depression       GI/Hepatic Neg liver ROS,GERD  ,,  Endo/Other  diabetes  Class 3 obesity  Renal/GU Renal disease     Musculoskeletal negative musculoskeletal ROS (+)    Abdominal  (+) + obese  Peds  Hematology  (+) Blood dyscrasia, anemia   Anesthesia Other Findings   Reproductive/Obstetrics                              Anesthesia Physical Anesthesia Plan  ASA: 3  Anesthesia Plan: MAC   Post-op Pain Management: Minimal or no pain anticipated   Induction:   PONV Risk Score and Plan: 2 and Propofol  infusion, TIVA and Treatment may vary due to age or medical condition  Airway Management Planned:   Additional Equipment:   Intra-op Plan:   Post-operative Plan:   Informed Consent: I have reviewed the patients History and Physical, chart, labs and discussed the procedure including the risks, benefits and alternatives for the proposed anesthesia with the patient or authorized representative who has indicated his/her understanding and acceptance.     Dental advisory given  Plan Discussed with: CRNA  Anesthesia Plan Comments:          Anesthesia Quick Evaluation  "

## 2024-09-16 NOTE — Anesthesia Procedure Notes (Signed)
 Date/Time: 09/16/2024 12:46 PM  Performed by: Therisa Doyal CROME, CRNAOxygen Delivery Method: Simple face mask

## 2024-09-16 NOTE — Interval H&P Note (Signed)
 History and Physical Interval Note:  09/16/2024 12:24 PM  Mary Ferguson  has presented today for surgery, with the diagnosis of Hematochezia, diverticulosis.  The various methods of treatment have been discussed with the patient and family. After consideration of risks, benefits and other options for treatment, the patient has consented to  Procedures: SIGMOIDOSCOPY, FLEXIBLE (N/A) as a surgical intervention.  The patient's history has been reviewed, patient examined, no change in status, stable for surgery.  I have reviewed the patient's chart and labs.  Questions were answered to the patient's satisfaction.     [name]

## 2024-09-16 NOTE — Plan of Care (Signed)
   Problem: Education: Goal: Knowledge of General Education information will improve Description: Including pain rating scale, medication(s)/side effects and non-pharmacologic comfort measures Outcome: Progressing   Problem: Activity: Goal: Risk for activity intolerance will decrease Outcome: Progressing   Problem: Nutrition: Goal: Adequate nutrition will be maintained Outcome: Progressing

## 2024-09-16 NOTE — Transfer of Care (Signed)
 Immediate Anesthesia Transfer of Care Note  Patient: Mary Ferguson Last  Procedure(s) Performed: KINGSTON SIDE  Patient Location: Endoscopy Unit  Anesthesia Type:MAC  Level of Consciousness: awake, alert , and oriented  Airway & Oxygen Therapy: Patient Spontanous Breathing and Patient connected to face mask oxygen  Post-op Assessment: Report given to RN and Post -op Vital signs reviewed and stable  Post vital signs: Reviewed and stable  Last Vitals:  Vitals Value Taken Time  BP 130/53 09/16/24 13:40  Temp    Pulse    Resp 17 09/16/24 13:40  SpO2    Vitals shown include unfiled device data.  Last Pain:  Vitals:   09/16/24 1223  TempSrc: Temporal  PainSc: 0-No pain         Complications: No notable events documented.

## 2024-09-16 NOTE — Consult Note (Signed)
 "  NAME:  Mary Ferguson, MRN:  [id number], DOB:  01-07-1951, LOS: 1 ADMISSION DATE:  09/15/2024, CONSULTATION DATE:  09/16/2024 REFERRING MD:  Dr. Sherrill, CHIEF COMPLAINT: LGIB   History of Present Illness:  Ms. 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 Initial hemoglobin was 13.3 and CT AP showed hyperdensity within the rectosigmoid junction concerning for acute hemorrhage, punctate hyperdensity within the rectum more inferiorly, and mild diverticulosis throughout he colon without active inflammation. By this morning hemoglobin had dropped to 9.7.  GI was consulted and she went for flex sig which showed blood in the rectum, sigmoid and descending colon as well as diverticulosis in the recto-sigmoid colon, sigmoid and descending colon, preparation was poor but no active signs of bleeding.   Patient then developed an episode of BRBPR with associated hypotension. Hemoglobin had dropped to 5.0 from 8.4. CT GIB protocol negative for active bleeding. Given ongoing hypotension PCCM was consulted for assistance with management.   Pertinent  Medical History  Per above  Significant Hospital Events: Including procedures, antibiotic start and stop dates in addition to other pertinent events   09/15/2024 admitted for LGIB and ABLA  Interim History / Subjective:  Patient pale and blood pressure adequate with blood transfusing. Complaints of ongoing cramping abdominal pain. Endorses large volume bright red blood per rectum this afternoon. She is dizzy but it's improving.   Objective    Blood pressure (!) 76/46, pulse 80, temperature 97.9 F (36.6 C), temperature source Oral, resp.  rate 19, height 5' 2 (1.575 m), weight 116 kg, SpO2 91%.        Intake/Output Summary (Last 24 hours) at 09/16/2024 1831 Last data filed at 09/16/2024 1601 Gross per 24 hour  Intake 1494.83 ml  Output --  Net 1494.83 ml   Filed Weights   09/15/24 0806 09/16/24 1223  Weight: 116 kg 116 kg    Examination: General: acute on chronically ill appearing female, pale HENT: [place]/AT, sclera anicteric Lungs: breathing comfortably on room air, diminished bilateral bases Cardiovascular: RRR, normal S1 and S2, no m/r/g Abdomen: soft, mildly tender throughout, mildly distended  Extremities: warm, dry, trace edema Neuro: alert and responsive, answers questions appropriately  GU: deferred  Resolved problem list   Assessment and Plan   LGIB; likely in the setting of chronic anticoagulation and diverticulosis, CTA GIB protocol this afternoon with no evidence of active bleeding  Acute blood loss anemia Hemorrhagic shock; improving with blood transfusion  - GI following, appreciate recommendations - NPO for now - BP improved with blood transfusion, hold off on vasopressors for now but can start if MAP<65 - Agree with 3 PRBC, will also give 1 FFP and check coags. Correction of coagulopathy as clinically indicated  - Continue to trend H/H - Consider repeat CTA GIB if ongoing active bleeding and if active extravasation IR for embolization - Continue to hold home Xarelto   - Agree with holding home antihypertensives with acute hypotension   History of asthma/COPD without evidence of acute exacerbation; currently on room air  History of OSA - Continue PRN albuterol  - CPAP at bedtime   Remainder of care per primary. If patient decompensates and requires IV pressors can escalate to ICU level of care. Will continue to  follow.  Labs   CBC: Recent Labs  Lab 09/15/24 0816 09/15/24 0818 09/15/24 1531 09/15/24 2103 09/16/24 0330 09/16/24 1553 09/16/24 1642  WBC 12.1*  --   --   --  8.2 7.5  --    NEUTROABS 9.3*  --   --   --   --  3.9  --   HGB 13.3   < > 11.6* 10.8* 9.7* 8.4* 5.0*  HCT 42.7   < > 36.3 34.8* 31.7* 27.5* 15.8*  MCV 85.9  --   --   --  87.1 87.3  --   PLT 291  --   --   --  200 207  --    < > = values in this interval not displayed.    Basic Metabolic Panel: Recent Labs  Lab 09/15/24 0816 09/15/24 0818 09/16/24 0330  NA 141 142 140  K 4.8 4.7 4.3  CL 108 107 111  CO2 24  --  20*  GLUCOSE 241* 229* 139*  BUN 31* 32* 27*  CREATININE 1.52* 1.60* 1.09*  CALCIUM  10.4*  --  9.2   GFR: Estimated Creatinine Clearance: 56.3 mL/min (A) (by C-G formula based on SCr of 1.09 mg/dL (H)). Recent Labs  Lab 09/15/24 0816 09/16/24 0330 09/16/24 1553  WBC 12.1* 8.2 7.5    Liver Function Tests: Recent Labs  Lab 09/15/24 0816 09/16/24 0330  AST 19 16  ALT 14 11  ALKPHOS 101 75  BILITOT 0.6 0.7  PROT 6.7 5.3*  ALBUMIN  3.8 3.1*   No results for input(s): LIPASE, AMYLASE in the last 168 hours. No results for input(s): AMMONIA in the last 168 hours.  ABG    Component Value Date/Time   HCO3 25.7 08/12/2022 0836   TCO2 24 09/15/2024 0818   ACIDBASEDEF 2.0 08/12/2022 0836   O2SAT 51 08/12/2022 0836     Coagulation Profile: Recent Labs  Lab 09/15/24 0816 09/16/24 0330  INR 2.1* 1.6*    Cardiac Enzymes: No results for input(s): CKTOTAL, CKMB, CKMBINDEX, TROPONINI in the last 168 hours.  HbA1C: Hemoglobin A1C  Date/Time Value Ref Range Status  07/06/2024 01:16 PM 7.2 (A) 4.0 - 5.6 % Final  07/17/2020 12:00 AM 6.5  Final  12/16/2019 12:00 AM 6.6  Final   HbA1c, POC (prediabetic range)  Date/Time Value Ref Range Status  07/06/2024 01:16 PM 7.2 (A) 5.7 - 6.4 % Final  10/19/2021 03:29 PM 7.2 (A) 5.7 - 6.4 % Final   HbA1c, POC (controlled diabetic range)  Date/Time Value Ref Range Status  07/06/2024 01:16 PM 7.2 (A) 0.0 - 7.0 % Final  10/19/2021 03:29 PM 7.2 (A) 0.0 - 7.0 % Final   HbA1c POC (<> result, manual entry)  Date/Time  Value Ref Range Status  07/06/2024 01:16 PM 7.2 4.0 - 5.6 % Final  10/19/2021 03:29 PM 7.2 4.0 - 5.6 % Final   Hgb A1c MFr Bld  Date/Time Value Ref Range Status  01/28/2024 01:49 PM 7.0 (H) 4.6 - 6.5 % Final    Comment:    Glycemic Control Guidelines for People with Diabetes:Non Diabetic:  <6%Goal of Therapy: <7%Additional Action Suggested:  >8%   10/28/2023 01:56 PM 7.1 (H) 4.6 - 6.5 % Final    Comment:    Glycemic Control Guidelines for People with Diabetes:Non Diabetic:  <6%Goal of Therapy: <7%Additional Action Suggested:  >8%     CBG: Recent Labs  Lab 09/15/24 1802 09/15/24 2118 09/16/24 0734 09/16/24 1138 09/16/24 1615  GLUCAP 118* 128* 130* 106*  120*    Review of Systems:   Per above  Past Medical History:  She,  has a past medical history of Abdominal wall hernia (01/27/2014), Acute and chronic respiratory failure with hypoxia (HCC) (01/24/2018), Acute diverticulitis (07/20/2022), Acute kidney injury superimposed on chronic kidney disease (07/21/2022), Asthma, Blood in stool, Chicken pox, Chronic generalized abdominal pain, Chronic pain, CKD (chronic kidney disease), stage III (HCC), Colon polyps, Compression fracture of L1 lumbar vertebra (HCC), COPD (chronic obstructive pulmonary disease) (HCC), COVID-19 (2020), Depression, Diverticulitis, Fall at home, initial encounter (07/21/2022), Fecal incontinence, Frequent headaches, GERD (gastroesophageal reflux disease), Heart attack (HCC), Heart disease, High anion gap metabolic acidosis (07/21/2022), History of blood transfusion, Hyperlipemia, Hypertension, Incontinence of feces (12/11/2016), Pneumonia due to infectious organism (01/12/2018), Postmenopausal (03/21/2021), PTSD (post-traumatic stress disorder), Recurrent deep vein thrombosis (DVT) (HCC), Rhabdomyolysis (07/21/2022), and Urinary incontinence.   Surgical History:   Past Surgical History:  Procedure Laterality Date   ABDOMINAL HYSTERECTOMY  1995   APPENDECTOMY      CATARACT EXTRACTION Bilateral    CESAREAN SECTION  1984   CHOLECYSTECTOMY  2016   COLOSTOMY     and reversal   CORONARY STENT INTERVENTION N/A 06/18/2019   Procedure: CORONARY STENT INTERVENTION;  Surgeon: Darron Deatrice LABOR, MD;  Location: [facility];  Service: Cardiovascular;  Laterality: N/A;   ENDOMETRIAL ABLATION     HERNIA REPAIR  1985   1985 and 2016 (2016 ventral w/ incarceration) 33 surgeries   HYSTEROSCOPY WITH D & C     13   LAPAROSCOPIC LYSIS OF ADHESIONS     x2   LEFT HEART CATH AND CORONARY ANGIOGRAPHY N/A 06/18/2019   Procedure: LEFT HEART CATH AND CORONARY ANGIOGRAPHY;  Surgeon: Darron Deatrice LABOR, MD;  Location: [facility];  Service: Cardiovascular;  Laterality: N/A;   LEFT HEART CATH AND CORONARY ANGIOGRAPHY N/A 11/30/2019   Procedure: LEFT HEART CATH AND CORONARY ANGIOGRAPHY;  Surgeon: Burnard Debby LABOR, MD;  Location: [facility];  Service: Cardiovascular;  Laterality: N/A;   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 She has never used smokeless tobacco. She reports that she does not currently use alcohol. She reports that she does not use drugs.   Family History:  Her family history includes Alcohol abuse in her half-brother, half-sister, mother, sister, and son; Arthritis in her half-brother, maternal grandmother, and paternal grandmother; Asthma in her half-brother, sister, and son; Breast cancer in her paternal grandmother; COPD in her maternal grandfather, maternal grandmother, and paternal grandfather; Clotting disorder in her son; Depression in her father, half-brother, half-sister, mother, paternal grandmother, sister, and son; Diabetes in her half-brother, half-sister, and maternal grandmother; Drug abuse in her half-brother, half-sister, mother, sister, and son; Early death in her maternal grandfather, paternal grandfather,  and sister; Heart attack in her maternal grandfather, paternal grandfather, and paternal grandmother; Heart disease in her father, half-brother, maternal grandfather, maternal grandmother, paternal grandfather, and paternal grandmother; Hyperlipidemia in her half-brother; Hypertension in her half-brother and son; Kidney disease in her half-brother; Learning disabilities in her half-brother, half-sister, and mother; Mental illness in her father, half-brother, half-sister, mother, and son; Miscarriages / Stillbirths in her mother; Parkinson's disease in her maternal grandmother; Testicular cancer in her half-brother; Thyroid  cancer in her half-sister.   Allergies Allergies[1]   Home Medications  Prior to Admission medications  Medication Sig Start Date End Date Taking? Authorizing Provider  acetaminophen  (TYLENOL ) 500 MG tablet Take 500-1,000 mg by mouth daily.   Yes [provider]  albuterol  (VENTOLIN  HFA) 108 (90 Base) MCG/ACT inhaler Inhale 2 puffs into the lungs every 4 (four) hours as needed for wheezing or shortness of breath. 07/06/24  Yes Kuneff, Renee A, DO  amitriptyline  (ELAVIL ) 75 MG tablet Take 1 tablet (75 mg total) by mouth at bedtime. 07/06/24  Yes Kuneff, Renee A, DO  atorvastatin  (LIPITOR ) 80 MG tablet Take 1 tablet (80 mg total) by mouth every evening. 10/07/23  Yes Croitoru, Mihai, MD  carvedilol  (COREG ) 6.25 MG tablet TAKE 1 TABLET BY MOUTH EVERY MORNING AND 2 TABLETS EVERY EVENING. 09/01/24  Yes Croitoru, Mihai, MD  Cholecalciferol (VITAMIN D3) 50 MCG (2000 UT) capsule Take 2 capsules (4,000 Units total) by mouth daily. 07/06/24  Yes Kuneff, Renee A, DO  clotrimazole  (LOTRIMIN ) 1 % cream Apply 1 Application topically 2 (two) times daily. Patient taking differently: Apply 1 Application topically 2 (two) times daily as needed (irritation). 07/06/24  Yes Kuneff, Renee A, DO  Coenzyme Q10 (COQ-10 PO) Take 1 tablet by mouth daily.   Yes [provider]  Cyanocobalamin   (B-12) 5000 MCG SUBL Place 5,000 mcg under the tongue daily.   Yes [provider]  dicyclomine  (BENTYL ) 20 MG tablet Take 1 tablet (20 mg total) by mouth 4 (four) times daily as needed for spasms. Patient taking differently: Take 20 mg by mouth See admin instructions. Take 20mg  (1 tablet) by mouth with meals. 07/06/24  Yes Kuneff, Renee A, DO  empagliflozin  (JARDIANCE ) 10 MG TABS tablet Take 1 tablet (10 mg total) by mouth daily before breakfast. 07/06/24  Yes Kuneff, Renee A, DO  famotidine  (PEPCID ) 40 MG tablet Take 1 tablet (40 mg total) by mouth at bedtime. 07/06/24  Yes Kuneff, Renee A, DO  furosemide  (LASIX ) 20 MG tablet Take 1 tablet (20 mg total) by mouth daily as needed. 07/06/24  Yes Kuneff, Renee A, DO  hydrOXYzine  (ATARAX ) 25 MG tablet Take 1 tablet (25 mg total) by mouth every 8 (eight) hours as needed for itching. 07/06/24  Yes Kuneff, Renee A, DO  isosorbide  mononitrate (IMDUR ) 60 MG 24 hr tablet TAKE 1 TABLET(60 MG) BY MOUTH DAILY 10/07/23  Yes Croitoru, Mihai, MD  MAGnesium -Oxide 400 (240 Mg) MG tablet Take 1 tablet (400 mg total) by mouth 2 (two) times daily. 07/06/24  Yes Kuneff, Renee A, DO  Multiple Vitamin (MULTIVITAMIN PO) Take 1 tablet by mouth daily.   Yes [provider]  nitroGLYCERIN  (NITROSTAT ) 0.4 MG SL tablet Place 1 tablet (0.4 mg total) under the tongue every 5 (five) minutes as needed for chest pain. 07/10/23  Yes Croitoru, Mihai, MD  psyllium (HYDROCIL/METAMUCIL) 95 % PACK Take 1 packet by mouth daily. Patient taking differently: Take 1 packet by mouth daily as needed for moderate constipation. 07/26/22  Yes Odell Celinda Balo, MD  rivaroxaban  (XARELTO ) 20 MG TABS tablet Take 1 tablet (20 mg total) by mouth daily with supper. 09/02/24  Yes Croitoru, Mihai, MD  ondansetron  (ZOFRAN ) 4 MG tablet Take 1 tablet (4 mg total) by mouth every 8 (eight) hours as needed for nausea or vomiting. Patient not taking: Reported on 09/02/2024 07/06/24   Catherine Fuller A,  DO     Critical care time: 30 minutes    The patient is critically ill with multiple organ system failure and requires high complexity decision making for assessment and support, frequent  evaluation and titration of therapies, advanced monitoring, review of radiographic studies and interpretation of complex data.   Critical Care Time devoted to patient care services, exclusive of separately billable procedures, described in this note is 30 minutes.  Rexene LOISE Blush, PA-C [facility] 09/16/24 6:56 PM  Please see [email or web address] for pager details.  From 7A-7P if no response, please call [phone] After hours, please call Ema [phone]      [1]  Allergies Allergen Reactions   Rofecoxib Anaphylaxis    VIOXX   Metformin  And Related Other (See Comments)    Ibs    Sulfonylureas Other (See Comments)    Dizziness   Benadryl Allergy [Diphenhydramine Hcl] Other (See Comments)    Depressed respirations   Cephalexin Rash   Gatifloxacin Other (See Comments)    Confusion Very low blood pressure [place]   "

## 2024-09-16 NOTE — Progress Notes (Addendum)
 " PROGRESS NOTE    Mary Ferguson  FMW:[id number] DOB: 1951/08/24 DOA: 09/15/2024 PCP: Catherine Charlies LABOR, DO   Brief Narrative:  The Patient Mary Ferguson is a 74 y.o. 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   GI was consulted and took her for a Flex Sig and she had old blood and diverticulosis but no active signs of bleeding. After her Flex Sig she had a large BRBPR and became hypotensive. She's getting another 1 Liter NS bolus, STAT CBC, and STAT CTA GI Bleed  Assessment and Plan:  Acute lower GI bleeding with associated Acute blood loss anemia (ABLA)/Symptomatic Anemia: Admitted to SDU and was made NPO. S/p IVF Hydration and 1 Liter Bolus.  -Was NPO for Flex Sig and now on CLD -CT Abd/Pelvis done and showed Crescentic hyperdensity over the rectosigmoid junction in the midline posterior pelvis measuring 7 x 14 mm possibly a focus of acute hemorrhage. Punctate hyperdensity within the rectum slightly more inferiorly. Mild diverticulosis throughout the colon without active inflammation. Mild fecal retention over the right colon with stool in fluid-filled rectosigmoid and the left colon. Unchanged renal cyst. Aortic atherosclerosis. Atherosclerotic coronary artery disease. Stable L1 fracture. -Underwent Flex Sig which showed that preparation the colon was poor and that there was blood in the rectum and sigmoid colon and the descending colon as well as the mid descending colon and distal descending  colon but no active bleeding.  Diverticulosis was noted in the rectosigmoid colon as well as in the sigmoid colon and the descending colon. -After the Flex Sig she had a large BRBPR Bowel Movement and was symptomatic and hypotenstive. -Hgb/Hct Trend:  Recent Labs  Lab 09/15/24 0816 09/15/24 0818 09/15/24 1218 09/15/24 1531 09/15/24 2103 09/16/24 0330  HGB 13.3 14.3 11.2* 11.6* 10.8* 9.7*  HCT 42.7 42.0 36.9 36.3 34.8* 31.7*  MCV 85.9  --   --   --   --  87.1  -Repeating 1 Liter Bolus, obtaining STAT CBC, and STAT CTA GI Bleed -GI following and recommended CTA GI Bleed; Will Monitor H/H q6h now -CTM in SDU given her Hypotension -PT-INR was 19.6-1.6 -C/w Supportive Care and C/w Antiemetics w/ po/IV q6hprn Nausea   Essential Hypertension -> Hypotension: Hold antihypertensives for now given her recent BRBPR and associated drop in Blood Pressure. CTM BP per Protocol. Last BP reading was 87/34 with MAP of 51   CAD S/P PCI: Continue Atorvastatin  80 mg po q Evening. Continue to Hold beta-blocker, long-acting nitrates and DOAC.   Aortic Atherosclerosis / Dyslipidemia, goal LDL below 70: Continue Atorvastatin  80 mg p.o. daily.   Obstructive Sleep Apnea (adult) (pediatric): May use CPAP at bedtime.   Moderate persistent asthma / COPD (chronic obstructive pulmonary disease) w/ Chronic Respiratory Failure with Chronic 3 Liters of Supplemental O2 via [place] (HCC): -Change Albuterol  MDI to Albuterol  Neb 2.5 mg IH q4hprn Wheezing and SOB -CTM Respiratory Status    B12 Deficiency: Continue B12 supplementation with po Cyanocobalamin  5,000 mcg po Daily    History of recurrent deep vein thrombosis (DVT): Anticoagulation w/ Rivaroxaban  on hold due to GIB   Type II Diabetes Mellitus with Manifestations (  HCC): On CLD but once stable will need to be on a [place] [employer]/ Carbohydrate modified diet. CBG monitoring with Resistant Novolog  SSI AC. Check hemoglobin A1c. CTM CBG's per Protocol. CBG Trend:  Recent  Labs  Lab 09/15/24 1802 09/15/24 2118 09/16/24 0734 09/16/24 1138  GLUCAP 118* 128* 130* 106*   PTSD: C/w Amitriptyline  75 mg po qHS  AKI on CKD Stage 3a / Metabolic Acidosis: BUN/Cr Trend: Recent Labs  Lab 09/15/24 0816 09/15/24 0818 09/16/24 0330  BUN 31* 32* 27*  CREATININE 1.52* 1.60* 1.09*  -Is a slight metabolic acidosis with a CO2 of 20, Anion Gap of 9, Chloride Level 111 -Avoid Nephrotoxic Medications, Contrast Dyes, Hypotension and Dehydration to Ensure Adequate Renal Perfusion and will need to Renally Adjust Meds. CTM & Trend Renal Fxn carefully & repeat CMP in the AM  Hypoalbuminemia: Patient's Albumin  Lvl went from 3.8 -> 3.1. CTM & Trend & repeat CMP in the AM  Class III (Morbid) Obesity: Complicates overall prognosis and care. Estimated body mass index is 46.77 kg/m as calculated from the following:   Height as of this encounter: 5' 2 (1.575 m).   Weight as of this encounter: 116 kg. Weight Loss and Dietary Counseling given   DVT prophylaxis: SCDs Start: 09/15/24 1131    Code Status: Full Code Family Communication: No family present at bedside but was updated by the GI team  Disposition Plan:  Level of care: Stepdown Status is: Inpatient Remains inpatient appropriate because: Having Symptomatic GI Bleeding and will need further workup and clinical stability    Consultants:  Gastroenterology  Interventional Radiology   Procedures:  Flexible Sigmoidoscopy   Antimicrobials:  Anti-infectives (From admission, onward)    None       Subjective: Seen and examined at bedside this morning was feeling okay.  She was awaiting flexible sigmoidoscopy.  States that she had some nausea earlier and states that she was having some GI bleeding but never to this extent.  No chest pain or shortness of breath.  Felt okay but after her flex sig she became symptomatic became hypotensive and lightheaded and nauseous.  She is now going for a stat CTA and as well as a stat  CBC and getting 1 L bolus  Objective: Vitals:   09/16/24 1545 09/16/24 1550 09/16/24 1555 09/16/24 1600  BP: (!) 123/40 (!) 104/34 (!) 108/43   Pulse: 73 75 71 70  Resp: 18 19 13 18   Temp:      TempSrc:      SpO2: 97% 96% 99% 100%  Weight:      Height:        Intake/Output Summary (Last 24 hours) at 09/16/2024 1602 Last data filed at 09/16/2024 1601 Gross per 24 hour  Intake 2575.97 ml  Output --  Net 2575.97 ml   Filed Weights   09/15/24 0806 09/16/24 1223  Weight: 116 kg 116 kg   Examination: Physical Exam:  Constitutional: WN/WD morbidly obese chronically ill-appearing Caucasian female appears calm Respiratory: Diminished to auscultation bilaterally, no wheezing, rales, rhonchi or crackles. Normal respiratory effort and patient is not tachypenic. No accessory muscle use.  Unlabored breathing Cardiovascular: RRR, no murmurs / rubs / gallops. S1 and S2 auscultated.  Mild extremity edema Abdomen: Soft, mildly-tender, distended secondary body habitus. Bowel sounds positive.  GU: Deferred. Musculoskeletal: No clubbing / cyanosis of digits/nails. No joint deformity upper and lower extremities.  Skin: No rashes, lesions, ulcers on a limited skin evaluation. No induration; Warm and dry.  Neurologic:  CN 2-12 grossly intact with no focal deficits. Romberg sign and cerebellar reflexes not assessed.  Psychiatric: Normal judgment and insight. Alert and oriented x 3. Normal mood and appropriate affect.   Data Reviewed: I have personally reviewed following labs and imaging studies  CBC: Recent Labs  Lab 09/15/24 0816 09/15/24 0818 09/15/24 1218 09/15/24 1531 09/15/24 2103 09/16/24 0330  WBC 12.1*  --   --   --   --  8.2  NEUTROABS 9.3*  --   --   --   --   --   HGB 13.3 14.3 11.2* 11.6* 10.8* 9.7*  HCT 42.7 42.0 36.9 36.3 34.8* 31.7*  MCV 85.9  --   --   --   --  87.1  PLT 291  --   --   --   --  200   Basic Metabolic Panel: Recent Labs  Lab 09/15/24 0816 09/15/24 0818  09/16/24 0330  NA 141 142 140  K 4.8 4.7 4.3  CL 108 107 111  CO2 24  --  20*  GLUCOSE 241* 229* 139*  BUN 31* 32* 27*  CREATININE 1.52* 1.60* 1.09*  CALCIUM  10.4*  --  9.2   GFR: Estimated Creatinine Clearance: 56.3 mL/min (A) (by C-G formula based on SCr of 1.09 mg/dL (H)). Liver Function Tests: Recent Labs  Lab 09/15/24 0816 09/16/24 0330  AST 19 16  ALT 14 11  ALKPHOS 101 75  BILITOT 0.6 0.7  PROT 6.7 5.3*  ALBUMIN  3.8 3.1*   No results for input(s): LIPASE, AMYLASE in the last 168 hours. No results for input(s): AMMONIA in the last 168 hours. Coagulation Profile: Recent Labs  Lab 09/15/24 0816 09/16/24 0330  INR 2.1* 1.6*   Cardiac Enzymes: No results for input(s): CKTOTAL, CKMB, CKMBINDEX, TROPONINI in the last 168 hours. BNP (last 3 results) No results for input(s): PROBNP in the last 8760 hours. HbA1C: No results for input(s): HGBA1C in the last 72 hours. CBG: Recent Labs  Lab 09/15/24 1802 09/15/24 2118 09/16/24 0734 09/16/24 1138  GLUCAP 118* 128* 130* 106*   Lipid Profile: No results for input(s): CHOL, HDL, LDLCALC, TRIG, CHOLHDL, LDLDIRECT in the last 72 hours. Thyroid  Function Tests: No results for input(s): TSH, T4TOTAL, FREET4, T3FREE, THYROIDAB in the last 72 hours. Anemia Panel: No results for input(s): VITAMINB12, FOLATE, FERRITIN, TIBC, IRON, RETICCTPCT in the last 72 hours. Sepsis Labs: No results for input(s): PROCALCITON, LATICACIDVEN in the last 168 hours.  Recent Results (from the past 240 hours)  Surgical PCR screen     Status: Abnormal   Collection Time: 09/15/24  5:55 PM   Specimen: Nasal Mucosa; Nasal Swab  Result Value Ref Range Status   MRSA, PCR NEGATIVE NEGATIVE Final   Staphylococcus aureus POSITIVE (A) NEGATIVE Final    Comment: (NOTE) The Xpert SA Assay (FDA approved for NASAL specimens in patients 75 years of age and older), is one component of a  comprehensive surveillance program. It is not intended to diagnose infection nor to guide or monitor treatment. Performed at [facility], [address], [place], [place] [address]     Radiology Studies: CT ABDOMEN PELVIS W CONTRAST Result Date: 09/15/2024 CLINICAL DATA:  Abdominal pain with bright red blood per rectum beginning this morning. Patient on Xarelto . EXAM: CT ABDOMEN AND PELVIS WITH CONTRAST TECHNIQUE: Multidetector CT imaging of the abdomen and pelvis was performed using the standard protocol following bolus administration of intravenous contrast. RADIATION DOSE REDUCTION: This exam was performed according  to the departmental dose-optimization program which includes automated exposure control, adjustment of the mA and/or kV according to patient size and/or use of iterative reconstruction technique. CONTRAST:  80mL OMNIPAQUE  IOHEXOL  300 MG/ML  SOLN COMPARISON:  CT abdomen/pelvis 07/20/2022 FINDINGS: Lower chest: Heart is normal size. Calcified plaque over the 3 vessel coronary arteries. Minimal calcified plaque over the descending thoracic aorta. Visualized lung bases are clear. Hepatobiliary: Previous cholecystectomy. Liver is unremarkable. Post cholecystectomy prominence of the common bile duct and central intrahepatic ducts without significant change. Pancreas: Normal. Spleen: Normal. Adrenals/Urinary Tract: Adrenal glands are normal. Kidneys are normal in size. No evidence of nephrolithiasis. There are several subcentimeter bilateral renal cortical hypodensities too small to characterize, but likely cysts. Findings suggesting bilateral parapelvic renal cysts unchanged. Ureters and bladder are unremarkable. Stomach/Bowel: Postsurgical change over the stomach/left upper quadrant. Stomach is otherwise unchanged. Distal body of the stomach extends partially into a midline ventral hernia unchanged. Diverticula over the duodenal C sweep unchanged. Small bowel is otherwise  unremarkable. Prior appendectomy. Mild diverticulosis throughout the colon without active inflammation. Mild fecal retention over the right colon with stool in fluid-filled rectosigmoid and left colon. There is a crescentic hyperdensity over the rectosigmoid junction in the midline posterior pelvis measuring 7 x 14 mm possibly focus of acute hemorrhage punctate hyperdensity within the rectum slightly more inferiorly. Vascular/Lymphatic: Moderate calcified plaque over the abdominal aorta which is normal in caliber. Reproductive: Status post hysterectomy. No adnexal masses. Other: No significant free fluid. No free peritoneal air. Midline supraumbilical ventral hernia as described earlier containing partial segment of distal stomach. This is unchanged. Wide fascial defect abdominal wall hernia just left of midline and just above the umbilicus unchanged containing short-segment small bowel loop. Musculoskeletal: L1 compression fracture unchanged. IMPRESSION: 1. Crescentic hyperdensity over the rectosigmoid junction in the midline posterior pelvis measuring 7 x 14 mm possibly focus of acute hemorrhage. Punctate hyperdensity within the rectum slightly more inferiorly. 2. Mild diverticulosis throughout the colon without active inflammation. Mild fecal retention over the right colon with stool in fluid-filled rectosigmoid and left colon. 3. Stable midline supraumbilical ventral hernia containing partial segment of distal stomach. Stable wide fascial defect abdominal wall hernia just left of midline and just above the umbilicus containing short-segment small bowel loop unchanged. 4. Several subcentimeter bilateral renal cortical hypodensities too small to characterize, but likely cysts. Findings suggesting bilateral parapelvic renal cysts unchanged. 5. Aortic atherosclerosis. Atherosclerotic coronary artery disease. 6. Stable L1 compression fracture. Aortic Atherosclerosis (ICD10-I70.0). Critical Value/emergent results were  called by telephone at the time of interpretation on 09/15/2024 at 10:42 am to provider [facility] RAY , who verbally acknowledged these results. Electronically Signed   By: Toribio Agreste M.D.   On: 09/15/2024 10:44   DG Chest 1 View Result Date: 09/15/2024 CLINICAL DATA:  Dizziness, weakness EXAM: CHEST  1 VIEW COMPARISON:  August 12, 2022. FINDINGS: The heart size and mediastinal contours are within normal limits. Hypoinflation of the lungs is noted. Both lungs are clear. The visualized skeletal structures are unremarkable. IMPRESSION: No active disease. Electronically Signed   By: Lynwood Landy Raddle M.D.   On: 09/15/2024 08:53   Scheduled Meds:  amitriptyline   75 mg Oral QHS   atorvastatin   80 mg Oral QPM   Chlorhexidine  Gluconate Cloth  6 each Topical Daily   cyanocobalamin   5,000 mcg Oral Daily   famotidine   40 mg Oral QHS   insulin  aspart  0-20 Units Subcutaneous TID WC   mupirocin  ointment  1 Application  Nasal BID   Continuous Infusions:   LOS: 1 day   Alejandro Marker, DO [facility] Available via Epic secure chat 7am-7pm After these hours, please refer to coverage provider listed on [email or web address] 09/16/2024, 4:02 PM  "

## 2024-09-16 NOTE — Op Note (Signed)
 [facility] Patient Name: Mary Ferguson Procedure Date: 09/16/2024 MRN: [id number] Attending MD: Estefana Keas DO, DO, [id number] Date of Birth: 12/01/1950 CSN: [id number] Age: 74 Admit Type: Outpatient Procedure:                Flexible Sigmoidoscopy Indications:              Hematochezia Providers:                Estefana Keas DO, DO, Gregoria Pierce, RN, Felice Sar, Technician, Haskel Chris, Technician Referring MD:              Medicines:                See the Anesthesia note for documentation of the                            administered medications Complications:            No immediate complications. Estimated Blood Loss:     Estimated blood loss minimal, no active bleeding                            noted during endoscopic exam. Procedure:                Pre-Anesthesia Assessment:                           - ASA Grade Assessment: III - A patient with severe                            systemic disease.                           - The risks and benefits of the procedure and the                            sedation options and risks were discussed with the                            patient. All questions were answered and informed                            consent was obtained.                           After obtaining informed consent, the scope was                            passed under direct vision. The [id number]-[id number]                            ([id number]) Olympus Colonoscope was introduced                            through the anus and advanced to the the descending  colon. The flexible sigmoidoscopy was accomplished                            without difficulty. The patient tolerated the                            procedure well. The quality of the bowel                            preparation was poor. Scope In: 12:55:36 PM Scope Out: 1:26:15 PM Total Procedure Duration: 0 hours 30 minutes 39  seconds  Findings:      The perianal and digital rectal examinations were normal.      Red blood and multiple clots was found in the rectum, in the sigmoid       colon, in the descending colon, in the mid descending colon and in the       distal descending colon.      Multiple medium-mouthed and small-mouthed diverticula were found in the       recto-sigmoid colon, sigmoid colon and descending colon. Impression:               - Preparation of the colon was poor.                           - Blood in the rectum, in the sigmoid colon, in the                            descending colon, in the mid descending colon and                            in the distal descending colon.                           - Diverticulosis in the recto-sigmoid colon, in the                            sigmoid colon and in the descending colon.                           - No specimens collected. Moderate Sedation:      Monitored anesthesia care provided by anesthesia department. Recommendation:           - Return patient to hospital ward for ongoing care.                           - Clear liquid diet.                           - Monitor bowel movements. If further, large                            volume, bright red blood per rectum were to occur                            would recommend CT angiogram of abdomen and  pelvis                            for GI bleed. Procedure Code(s):        --- Professional ---                           [phone], Sigmoidoscopy, flexible; diagnostic,                            including collection of specimen(s) by brushing or                            washing, when performed (separate procedure) Diagnosis Code(s):        --- Professional ---                           K62.5, Hemorrhage of anus and rectum                           K92.2, Gastrointestinal hemorrhage, unspecified                           K92.1, Melena (includes Hematochezia)                           K57.30,  Diverticulosis of large intestine without                            perforation or abscess without bleeding CPT copyright 2022 [employer]. All rights reserved. The codes documented in this report are preliminary and upon coder review may  be revised to meet current compliance requirements. Dr Estefana Keas, DO Estefana Keas DO, DO 09/16/2024 1:39:03 PM Number of Addenda: 0

## 2024-09-16 NOTE — Hospital Course (Addendum)
 The Patient Mary Ferguson is a 74 y.o. 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   GI was consulted and took her for a Flex Sig and she had old blood and diverticulosis but no active signs of bleeding. After her Flex Sig she had a large BRBPR and became hypotensive.  She was stabilized after blood transfusion boluses and stat CTA showed no evidence of acute bleeding so IR is not intervening.  She underwent a GI prep for a colonoscopy today and prior to her colonoscopy she had another pretty significant bloody bowel movement blood pressure dropped again and hemoglobin started dropping.  She was given another bolus and given 3 units of PRBCs.  Colonoscopy showed that she had some diverticuli and there was evidence of recent bleeding in the descending colon so the GI physician placed 2 clips and recommended NPO.  The GI team then recommended making her n.p.o. and given this and her tenuous blood sugars we will place her on D5 half-normal saline at 75 mL/h.  On the morning of 09/18/2024  GI team is recommending advancing her diet to clears without subsequently she had strokelike symptoms so a code stroke was called.  She was worked up for this and negative for acute CVA.  Her diet was then readvanced to clears after SLP evaluation.  Assessment and Plan:  Acute Lower GI bleeding with associated Acute blood loss anemia (ABLA)/Symptomatic Anemia:  -Status post flex sigmoidoscopy and it was a poor prep so she  will be undergoing a colonoscopy today. -CT Abd/Pelvis done and showed Crescentic hyperdensity over the rectosigmoid junction in the midline posterior pelvis measuring 7 x 14 mm possibly a focus of acute hemorrhage. Punctate hyperdensity within the rectum slightly more inferiorly. Mild diverticulosis throughout the colon without active inflammation. Mild fecal retention over the right colon with stool in fluid-filled rectosigmoid and the left colon. Unchanged renal cyst. Aortic atherosclerosis. Atherosclerotic coronary artery disease. Stable L1 fracture. -Underwent Flex Sig which showed that preparation the colon was poor and that there was blood in the rectum and sigmoid colon and the descending colon as well as the mid descending colon and distal descending colon but no active bleeding.  Diverticulosis was noted in the rectosigmoid colon as well as in the sigmoid colon and the descending colon. -After the Flex Sig she had a large BRBPR Bowel Movement and was symptomatic and hypotenstive. 1/23 prior to Colonoscopy she had another bloody bowel movement (a pretty significant BM again with about 500 mL bloody maroon colored matter with lots of clot per nursing) -Hgb/Hct Trend:  Recent Labs  Lab 09/16/24 1553 09/16/24 1642 09/17/24 0535 09/17/24 0757 09/17/24 1212 09/17/24 1442 09/18/24 0255  HGB 8.4* 5.0* 10.9* 9.8* 9.0* 7.8* 10.9*  HCT 27.5* 15.8* 32.6* 31.3* 27.5* 23.0* 32.4*  MCV 87.3  --  84.9 87.7 85.7  --  84.6  -S/p multiple IVF boluses; S/p 6 units of pRBCs and 1u of FFP -STAT 09/16/24 CTA GI Bleed showed no active bleed and no acute extravasation on CTA so there was no IR intervention required -GI following and recommended CTA GI Bleed; Will Monitor H/H q6h  now -CTM in SDU given her Hypotension; -PT-INR was 19.6-1.6 and is now 16.0-1.2 -C/w Supportive Care and C/w Antiemetics w/ po/IV q6hprn Nausea -IR notified and will evaluate the patient but CTA showed no active Bleed -Given  Hypotension PCCM was consulted but BP improved with Transfusion so no pressors needed -Colonoscopy done after prep yesterday and showed that the preparation of the colon was inadequate and that there was diverticulosis in the sigmoid colon and the descending colon and the distal transverse colon.  In the descending colon there is evidence of recent bleeding for diverticular opening and clips were placed x 2. -The GI team recommended to keep the patient n.p.o. after the colonoscopy and this was done but now they are recommending CLD. Will Contiue CLD for now; discussed with the GI physician Dr. Dianna and they are recommending to continue to hold apixaban as we can and recommending discussing with the new GI physician Dr. Elicia next week about when it is safe to resume  Neuro-Deficit/Stroke Like Symptoms: Had a worsened facial droop and weakness; Stroke Alert called and Stroke Workup initiated. CT Head done and showed No acute intracranial abnormality. ASPECTS 10. Ordered CTA Head and Neck but cancelled by Tele Neuro. MRI done and showed No acute intracranial abnormality and  Moderate for age signal changes compatible with chronic small vessel disease are stable from 2023 MRI. -ECHO done and showed EF of 60-65% w/ No RWMA and showed G1DD -PT/OT/SLP -Check Lipid Panel and A1c in the AM   Essential Hypertension -> Hypotension: Continuing to Hold antihypertensives for now given her recent BRBPR and associated drop in Blood Pressure. CTM BP per Protocol. Last BP reading was improved and now elevated and was 169/77  CAD S/P PCI: Continue Atorvastatin  80 mg po q Evening. Continue to Hold beta-blocker, long-acting nitrates and DOAC given above    Aortic Atherosclerosis / Dyslipidemia, goal LDL below 70: Continue Atorvastatin  80 mg p.o. daily.   Obstructive Sleep Apnea (adult) (pediatric): May use CPAP at bedtime.   Moderate persistent asthma / COPD (chronic obstructive pulmonary disease) w/ Chronic  Respiratory Failure with Chronic 3 Liters of Supplemental O2 via [place] (HCC): -Change Albuterol  MDI to Albuterol  Neb 2.5 mg IH q4hprn Wheezing and SOB -CTM Respiratory Status    B12 Deficiency: Continue B12 supplementation with po Cyanocobalamin  5,000 mcg po Daily    History of Recurrent Deep Vein Thrombosis (DVT): Anticoagulation w/ Rivaroxaban  on hold due to GIB   Type II Diabetes Mellitus with Manifestations (HCC): On CLD but once stable will need to be on a [place] [employer]/ Carbohydrate modified diet. CBG monitoring with Resistant Novolog  SSI AC. Check hemoglobin A1c. CTM CBG's per Protocol. CBG Trend:  Recent Labs  Lab 09/16/24 1138 09/16/24 1615 09/16/24 2203 09/17/24 1127 09/17/24 1553 09/18/24 1129 09/18/24 1541  GLUCAP 106* 120* 91 102* 76 120* 91   PTSD: C/w Amitriptyline  75 mg po qHS  AKI on CKD Stage 3a / Metabolic Acidosis: BUN/Cr Trend improved: Recent Labs  Lab 09/15/24 0816 09/15/24 0818 09/16/24 0330 09/17/24 0535 09/17/24 1442 09/18/24 0255  BUN 31* 32* 27* 19 15 11   CREATININE 1.52* 1.60* 1.09* 0.99 1.10* 0.88  -Is a slight metabolic acidosis with a CO2 of 20, Anion Gap of 9, Chloride Level 111 -Avoid Nephrotoxic Medications, Contrast Dyes, Hypotension and Dehydration to Ensure Adequate Renal Perfusion and will need to Renally Adjust Meds. CTM & Trend Renal Fxn carefully & repeat CMP in the AM  Hypoalbuminemia: Patient's Albumin  Lvl went from 3.8 ->  3.1 -> 3.5 -> 3.2. CTM & Trend & repeat CMP in the AM   Class III (Morbid) Obesity: Complicates overall prognosis and care. Estimated body mass index is 45.91 kg/m as calculated from the following:   Height as of this encounter: 5' 2 (1.575 m).   Weight as of this encounter: 113.9 kg. Weight Loss and Dietary Counseling given
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
## 2024-09-17 ENCOUNTER — Encounter (HOSPITAL_COMMUNITY): Payer: Self-pay | Admitting: Internal Medicine

## 2024-09-17 ENCOUNTER — Inpatient Hospital Stay (HOSPITAL_COMMUNITY): Admitting: Anesthesiology

## 2024-09-17 ENCOUNTER — Encounter (HOSPITAL_COMMUNITY)
Admission: EM | Disposition: A | Discharge status: Home / Self Care | Payer: Self-pay | Source: Home / Self Care | Attending: Internal Medicine

## 2024-09-17 DIAGNOSIS — J42 Unspecified chronic bronchitis: Secondary | ICD-10-CM | POA: Diagnosis not present

## 2024-09-17 DIAGNOSIS — K5790 Diverticulosis of intestine, part unspecified, without perforation or abscess without bleeding: Secondary | ICD-10-CM | POA: Diagnosis not present

## 2024-09-17 DIAGNOSIS — I2510 Atherosclerotic heart disease of native coronary artery without angina pectoris: Secondary | ICD-10-CM | POA: Diagnosis not present

## 2024-09-17 DIAGNOSIS — E118 Type 2 diabetes mellitus with unspecified complications: Secondary | ICD-10-CM | POA: Diagnosis not present

## 2024-09-17 DIAGNOSIS — Z6841 Body Mass Index (BMI) 40.0 and over, adult: Secondary | ICD-10-CM | POA: Diagnosis not present

## 2024-09-17 DIAGNOSIS — K921 Melena: Secondary | ICD-10-CM

## 2024-09-17 DIAGNOSIS — E538 Deficiency of other specified B group vitamins: Secondary | ICD-10-CM | POA: Diagnosis not present

## 2024-09-17 DIAGNOSIS — J4542 Moderate persistent asthma with status asthmaticus: Secondary | ICD-10-CM | POA: Diagnosis not present

## 2024-09-17 DIAGNOSIS — I700 Atherosclerosis of aorta: Secondary | ICD-10-CM | POA: Diagnosis not present

## 2024-09-17 DIAGNOSIS — K922 Gastrointestinal hemorrhage, unspecified: Secondary | ICD-10-CM | POA: Diagnosis not present

## 2024-09-17 DIAGNOSIS — I10 Essential (primary) hypertension: Secondary | ICD-10-CM

## 2024-09-17 DIAGNOSIS — G4733 Obstructive sleep apnea (adult) (pediatric): Secondary | ICD-10-CM | POA: Diagnosis not present

## 2024-09-17 DIAGNOSIS — Z86718 Personal history of other venous thrombosis and embolism: Secondary | ICD-10-CM | POA: Diagnosis not present

## 2024-09-17 DIAGNOSIS — D62 Acute posthemorrhagic anemia: Secondary | ICD-10-CM | POA: Diagnosis not present

## 2024-09-17 LAB — COMPREHENSIVE METABOLIC PANEL WITH GFR
ALT: 12 U/L (ref 0–44)
AST: 22 U/L (ref 15–41)
Albumin: 3.5 g/dL (ref 3.5–5.0)
Alkaline Phosphatase: 72 U/L (ref 38–126)
Anion gap: 11 (ref 5–15)
BUN: 19 mg/dL (ref 8–23)
CO2: 19 mmol/L — ABNORMAL LOW (ref 22–32)
Calcium: 9.2 mg/dL (ref 8.9–10.3)
Chloride: 112 mmol/L — ABNORMAL HIGH (ref 98–111)
Creatinine, Ser: 0.99 mg/dL (ref 0.44–1.00)
GFR, Estimated: >60 mL/min
Glucose, Bld: 133 mg/dL — ABNORMAL HIGH (ref 70–99)
Potassium: 4.6 mmol/L (ref 3.5–5.1)
Sodium: 142 mmol/L (ref 135–145)
Total Bilirubin: 0.9 mg/dL (ref 0.0–1.2)
Total Protein: 5.8 g/dL — ABNORMAL LOW (ref 6.5–8.1)

## 2024-09-17 LAB — CBC WITH DIFFERENTIAL/PLATELET
Abs Immature Granulocytes: 0.04 K/uL (ref 0.00–0.07)
Abs Immature Granulocytes: 0.03 K/uL (ref 0.00–0.07)
Basophils Absolute: 0 K/uL (ref 0.0–0.1)
Basophils Absolute: 0 K/uL (ref 0.0–0.1)
Basophils Relative: 0 %
Basophils Relative: 0 %
Eosinophils Absolute: 0.3 K/uL (ref 0.0–0.5)
Eosinophils Absolute: 0.4 K/uL (ref 0.0–0.5)
Eosinophils Relative: 4 %
Eosinophils Relative: 5 %
HCT: 32.6 % — ABNORMAL LOW (ref 36.0–46.0)
HCT: 27.5 % — ABNORMAL LOW (ref 36.0–46.0)
Hemoglobin: 10.9 g/dL — ABNORMAL LOW (ref 12.0–15.0)
Hemoglobin: 9 g/dL — ABNORMAL LOW (ref 12.0–15.0)
Immature Granulocytes: 1 %
Immature Granulocytes: 0 %
Lymphocytes Relative: 22 %
Lymphocytes Relative: 24 %
Lymphs Abs: 1.5 K/uL (ref 0.7–4.0)
Lymphs Abs: 1.7 K/uL (ref 0.7–4.0)
MCH: 28.4 pg (ref 26.0–34.0)
MCH: 28 pg (ref 26.0–34.0)
MCHC: 33.4 g/dL (ref 30.0–36.0)
MCHC: 32.7 g/dL (ref 30.0–36.0)
MCV: 84.9 fL (ref 80.0–100.0)
MCV: 85.7 fL (ref 80.0–100.0)
Monocytes Absolute: 0.5 K/uL (ref 0.1–1.0)
Monocytes Absolute: 0.6 K/uL (ref 0.1–1.0)
Monocytes Relative: 8 %
Monocytes Relative: 8 %
Neutro Abs: 4.4 K/uL (ref 1.7–7.7)
Neutro Abs: 4.4 K/uL (ref 1.7–7.7)
Neutrophils Relative %: 65 %
Neutrophils Relative %: 63 %
Platelets: 137 K/uL — ABNORMAL LOW (ref 150–400)
Platelets: 170 K/uL (ref 150–400)
RBC: 3.84 MIL/uL — ABNORMAL LOW (ref 3.87–5.11)
RBC: 3.21 MIL/uL — ABNORMAL LOW (ref 3.87–5.11)
RDW: 16.5 % — ABNORMAL HIGH (ref 11.5–15.5)
RDW: 16.7 % — ABNORMAL HIGH (ref 11.5–15.5)
WBC: 6.8 K/uL (ref 4.0–10.5)
WBC: 7.1 K/uL (ref 4.0–10.5)
nRBC: 0 % (ref 0.0–0.2)
nRBC: 0 % (ref 0.0–0.2)

## 2024-09-17 LAB — CBC
HCT: 31.3 % — ABNORMAL LOW (ref 36.0–46.0)
Hemoglobin: 9.8 g/dL — ABNORMAL LOW (ref 12.0–15.0)
MCH: 27.5 pg (ref 26.0–34.0)
MCHC: 31.3 g/dL (ref 30.0–36.0)
MCV: 87.7 fL (ref 80.0–100.0)
Platelets: 160 K/uL (ref 150–400)
RBC: 3.57 MIL/uL — ABNORMAL LOW (ref 3.87–5.11)
RDW: 16.3 % — ABNORMAL HIGH (ref 11.5–15.5)
WBC: 6.7 K/uL (ref 4.0–10.5)
nRBC: 0 % (ref 0.0–0.2)

## 2024-09-17 LAB — FIBRINOGEN: Fibrinogen: 328 mg/dL (ref 210–475)

## 2024-09-17 LAB — POCT I-STAT, CHEM 8
BUN: 15 mg/dL (ref 8–23)
Calcium, Ion: 1.3 mmol/L (ref 1.15–1.40)
Chloride: 114 mmol/L — ABNORMAL HIGH (ref 98–111)
Creatinine, Ser: 1.1 mg/dL — ABNORMAL HIGH (ref 0.44–1.00)
Glucose, Bld: 96 mg/dL (ref 70–99)
HCT: 23 % — ABNORMAL LOW (ref 36.0–46.0)
Hemoglobin: 7.8 g/dL — ABNORMAL LOW (ref 12.0–15.0)
Potassium: 4.2 mmol/L (ref 3.5–5.1)
Sodium: 145 mmol/L (ref 135–145)
TCO2: 21 mmol/L — ABNORMAL LOW (ref 22–32)

## 2024-09-17 LAB — PROTIME-INR
INR: 1.2 (ref 0.8–1.2)
Prothrombin Time: 16 s — ABNORMAL HIGH (ref 11.4–15.2)

## 2024-09-17 LAB — APTT: aPTT: 24 s (ref 24–36)

## 2024-09-17 LAB — PHOSPHORUS: Phosphorus: 2.7 mg/dL (ref 2.5–4.6)

## 2024-09-17 LAB — GLUCOSE, CAPILLARY
Glucose-Capillary: 102 mg/dL — ABNORMAL HIGH (ref 70–99)
Glucose-Capillary: 76 mg/dL (ref 70–99)

## 2024-09-17 LAB — PREPARE RBC (CROSSMATCH)

## 2024-09-17 LAB — MAGNESIUM: Magnesium: 2.5 mg/dL — ABNORMAL HIGH (ref 1.7–2.4)

## 2024-09-17 MED ORDER — SODIUM CHLORIDE 0.9% FLUSH
10.0000 mL | Freq: Two times a day (BID) | INTRAVENOUS | Status: DC
  Administered 2024-09-17 (×2): 20 mL
  Administered 2024-09-18 – 2024-09-26 (×18): 10 mL

## 2024-09-17 MED ORDER — SODIUM CHLORIDE 0.9% IV SOLUTION: Freq: Once | INTRAVENOUS | Status: AC

## 2024-09-17 MED ORDER — PROPOFOL 10 MG/ML IV BOLUS
INTRAVENOUS | Status: DC | PRN
  Administered 2024-09-17: 20 mg via INTRAVENOUS

## 2024-09-17 MED ORDER — PROPOFOL 500 MG/50ML IV EMUL
INTRAVENOUS | Status: DC | PRN
  Administered 2024-09-17: 125 ug/kg/min via INTRAVENOUS

## 2024-09-17 MED ORDER — SODIUM CHLORIDE 0.9% FLUSH: 10.0000 mL | INTRAVENOUS | Status: DC | PRN

## 2024-09-17 MED ORDER — PHENYLEPHRINE 80 MCG/ML (10ML) SYRINGE FOR IV PUSH (FOR BLOOD PRESSURE SUPPORT)
PREFILLED_SYRINGE | INTRAVENOUS | Status: DC | PRN
  Administered 2024-09-17: 80 ug via INTRAVENOUS
  Administered 2024-09-17: 160 ug via INTRAVENOUS
  Administered 2024-09-17 (×2): 80 ug via INTRAVENOUS

## 2024-09-17 MED ORDER — SODIUM CHLORIDE 0.9% IV SOLUTION: Freq: Once | INTRAVENOUS | Status: DC

## 2024-09-17 MED ORDER — ALBUMIN HUMAN 5 % IV SOLN: INTRAVENOUS | Status: DC | PRN

## 2024-09-17 MED ORDER — DEXTROSE-SODIUM CHLORIDE 5-0.45 % IV SOLN: INTRAVENOUS | Status: AC

## 2024-09-17 MED ORDER — LIDOCAINE 2% (20 MG/ML) 5 ML SYRINGE
INTRAMUSCULAR | Status: DC | PRN
  Administered 2024-09-17: 40 mg via INTRAVENOUS

## 2024-09-17 MED ORDER — SODIUM CHLORIDE 0.9 % IV SOLN
INTRAVENOUS | Status: AC | PRN
  Administered 2024-09-17: 500 mL via INTRAMUSCULAR

## 2024-09-17 MED ORDER — EPHEDRINE SULFATE (PRESSORS) 25 MG/5ML IV SOSY
PREFILLED_SYRINGE | INTRAVENOUS | Status: DC | PRN
  Administered 2024-09-17 (×2): 5 mg via INTRAVENOUS

## 2024-09-17 MED ORDER — SODIUM CHLORIDE 0.9 % IV BOLUS
1000.0000 mL | Freq: Once | INTRAVENOUS | Status: AC
  Administered 2024-09-17: 1000 mL via INTRAVENOUS

## 2024-09-17 MED ORDER — VASOPRESSIN 20 UNIT/ML IV SOLN
INTRAVENOUS | Status: AC
  Filled 2024-09-17: qty 1

## 2024-09-17 MED ORDER — PHENYLEPHRINE HCL-NACL 20-0.9 MG/250ML-% IV SOLN
INTRAVENOUS | Status: DC | PRN
  Administered 2024-09-17: 50 ug/min via INTRAVENOUS

## 2024-09-17 NOTE — Progress Notes (Signed)
 PCCM Interval Progress Note  Blood pressure improved with blood transfusion and hemoglobin responded appropriately. Never required vasopressor support. PCCM will sign off but will continue to be available PRN.   Rexene LOISE Blush, PA-C [facility] 09/17/24 7:51 AM  Please see [email or web address] for pager details.  From 7A-7P if no response, please call [phone] After hours, please call ELink [phone]

## 2024-09-17 NOTE — Op Note (Signed)
 [facility] Patient Name: Mary Ferguson Procedure Date: 09/17/2024 MRN: [id number] Attending MD: Estefana Keas DO, DO, [id number] Date of Birth: 27-Oct-1950 CSN: [id number] Age: 74 Admit Type: Inpatient Procedure:                Colonoscopy Indications:              Hematochezia, Acute post hemorrhagic anemia,                            Diverticulosis of the colon Providers:                Estefana Keas DO, DO, Jacquelyn Jaci Pierce,                            RN, Curtistine Bishop, Technician Referring MD:              Medicines:                See the Anesthesia note for documentation of the                            administered medications Complications:            No immediate complications. Estimated Blood Loss:     Estimated blood loss was minimal. Initiation of                            blood product during procedure given anemia. Procedure:                Pre-Anesthesia Assessment:                           - ASA Grade Assessment: III - A patient with severe                            systemic disease.                           - The risks and benefits of the procedure and the                            sedation options and risks were discussed with the                            patient. All questions were answered and informed                            consent was obtained.                           After obtaining informed consent, the colonoscope                            was passed under direct vision. Throughout the                            procedure, the patient's blood pressure, pulse, and  oxygen saturations were monitored continuously. The                            [id number]-[id number] ([id number]) Olympus colonscope was                            introduced through the anus and advanced to the the                            terminal ileum, with identification of the                            appendiceal orifice and IC valve.  The colonoscopy                            was technically difficult and complex due to                            inadequate bowel prep, significant looping and a                            tortuous colon. Successful completion of the                            procedure was aided by applying abdominal pressure                            and lavage. The patient tolerated the procedure                            well. The quality of the bowel preparation was                            evaluated using the BBPS [facility] Bowel Preparation                            Scale) with scores of: Right Colon = 1 (portion of                            mucosa seen, but other areas not well seen due to                            staining, residual stool and/or opaque liquid),                            Transverse Colon = 1 (portion of mucosa seen, but                            other areas not well seen due to staining, residual                            stool and/or opaque liquid) and Left Colon = 2                            (  minor amount of residual staining, small fragments                            of stool and/or opaque liquid, but mucosa seen                            well). The total BBPS score equals 4. The quality                            of the bowel preparation was inadequate. The                            terminal ileum, ileocecal valve, appendiceal                            orifice, and rectum were photographed. Scope In: 2:19:34 PM Scope Out: 3:26:14 PM Scope Withdrawal Time: 0 hours 22 minutes 52 seconds  Total Procedure Duration: 1 hour 6 minutes 40 seconds  Findings:      The perianal and digital rectal examinations were normal.      Multiple medium-mouthed and small-mouthed diverticula were found in the       sigmoid colon, descending colon and distal transverse colon.      Multiple small-mouthed diverticula were found in the descending colon.       There was evidence of recent  bleeding from the diverticular opening. For       hemostasis, two hemostatic clips were successfully placed. There was no       bleeding at the end of the procedure.      The terminal ileum appeared normal. Impression:               - Preparation of the colon was inadequate.                           - Diverticulosis in the sigmoid colon, in the                            descending colon and in the distal transverse colon.                           - Diverticulosis in the descending colon. There was                            evidence of recent bleeding from the diverticular                            opening. Clips were placed.                           - The examined portion of the ileum was normal.                           - No specimens collected. Moderate Sedation:      Monitored anesthesia care provided by anesthesia department. Recommendation:           - Return patient  to hospital ward for ongoing care.                           - NPO.                           - Continue present medications.                           - Monitor bowel movements - given degree of                            bleeding, do expect to see further blood per rectum.                           - Trend H/H, transfuse for Hgb < 7. Procedure Code(s):        --- Professional ---                           [phone], Colonoscopy, flexible; with control of                            bleeding, any method Diagnosis Code(s):        --- Professional ---                           K57.31, Diverticulosis of large intestine without                            perforation or abscess with bleeding                           K92.1, Melena (includes Hematochezia)                           D62, Acute posthemorrhagic anemia                           K57.30, Diverticulosis of large intestine without                            perforation or abscess without bleeding CPT copyright 2022 [employer]. All rights  reserved. The codes documented in this report are preliminary and upon coder review may  be revised to meet current compliance requirements. Dr Estefana Keas, DO Estefana Keas DO, DO 09/17/2024 3:45:03 PM Number of Addenda: 0

## 2024-09-17 NOTE — Progress Notes (Signed)

## 2024-09-17 NOTE — Anesthesia Preprocedure Evaluation (Addendum)
 "                                  Anesthesia Evaluation  Patient identified by MRN, date of birth, ID band Patient awake    Reviewed: Allergy & Precautions, NPO status , Patient's Chart, lab work & pertinent test results  History of Anesthesia Complications (+) history of anesthetic complications (reports taking 2 days to wake up after endometriosis sugery a long time ago)  Airway Mallampati: III  TM Distance: >3 FB Neck ROM: Full    Dental  (+) Dental Advisory Given   Pulmonary asthma , sleep apnea , COPD (3L PRN),  oxygen dependent, former smoker   Pulmonary exam normal breath sounds clear to auscultation       Cardiovascular hypertension (carvedilol ), Pt. on home beta blockers + angina (last nitroglycerin  use 09/10/2024)  + CAD, + Past MI, + Cardiac Stents and + DVT (recurrent)   Rhythm:Regular Rate:Normal  HLD  LHC 11/30/2019: Previously placed Prox RCA to Mid RCA stent (unknown type) is widely patent.  Previously placed Dist RCA stent (unknown type) is widely patent.  Prox Cx to Mid Cx lesion is 30% stenosed.  Prox LAD lesion is 30% stenosed.  Mid LAD-1 lesion is 40% stenosed.  Mid LAD-2 lesion is 70% stenosed.  Dist LAD lesion is 40% stenosed.   Widely patent mid and distal RCA stents in the dominant RCA vessel.   Mild to moderate concomitant CAD with 30 and 40% proximal LAD stenoses, no change in the previously noted focal 70% mid LAD stenosis with 40% narrowing beyond this segment; normal small ramus intermediate vessel and tortuous circumflex vessel with 30% proximal narrowing.  TTE 06/19/2019: IMPRESSIONS     1. Left ventricular ejection fraction, by visual estimation, is 60 to  65%. The left ventricle has normal function. Normal left ventricular size.  There is severely increased left ventricular hypertrophy. The left  ventricular hypertrophy involves  basal-septum walls.   2. Left ventricular diastolic Doppler parameters are consistent  with  impaired relaxation pattern of LV diastolic filling.   3. Global right ventricle has normal systolic function.The right  ventricular size is normal. No increase in right ventricular wall  thickness.   4. Left atrial size was normal.   5. Right atrial size was normal.   6. Mild to moderate mitral annular calcification. No evidence of mitral  valve regurgitation. No evidence of mitral stenosis.   7. The tricuspid valve is normal in structure. Tricuspid valve  regurgitation was not visualized by color flow Doppler.   8. The aortic valve is tricuspid Aortic valve regurgitation was not  visualized by color flow Doppler. Mild aortic valve sclerosis without  stenosis.   9. The pulmonic valve was normal in structure. Pulmonic valve  regurgitation is not visualized by color flow Doppler.  10. The inferior vena cava is normal in size with greater than 50%  respiratory variability, suggesting right atrial pressure of 3 mmHg.     Neuro/Psych  Headaches, neg Seizures PSYCHIATRIC DISORDERS (PTSD) Anxiety Depression     Neuromuscular disease (lumbar radiculopathy)    GI/Hepatic Neg liver ROS,GERD  Medicated,,diverticulitis   Endo/Other  diabetes, Type 2  Class 3 obesity  Renal/GU CRFRenal disease     Musculoskeletal   Abdominal  (+) + obese  Peds  Hematology  (+) Blood dyscrasia, anemia Lab Results      Component  Value               Date                      WBC                      7.1                 09/17/2024                HGB                      9.0 (L)             09/17/2024                HCT                      27.5 (L)            09/17/2024                MCV                      85.7                09/17/2024                PLT                      170                 09/17/2024              Anesthesia Other Findings Last Xarelto : 09/14/2024  Reproductive/Obstetrics                              Anesthesia  Physical Anesthesia Plan  ASA: 3  Anesthesia Plan: MAC   Post-op Pain Management: Minimal or no pain anticipated   Induction: Intravenous  PONV Risk Score and Plan: 2 and Propofol  infusion, TIVA and Treatment may vary due to age or medical condition  Airway Management Planned: Natural Airway and Nasal Cannula  Additional Equipment:   Intra-op Plan:   Post-operative Plan:   Informed Consent: I have reviewed the patients History and Physical, chart, labs and discussed the procedure including the risks, benefits and alternatives for the proposed anesthesia with the patient or authorized representative who has indicated his/her understanding and acceptance.       Plan Discussed with: Anesthesiologist and CRNA  Anesthesia Plan Comments: (Discussed with patient risks of MAC including, but not limited to, minor pain or discomfort, hearing people in the room, and possible need for backup general anesthesia. Risks for general anesthesia also discussed including, but not limited to, sore throat, hoarse voice, chipped/damaged teeth, injury to vocal cords, nausea and vomiting, allergic reactions, lung infection, heart attack, stroke, and death. All questions answered. )        Anesthesia Quick Evaluation  "

## 2024-09-17 NOTE — Transfer of Care (Signed)
 Immediate Anesthesia Transfer of Care Note  Patient: Mary Ferguson  Procedure(s) Performed: COLONOSCOPY CONTROL OF HEMORRHAGE, GI TRACT, ENDOSCOPIC, BY CLIPPING OR OVERSEWING  Patient Location: Endoscopy Unit  Anesthesia Type:MAC  Level of Consciousness: awake  Airway & Oxygen Therapy: Patient Spontanous Breathing and Patient connected to face mask oxygen  Post-op Assessment: Report given to RN and Post -op Vital signs reviewed and stable  Post vital signs: Reviewed and stable  Last Vitals:  Vitals Value Taken Time  BP 90/35 09/17/24 15:45  Temp 36.2 C 09/17/24 15:45  Pulse 81 09/17/24 15:45  Resp 15 09/17/24 15:46  SpO2 99 % 09/17/24 15:45  Vitals shown include unfiled device data.  Last Pain:  Vitals:   09/17/24 1545  TempSrc: Temporal  PainSc: Asleep      Patients Stated Pain Goal: 6 (09/17/24 1327)  Complications: No notable events documented.

## 2024-09-17 NOTE — Progress Notes (Signed)
 " PROGRESS NOTE    Mary Ferguson  FMW:[id number] DOB: July 06, 1951 DOA: 09/15/2024 PCP: Catherine Charlies LABOR, DO   Brief Narrative:  The Patient Mary Ferguson is a 74 y.o. 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   GI was consulted and took her for a Flex Sig and she had old blood and diverticulosis but no active signs of bleeding. After her Flex Sig she had a large BRBPR and became hypotensive.  She was stabilized after blood transfusion boluses and stat CTA showed no evidence of acute bleeding so IR is not intervening.  She underwent a GI prep for a colonoscopy today and prior to her colonoscopy she had another pretty significant bloody bowel movement blood pressure dropped again and hemoglobin started dropping.  She was given another bolus and given 3 units of PRBCs.  Colonoscopy showed that she had some diverticuli and there was evidence of recent bleeding in the descending colon so the GI physician placed 2 clips and recommended NPO.  The GI team then recommended making her n.p.o. and given this and her tenuous blood sugars we will place her on D5 half-normal saline at 75 mL/h.  Assessment and Plan:  Acute Lower GI bleeding with associated Acute blood loss anemia (ABLA)/Symptomatic Anemia:  -Status post flex sigmoidoscopy and it was a poor prep so she will be undergoing a colonoscopy today. -CT Abd/Pelvis done and showed Crescentic hyperdensity over the rectosigmoid junction in the midline posterior  pelvis measuring 7 x 14 mm possibly a focus of acute hemorrhage. Punctate hyperdensity within the rectum slightly more inferiorly. Mild diverticulosis throughout the colon without active inflammation. Mild fecal retention over the right colon with stool in fluid-filled rectosigmoid and the left colon. Unchanged renal cyst. Aortic atherosclerosis. Atherosclerotic coronary artery disease. Stable L1 fracture. -Underwent Flex Sig which showed that preparation the colon was poor and that there was blood in the rectum and sigmoid colon and the descending colon as well as the mid descending colon and distal descending colon but no active bleeding.  Diverticulosis was noted in the rectosigmoid colon as well as in the sigmoid colon and the descending colon. -After the Flex Sig she had a large BRBPR Bowel Movement and was symptomatic and hypotenstive. Today prior to Colonoscopy she had another bloody bowel movement (a pretty significant BM again with about 500 mL bloody maroon colored matter with lots of clot per nursing) -Hgb/Hct Trend:  Recent Labs  Lab 09/16/24 0330 09/16/24 1553 09/16/24 1642 09/17/24 0535 09/17/24 0757 09/17/24 1212 09/17/24 1442  HGB 9.7* 8.4* 5.0* 10.9* 9.8* 9.0* 7.8*  HCT 31.7* 27.5* 15.8* 32.6* 31.3* 27.5* 23.0*  MCV 87.1 87.3  --  84.9 87.7 85.7  --   -Repeated 1 Liter Bolus x2 yesterday, Given another 1 Liter Bolus today  -STAT CTA GI Bleed showed no active bleed and no acute extravasation on CTA so there was no IR intervention required -GI following and recommended CTA GI Bleed; Will Monitor H/H q6h now -CTM in SDU given her Hypotension; Transfused 3 units of pRBCs yesterday and will transfuse 3 units today given acute Drop in Hgb Again -PT-INR was 19.6-1.6  and is now 16.0-1.2 -C/w Supportive Care and C/w Antiemetics w/ po/IV q6hprn Nausea -IR notified and will evaluate the patient but CTA showed no active Bleed -Given Hypotension PCCM was consulted but BP improved with  Transfusion so no pressors needed - Colonoscopy done after prep yesterday and showed that the preparation of the colon was inadequate and that there was diverticulosis in the sigmoid colon and the descending colon and the distal transverse colon.  In the descending colon there is evidence of recent bleeding for diverticular opening and clips were placed x 2. - The GI team recommends to keep the patient n.p.o. after the colonoscopy so we will start her on D5 half-normal saline at 75 mL/hr  Essential Hypertension -> Hypotension: Hold antihypertensives for now given her recent BRBPR and associated drop in Blood Pressure. CTM BP per Protocol. Last BP reading was improved to 128/54 but got as low as 90/35 when she had another bloody bowel movement  CAD S/P PCI: Continue Atorvastatin  80 mg po q Evening. Continue to Hold beta-blocker, long-acting nitrates and DOAC given above    Aortic Atherosclerosis / Dyslipidemia, goal LDL below 70: Continue Atorvastatin  80 mg p.o. daily.   Obstructive Sleep Apnea (adult) (pediatric): May use CPAP at bedtime.   Moderate persistent asthma / COPD (chronic obstructive pulmonary disease) w/ Chronic Respiratory Failure with Chronic 3 Liters of Supplemental O2 via  (HCC): -Change Albuterol  MDI to Albuterol  Neb 2.5 mg IH q4hprn Wheezing and SOB -CTM Respiratory Status    B12 Deficiency: Continue B12 supplementation with po Cyanocobalamin  5,000 mcg po Daily    History of Recurrent Deep Vein Thrombosis (DVT): Anticoagulation w/ Rivaroxaban  on hold due to GIB   Type II Diabetes Mellitus with Manifestations (HCC): On CLD but once stable will need to be on a [place]/ Carbohydrate modified diet. CBG monitoring with Resistant Novolog  SSI AC. Check hemoglobin A1c. CTM CBG's per Protocol. CBG Trend:  Recent Labs  Lab 09/15/24 2118 09/16/24 0734 09/16/24 1138 09/16/24 1615 09/16/24 2203 09/17/24 1127 09/17/24 1553  GLUCAP 128* 130* 106* 120* 91 102* 76   PTSD:  C/w Amitriptyline  75 mg po qHS  AKI on CKD Stage 3a / Metabolic Acidosis: BUN/Cr Trend improved: Recent Labs  Lab 09/15/24 0816 09/15/24 0818 09/16/24 0330 09/17/24 0535 09/17/24 1442  BUN 31* 32* 27* 19 15  CREATININE 1.52* 1.60* 1.09* 0.99 1.10*  -Is a slight metabolic acidosis with a CO2 of 20, Anion Gap of 9, Chloride Level 111 -Avoid Nephrotoxic Medications, Contrast Dyes, Hypotension and Dehydration to Ensure Adequate Renal Perfusion and will need to Renally Adjust Meds. CTM & Trend Renal Fxn carefully & repeat CMP in the AM  Hypoalbuminemia: Patient's Albumin  Lvl went from 3.8 -> 3.1 -> 3.5. CTM & Trend & repeat CMP in the AM   Class III (Morbid) Obesity: Complicates overall prognosis and care. Estimated body mass index is 45.91 kg/m as calculated from the following:   Height as of this encounter: 5' 2 (1.575 m).   Weight as of this encounter: 113.9 kg. Weight Loss and Dietary Counseling given   DVT prophylaxis: SCDs Start: 09/15/24 1131    Code Status: Full Code Family Communication: Discussed with patient's son at bedside  Disposition Plan:  Level of care: Stepdown Status is: Inpatient Remains inpatient appropriate because: Needs further clinical improvement and clearance by the specialist   Consultants:  Gastroenterology Interventional radiology PCCM  Procedures:  Flexible sigmoidoscopy Colonoscopy  Antimicrobials:  Anti-infectives (From admission, onward)    None  Subjective: Seen and examined at bedside and she was doing a bit better today after the blood but prior to her colonoscopy she had a very large bloody bowel movement again and became hypotensive and hemoglobin dropped.  She was given 3 PRBCs and another bolus of IV fluid.  The GI physician found that she had recent evidence of bleeding from her diverticula in the descending colon surgery and 2 clips were placed.  She denies any nausea or vomiting but continues to complain of chronic  abdominal discomfort and was also having some back discomfort which is chronic.  No other concerns or complaints at this time.  Objective: Vitals:   09/17/24 1708 09/17/24 1718 09/17/24 1741 09/17/24 1742  BP: (!) 131/53  (!) 128/54 (!) 128/54  Pulse:  84 76 78  Resp:  (!) 22 16 17   Temp: 98.2 F (36.8 C)  97.7 F (36.5 C)   TempSrc: Axillary  Oral   SpO2: 96% 95% 97% 94%  Weight:      Height:        Intake/Output Summary (Last 24 hours) at 09/17/2024 1821 Last data filed at 09/17/2024 1717 Gross per 24 hour  Intake 3379.5 ml  Output 1875 ml  Net 1504.5 ml   Filed Weights   09/15/24 0806 09/16/24 1223 09/17/24 1327  Weight: 116 kg 116 kg 113.9 kg   Examination: Physical Exam:  Constitutional: WN/WD morbidly obese chronically ill-appearing Caucasian female who appears calm and in no acute distress this morning Respiratory: Diminished to auscultation bilaterally, no wheezing, rales, rhonchi or crackles. Normal respiratory effort and patient is not tachypenic. No accessory muscle use.  Unlabored breathing Cardiovascular: RRR, no murmurs / rubs / gallops. S1 and S2 auscultated.  Mild extremity edema Abdomen: Soft, slightly-tender, distended secondary to body habitus.  Bowel sounds positive.  GU: Deferred. Musculoskeletal: No clubbing / cyanosis of digits/nails. No joint deformity upper and lower extremities.  Skin: No rashes, lesions, ulcers on limited skin evaluation. No induration; Warm and dry.  Neurologic: CN 2-12 grossly intact with no focal deficits. Romberg sign and cerebellar reflexes not assessed.  Psychiatric: Normal judgment and insight. Alert and oriented x 3. Normal mood and appropriate affect.   Data Reviewed: I have personally reviewed following labs and imaging studies  CBC: Recent Labs  Lab 09/15/24 0816 09/15/24 0818 09/16/24 0330 09/16/24 1553 09/16/24 1642 09/17/24 0535 09/17/24 0757 09/17/24 1212 09/17/24 1442  WBC 12.1*  --  8.2 7.5  --  6.8 6.7  7.1  --   NEUTROABS 9.3*  --   --  3.9  --  4.4  --  4.4  --   HGB 13.3   < > 9.7* 8.4* 5.0* 10.9* 9.8* 9.0* 7.8*  HCT 42.7   < > 31.7* 27.5* 15.8* 32.6* 31.3* 27.5* 23.0*  MCV 85.9  --  87.1 87.3  --  84.9 87.7 85.7  --   PLT 291  --  200 207  --  137* 160 170  --    < > = values in this interval not displayed.   Basic Metabolic Panel: Recent Labs  Lab 09/15/24 0816 09/15/24 0818 09/16/24 0330 09/17/24 0535 09/17/24 1442  NA 141 142 140 142 145  K 4.8 4.7 4.3 4.6 4.2  CL 108 107 111 112* 114*  CO2 24  --  20* 19*  --   GLUCOSE 241* 229* 139* 133* 96  BUN 31* 32* 27* 19 15  CREATININE 1.52* 1.60* 1.09* 0.99 1.10*  CALCIUM  10.4*  --  9.2 9.2  --   MG  --   --   --  2.5*  --   PHOS  --   --   --  2.7  --    GFR: Estimated Creatinine Clearance: 55.2 mL/min (A) (by C-G formula based on SCr of 1.1 mg/dL (H)). Liver Function Tests: Recent Labs  Lab 09/15/24 0816 09/16/24 0330 09/17/24 0535  AST 19 16 22   ALT 14 11 12   ALKPHOS 101 75 72  BILITOT 0.6 0.7 0.9  PROT 6.7 5.3* 5.8*  ALBUMIN  3.8 3.1* 3.5   No results for input(s): LIPASE, AMYLASE in the last 168 hours. No results for input(s): AMMONIA in the last 168 hours. Coagulation Profile: Recent Labs  Lab 09/15/24 0816 09/16/24 0330 09/17/24 0757  INR 2.1* 1.6* 1.2   Cardiac Enzymes: No results for input(s): CKTOTAL, CKMB, CKMBINDEX, TROPONINI in the last 168 hours. BNP (last 3 results) No results for input(s): PROBNP in the last 8760 hours. HbA1C: No results for input(s): HGBA1C in the last 72 hours. CBG: Recent Labs  Lab 09/16/24 1138 09/16/24 1615 09/16/24 2203 09/17/24 1127 09/17/24 1553  GLUCAP 106* 120* 91 102* 76   Lipid Profile: No results for input(s): CHOL, HDL, LDLCALC, TRIG, CHOLHDL, LDLDIRECT in the last 72 hours. Thyroid  Function Tests: No results for input(s): TSH, T4TOTAL, FREET4, T3FREE, THYROIDAB in the last 72 hours. Anemia Panel: No results  for input(s): VITAMINB12, FOLATE, FERRITIN, TIBC, IRON, RETICCTPCT in the last 72 hours. Sepsis Labs: No results for input(s): PROCALCITON, LATICACIDVEN in the last 168 hours.  Recent Results (from the past 240 hours)  Surgical PCR screen     Status: Abnormal   Collection Time: 09/15/24  5:55 PM   Specimen: Nasal Mucosa; Nasal Swab  Result Value Ref Range Status   MRSA, PCR NEGATIVE NEGATIVE Final   Staphylococcus aureus POSITIVE (A) NEGATIVE Final    Comment: (NOTE) The Xpert SA Assay (FDA approved for NASAL specimens in patients 20 years of age and older), is one component of a comprehensive surveillance program. It is not intended to diagnose infection nor to guide or monitor treatment. Performed at [facility], [address] [place], [place] [address]     Radiology Studies: CT ANGIO GI BLEED Result Date: 09/16/2024 CLINICAL DATA:  Bright red blood per rectum. EXAM: CTA ABDOMEN AND PELVIS WITHOUT AND WITH CONTRAST TECHNIQUE: Multidetector CT imaging of the abdomen and pelvis was performed using the standard protocol during bolus administration of intravenous contrast. Multiplanar reconstructed images and MIPs were obtained and reviewed to evaluate the vascular anatomy. RADIATION DOSE REDUCTION: This exam was performed according to the departmental dose-optimization program which includes automated exposure control, adjustment of the mA and/or kV according to patient size and/or use of iterative reconstruction technique. CONTRAST:  OMNIPAQUE  IOHEXOL  350 MG/ML SOLN COMPARISON:  CT abdomen pelvis dated 09/15/2024. FINDINGS: VASCULAR Aorta: Moderate atherosclerotic calcification. No aneurysmal dilatation or dissection. No periaortic fluid collection. Celiac: The celiac artery and its major branches appear patent. SMA: The SMA is patent. Renals: Atherosclerotic calcification of the renal arteries. The renal arteries remain patent. IMA: Nonopacification  of the origin of the IMA. There is reconstitution of flow within the IMA. Inflow: Moderate atherosclerotic calcification. No aneurysmal dilatation or dissection. The iliac arteries are patent. Proximal Outflow: The visualized proximal outflow is patent. Veins: The IVC is unremarkable. No portal venous gas. The SMV, splenic vein, and main portal vein are patent. Review of the MIP images confirms the above  findings. NON-VASCULAR Lower chest: No acute abnormality. No intra-abdominal free air or free fluid. Hepatobiliary: The liver is grossly unremarkable. Mild dilatation, post cholecystectomy. No retained calcified stone noted in the central CBD. Pancreas: Mild fatty atrophy. No active inflammatory changes. No dilatation of the main pancreatic duct. Spleen: Normal in size without focal abnormality. Adrenals/Urinary Tract: The adrenal glands are unremarkable. Mild bilateral renal parenchyma atrophy. There is no hydronephrosis or nephrolithiasis on either side. Bilateral parapelvic cysts. The visualized ureters and urinary bladder unremarkable. Stomach/Bowel: There is sigmoid diverticulosis and scattered colonic diverticula. There is no bowel obstruction or active inflammation. No evidence of active GI bleed. Appendectomy. Lymphatic: No adenopathy. Reproductive: Hysterectomy.  No suspicious adnexal masses. Other: Broad-based midline anterior abdominal wall hernia containing loops of small bowel. Musculoskeletal: Osteopenia with degenerative changes of the spine. No acute osseous pathology. IMPRESSION: 1. No acute intra-abdominal or pelvic pathology. No evidence of active GI bleed. 2. Distal colonic diverticulosis. No bowel obstruction. 3.  Aortic Atherosclerosis (ICD10-I70.0). Electronically Signed   By: Vanetta Chou M.D.   On: 09/16/2024 17:44   Scheduled Meds:  sodium chloride    Intravenous Once   amitriptyline   75 mg Oral QHS   atorvastatin   80 mg Oral QPM   Chlorhexidine  Gluconate Cloth  6 each Topical  Daily   cyanocobalamin   5,000 mcg Oral Daily   famotidine   40 mg Oral QHS   insulin  aspart  0-20 Units Subcutaneous TID WC   mupirocin  ointment  1 Application Nasal BID   sodium chloride  flush  10-40 mL Intracatheter Q12H   Continuous Infusions:  sodium chloride  10 mL/hr at 09/17/24 0600   dextrose  5 % and 0.45 % NaCl 75 mL/hr at 09/17/24 1717    LOS: 2 days   This patient is critically ill with multiple organ system failure and requires high complexity decision making for assessment and support, frequent evaluation and titration of therapies and application of advanced monitoring technologies and extensive interpretation of multiple databases.  CRITICAL CARE TIME: 41 nonconsecutive minutes devoted to patient care services described in this note including but not limited to reviewing the patient's chart, seeing and evaluating patient, coordinating care, updating the family at bedside and discussing with the appropriate consultants.  Alejandro Marker, DO [facility] Available via Epic secure chat 7am-7pm After these hours, please refer to coverage provider listed on [email or web address] 09/17/2024, 6:21 PM  "

## 2024-09-17 NOTE — Progress Notes (Signed)
 Patient had greater than 500 mL bloody BM out, current BP 102/46. Informed Dr. Sherrill. See orders for 1L NS bolus and STAT CBC.

## 2024-09-17 NOTE — Progress Notes (Signed)
 Pt fluctuating in orientation post-procedure. Initially oriented to person/place, but later needed prompting to answer. Consistently oriented to person, but unable to state place/situation. Slight facial droop noted, but not noticeably different from pre-procedure. Weak, but equal power in all limbs. CBG checked and 76. Discussed with Dr Keneth (MDA) who came to assess pt. Happy for pt to cont to be monitored on floor so long as no deterioration. Per CRNA- pt needed very little medication to be sedated. Vitals stable. Handed over concerns to ICU nurse who will cont to monitor.

## 2024-09-17 NOTE — H&P (View-Only) (Signed)
 [facility] Progress Note  SUBJECTIVE:   Interval history: Mary Ferguson was seen and evaluated today at bedside. Resting comfortably with son at bedside. Chronic abdominal pain, no worsened. No nausea or vomiting, tolerated bowel preparation for colonoscopy today. Hgb trend reviewed. No chest pain or shortness of breath.   Past Medical History:  Diagnosis Date   Abdominal wall hernia 01/27/2014   Formatting of this note might be different from the original.  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     Acute and chronic respiratory failure with hypoxia (HCC) 01/24/2018   Acute diverticulitis 07/20/2022   Acute kidney injury superimposed on chronic kidney disease 07/21/2022   Asthma    Blood in stool    Chicken pox    Chronic generalized abdominal pain    Chronic pain    CKD (chronic kidney disease), stage III (HCC)    Colon polyps    Compression fracture of L1 lumbar vertebra (HCC)    COPD (chronic obstructive pulmonary disease) (HCC)    COVID-19 2020   Depression    Diverticulitis    Very severe.  Has had 2 bowel resections and temporary colostomy in the past.   Fall at home, initial encounter 07/21/2022   Fecal incontinence    Frequent headaches    GERD (gastroesophageal reflux disease)    Heart attack (HCC)    Heart disease    High anion gap metabolic acidosis 07/21/2022   History of blood transfusion    Hyperlipemia    Hypertension    Incontinence of feces 12/11/2016   Pneumonia due to infectious organism 01/12/2018   Postmenopausal 03/21/2021   PTSD (post-traumatic stress disorder)    Recurrent deep vein thrombosis (DVT) (HCC)    x6   Rhabdomyolysis 07/21/2022   Urinary incontinence    Past Surgical History:  Procedure Laterality Date   ABDOMINAL HYSTERECTOMY  1995   APPENDECTOMY     CATARACT EXTRACTION Bilateral    CESAREAN SECTION  1984   CHOLECYSTECTOMY  2016   COLOSTOMY     and reversal   CORONARY STENT INTERVENTION N/A 06/18/2019    Procedure: CORONARY STENT INTERVENTION;  Surgeon: Mary Deatrice LABOR, MD;  [facility]: [facility];  Service: Cardiovascular;  Laterality: N/A;   ENDOMETRIAL ABLATION     HERNIA REPAIR  1985   1985 and 2016 (2016 ventral w/ incarceration) 33 surgeries   HYSTEROSCOPY WITH D & C     13   LAPAROSCOPIC LYSIS OF ADHESIONS     x2   LEFT HEART CATH AND CORONARY ANGIOGRAPHY N/A 06/18/2019   Procedure: LEFT HEART CATH AND CORONARY ANGIOGRAPHY;  Surgeon: Mary Deatrice LABOR, MD;  [facility]: [facility];  Service: Cardiovascular;  Laterality: N/A;   LEFT HEART CATH AND CORONARY ANGIOGRAPHY N/A 11/30/2019   Procedure: LEFT HEART CATH AND CORONARY ANGIOGRAPHY;  Surgeon: Mary Debby LABOR, MD;  Location: [facility];  Service: Cardiovascular;  Laterality: N/A;   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   TONSILLECTOMY  1959   VEIN SURGERY     laser x2   Current Facility-Administered Medications  Medication Dose Route Frequency Provider Last Rate Last Admin   0.9 %  sodium chloride  infusion  250 mL Intravenous Continuous Mary Cable [place], Ferguson 10 mL/hr at 09/17/24 0600 Infusion Verify at 09/17/24 0600   acetaminophen  (TYLENOL ) tablet 650 mg  650 mg Oral Q6H PRN Mary Estefana DEL, Ferguson       Or   acetaminophen  (TYLENOL ) suppository  650 mg  650 mg Rectal Q6H PRN Mary Ferguson       albuterol  (PROVENTIL ) (2.5 MG/3ML) 0.083% nebulizer solution 2.5 mg  2.5 mg Inhalation Q4H PRN Mary Ferguson       amitriptyline  (ELAVIL ) tablet 75 mg  75 mg Oral QHS Mary Ferguson   75 mg at 09/16/24 2145   atorvastatin  (LIPITOR ) tablet 80 mg  80 mg Oral QPM Mary Ferguson   80 mg at 09/16/24 1850   camphor-menthol  (SARNA) lotion   Topical PRN Mary Ferguson       Chlorhexidine  Gluconate Cloth 2 % PADS 6 each  6 each Topical Daily Mary Ferguson   6 each at 09/16/24 2150   cyanocobalamin  (VITAMIN B12) tablet 5,000 mcg  5,000 mcg Oral Daily Mary Ferguson   5,000 mcg at 09/17/24 9058   dicyclomine  (BENTYL ) tablet 20 mg  20 mg Oral QID PRN Mary Ferguson       famotidine  (PEPCID ) tablet 40 mg  40 mg Oral QHS Mary Ferguson   40 mg at 09/16/24 2145   hydrOXYzine  (ATARAX ) tablet 25 mg  25 mg Oral Q8H PRN Mary Ferguson   25 mg at 09/16/24 1448   insulin  aspart (novoLOG ) injection 0-20 Units  0-20 Units Subcutaneous TID WC Mary Ferguson   3 Units at 09/16/24 9153   mupirocin  ointment (BACTROBAN ) 2 % 1 Application  1 Application Nasal BID Mary Ferguson   1 Application at 09/17/24 9058   ondansetron  (ZOFRAN ) tablet 4 mg  4 mg Oral Q6H PRN Mary Ferguson       Or   ondansetron  (ZOFRAN ) injection 4 mg  4 mg Intravenous Q6H PRN Mary Ferguson   4 mg at 09/16/24 1620   sodium chloride  flush (NS) 0.9 % injection 10-40 mL  10-40 mL Intracatheter Q12H Mary Ferguson   20 mL at 09/17/24 9056   sodium chloride  flush (NS) 0.9 % injection 10-40 mL  10-40 mL Intracatheter PRN Mary Ferguson       Allergies as of 09/15/2024 - Review Complete 09/15/2024  Allergen Reaction Noted   Rofecoxib Anaphylaxis 02/18/2013   Metformin  and related Other (See Comments) 04/05/2022   Sulfonylureas Other (See Comments) 09/23/2022   Benadryl allergy [diphenhydramine hcl] Other (See Comments) 09/29/2018   Cephalexin Rash 09/29/2018   Gatifloxacin Other (See Comments) 12/30/2012   Review of Systems:  Review of Systems  Respiratory:  Negative for shortness of breath.   Cardiovascular:  Negative for chest pain.  Gastrointestinal:  Positive for abdominal pain and blood in stool. Negative for nausea and vomiting.    OBJECTIVE:   Temp:  [97.3 F (36.3 C)-98.6 F (37 C)] 97.8 F (36.6 C) (01/23 0800) Pulse Rate:  [54-95] 77 (01/23 1000) Resp:  [12-29] 16 (01/23 1000) BP: (51-140)/(34-76) 127/57 (01/23 1000) SpO2:  [85 %-100 %] 97 % (01/23 1000) Weight:  [883 kg] 116 kg (01/22 1223) Last  BM Date : 09/17/24 Physical Exam Constitutional:      General: She is not in acute distress.    Appearance: She is not ill-appearing, toxic-appearing or diaphoretic.  Cardiovascular:     Rate and Rhythm: Normal rate and regular rhythm.  Pulmonary:     Effort: No respiratory distress.     Breath sounds: Normal breath sounds.  Abdominal:     General: Bowel sounds  are normal. There is no distension.     Palpations: Abdomen is soft.     Tenderness: There is abdominal tenderness.  Skin:    General: Skin is warm and dry.     Coloration: Skin is pale.  Neurological:     Mental Status: She is alert.     Labs: Recent Labs    09/16/24 1553 09/16/24 1642 09/17/24 0535 09/17/24 0757  WBC 7.5  --  6.8 6.7  HGB 8.4* 5.0* 10.9* 9.8*  HCT 27.5* 15.8* 32.6* 31.3*  PLT 207  --  137* 160   BMET Recent Labs    09/15/24 0816 09/15/24 0818 09/16/24 0330 09/17/24 0535  NA 141 142 140 142  K 4.8 4.7 4.3 4.6  CL 108 107 111 112*  CO2 24  --  20* 19*  GLUCOSE 241* 229* 139* 133*  BUN 31* 32* 27* 19  CREATININE 1.52* 1.60* 1.09* 0.99  CALCIUM  10.4*  --  9.2 9.2   LFT Recent Labs    09/17/24 0535  PROT 5.8*  ALBUMIN  3.5  AST 22  ALT 12  ALKPHOS 72  BILITOT 0.9   PT/INR Recent Labs    09/16/24 0330 09/17/24 0757  LABPROT 19.6* 16.0*  INR 1.6* 1.2   Diagnostic imaging: CT ANGIO GI BLEED Result Date: 09/16/2024 CLINICAL DATA:  Bright red blood per rectum. EXAM: CTA ABDOMEN AND PELVIS WITHOUT AND WITH CONTRAST TECHNIQUE: Multidetector CT imaging of the abdomen and pelvis was performed using the standard protocol during bolus administration of intravenous contrast. Multiplanar reconstructed images and MIPs were obtained and reviewed to evaluate the vascular anatomy. RADIATION DOSE REDUCTION: This exam was performed according to the departmental dose-optimization program which includes automated exposure control, adjustment of the mA and/or kV according to patient size and/or use  of iterative reconstruction technique. CONTRAST:  OMNIPAQUE  IOHEXOL  350 MG/ML SOLN COMPARISON:  CT abdomen pelvis dated 09/15/2024. FINDINGS: VASCULAR Aorta: Moderate atherosclerotic calcification. No aneurysmal dilatation or dissection. No periaortic fluid collection. Celiac: The celiac artery and its major branches appear patent. SMA: The SMA is patent. Renals: Atherosclerotic calcification of the renal arteries. The renal arteries remain patent. IMA: Nonopacification of the origin of the IMA. There is reconstitution of flow within the IMA. Inflow: Moderate atherosclerotic calcification. No aneurysmal dilatation or dissection. The iliac arteries are patent. Proximal Outflow: The visualized proximal outflow is patent. Veins: The IVC is unremarkable. No portal venous gas. The SMV, splenic vein, and main portal vein are patent. Review of the MIP images confirms the above findings. NON-VASCULAR Lower chest: No acute abnormality. No intra-abdominal free air or free fluid. Hepatobiliary: The liver is grossly unremarkable. Mild dilatation, post cholecystectomy. No retained calcified stone noted in the central CBD. Pancreas: Mild fatty atrophy. No active inflammatory changes. No dilatation of the main pancreatic duct. Spleen: Normal in size without focal abnormality. Adrenals/Urinary Tract: The adrenal glands are unremarkable. Mild bilateral renal parenchyma atrophy. There is no hydronephrosis or nephrolithiasis on either side. Bilateral parapelvic cysts. The visualized ureters and urinary bladder unremarkable. Stomach/Bowel: There is sigmoid diverticulosis and scattered colonic diverticula. There is no bowel obstruction or active inflammation. No evidence of active GI bleed. Appendectomy. Lymphatic: No adenopathy. Reproductive: Hysterectomy.  No suspicious adnexal masses. Other: Broad-based midline anterior abdominal wall hernia containing loops of small bowel. Musculoskeletal: Osteopenia with degenerative changes  of the spine. No acute osseous pathology. IMPRESSION: 1. No acute intra-abdominal or pelvic pathology. No evidence of active GI bleed. 2. Distal colonic diverticulosis. No bowel  obstruction. 3.  Aortic Atherosclerosis (ICD10-I70.0). Electronically Signed   By: Vanetta Chou M.D.   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

## 2024-09-17 NOTE — Progress Notes (Signed)
 [facility] Progress Note  SUBJECTIVE:   Interval history: Mary Ferguson was seen and evaluated today at bedside. Resting comfortably with son at bedside. Chronic abdominal pain, no worsened. No nausea or vomiting, tolerated bowel preparation for colonoscopy today. Hgb trend reviewed. No chest pain or shortness of breath.   Past Medical History:  Diagnosis Date   Abdominal wall hernia 01/27/2014   Formatting of this note might be different from the original.  With incarceration. 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     Acute and chronic respiratory failure with hypoxia (HCC) 01/24/2018   Acute diverticulitis 07/20/2022   Acute kidney injury superimposed on chronic kidney disease 07/21/2022   Asthma    Blood in stool    Chicken pox    Chronic generalized abdominal pain    Chronic pain    CKD (chronic kidney disease), stage III (HCC)    Colon polyps    Compression fracture of L1 lumbar vertebra (HCC)    COPD (chronic obstructive pulmonary disease) (HCC)    COVID-19 2020   Depression    Diverticulitis    Very severe.  Has had 2 bowel resections and temporary colostomy in the past.   Fall at home, initial encounter 07/21/2022   Fecal incontinence    Frequent headaches    GERD (gastroesophageal reflux disease)    Heart attack (HCC)    Heart disease    High anion gap metabolic acidosis 07/21/2022   History of blood transfusion    Hyperlipemia    Hypertension    Incontinence of feces 12/11/2016   Pneumonia due to infectious organism 01/12/2018   Postmenopausal 03/21/2021   PTSD (post-traumatic stress disorder)    Recurrent deep vein thrombosis (DVT) (HCC)    x6   Rhabdomyolysis 07/21/2022   Urinary incontinence    Past Surgical History:  Procedure Laterality Date   ABDOMINAL HYSTERECTOMY  1995   APPENDECTOMY     CATARACT EXTRACTION Bilateral    CESAREAN SECTION  1984   CHOLECYSTECTOMY  2016   COLOSTOMY     and reversal   CORONARY STENT INTERVENTION N/A 06/18/2019    Procedure: CORONARY STENT INTERVENTION;  Surgeon: Darron Deatrice LABOR, MD;  Location: [facility];  Service: Cardiovascular;  Laterality: N/A;   ENDOMETRIAL ABLATION     HERNIA REPAIR  1985   1985 and 2016 (2016 ventral w/ incarceration) 33 surgeries   HYSTEROSCOPY WITH D & C     13   LAPAROSCOPIC LYSIS OF ADHESIONS     x2   LEFT HEART CATH AND CORONARY ANGIOGRAPHY N/A 06/18/2019   Procedure: LEFT HEART CATH AND CORONARY ANGIOGRAPHY;  Surgeon: Darron Deatrice LABOR, MD;  Location: [facility];  Service: Cardiovascular;  Laterality: N/A;   LEFT HEART CATH AND CORONARY ANGIOGRAPHY N/A 11/30/2019   Procedure: LEFT HEART CATH AND CORONARY ANGIOGRAPHY;  Surgeon: Burnard Debby LABOR, MD;  Location: [facility];  Service: Cardiovascular;  Laterality: N/A;   NASAL RECONSTRUCTION  [birth year]-27[birth year]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   TONSILLECTOMY  1959   VEIN SURGERY     laser x2   Current Facility-Administered Medications  Medication Dose Route Frequency Provider Last Rate Last Admin   0.9 %  sodium chloride  infusion  250 mL Intravenous Continuous Sherrill Cable [place], DO 10 mL/hr at 09/17/24 0600 Infusion Verify at 09/17/24 0600   acetaminophen  (TYLENOL ) tablet 650 mg  650 mg Oral Q6H PRN Kriss Estefana DEL, DO       Or   acetaminophen  (TYLENOL ) suppository  650 mg  650 mg Rectal Q6H PRN Kriss Estefana DEL, DO       albuterol  (PROVENTIL ) (2.5 MG/3ML) 0.083% nebulizer solution 2.5 mg  2.5 mg Inhalation Q4H PRN Kriss Estefana H, DO       amitriptyline  (ELAVIL ) tablet 75 mg  75 mg Oral QHS Kriss Estefana H, DO   75 mg at 09/16/24 2145   atorvastatin  (LIPITOR ) tablet 80 mg  80 mg Oral QPM Kriss Estefana DEL, DO   80 mg at 09/16/24 1850   camphor-menthol  (SARNA) lotion   Topical PRN Kriss Estefana DEL, DO       Chlorhexidine  Gluconate Cloth 2 % PADS 6 each  6 each Topical Daily Kriss Estefana DEL, DO   6 each at 09/16/24 2150   cyanocobalamin  (VITAMIN B12) tablet 5,000 mcg  5,000 mcg Oral Daily Kriss Estefana DEL, DO   5,000 mcg at 09/17/24 9058   dicyclomine  (BENTYL ) tablet 20 mg  20 mg Oral QID PRN Kriss Estefana DEL, DO       famotidine  (PEPCID ) tablet 40 mg  40 mg Oral QHS Kriss Estefana H, DO   40 mg at 09/16/24 2145   hydrOXYzine  (ATARAX ) tablet 25 mg  25 mg Oral Q8H PRN Kriss Estefana H, DO   25 mg at 09/16/24 1448   insulin  aspart (novoLOG ) injection 0-20 Units  0-20 Units Subcutaneous TID WC Kriss Estefana DEL, DO   3 Units at 09/16/24 9153   mupirocin  ointment (BACTROBAN ) 2 % 1 Application  1 Application Nasal BID Kriss Estefana DEL, DO   1 Application at 09/17/24 9058   ondansetron  (ZOFRAN ) tablet 4 mg  4 mg Oral Q6H PRN Kriss Estefana DEL, DO       Or   ondansetron  (ZOFRAN ) injection 4 mg  4 mg Intravenous Q6H PRN Kriss Estefana H, DO   4 mg at 09/16/24 1620   sodium chloride  flush (NS) 0.9 % injection 10-40 mL  10-40 mL Intracatheter Q12H Sheikh, Alejandro Conover, DO   20 mL at 09/17/24 9056   sodium chloride  flush (NS) 0.9 % injection 10-40 mL  10-40 mL Intracatheter PRN Sherrill Alejandro Latif, DO       Allergies as of 09/15/2024 - Review Complete 09/15/2024  Allergen Reaction Noted   Rofecoxib Anaphylaxis 02/18/2013   Metformin  and related Other (See Comments) 04/05/2022   Sulfonylureas Other (See Comments) 09/23/2022   Benadryl allergy [diphenhydramine hcl] Other (See Comments) 09/29/2018   Cephalexin Rash 09/29/2018   Gatifloxacin Other (See Comments) 12/30/2012   Review of Systems:  Review of Systems  Respiratory:  Negative for shortness of breath.   Cardiovascular:  Negative for chest pain.  Gastrointestinal:  Positive for abdominal pain and blood in stool. Negative for nausea and vomiting.    OBJECTIVE:   Temp:  [97.3 F (36.3 C)-98.6 F (37 C)] 97.8 F (36.6 C) (01/23 0800) Pulse Rate:  [54-95] 77 (01/23 1000) Resp:  [12-29] 16 (01/23 1000) BP: (51-140)/(34-76) 127/57 (01/23 1000) SpO2:  [85 %-100 %] 97 % (01/23 1000) Weight:  [883 kg] 116 kg (01/22 1223) Last  BM Date : 09/17/24 Physical Exam Constitutional:      General: She is not in acute distress.    Appearance: She is not ill-appearing, toxic-appearing or diaphoretic.  Cardiovascular:     Rate and Rhythm: Normal rate and regular rhythm.  Pulmonary:     Effort: No respiratory distress.     Breath sounds: Normal breath sounds.  Abdominal:     General: Bowel sounds  are normal. There is no distension.     Palpations: Abdomen is soft.     Tenderness: There is abdominal tenderness.  Skin:    General: Skin is warm and dry.     Coloration: Skin is pale.  Neurological:     Mental Status: She is alert.     Labs: Recent Labs    09/16/24 1553 09/16/24 1642 09/17/24 0535 09/17/24 0757  WBC 7.5  --  6.8 6.7  HGB 8.4* 5.0* 10.9* 9.8*  HCT 27.5* 15.8* 32.6* 31.3*  PLT 207  --  137* 160   BMET Recent Labs    09/15/24 0816 09/15/24 0818 09/16/24 0330 09/17/24 0535  NA 141 142 140 142  K 4.8 4.7 4.3 4.6  CL 108 107 111 112*  CO2 24  --  20* 19*  GLUCOSE 241* 229* 139* 133*  BUN 31* 32* 27* 19  CREATININE 1.52* 1.60* 1.09* 0.99  CALCIUM  10.4*  --  9.2 9.2   LFT Recent Labs    09/17/24 0535  PROT 5.8*  ALBUMIN  3.5  AST 22  ALT 12  ALKPHOS 72  BILITOT 0.9   PT/INR Recent Labs    09/16/24 0330 09/17/24 0757  LABPROT 19.6* 16.0*  INR 1.6* 1.2   Diagnostic imaging: CT ANGIO GI BLEED Result Date: 09/16/2024 CLINICAL DATA:  Bright red blood per rectum. EXAM: CTA ABDOMEN AND PELVIS WITHOUT AND WITH CONTRAST TECHNIQUE: Multidetector CT imaging of the abdomen and pelvis was performed using the standard protocol during bolus administration of intravenous contrast. Multiplanar reconstructed images and MIPs were obtained and reviewed to evaluate the vascular anatomy. RADIATION DOSE REDUCTION: This exam was performed according to the departmental dose-optimization program which includes automated exposure control, adjustment of the mA and/or kV according to patient size and/or use  of iterative reconstruction technique. CONTRAST:  OMNIPAQUE  IOHEXOL  350 MG/ML SOLN COMPARISON:  CT abdomen pelvis dated 09/15/2024. FINDINGS: VASCULAR Aorta: Moderate atherosclerotic calcification. No aneurysmal dilatation or dissection. No periaortic fluid collection. Celiac: The celiac artery and its major branches appear patent. SMA: The SMA is patent. Renals: Atherosclerotic calcification of the renal arteries. The renal arteries remain patent. IMA: Nonopacification of the origin of the IMA. There is reconstitution of flow within the IMA. Inflow: Moderate atherosclerotic calcification. No aneurysmal dilatation or dissection. The iliac arteries are patent. Proximal Outflow: The visualized proximal outflow is patent. Veins: The IVC is unremarkable. No portal venous gas. The SMV, splenic vein, and main portal vein are patent. Review of the MIP images confirms the above findings. NON-VASCULAR Lower chest: No acute abnormality. No intra-abdominal free air or free fluid. Hepatobiliary: The liver is grossly unremarkable. Mild dilatation, post cholecystectomy. No retained calcified stone noted in the central CBD. Pancreas: Mild fatty atrophy. No active inflammatory changes. No dilatation of the main pancreatic duct. Spleen: Normal in size without focal abnormality. Adrenals/Urinary Tract: The adrenal glands are unremarkable. Mild bilateral renal parenchyma atrophy. There is no hydronephrosis or nephrolithiasis on either side. Bilateral parapelvic cysts. The visualized ureters and urinary bladder unremarkable. Stomach/Bowel: There is sigmoid diverticulosis and scattered colonic diverticula. There is no bowel obstruction or active inflammation. No evidence of active GI bleed. Appendectomy. Lymphatic: No adenopathy. Reproductive: Hysterectomy.  No suspicious adnexal masses. Other: Broad-based midline anterior abdominal wall hernia containing loops of small bowel. Musculoskeletal: Osteopenia with degenerative changes  of the spine. No acute osseous pathology. IMPRESSION: 1. No acute intra-abdominal or pelvic pathology. No evidence of active GI bleed. 2. Distal colonic diverticulosis. No bowel  obstruction. 3.  Aortic Atherosclerosis (ICD10-I70.0). Electronically Signed   By: Vanetta Chou M.D.   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

## 2024-09-17 NOTE — Progress Notes (Signed)
 Patient has limited vasculature. Midline placed in left upper arm. If patient needs more access, a PICC/Central line should be considered.

## 2024-09-17 NOTE — Interval H&P Note (Signed)
 History and Physical Interval Note:  09/17/2024 1:18 PM  Mary Ferguson  has presented today for surgery, with the diagnosis of Hematochezia, diverticulosis.  The various methods of treatment have been discussed with the patient and family. After consideration of risks, benefits and other options for treatment, the patient has consented to  Procedures: COLONOSCOPY (N/A) as a surgical intervention.  The patient's history has been reviewed, patient examined, no change in status, stable for surgery.  I have reviewed the patient's chart and labs.  Questions were answered to the patient's satisfaction.     [name]

## 2024-09-18 ENCOUNTER — Encounter (HOSPITAL_COMMUNITY): Payer: Self-pay | Admitting: Internal Medicine

## 2024-09-18 ENCOUNTER — Inpatient Hospital Stay (HOSPITAL_COMMUNITY)

## 2024-09-18 DIAGNOSIS — Z86718 Personal history of other venous thrombosis and embolism: Secondary | ICD-10-CM | POA: Diagnosis not present

## 2024-09-18 DIAGNOSIS — R4781 Slurred speech: Secondary | ICD-10-CM | POA: Diagnosis not present

## 2024-09-18 DIAGNOSIS — R29818 Other symptoms and signs involving the nervous system: Secondary | ICD-10-CM | POA: Diagnosis not present

## 2024-09-18 DIAGNOSIS — I10 Essential (primary) hypertension: Secondary | ICD-10-CM | POA: Diagnosis not present

## 2024-09-18 DIAGNOSIS — I2510 Atherosclerotic heart disease of native coronary artery without angina pectoris: Secondary | ICD-10-CM | POA: Diagnosis not present

## 2024-09-18 DIAGNOSIS — R29810 Facial weakness: Secondary | ICD-10-CM

## 2024-09-18 DIAGNOSIS — J4542 Moderate persistent asthma with status asthmaticus: Secondary | ICD-10-CM | POA: Diagnosis not present

## 2024-09-18 DIAGNOSIS — I700 Atherosclerosis of aorta: Secondary | ICD-10-CM | POA: Diagnosis not present

## 2024-09-18 DIAGNOSIS — K922 Gastrointestinal hemorrhage, unspecified: Secondary | ICD-10-CM | POA: Diagnosis not present

## 2024-09-18 DIAGNOSIS — J42 Unspecified chronic bronchitis: Secondary | ICD-10-CM | POA: Diagnosis not present

## 2024-09-18 DIAGNOSIS — E785 Hyperlipidemia, unspecified: Secondary | ICD-10-CM | POA: Diagnosis not present

## 2024-09-18 DIAGNOSIS — Z6841 Body Mass Index (BMI) 40.0 and over, adult: Secondary | ICD-10-CM | POA: Diagnosis not present

## 2024-09-18 DIAGNOSIS — D62 Acute posthemorrhagic anemia: Secondary | ICD-10-CM | POA: Diagnosis not present

## 2024-09-18 DIAGNOSIS — R2990 Unspecified symptoms and signs involving the nervous system: Secondary | ICD-10-CM

## 2024-09-18 DIAGNOSIS — E538 Deficiency of other specified B group vitamins: Secondary | ICD-10-CM | POA: Diagnosis not present

## 2024-09-18 LAB — ECHOCARDIOGRAM COMPLETE
AR max vel: 2.19 cm2
AV Area VTI: 2.33 cm2
AV Area mean vel: 1.95 cm2
AV Mean grad: 5 mmHg
AV Peak grad: 9.2 mmHg
Ao pk vel: 1.52 m/s
Area-P 1/2: 2.86 cm2
Calc EF: 61.5 %
Height: 62 in
S' Lateral: 2.3 cm
Single Plane A2C EF: 58.6 %
Single Plane A4C EF: 62.6 %
Weight: 4016 [oz_av]

## 2024-09-18 LAB — COMPREHENSIVE METABOLIC PANEL WITH GFR
ALT: 9 U/L (ref 0–44)
AST: 16 U/L (ref 15–41)
Albumin: 3.2 g/dL — ABNORMAL LOW (ref 3.5–5.0)
Alkaline Phosphatase: 60 U/L (ref 38–126)
Anion gap: 7 (ref 5–15)
BUN: 11 mg/dL (ref 8–23)
CO2: 23 mmol/L (ref 22–32)
Calcium: 8.7 mg/dL — ABNORMAL LOW (ref 8.9–10.3)
Chloride: 113 mmol/L — ABNORMAL HIGH (ref 98–111)
Creatinine, Ser: 0.88 mg/dL (ref 0.44–1.00)
GFR, Estimated: >60 mL/min
Glucose, Bld: 124 mg/dL — ABNORMAL HIGH (ref 70–99)
Potassium: 3.7 mmol/L (ref 3.5–5.1)
Sodium: 142 mmol/L (ref 135–145)
Total Bilirubin: 0.9 mg/dL (ref 0.0–1.2)
Total Protein: 4.9 g/dL — ABNORMAL LOW (ref 6.5–8.1)

## 2024-09-18 LAB — CBC WITH DIFFERENTIAL/PLATELET
Abs Immature Granulocytes: 0.05 10*3/uL (ref 0.00–0.07)
Basophils Absolute: 0 10*3/uL (ref 0.0–0.1)
Basophils Relative: 0 %
Eosinophils Absolute: 0.4 10*3/uL (ref 0.0–0.5)
Eosinophils Relative: 6 %
HCT: 32.4 % — ABNORMAL LOW (ref 36.0–46.0)
Hemoglobin: 10.9 g/dL — ABNORMAL LOW (ref 12.0–15.0)
Immature Granulocytes: 1 %
Lymphocytes Relative: 24 %
Lymphs Abs: 1.6 10*3/uL (ref 0.7–4.0)
MCH: 28.5 pg (ref 26.0–34.0)
MCHC: 33.6 g/dL (ref 30.0–36.0)
MCV: 84.6 fL (ref 80.0–100.0)
Monocytes Absolute: 0.6 10*3/uL (ref 0.1–1.0)
Monocytes Relative: 9 %
Neutro Abs: 4.1 10*3/uL (ref 1.7–7.7)
Neutrophils Relative %: 60 %
Platelets: 141 10*3/uL — ABNORMAL LOW (ref 150–400)
RBC: 3.83 MIL/uL — ABNORMAL LOW (ref 3.87–5.11)
RDW: 16.1 % — ABNORMAL HIGH (ref 11.5–15.5)
WBC: 6.7 10*3/uL (ref 4.0–10.5)
nRBC: 0 % (ref 0.0–0.2)

## 2024-09-18 LAB — GLUCOSE, CAPILLARY
Glucose-Capillary: 120 mg/dL — ABNORMAL HIGH (ref 70–99)
Glucose-Capillary: 91 mg/dL (ref 70–99)
Glucose-Capillary: 85 mg/dL (ref 70–99)

## 2024-09-18 LAB — MAGNESIUM: Magnesium: 1.9 mg/dL (ref 1.7–2.4)

## 2024-09-18 LAB — PHOSPHORUS: Phosphorus: 3.1 mg/dL (ref 2.5–4.6)

## 2024-09-18 MED ORDER — NYSTATIN 100000 UNIT/GM EX CREA
1.0000 | TOPICAL_CREAM | Freq: Two times a day (BID) | CUTANEOUS | Status: DC
  Administered 2024-09-18 – 2024-09-27 (×11): 1 via TOPICAL
  Filled 2024-09-18 (×3): qty 30

## 2024-09-18 NOTE — Progress Notes (Signed)
 PT Cancellation Note  Patient Details Name: Mary Ferguson MRN: [id number] DOB: [birth year]   Cancelled Treatment:     PT order received but eval deferred - [facility] called this am and pt for CT.  Will follow   York Valliant 09/18/2024, 1:20 PM

## 2024-09-18 NOTE — Progress Notes (Signed)
 [facility] Gastroenterology Progress Note  Mary Ferguson 74 y.o. 05/11/1951   Subjective: Patient seen earlier this morning. Denies abdominal pain. Family in room.  Objective: Vital signs: Vitals:   09/18/24 0700 09/18/24 0800  BP: (!) 132/59 (!) 110/47  Pulse: 82 68  Resp: 16 15  Temp:  98 F (36.7 C)  SpO2: 93% 93%    Physical Exam: Gen: chronically ill-appearing, lethargic, obese, no acute distress  HEENT: anicteric sclera CV: RRR Chest: CTA B Abd: diffuse tenderness with guarding, soft, nondistended, +BS, obese Ext: no edema  Lab Results: Recent Labs    09/17/24 0535 09/17/24 1442 09/18/24 0255  NA 142 145 142  K 4.6 4.2 3.7  CL 112* 114* 113*  CO2 19*  --  23  GLUCOSE 133* 96 124*  BUN 19 15 11   CREATININE 0.99 1.10* 0.88  CALCIUM  9.2  --  8.7*  MG 2.5*  --  1.9  PHOS 2.7  --  3.1   Recent Labs    09/17/24 0535 09/18/24 0255  AST 22 16  ALT 12 9  ALKPHOS 72 60  BILITOT 0.9 0.9  PROT 5.8* 4.9*  ALBUMIN  3.5 3.2*   Recent Labs    09/17/24 1212 09/17/24 1442 09/18/24 0255  WBC 7.1  --  6.7  NEUTROABS 4.4  --  4.1  HGB 9.0* 7.8* 10.9*  HCT 27.5* 23.0* 32.4*  MCV 85.7  --  84.6  PLT 170  --  141*      Assessment/Plan: Diverticular bleed - s/p hemoclipping of bleeding diverticulum in descending colon. No signs of further bleeding. Hgb 10.9. Ok for clear liquids. Prior to this note being placed a code stroke was called and patient being evaluated for that by code stroke team. Agree with NPO. No new recs from us . [facility] will sign off. Call if questions.   Jerrell JAYSON Sol 09/18/2024, 9:46 AM  Questions please call [phone] ID: Mary Ferguson, female   DOB: 1951/07/24, 74 y.o.   MRN: [id number]

## 2024-09-18 NOTE — Evaluation (Signed)
 Clinical/Bedside Swallow Evaluation Patient Details  Name: Mary Ferguson MRN: [id number] Date of Birth: 11-19-1950  Today's Date: 09/18/2024 Time: SLP Start Time (ACUTE ONLY): 1315 SLP Stop Time (ACUTE ONLY): 1330 SLP Time Calculation (min) (ACUTE ONLY): 15 min  Past Medical History:  Past Medical History:  Diagnosis Date   Abdominal wall hernia 01/27/2014   Formatting of this note might be different from the original.  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     Acute and chronic respiratory failure with hypoxia (HCC) 01/24/2018   Acute diverticulitis 07/20/2022   Acute kidney injury superimposed on chronic kidney disease 07/21/2022   Asthma    Blood in stool    Chicken pox    Chronic generalized abdominal pain    Chronic pain    CKD (chronic kidney disease), stage III (HCC)    Colon polyps    Compression fracture of L1 lumbar vertebra (HCC)    COPD (chronic obstructive pulmonary disease) (HCC)    COVID-19 2020   Depression    Diverticulitis    Very severe.  Has had 2 bowel resections and temporary colostomy in the past.   Fall at home, initial encounter 07/21/2022   Fecal incontinence    Frequent headaches    GERD (gastroesophageal reflux disease)    Heart attack (HCC)    Heart disease    High anion gap metabolic acidosis 07/21/2022   History of blood transfusion    Hyperlipemia    Hypertension    Incontinence of feces 12/11/2016   Pneumonia due to infectious organism 01/12/2018   Postmenopausal 03/21/2021   PTSD (post-traumatic stress disorder)    Recurrent deep vein thrombosis (DVT) (HCC)    x6   Rhabdomyolysis 07/21/2022   Urinary incontinence    Past Surgical History:  Past Surgical History:  Procedure Laterality Date   ABDOMINAL HYSTERECTOMY  1995   APPENDECTOMY     CATARACT EXTRACTION Bilateral    CESAREAN SECTION  1984   CHOLECYSTECTOMY  2016   COLOSTOMY     and reversal   CORONARY STENT INTERVENTION N/A 06/18/2019   Procedure: CORONARY STENT  INTERVENTION;  Surgeon: Darron Deatrice LABOR, MD;  Location: [facility];  Service: Cardiovascular;  Laterality: N/A;   ENDOMETRIAL ABLATION     HERNIA REPAIR  1985   1985 and 2016 (2016 ventral w/ incarceration) 33 surgeries   HYSTEROSCOPY WITH D & C     13   LAPAROSCOPIC LYSIS OF ADHESIONS     x2   LEFT HEART CATH AND CORONARY ANGIOGRAPHY N/A 06/18/2019   Procedure: LEFT HEART CATH AND CORONARY ANGIOGRAPHY;  Surgeon: Darron Deatrice LABOR, MD;  Location: [facility];  Service: Cardiovascular;  Laterality: N/A;   LEFT HEART CATH AND CORONARY ANGIOGRAPHY N/A 11/30/2019   Procedure: LEFT HEART CATH AND CORONARY ANGIOGRAPHY;  Surgeon: Burnard Debby LABOR, MD;  Location: [facility];  Service: Cardiovascular;  Laterality: N/A;   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 female who presented to the hospital on 09/15/2024 for sudden onset painless hematochezia and no worsening in her chronic abdominal discomfort. GI consulted and she was admitted with acute lower GI bleeding with associated acute blood loss anemia. Flexible sigmoidoscopy 09/16/24 with old blood/diverticulosis, no sign of active bleed. CTA completed afterwards which did not show sign of active GI bleed. Colonoscopy completed 1/23  and clips placed for recent bleeding of diverticular opening and NPO status recommended. GI f/u on 1/24 and ok'd patient for clear liquids. Unfortunately, code stroke called for this patient shortly after GI visit and patient kept NPO. MRI and CTH did not show acute intracranial abnormality. SLP ordered for swallow evaluation. PMH: GERD, diverticulitis, CKD, chronic pain, abdominal wall hernia, PTSD, HTN, HLD, heart attack.    Assessment / Plan / Recommendation  Clinical Impression  Patient presents with an oral phase dysphagia as per this bedside swallow evaluation. She describes the sensation and  discomfort/soreness on her left side of face as feeling as if she hit it on something. SLP noted decreased lingual ROM and strength, mild decreased movement of left side labial and facial musculature. She took very small sips of thin liquids (water) via straw, not exhibiting any overt s/s but overall intake was limited. Mastication of ice was reduced. SLP recommending advance to clear liquids/thin consistency as per GI recs and will follow briefly.      Aspiration Risk  Mild aspiration risk    Diet Recommendation Thin liquid    Liquid Administration via: Cup;Straw Medication Administration: Other (Comment) (as tolerated) Supervision: Patient able to self feed Compensations: Slow rate;Small sips/bites Postural Changes: Seated upright at 90 degrees;Remain upright for at least 30 minutes after po intake    Other Recommendations Oral Care Recommendations: Oral care BID     Swallow Evaluation Recommendations     Assistance Recommended at Discharge    Functional Status Assessment Patient has had a recent decline in their functional status and demonstrates the ability to make significant improvements in function in a reasonable and predictable amount of time.  Frequency and Duration min 1 x/week  1 week       Prognosis Prognosis for improved oropharyngeal function: Good      Swallow Study   General Date of Onset: 09/18/24 HPI: Mary Ferguson is a 74 y.o. female who presented to the hospital on 09/15/2024 for sudden onset painless hematochezia and no worsening in her chronic abdominal discomfort. GI consulted and she was admitted with acute lower GI bleeding with associated acute blood loss anemia. Flexible sigmoidoscopy 09/16/24 with old blood/diverticulosis, no sign of active bleed. CTA completed afterwards which did not show sign of active GI bleed. Colonoscopy completed 1/23 and clips placed for recent bleeding of diverticular opening and NPO status recommended. GI f/u on 1/24 and ok'd patient  for clear liquids. Unfortunately, code stroke called for this patient shortly after GI visit and patient kept NPO. MRI and CTH did not show acute intracranial abnormality. SLP ordered for swallow evaluation. PMH: GERD, diverticulitis, CKD, chronic pain, abdominal wall hernia, PTSD, HTN, HLD, heart attack. Type of Study: Bedside Swallow Evaluation Previous Swallow Assessment: none found Diet Prior to this Study: NPO Temperature Spikes Noted: No Respiratory Status: Room air History of Recent Intubation: No Behavior/Cognition: Alert;Cooperative;Pleasant mood Oral Cavity Assessment: Within Functional Limits Oral Care Completed by SLP: No Oral Cavity - Dentition: Missing dentition;Adequate natural dentition Vision: Functional for self-feeding Self-Feeding Abilities: Able to feed self Baseline Vocal Quality: Low vocal intensity Volitional Cough: Weak Volitional Swallow: Able to elicit    Oral/Motor/Sensory Function Overall Oral Motor/Sensory Function: Mild impairment Facial ROM: Reduced left Facial Strength: Reduced left Facial Sensation: Reduced left Lingual ROM: Reduced right;Reduced left Lingual Symmetry: Within Functional Limits Lingual Strength: Reduced   Ice Chips Ice chips: Impaired Oral Phase Impairments: Impaired mastication   Thin Liquid Thin Liquid: Within functional limits Presentation: Self Fed;Straw  Nectar Thick     Honey Thick     Puree Puree: Not tested   Solid     Solid: Not tested     Norleen IVAR Blase, MA, CCC-SLP Speech Therapy  09/18/2024,2:36 PM

## 2024-09-18 NOTE — Progress Notes (Signed)
 " PROGRESS NOTE    Aurianna Earlywine  FMW:[id number] DOB: 1951/08/05 DOA: 09/15/2024 PCP: Catherine Charlies LABOR, DO   Brief Narrative:  The Patient Chrissa Meetze is a 74 y.o. 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   GI was consulted and took her for a Flex Sig and she had old blood and diverticulosis but no active signs of bleeding. After her Flex Sig she had a large BRBPR and became hypotensive.  She was stabilized after blood transfusion boluses and stat CTA showed no evidence of acute bleeding so IR is not intervening.  She underwent a GI prep for a colonoscopy today and prior to her colonoscopy she had another pretty significant bloody bowel movement blood pressure dropped again and hemoglobin started dropping.  She was given another bolus and given 3 units of PRBCs.  Colonoscopy showed that she had some diverticuli and there was evidence of recent bleeding in the descending colon so the GI physician placed 2 clips and recommended NPO.  The GI team then recommended making her n.p.o. and given this and her tenuous blood sugars we will place her on D5 half-normal saline at 75 mL/h.  On the morning of 09/18/2024  GI team is recommending advancing her diet to clears without subsequently she had strokelike symptoms so a code stroke was called.  She was worked up for this and negative for acute CVA.  Her diet was then readvanced to clears after SLP evaluation.  Assessment and Plan:  Acute Lower GI bleeding  with associated Acute blood loss anemia (ABLA)/Symptomatic Anemia:  -Status post flex sigmoidoscopy and it was a poor prep so she will be undergoing a colonoscopy today. -CT Abd/Pelvis done and showed Crescentic hyperdensity over the rectosigmoid junction in the midline posterior pelvis measuring 7 x 14 mm possibly a focus of acute hemorrhage. Punctate hyperdensity within the rectum slightly more inferiorly. Mild diverticulosis throughout the colon without active inflammation. Mild fecal retention over the right colon with stool in fluid-filled rectosigmoid and the left colon. Unchanged renal cyst. Aortic atherosclerosis. Atherosclerotic coronary artery disease. Stable L1 fracture. -Underwent Flex Sig which showed that preparation the colon was poor and that there was blood in the rectum and sigmoid colon and the descending colon as well as the mid descending colon and distal descending colon but no active bleeding.  Diverticulosis was noted in the rectosigmoid colon as well as in the sigmoid colon and the descending colon. -After the Flex Sig she had a large BRBPR Bowel Movement and was symptomatic and hypotenstive. 1/23 prior to Colonoscopy she had another bloody bowel movement (a pretty significant BM again with about 500 mL bloody maroon colored matter with lots of clot per nursing) -Hgb/Hct Trend:  Recent Labs  Lab 09/16/24 1553 09/16/24 1642 09/17/24 0535 09/17/24 0757 09/17/24 1212 09/17/24 1442 09/18/24 0255  HGB 8.4* 5.0* 10.9* 9.8* 9.0* 7.8* 10.9*  HCT 27.5* 15.8* 32.6* 31.3* 27.5* 23.0* 32.4*  MCV 87.3  --  84.9 87.7 85.7  --  84.6  -S/p multiple IVF boluses; S/p 6 units of pRBCs and 1u of FFP -STAT 09/16/24 CTA GI Bleed showed no active bleed  and no acute extravasation on CTA so there was no IR intervention required -GI following and recommended CTA GI Bleed; Will Monitor H/H q6h now -CTM in SDU given her Hypotension; -PT-INR was 19.6-1.6 and is now 16.0-1.2 -C/w Supportive Care  and C/w Antiemetics w/ po/IV q6hprn Nausea -IR notified and will evaluate the patient but CTA showed no active Bleed -Given Hypotension PCCM was consulted but BP improved with Transfusion so no pressors needed -Colonoscopy done after prep yesterday and showed that the preparation of the colon was inadequate and that there was diverticulosis in the sigmoid colon and the descending colon and the distal transverse colon.  In the descending colon there is evidence of recent bleeding for diverticular opening and clips were placed x 2. -The GI team recommended to keep the patient n.p.o. after the colonoscopy and this was done but now they are recommending CLD. Will Contiue CLD for now; discussed with the GI physician Dr. Dianna and they are recommending to continue to hold apixaban as we can and recommending discussing with the new GI physician Dr. Elicia next week about when it is safe to resume  Neuro-Deficit/Stroke Like Symptoms: Had a worsened facial droop and weakness; Stroke Alert called and Stroke Workup initiated. CT Head done and showed No acute intracranial abnormality. ASPECTS 10. Ordered CTA Head and Neck but cancelled by Tele Neuro. MRI done and showed No acute intracranial abnormality and  Moderate for age signal changes compatible with chronic small vessel disease are stable from 2023 MRI. -ECHO done and showed EF of 60-65% w/ No RWMA and showed G1DD -PT/OT/SLP -Check Lipid Panel and A1c in the AM   Essential Hypertension -> Hypotension: Continuing to Hold antihypertensives for now given her recent BRBPR and associated drop in Blood Pressure. CTM BP per Protocol. Last BP reading was improved and now elevated and was 169/77  CAD S/P PCI: Continue Atorvastatin  80 mg po q Evening. Continue to Hold beta-blocker, long-acting nitrates and DOAC given above    Aortic Atherosclerosis / Dyslipidemia, goal LDL below 70: Continue Atorvastatin  80 mg p.o. daily.   Obstructive Sleep Apnea (adult)  (pediatric): May use CPAP at bedtime.   Moderate persistent asthma / COPD (chronic obstructive pulmonary disease) w/ Chronic Respiratory Failure with Chronic 3 Liters of Supplemental O2 via [place] (HCC): -Change Albuterol  MDI to Albuterol  Neb 2.5 mg IH q4hprn Wheezing and SOB -CTM Respiratory Status    B12 Deficiency: Continue B12 supplementation with po Cyanocobalamin  5,000 mcg po Daily    History of Recurrent Deep Vein Thrombosis (DVT): Anticoagulation w/ Rivaroxaban  on hold due to GIB   Type II Diabetes Mellitus with Manifestations (HCC): On CLD but once stable will need to be on a [place] [employer]/ Carbohydrate modified diet. CBG monitoring with Resistant Novolog  SSI AC. Check hemoglobin A1c. CTM CBG's per Protocol. CBG Trend:  Recent Labs  Lab 09/16/24 1138 09/16/24 1615 09/16/24 2203 09/17/24 1127 09/17/24 1553 09/18/24 1129 09/18/24 1541  GLUCAP 106* 120* 91 102* 76 120* 91   PTSD: C/w Amitriptyline  75 mg po qHS  AKI on CKD Stage 3a / Metabolic Acidosis: BUN/Cr Trend improved: Recent Labs  Lab 09/15/24 0816 09/15/24 0818 09/16/24 0330 09/17/24 0535 09/17/24 1442 09/18/24 0255  BUN 31* 32* 27* 19 15 11   CREATININE 1.52* 1.60* 1.09* 0.99 1.10* 0.88  -Is a slight metabolic acidosis with a CO2 of 20, Anion Gap of 9, Chloride Level 111 -Avoid Nephrotoxic Medications, Contrast Dyes, Hypotension and Dehydration to Ensure Adequate Renal Perfusion and will need to  Renally Adjust Meds. CTM & Trend Renal Fxn carefully & repeat CMP in the AM  Hypoalbuminemia: Patient's Albumin  Lvl went from 3.8 -> 3.1 -> 3.5 -> 3.2. CTM & Trend & repeat CMP in the AM   Class III (Morbid) Obesity: Complicates overall prognosis and care. Estimated body mass index is 45.91 kg/m as calculated from the following:   Height as of this encounter: 5' 2 (1.575 m).   Weight as of this encounter: 113.9 kg. Weight Loss and Dietary Counseling given   DVT prophylaxis: SCDs Start: 09/15/24 1131    Code Status:  Full Code Family Communication: Son at Bedside and updated over the telephone as well  Disposition Plan:  Level of care: Stepdown Status is: Inpatient Remains inpatient appropriate because: Needs furtehr    Consultants:  TeleNeuro Gastroenterology PCCM IR  Procedures:  As delineated above Flexible sigmoidoscopy and then also colonoscopy  Antimicrobials:  Anti-infectives (From admission, onward)    None       Subjective: Seen and examined at bedside and she states that her belly was hurting today.  Noted to have worsening facial droop and was slurring some of her words so a code stroke was called.  Objective: Vitals:   09/18/24 1400 09/18/24 1500 09/18/24 1600 09/18/24 1700  BP: (!) 145/74 (!) 144/51 (!) 132/57 (!) 169/77  Pulse: 84  82 85  Resp: (!) 24 14 18 17   Temp:   99 F (37.2 C)   TempSrc:   Axillary   SpO2: 96%  95% 94%  Weight:      Height:        Intake/Output Summary (Last 24 hours) at 09/18/2024 1734 Last data filed at 09/18/2024 1534 Gross per 24 hour  Intake 3198.26 ml  Output 1300 ml  Net 1898.26 ml   Filed Weights   09/15/24 0806 09/16/24 1223 09/17/24 1327  Weight: 116 kg 116 kg 113.9 kg   Examination: Physical Exam:  Constitutional: WN/WD morbidly obese chronically ill-appearing Caucasian female who has a left-sided facial droop Respiratory: Diminished to auscultation bilaterally, no wheezing, rales, rhonchi or crackles. Normal respiratory effort and patient is not tachypenic. No accessory muscle use.  Unlabored breathing Cardiovascular: RRR, no murmurs / rubs / gallops. S1 and S2 auscultated. No extremity edema. 2+ pedal pulses. No carotid bruits.  Abdomen: Soft, tender to palpate and distended secondary body habitus.  Bowel sounds positive.  GU: Deferred. Musculoskeletal: No clubbing / cyanosis of digits/nails. No joint deformity upper and lower extremities. Skin: No rashes, lesions, ulcers on limited skin evaluation. No induration; Warm  and dry.  Neurologic: Left-sided facial droop noted and she has weakness on the right left grip.  Had some slurred speech Psychiatric: Little anxious.  She is awake and alert  Data Reviewed: I have personally reviewed following labs and imaging studies  CBC: Recent Labs  Lab 09/15/24 0816 09/15/24 0818 09/16/24 1553 09/16/24 1642 09/17/24 0535 09/17/24 0757 09/17/24 1212 09/17/24 1442 09/18/24 0255  WBC 12.1*   < > 7.5  --  6.8 6.7 7.1  --  6.7  NEUTROABS 9.3*  --  3.9  --  4.4  --  4.4  --  4.1  HGB 13.3   < > 8.4*   < > 10.9* 9.8* 9.0* 7.8* 10.9*  HCT 42.7   < > 27.5*   < > 32.6* 31.3* 27.5* 23.0* 32.4*  MCV 85.9   < > 87.3  --  84.9 87.7 85.7  --  84.6  PLT 291   < >  207  --  137* 160 170  --  141*   < > = values in this interval not displayed.   Basic Metabolic Panel: Recent Labs  Lab 09/15/24 0816 09/15/24 0818 09/16/24 0330 09/17/24 0535 09/17/24 1442 09/18/24 0255  NA 141 142 140 142 145 142  K 4.8 4.7 4.3 4.6 4.2 3.7  CL 108 107 111 112* 114* 113*  CO2 24  --  20* 19*  --  23  GLUCOSE 241* 229* 139* 133* 96 124*  BUN 31* 32* 27* 19 15 11   CREATININE 1.52* 1.60* 1.09* 0.99 1.10* 0.88  CALCIUM  10.4*  --  9.2 9.2  --  8.7*  MG  --   --   --  2.5*  --  1.9  PHOS  --   --   --  2.7  --  3.1   GFR: Estimated Creatinine Clearance: 69 mL/min (by C-G formula based on SCr of 0.88 mg/dL). Liver Function Tests: Recent Labs  Lab 09/15/24 0816 09/16/24 0330 09/17/24 0535 09/18/24 0255  AST 19 16 22 16   ALT 14 11 12 9   ALKPHOS 101 75 72 60  BILITOT 0.6 0.7 0.9 0.9  PROT 6.7 5.3* 5.8* 4.9*  ALBUMIN  3.8 3.1* 3.5 3.2*   No results for input(s): LIPASE, AMYLASE in the last 168 hours. No results for input(s): AMMONIA in the last 168 hours. Coagulation Profile: Recent Labs  Lab 09/15/24 0816 09/16/24 0330 09/17/24 0757  INR 2.1* 1.6* 1.2   Cardiac Enzymes: No results for input(s): CKTOTAL, CKMB, CKMBINDEX, TROPONINI in the last 168 hours. BNP  (last 3 results) No results for input(s): PROBNP in the last 8760 hours. HbA1C: No results for input(s): HGBA1C in the last 72 hours. CBG: Recent Labs  Lab 09/16/24 2203 09/17/24 1127 09/17/24 1553 09/18/24 1129 09/18/24 1541  GLUCAP 91 102* 76 120* 91   Lipid Profile: No results for input(s): CHOL, HDL, LDLCALC, TRIG, CHOLHDL, LDLDIRECT in the last 72 hours. Thyroid  Function Tests: No results for input(s): TSH, T4TOTAL, FREET4, T3FREE, THYROIDAB in the last 72 hours. Anemia Panel: No results for input(s): VITAMINB12, FOLATE, FERRITIN, TIBC, IRON, RETICCTPCT in the last 72 hours. Sepsis Labs: No results for input(s): PROCALCITON, LATICACIDVEN in the last 168 hours.  Recent Results (from the past 240 hours)  Surgical PCR screen     Status: Abnormal   Collection Time: 09/15/24  5:55 PM   Specimen: Nasal Mucosa; Nasal Swab  Result Value Ref Range Status   MRSA, PCR NEGATIVE NEGATIVE Final   Staphylococcus aureus POSITIVE (A) NEGATIVE Final    Comment: (NOTE) The Xpert SA Assay (FDA approved for NASAL specimens in patients 30 years of age and older), is one component of a comprehensive surveillance program. It is not intended to diagnose infection nor to guide or monitor treatment. Performed at [facility], [address] [place], [place] [address]     Radiology Studies: ECHOCARDIOGRAM COMPLETE Result Date: 09/18/2024    ECHOCARDIOGRAM REPORT   Patient Name:   NYSA SARIN Date of Exam: 09/18/2024 Medical Rec #:  [id number]   Height:       62.0 in Accession #:    [id number]  Weight:       251.0 lb Date of Birth:  10/15/1950   BSA:          2.105 m Patient Age:    73 years    BP:           163/150 mmHg Patient Gender:  F           HR:           70 bpm. Exam Location:  Inpatient Procedure: 2D Echo, Color Doppler and Cardiac Doppler (Both Spectral and Color            Flow Doppler were utilized during procedure).  Indications:    Stroke I63.9  History:        Patient has prior history of Echocardiogram examinations, most                 recent 06/18/2019. Previous Myocardial Infarction; Risk                 Factors:Hypertension.  Sonographer:    Sydnee Wilson RDCS Referring Phys: [id number] ALEJANDRO LATIF [facility]  Sonographer Comments: Image acquisition challenging due to patient body habitus. IMPRESSIONS  1. Left ventricular ejection fraction, by estimation, is 60 to 65%. The left ventricle has normal function. The left ventricle has no regional wall motion abnormalities. Left ventricular diastolic parameters are consistent with Grade I diastolic dysfunction (impaired relaxation).  2. Right ventricular systolic function is normal. The right ventricular size is normal.  3. Left atrial size was mildly dilated.  4. The mitral valve is normal in structure. No evidence of mitral valve regurgitation. No evidence of mitral stenosis.  5. The aortic valve is tricuspid. There is mild calcification of the aortic valve. Aortic valve regurgitation is not visualized. Aortic valve sclerosis/calcification is present, without any evidence of aortic stenosis.  6. The inferior vena cava is normal in size with greater than 50% respiratory variability, suggesting right atrial pressure of 3 mmHg. Comparison(s): No significant change from prior study. Prior images reviewed side by side. FINDINGS  Left Ventricle: Left ventricular ejection fraction, by estimation, is 60 to 65%. The left ventricle has normal function. The left ventricle has no regional wall motion abnormalities. The left ventricular internal cavity size was normal in size. There is  no left ventricular hypertrophy. Left ventricular diastolic parameters are consistent with Grade I diastolic dysfunction (impaired relaxation). Right Ventricle: The right ventricular size is normal. No increase in right ventricular wall thickness. Right ventricular systolic function is normal. Left Atrium: Left  atrial size was mildly dilated. Right Atrium: Right atrial size was normal in size. Pericardium: There is no evidence of pericardial effusion. Mitral Valve: The mitral valve is normal in structure. Mild mitral annular calcification. No evidence of mitral valve regurgitation. No evidence of mitral valve stenosis. Tricuspid Valve: The tricuspid valve is normal in structure. Tricuspid valve regurgitation is not demonstrated. No evidence of tricuspid stenosis. Aortic Valve: The aortic valve is tricuspid. There is mild calcification of the aortic valve. Aortic valve regurgitation is not visualized. Aortic valve sclerosis/calcification is present, without any evidence of aortic stenosis. Aortic valve mean gradient measures 5.0 mmHg. Aortic valve peak gradient measures 9.2 mmHg. Aortic valve area, by VTI measures 2.33 cm. Pulmonic Valve: The pulmonic valve was not well visualized. Pulmonic valve regurgitation is not visualized. No evidence of pulmonic stenosis. Aorta: The aortic root and ascending aorta are structurally normal, with no evidence of dilitation. Venous: The inferior vena cava is normal in size with greater than 50% respiratory variability, suggesting right atrial pressure of 3 mmHg. IAS/Shunts: No atrial level shunt detected by color flow Doppler.  LEFT VENTRICLE PLAX 2D LVIDd:         3.20 cm      Diastology LVIDs:         2.30  cm      LV e' medial:    13.50 cm/s LV PW:         1.20 cm      LV E/e' medial:  4.5 LV IVS:        0.90 cm      LV e' lateral:   7.83 cm/s LVOT diam:     2.00 cm      LV E/e' lateral: 7.8 LV SV:         77 LV SV Index:   36 LVOT Area:     3.14 cm  LV Volumes (MOD) LV vol d, MOD A2C: 84.8 ml LV vol d, MOD A4C: 114.0 ml LV vol s, MOD A2C: 35.1 ml LV vol s, MOD A4C: 42.6 ml LV SV MOD A2C:     49.7 ml LV SV MOD A4C:     114.0 ml LV SV MOD BP:      62.8 ml RIGHT VENTRICLE RV S prime:     15.40 cm/s TAPSE (M-mode): 2.0 cm LEFT ATRIUM             Index        RIGHT ATRIUM           Index  LA diam:        4.00 cm 1.90 cm/m   RA Area:     14.00 cm LA Vol (A2C):   44.9 ml 21.33 ml/m  RA Volume:   33.80 ml  16.05 ml/m LA Vol (A4C):   44.6 ml 21.18 ml/m LA Biplane Vol: 47.2 ml 22.42 ml/m  AORTIC VALVE AV Area (Vmax):    2.19 cm AV Area (Vmean):   1.95 cm AV Area (VTI):     2.33 cm AV Vmax:           152.00 cm/s AV Vmean:          104.000 cm/s AV VTI:            0.329 m AV Peak Grad:      9.2 mmHg AV Mean Grad:      5.0 mmHg LVOT Vmax:         106.00 cm/s LVOT Vmean:        64.500 cm/s LVOT VTI:          0.244 m LVOT/AV VTI ratio: 0.74  AORTA Ao Root diam: 3.40 cm Ao Asc diam:  2.80 cm MITRAL VALVE MV Area (PHT): 2.86 cm    SHUNTS MV E velocity: 60.80 cm/s  Systemic VTI:  0.24 m MV A velocity: 92.50 cm/s  Systemic Diam: 2.00 cm MV E/A ratio:  0.66 Mihai Croitoru MD Electronically signed by Jerel Balding MD Signature Date/Time: 09/18/2024/1:54:07 PM    Final    MR BRAIN WO CONTRAST Result Date: 09/18/2024 EXAM: MRI BRAIN WITHOUT CONTRAST 09/18/2024 10:41:00 AM TECHNIQUE: Multiplanar multisequence MRI of the head/brain was performed without the administration of intravenous contrast. Axial T1 weighted imaging is degraded by motion despite repeated imaging attempts. COMPARISON: CT head 09/18/2024, brain MRI 07/21/2022. CLINICAL HISTORY: 74 year old female with acute neurological deficit, suspected stroke, and code stroke presentation. FINDINGS: BRAIN AND VENTRICLES: On diffusion weighted imaging, there is mild susceptibility artifact associated with bulky falcine and left tentorial calcifications. No acute infarct. No intracranial hemorrhage. No mass. No midline shift. No hydrocephalus. The sella is unremarkable. Normal flow voids. Some generalized intracranial artery tortuosity. Susceptibility associated with the rounded calcification pattern near the left tentorium is again noted. Scattered small periventricular and subcortical  white matter T2 and FLAIR hyperintensity in both hemispheres has  not significantly changed, mild to moderate for age. Similar T2 heterogeneity in the deep gray matter nuclei and pons also stable. No cervical encephalomalacia or chronic cerebral blood products identified. ORBITS: No significant abnormality. SINUSES AND MASTOIDS: Mild left mastoid effusion has not significantly changed. BONES AND SOFT TISSUES: Normal marrow signal. No soft tissue abnormality. CERVICAL SPINE: Negative visible cervical spine. IMPRESSION: 1. No acute intracranial abnormality. 2. Moderate for age signal changes compatible with chronic small vessel disease are stable from 2023 MRI. Electronically signed by: Helayne Hurst MD 09/18/2024 10:47 AM EST RP Workstation: [id number]   CT HEAD CODE STROKE WO CONTRAST Result Date: 09/18/2024 EXAM: CT HEAD WITHOUT CONTRAST 09/18/2024 10:00:13 AM TECHNIQUE: CT of the head was performed without the administration of intravenous contrast. Automated exposure control, iterative reconstruction, and/or weight based adjustment of the mA/kV was utilized to reduce the radiation dose to as low as reasonably achievable. COMPARISON: Brain MRI 07/21/2022 and CT head 07/20/2022. CLINICAL HISTORY: 74 year old female. Acute neurological deficit, stroke suspected. FINDINGS: BRAIN AND VENTRICLES: No acute hemorrhage. No evidence of acute infarct. No hydrocephalus. No extra-axial collection. No mass effect or midline shift. Stable brain volume, normal for age. White differentiation stable and within normal limits for age. No suspicious intracranial vascular hyperdensity. Rounded calcification along the left tentorium is unchanged, could be a dural calcification or small stable calcified meningioma (coronal image 50). No regional mass effect or edema. ORBITS: No gaze deviation. SINUSES: Paranasal sinuses, tympanic cavities and mastoids are well aerated. SOFT TISSUES AND SKULL: No acute soft tissue abnormality. No skull fracture. Hyperostosis of the calvarium. Calcified atherosclerosis  at the skull base. ALBERTA STROKE PROGRAM EARLY CT SCORE (ASPECTS): ganglionic (caudate, ic, lentiform nucleus, insula, M1-m3): 7 Supraganglionic (m4-m6): 3 Total: 10 IMPRESSION: 1. No acute intracranial abnormality.  ASPECTS 10. 2. These results were communicated to Dr. Germaine at 10:05 on 09/18/2024 by text page via the [facility] messaging system. Electronically signed by: Helayne Hurst MD 09/18/2024 10:06 AM EST RP Workstation: [id number]   Scheduled Meds:  sodium chloride    Intravenous Once   amitriptyline   75 mg Oral QHS   atorvastatin   80 mg Oral QPM   Chlorhexidine  Gluconate Cloth  6 each Topical Daily   cyanocobalamin   5,000 mcg Oral Daily   famotidine   40 mg Oral QHS   insulin  aspart  0-20 Units Subcutaneous TID WC   mupirocin  ointment  1 Application Nasal BID   nystatin  cream  1 Application Topical BID   sodium chloride  flush  10-40 mL Intracatheter Q12H   Continuous Infusions:   LOS: 3 days   Alejandro Marker, DO [facility] Available via Epic secure chat 7am-7pm After these hours, please refer to coverage provider listed on [email or web address] 09/18/2024, 5:34 PM  "

## 2024-09-18 NOTE — Significant Event (Signed)
 Rapid Response Event Note   Reason for Call :  Concern for left facial droop and left weakness  Initial Focused Assessment:  Patient laying in bed. Responds to voice. Slight facial droop on left side and some weakness with left grip. Intermittent slurring of speech. Per bedside RN patient speech was clear this morning when blood sugar was taken, but on reassessment she noted more prominent droop, speech change and weakness. Unsure of last known well, but son reported some concern of droop yesterday around 5PM.  CBG 115.  Follows commands. Pupils 4mm equal, round & brisk bilaterally. Will not smile on command, but tongue midline. No motor drift on initial exam. Sheikh MD at bedside, Code stroke initiated.   CODE STROKE activated at 0925. Transported to CT with bedside RN and tele neuro cart. Transported to MRI after and back to ICU 1227 without complications    Interventions:  CODE STROKE protocol  MRI of Head    Event Summary:   MD NotifiedBETHA Marker MD Call Time: [phone] Arrival Time:  0921 End Time: 1045  Barnie Clover, RN

## 2024-09-18 NOTE — Progress Notes (Signed)
 OT Cancellation Note  Patient Details Name: Ramonda Galyon MRN: [id number] DOB: 1951/05/22   Cancelled Treatment:    Reason Eval/Treat Not Completed: Patient at procedure or test/ unavailable pt in CT imaging due to code stroke called.  Donny BECKER OT Acute Rehabilitation Services Office [phone]    [place] Dorothyann Distel 09/18/2024, 9:53 AM

## 2024-09-18 NOTE — Consult Note (Signed)
 [facility] Telemedicine Consult   Requesting Provider: Sherrill Consult Participants: Nurse Location of the provider: [facility] Location of the patient: [facility]  This consult was provided via telemedicine with 2-way video and audio communication. The patient/family was informed that care would be provided in this way and agreed to receive care in this manner.    Chief Complaint: Left facial droop, slurred speech and decreased left hand grip  HPI: 74 y.o. 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  Has required multiple units of PRBCs.  Hgb dropping.  Was noted this morning to have facial droop, slurred speech and decreased left hand grip. From review of chart patient with facial droop after GI procedure on yesterday.  Was per the note there prior to procedure as well.    LKW: Unknown tnk given?: No, Active GIB requiring multiple transfusions, unclear LKW IR Thrombectomy? No, Exam not consistent with LVO, unclear LKW Modified Rankin Scale: 0-Completely asymptomatic and back to baseline post- stroke Time of teleneurologist evaluation: 0929  Exam: Vitals:   09/18/24 0700 09/18/24 0800  BP: (!) 132/59 (!) 110/47  Pulse: 82 68  Resp: 16 15  Temp:  98 F (36.7 C)  SpO2: 93% 93%    General: NAD-reports mouth is dry  1A: Level of Consciousness - 1 1B: Ask Month and Age - 0 1C: 'Blink Eyes' & 'Squeeze Hands' - 0 2: Test Horizontal Extraocular Movements - 0 3: Test Visual Fields - 0 4: Test Facial Palsy - 1 5A: Test Left Arm Motor Drift - 0 5B: Test Right Arm Motor Drift - 0 6A: Test Left Leg  Motor Drift - 3 6B: Test Right Leg Motor Drift - 3 7: Test Limb Ataxia - 0 8: Test Sensation - 0 9: Test Language/Aphasia- 0 10: Test Dysarthria - 1 11: Test Extinction/Inattention - 0 NIHSS score: 9   Imaging Reviewed:  CT HEAD WITHOUT CONTRAST 09/18/2024 10:00:13 AM   TECHNIQUE: CT of the head was performed without the administration of intravenous contrast. Automated exposure control, iterative reconstruction, and/or weight based adjustment of the mA/kV was utilized to reduce the radiation dose to as low as reasonably achievable.   COMPARISON: Brain MRI 07/21/2022 and CT head 07/20/2022.   CLINICAL HISTORY: 74 year old female. Acute neurological deficit, stroke suspected.   FINDINGS:   BRAIN AND VENTRICLES: No acute hemorrhage. No evidence of acute infarct. No hydrocephalus. No extra-axial collection. No mass effect or midline shift. Stable brain volume, normal for age. White differentiation stable and within normal limits for age. No suspicious intracranial vascular hyperdensity.   Rounded calcification along the left tentorium is unchanged, could be a dural calcification or small stable calcified meningioma (coronal image 50). No regional mass effect or edema.   ORBITS: No gaze deviation.   SINUSES: Paranasal sinuses, tympanic cavities and mastoids are well aerated.   SOFT TISSUES AND SKULL: No acute soft tissue abnormality. No skull fracture. Hyperostosis of the calvarium. Calcified atherosclerosis at the skull base.   ALBERTA STROKE PROGRAM EARLY CT SCORE (ASPECTS): ganglionic (caudate, ic, lentiform nucleus, insula, M1-m3): 7 Supraganglionic (m4-m6): 3 Total: 10   IMPRESSION: 1. No acute intracranial abnormality.  ASPECTS 10. 2. These results were communicated to Dr. Germaine at 10:05 on  09/18/2024 by  Labs reviewed in epic and pertinent values follow: Glucose 124   Assessment: 74 y.o. 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  Has required multiple units of PRBCs.  Hgb dropping.  Was noted this morning to have facial droop, slurred speech and decreased left hand grip. Unclear LKW.  NIHSS of 9 but majority of exam nonfocal other than facial droop.  Patient not a thrombolytic or thrombectomy candidate due to GIB and unknown LKW with exam not consistent with LVO.  Head CT personally reviewed and shows no acute changes.  Recommendations:  Due to GIB would not start antiplatelet therapy unless clear evidence of acute infarct. Patient going for MRI of the brain at this time.   Telemetry Neuro checks  Case discussed with Dr. Sherrill  This patient is receiving care for possible acute neurological changes. Care by this provider at the time of service included time for direct evaluation via telemedicine, review of medical records, imaging studies and discussion of findings with providers, the patient and/or family.  Sonny Hock, MD Neurology   If 8pm- 8am, please page neurology on call as listed in AMION.

## 2024-09-19 ENCOUNTER — Encounter (HOSPITAL_COMMUNITY): Payer: Self-pay | Admitting: Internal Medicine

## 2024-09-19 DIAGNOSIS — I10 Essential (primary) hypertension: Secondary | ICD-10-CM | POA: Diagnosis not present

## 2024-09-19 DIAGNOSIS — K922 Gastrointestinal hemorrhage, unspecified: Secondary | ICD-10-CM | POA: Diagnosis not present

## 2024-09-19 DIAGNOSIS — E538 Deficiency of other specified B group vitamins: Secondary | ICD-10-CM | POA: Diagnosis not present

## 2024-09-19 DIAGNOSIS — Z86718 Personal history of other venous thrombosis and embolism: Secondary | ICD-10-CM | POA: Diagnosis not present

## 2024-09-19 DIAGNOSIS — D62 Acute posthemorrhagic anemia: Secondary | ICD-10-CM | POA: Diagnosis not present

## 2024-09-19 DIAGNOSIS — E785 Hyperlipidemia, unspecified: Secondary | ICD-10-CM | POA: Diagnosis not present

## 2024-09-19 DIAGNOSIS — I2510 Atherosclerotic heart disease of native coronary artery without angina pectoris: Secondary | ICD-10-CM | POA: Diagnosis not present

## 2024-09-19 DIAGNOSIS — J4542 Moderate persistent asthma with status asthmaticus: Secondary | ICD-10-CM | POA: Diagnosis not present

## 2024-09-19 DIAGNOSIS — I700 Atherosclerosis of aorta: Secondary | ICD-10-CM | POA: Diagnosis not present

## 2024-09-19 DIAGNOSIS — J42 Unspecified chronic bronchitis: Secondary | ICD-10-CM | POA: Diagnosis not present

## 2024-09-19 DIAGNOSIS — Z6841 Body Mass Index (BMI) 40.0 and over, adult: Secondary | ICD-10-CM | POA: Diagnosis not present

## 2024-09-19 LAB — CBC WITH DIFFERENTIAL/PLATELET
Abs Immature Granulocytes: 0.04 10*3/uL (ref 0.00–0.07)
Abs Immature Granulocytes: 0.04 10*3/uL (ref 0.00–0.07)
Basophils Absolute: 0 10*3/uL (ref 0.0–0.1)
Basophils Absolute: 0 10*3/uL (ref 0.0–0.1)
Basophils Relative: 1 %
Basophils Relative: 0 %
Eosinophils Absolute: 0.4 10*3/uL (ref 0.0–0.5)
Eosinophils Absolute: 0.4 10*3/uL (ref 0.0–0.5)
Eosinophils Relative: 6 %
Eosinophils Relative: 5 %
HCT: 35.2 % — ABNORMAL LOW (ref 36.0–46.0)
HCT: 37.1 % (ref 36.0–46.0)
Hemoglobin: 11.6 g/dL — ABNORMAL LOW (ref 12.0–15.0)
Hemoglobin: 12.4 g/dL (ref 12.0–15.0)
Immature Granulocytes: 1 %
Immature Granulocytes: 1 %
Lymphocytes Relative: 23 %
Lymphocytes Relative: 20 %
Lymphs Abs: 1.3 10*3/uL (ref 0.7–4.0)
Lymphs Abs: 1.5 10*3/uL (ref 0.7–4.0)
MCH: 28.1 pg (ref 26.0–34.0)
MCH: 28.1 pg (ref 26.0–34.0)
MCHC: 33 g/dL (ref 30.0–36.0)
MCHC: 33.4 g/dL (ref 30.0–36.0)
MCV: 85.2 fL (ref 80.0–100.0)
MCV: 84.1 fL (ref 80.0–100.0)
Monocytes Absolute: 0.5 10*3/uL (ref 0.1–1.0)
Monocytes Absolute: 0.8 10*3/uL (ref 0.1–1.0)
Monocytes Relative: 8 %
Monocytes Relative: 10 %
Neutro Abs: 3.6 10*3/uL (ref 1.7–7.7)
Neutro Abs: 4.8 10*3/uL (ref 1.7–7.7)
Neutrophils Relative %: 61 %
Neutrophils Relative %: 64 %
Platelets: 144 10*3/uL — ABNORMAL LOW (ref 150–400)
Platelets: 140 10*3/uL — ABNORMAL LOW (ref 150–400)
RBC: 4.13 MIL/uL (ref 3.87–5.11)
RBC: 4.41 MIL/uL (ref 3.87–5.11)
RDW: 16.1 % — ABNORMAL HIGH (ref 11.5–15.5)
RDW: 16 % — ABNORMAL HIGH (ref 11.5–15.5)
WBC: 5.8 10*3/uL (ref 4.0–10.5)
WBC: 7.5 10*3/uL (ref 4.0–10.5)
nRBC: 0 % (ref 0.0–0.2)
nRBC: 0 % (ref 0.0–0.2)

## 2024-09-19 LAB — PHOSPHORUS: Phosphorus: 3 mg/dL (ref 2.5–4.6)

## 2024-09-19 LAB — LIPID PANEL
Cholesterol: 81 mg/dL (ref 0–200)
HDL: 30 mg/dL — ABNORMAL LOW
LDL Cholesterol: 36 mg/dL (ref 0–99)
Total CHOL/HDL Ratio: 2.7 ratio
Triglycerides: 73 mg/dL
VLDL: 15 mg/dL (ref 0–40)

## 2024-09-19 LAB — COMPREHENSIVE METABOLIC PANEL WITH GFR
ALT: 10 U/L (ref 0–44)
AST: 23 U/L (ref 15–41)
Albumin: 3.3 g/dL — ABNORMAL LOW (ref 3.5–5.0)
Alkaline Phosphatase: 71 U/L (ref 38–126)
Anion gap: 11 (ref 5–15)
BUN: 8 mg/dL (ref 8–23)
CO2: 20 mmol/L — ABNORMAL LOW (ref 22–32)
Calcium: 9.3 mg/dL (ref 8.9–10.3)
Chloride: 109 mmol/L (ref 98–111)
Creatinine, Ser: 0.91 mg/dL (ref 0.44–1.00)
GFR, Estimated: >60 mL/min
Glucose, Bld: 90 mg/dL (ref 70–99)
Potassium: 4.2 mmol/L (ref 3.5–5.1)
Sodium: 140 mmol/L (ref 135–145)
Total Bilirubin: 1 mg/dL (ref 0.0–1.2)
Total Protein: 5.3 g/dL — ABNORMAL LOW (ref 6.5–8.1)

## 2024-09-19 LAB — GLUCOSE, CAPILLARY
Glucose-Capillary: 108 mg/dL — ABNORMAL HIGH (ref 70–99)
Glucose-Capillary: 183 mg/dL — ABNORMAL HIGH (ref 70–99)
Glucose-Capillary: 66 mg/dL — ABNORMAL LOW (ref 70–99)

## 2024-09-19 LAB — MAGNESIUM: Magnesium: 2 mg/dL (ref 1.7–2.4)

## 2024-09-19 LAB — HEMOGLOBIN A1C
Hgb A1c MFr Bld: 6 % — ABNORMAL HIGH (ref 4.8–5.6)
Mean Plasma Glucose: 125.5 mg/dL

## 2024-09-19 MED ORDER — CHLORHEXIDINE GLUCONATE CLOTH 2 % EX PADS
6.0000 | MEDICATED_PAD | Freq: Every day | CUTANEOUS | Status: DC
  Administered 2024-09-21 – 2024-09-26 (×3): 6 via TOPICAL

## 2024-09-19 MED ORDER — BOOST / RESOURCE BREEZE PO LIQD CUSTOM
1.0000 | Freq: Three times a day (TID) | ORAL | Status: DC
  Administered 2024-09-19 – 2024-09-23 (×12): 1 via ORAL
  Administered 2024-09-24: 237 mL via ORAL
  Administered 2024-09-24 – 2024-09-27 (×8): 1 via ORAL

## 2024-09-19 NOTE — Progress Notes (Signed)
 " PROGRESS NOTE    Mary Ferguson  FMW:[id number] DOB: Jan 18, 1951 DOA: 09/15/2024 PCP: Catherine Charlies LABOR, DO   Brief Narrative:  The Patient Mary Ferguson is a 74 y.o. 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   GI was consulted and took her for a Flex Sig and she had old blood and diverticulosis but no active signs of bleeding. After her Flex Sig she had a large BRBPR and became hypotensive.  She was stabilized after blood transfusion boluses and stat CTA showed no evidence of acute bleeding so IR is not intervening.  She underwent a GI prep for a colonoscopy today and prior to her colonoscopy she had another pretty significant bloody bowel movement blood pressure dropped again and hemoglobin started dropping.  She was given another bolus and given 3 units of PRBCs.  Colonoscopy showed that she had some diverticuli and there was evidence of recent bleeding in the descending colon so the GI physician placed 2 clips and recommended NPO.  The GI team then recommended making her n.p.o. and given this and her tenuous blood sugars we will place her on D5 half-normal saline at 75 mL/h.  On the morning of 09/18/2024  GI team is recommending advancing her diet to clears without subsequently she had strokelike symptoms so a code stroke was called.  She was worked up for this and negative for acute CVA.  Her diet was then readvanced to clears after SLP evaluation.  She is doing well currently diet was advanced to soft  this morning but however she had another bloody bowel movement so she is cut back to clears.  Case was discussed with GI who recommended a CBC and following if she is having more bleeding then getting a stat CTA to see if IR can embolize the source.  Assessment and Plan:  Acute Lower GI bleeding with associated Acute blood loss anemia (ABLA)/Symptomatic Anemia:  -CT Abd/Pelvis done and showed Crescentic hyperdensity over the rectosigmoid junction in the midline posterior pelvis measuring 7 x 14 mm possibly a focus of acute hemorrhage. Punctate hyperdensity within the rectum slightly more inferiorly. Mild diverticulosis throughout the colon without active inflammation. Mild fecal retention over the right colon with stool in fluid-filled rectosigmoid and the left colon. Unchanged renal cyst. Aortic atherosclerosis. Atherosclerotic coronary artery disease. Stable L1 fracture. -Underwent Flex Sig which showed that preparation the colon was poor and that there was blood in the rectum and sigmoid colon and the descending colon as well as the mid descending colon and distal descending colon but no active bleeding.  Diverticulosis was noted in the rectosigmoid colon as well as in the sigmoid colon and the descending colon. -After the Flex Sig she had a large BRBPR Bowel Movement and was symptomatic and hypotenstive. 1/23 prior to Colonoscopy she had another bloody bowel movement (a pretty significant BM again with about 500 mL bloody maroon colored matter with lots of clot per nursing); on 09/19/2024 she had a medium red bloody bowel movement with clots and was lightheaded and dizzy after -Hgb/Hct Trend:  Recent Labs  Lab 09/16/24 1642 09/17/24 0535 09/17/24 0757 09/17/24 1212 09/17/24  1442 09/18/24 0255 09/19/24 0758  HGB 5.0* 10.9* 9.8* 9.0* 7.8* 10.9* 11.6*  HCT 15.8* 32.6* 31.3* 27.5* 23.0* 32.4* 35.2*  MCV  --  84.9 87.7 85.7  --  84.6 85.2  -S/p multiple IVF boluses; S/p 6 units of pRBCs and 1u  of FFP -STAT 09/16/24 CTA GI Bleed showed no active bleed and no acute extravasation on CTA so there was no IR intervention required -CTM in SDU given her previous hypotension and continued bloody bowel movement; -PT-INR was 19.6-1.6 and is now 16.0-1.2 -C/w Supportive Care and C/w Antiemetics w/ po/IV q6hprn Nausea -IR notified and will evaluate the patient but CTA showed no active Bleed -Given Hypotension PCCM was consulted but BP improved with Transfusion so no pressors needed -Colonoscopy done after prep yesterday and showed that the preparation of the colon was inadequate and that there was diverticulosis in the sigmoid colon and the descending colon and the distal transverse colon.  In the descending colon there is evidence of recent bleeding for diverticular opening and clips were placed x 2. -The GI team recommended to keep the patient n.p.o. after the colonoscopy and this was done but now they are recommending CLD.  Clear liquid diet was advanced to full liquid but given her bloody bowel movement we will go back to clears. for now; discussed with the GI physician Dr. Dianna and they are recommending to obtain a stat CTA bleeding scan if she has signs of active bleeding and if she is symptomatic.  Continue to hold apixaban as we can and recommending discussing with the new GI physician Dr. Elicia next week about when it is safe to resume if she is stop bleeding  Neuro-Deficit/Stroke Like Symptoms: Had a worsened facial droop and weakness; Stroke Alert called and Stroke Workup initiated. CT Head done and showed No acute intracranial abnormality. ASPECTS 10. Ordered CTA Head and Neck but cancelled by Tele Neuro. MRI done and showed No acute intracranial abnormality and  Moderate for age signal changes compatible with chronic small vessel disease are stable from 2023 MRI. -ECHO done and showed EF of 60-65% w/ No RWMA and showed G1DD -PT/OT/SLP; SLP evaluated by PT OT still pending -Check Lipid  Panel and A1c in the AM   Essential Hypertension -> Hypotension: Continuing to Hold antihypertensives for now given her recurrent BRBPR. CTM BP per Protocol. Last BP reading was improved and now elevated and was 171/80  CAD S/P PCI: Continue Atorvastatin  80 mg po q Evening. Continue to Hold beta-blocker, long-acting nitrates and DOAC given above    Aortic Atherosclerosis / Dyslipidemia, goal LDL below 70: Continue Atorvastatin  80 mg p.o. daily.   Obstructive Sleep Apnea (adult) (pediatric): May use CPAP at bedtime.   Moderate persistent asthma / COPD (chronic obstructive pulmonary disease) w/ Chronic Respiratory Failure with Chronic 3 Liters of Supplemental O2 via [place] (HCC): -Change Albuterol  MDI to Albuterol  Neb 2.5 mg IH q4hprn Wheezing and SOB -CTM Respiratory Status    B12 Deficiency: Continue B12 supplementation with po Cyanocobalamin  5,000 mcg po Daily    History of Recurrent Deep Vein Thrombosis (DVT): Anticoagulation w/ Rivaroxaban  on hold due to GIB   Type II Diabetes Mellitus with Manifestations (HCC): On CLD but once stable will need to be on a [place] Healthy/ Carbohydrate modified diet. CBG monitoring with Resistant Novolog  SSI AC. Check hemoglobin A1c. CTM CBG's per Protocol. CBG Trend ranging from 76-183  PTSD: C/w Amitriptyline  75 mg po qHS  AKI on CKD Stage 3a / Metabolic Acidosis:  BUN/Cr Trend improved: Recent Labs  Lab 09/15/24 0816 09/15/24 0818 09/16/24 0330 09/17/24 0535 09/17/24 1442 09/18/24 0255 09/19/24 0758  BUN 31* 32* 27* 19 15 11 8   CREATININE 1.52* 1.60* 1.09* 0.99 1.10* 0.88 0.91  - Has a slight metabolic acidosis with a CO2 of 20, Anion Gap of 9, Chloride Level 109 -Avoid Nephrotoxic Medications, Contrast Dyes, Hypotension and Dehydration to Ensure Adequate Renal Perfusion and will need to Renally Adjust Meds. CTM & Trend Renal Fxn carefully & repeat CMP in the AM  Hypoalbuminemia: Patient's Albumin  Lvl went from 3.8 -> 3.1 -> 3.5 -> 3.2 -> 3.3. CTM  & Trend & repeat CMP in the AM   Class III (Morbid) Obesity: Complicates overall prognosis and care. Estimated body mass index is 45.91 kg/m as calculated from the following:   Height as of this encounter: 5' 2 (1.575 m).   Weight as of this encounter: 113.9 kg. Weight Loss and Dietary Counseling given   DVT prophylaxis: SCDs Start: 09/15/24 1131    Code Status: Full Code Family Communication: No family present @ bedside   Disposition Plan:  Level of care: Stepdown Status is: Inpatient Remains inpatient appropriate because: Continues to have GIB and symptoms and has been hypotensive because of it   Consultants:  Gastroenterology PCCM IR TeleNeuro  Procedures:  As delineated as above  Antimicrobials:  Anti-infectives (From admission, onward)    None       Subjective: Seen and examined at bedside and she is doing better today compared to yesterday however she had a bloody bowel movement this afternoon and became dizzy.  Continues to have chronic abdominal discomfort.  No nausea.  No other concerns or complaints at this time.  Objective: Vitals:   09/19/24 1300 09/19/24 1400 09/19/24 1500 09/19/24 1600  BP: (!) 165/67 (!) 151/90 137/73 (!) 171/80  Pulse: 78 98 95 93  Resp: 15 17 16 15   Temp:   97.7 F (36.5 C)   TempSrc:   Oral   SpO2: 94% 95% 93% 96%  Weight:      Height:       No intake or output data in the 24 hours ending 09/19/24 1700 Filed Weights   09/15/24 0806 09/16/24 1223 09/17/24 1327  Weight: 116 kg 116 kg 113.9 kg   Examination: Physical Exam:  Constitutional: WN/WD morbidly obese chronically ill-appearing Caucasian elderly female in no acute distress Respiratory: Diminished to auscultation bilaterally, no wheezing, rales, rhonchi or crackles. Normal respiratory effort and patient is not tachypenic. No accessory muscle use.  Unlabored breathing Cardiovascular: RRR, no murmurs / rubs / gallops. S1 and S2 auscultated.  Mild extremity  edema. Abdomen: Soft, tender to palpate, distended secondary to body habitus. Bowel sounds positive.  GU: Deferred. Musculoskeletal: No clubbing / cyanosis of digits/nails. No joint deformity upper and lower extremities. Skin: No rashes, lesions, ulcers on limited skin evaluation. No induration; Warm and dry.  Neurologic: CN 2-12 grossly intact with no focal deficits but does have a slight facial droop on the left. Romberg sign and cerebellar reflexes not assessed.  Psychiatric: Normal judgment and insight. Alert and oriented x 3. Normal mood and appropriate affect.   Data Reviewed: I have personally reviewed following labs and imaging studies  CBC: Recent Labs  Lab 09/17/24 0535 09/17/24 0757 09/17/24 1212 09/17/24 1442 09/18/24 0255 09/19/24 0758 09/19/24 1614  WBC 6.8 6.7 7.1  --  6.7 5.8 7.5  NEUTROABS 4.4  --  4.4  --  4.1 3.6 4.8  HGB 10.9* 9.8* 9.0* 7.8* 10.9* 11.6* 12.4  HCT 32.6* 31.3* 27.5* 23.0* 32.4* 35.2* 37.1  MCV 84.9 87.7 85.7  --  84.6 85.2 84.1  PLT 137* 160 170  --  141* 144* 140*   Basic Metabolic Panel: Recent Labs  Lab 09/15/24 0816 09/15/24 0818 09/16/24 0330 09/17/24 0535 09/17/24 1442 09/18/24 0255 09/19/24 0758  NA 141   < > 140 142 145 142 140  K 4.8   < > 4.3 4.6 4.2 3.7 4.2  CL 108   < > 111 112* 114* 113* 109  CO2 24  --  20* 19*  --  23 20*  GLUCOSE 241*   < > 139* 133* 96 124* 90  BUN 31*   < > 27* 19 15 11 8   CREATININE 1.52*   < > 1.09* 0.99 1.10* 0.88 0.91  CALCIUM  10.4*  --  9.2 9.2  --  8.7* 9.3  MG  --   --   --  2.5*  --  1.9 2.0  PHOS  --   --   --  2.7  --  3.1 3.0   < > = values in this interval not displayed.   GFR: Estimated Creatinine Clearance: 66.7 mL/min (by C-G formula based on SCr of 0.91 mg/dL). Liver Function Tests: Recent Labs  Lab 09/15/24 0816 09/16/24 0330 09/17/24 0535 09/18/24 0255 09/19/24 0758  AST 19 16 22 16 23   ALT 14 11 12 9 10   ALKPHOS 101 75 72 60 71  BILITOT 0.6 0.7 0.9 0.9 1.0  PROT 6.7  5.3* 5.8* 4.9* 5.3*  ALBUMIN  3.8 3.1* 3.5 3.2* 3.3*   No results for input(s): LIPASE, AMYLASE in the last 168 hours. No results for input(s): AMMONIA in the last 168 hours. Coagulation Profile: Recent Labs  Lab 09/15/24 0816 09/16/24 0330 09/17/24 0757  INR 2.1* 1.6* 1.2   Cardiac Enzymes: No results for input(s): CKTOTAL, CKMB, CKMBINDEX, TROPONINI in the last 168 hours. BNP (last 3 results) No results for input(s): PROBNP in the last 8760 hours. HbA1C: No results for input(s): HGBA1C in the last 72 hours. CBG: Recent Labs  Lab 09/17/24 1553 09/18/24 1129 09/18/24 1541 09/18/24 2129 09/19/24 1531  GLUCAP 76 120* 91 85 183*   Lipid Profile: Recent Labs    09/19/24 0758  CHOL 81  HDL 30*  LDLCALC 36  TRIG 73  CHOLHDL 2.7   Thyroid  Function Tests: No results for input(s): TSH, T4TOTAL, FREET4, T3FREE, THYROIDAB in the last 72 hours. Anemia Panel: No results for input(s): VITAMINB12, FOLATE, FERRITIN, TIBC, IRON, RETICCTPCT in the last 72 hours. Sepsis Labs: No results for input(s): PROCALCITON, LATICACIDVEN in the last 168 hours.  Recent Results (from the past 240 hours)  Surgical PCR screen     Status: Abnormal   Collection Time: 09/15/24  5:55 PM   Specimen: Nasal Mucosa; Nasal Swab  Result Value Ref Range Status   MRSA, PCR NEGATIVE NEGATIVE Final   Staphylococcus aureus POSITIVE (A) NEGATIVE Final    Comment: (NOTE) The Xpert SA Assay (FDA approved for NASAL specimens in patients 82 years of age and older), is one component of a comprehensive surveillance program. It is not intended to diagnose infection nor to guide or monitor treatment. Performed at [facility], [address], [place], [place] [address]     Radiology Studies: ECHOCARDIOGRAM COMPLETE Result Date: 09/18/2024    ECHOCARDIOGRAM REPORT   Patient Name:   AUDIA AMICK Date of Exam: 09/18/2024 Medical  Rec #:  [id number]    Height:       62.0 in Accession #:    [id number]  Weight:       251.0 lb Date of Birth:  1951-01-24   BSA:          2.105 m Patient Age:    73 years    BP:           163/150 mmHg Patient Gender: F           HR:           70 bpm. Exam Location:  Inpatient Procedure: 2D Echo, Color Doppler and Cardiac Doppler (Both Spectral and Color            Flow Doppler were utilized during procedure). Indications:    Stroke I63.9  History:        Patient has prior history of Echocardiogram examinations, most                 recent 06/18/2019. Previous Myocardial Infarction; Risk                 Factors:Hypertension.  Sonographer:    Sydnee Wilson RDCS Referring Phys: [id number] ALEJANDRO LATIF [facility]  Sonographer Comments: Image acquisition challenging due to patient body habitus. IMPRESSIONS  1. Left ventricular ejection fraction, by estimation, is 60 to 65%. The left ventricle has normal function. The left ventricle has no regional wall motion abnormalities. Left ventricular diastolic parameters are consistent with Grade I diastolic dysfunction (impaired relaxation).  2. Right ventricular systolic function is normal. The right ventricular size is normal.  3. Left atrial size was mildly dilated.  4. The mitral valve is normal in structure. No evidence of mitral valve regurgitation. No evidence of mitral stenosis.  5. The aortic valve is tricuspid. There is mild calcification of the aortic valve. Aortic valve regurgitation is not visualized. Aortic valve sclerosis/calcification is present, without any evidence of aortic stenosis.  6. The inferior vena cava is normal in size with greater than 50% respiratory variability, suggesting right atrial pressure of 3 mmHg. Comparison(s): No significant change from prior study. Prior images reviewed side by side. FINDINGS  Left Ventricle: Left ventricular ejection fraction, by estimation, is 60 to 65%. The left ventricle has normal function. The left ventricle has no regional wall motion  abnormalities. The left ventricular internal cavity size was normal in size. There is  no left ventricular hypertrophy. Left ventricular diastolic parameters are consistent with Grade I diastolic dysfunction (impaired relaxation). Right Ventricle: The right ventricular size is normal. No increase in right ventricular wall thickness. Right ventricular systolic function is normal. Left Atrium: Left atrial size was mildly dilated. Right Atrium: Right atrial size was normal in size. Pericardium: There is no evidence of pericardial effusion. Mitral Valve: The mitral valve is normal in structure. Mild mitral annular calcification. No evidence of mitral valve regurgitation. No evidence of mitral valve stenosis. Tricuspid Valve: The tricuspid valve is normal in structure. Tricuspid valve regurgitation is not demonstrated. No evidence of tricuspid stenosis. Aortic Valve: The aortic valve is tricuspid. There is mild calcification of the aortic valve. Aortic valve regurgitation is not visualized. Aortic valve sclerosis/calcification is present, without any evidence of aortic stenosis. Aortic valve mean gradient measures 5.0 mmHg. Aortic valve peak gradient measures 9.2 mmHg. Aortic valve area, by VTI measures 2.33 cm. Pulmonic Valve: The pulmonic valve was not well visualized. Pulmonic valve regurgitation is not visualized. No evidence of pulmonic stenosis. Aorta: The aortic root  and ascending aorta are structurally normal, with no evidence of dilitation. Venous: The inferior vena cava is normal in size with greater than 50% respiratory variability, suggesting right atrial pressure of 3 mmHg. IAS/Shunts: No atrial level shunt detected by color flow Doppler.  LEFT VENTRICLE PLAX 2D LVIDd:         3.20 cm      Diastology LVIDs:         2.30 cm      LV e' medial:    13.50 cm/s LV PW:         1.20 cm      LV E/e' medial:  4.5 LV IVS:        0.90 cm      LV e' lateral:   7.83 cm/s LVOT diam:     2.00 cm      LV E/e' lateral: 7.8  LV SV:         77 LV SV Index:   36 LVOT Area:     3.14 cm  LV Volumes (MOD) LV vol d, MOD A2C: 84.8 ml LV vol d, MOD A4C: 114.0 ml LV vol s, MOD A2C: 35.1 ml LV vol s, MOD A4C: 42.6 ml LV SV MOD A2C:     49.7 ml LV SV MOD A4C:     114.0 ml LV SV MOD BP:      62.8 ml RIGHT VENTRICLE RV S prime:     15.40 cm/s TAPSE (M-mode): 2.0 cm LEFT ATRIUM             Index        RIGHT ATRIUM           Index LA diam:        4.00 cm 1.90 cm/m   RA Area:     14.00 cm LA Vol (A2C):   44.9 ml 21.33 ml/m  RA Volume:   33.80 ml  16.05 ml/m LA Vol (A4C):   44.6 ml 21.18 ml/m LA Biplane Vol: 47.2 ml 22.42 ml/m  AORTIC VALVE AV Area (Vmax):    2.19 cm AV Area (Vmean):   1.95 cm AV Area (VTI):     2.33 cm AV Vmax:           152.00 cm/s AV Vmean:          104.000 cm/s AV VTI:            0.329 m AV Peak Grad:      9.2 mmHg AV Mean Grad:      5.0 mmHg LVOT Vmax:         106.00 cm/s LVOT Vmean:        64.500 cm/s LVOT VTI:          0.244 m LVOT/AV VTI ratio: 0.74  AORTA Ao Root diam: 3.40 cm Ao Asc diam:  2.80 cm MITRAL VALVE MV Area (PHT): 2.86 cm    SHUNTS MV E velocity: 60.80 cm/s  Systemic VTI:  0.24 m MV A velocity: 92.50 cm/s  Systemic Diam: 2.00 cm MV E/A ratio:  0.66 Mihai Croitoru MD Electronically signed by Jerel Balding MD Signature Date/Time: 09/18/2024/1:54:07 PM    Final    MR BRAIN WO CONTRAST Result Date: 09/18/2024 EXAM: MRI BRAIN WITHOUT CONTRAST 09/18/2024 10:41:00 AM TECHNIQUE: Multiplanar multisequence MRI of the head/brain was performed without the administration of intravenous contrast. Axial T1 weighted imaging is degraded by motion despite repeated imaging attempts. COMPARISON: CT head 09/18/2024, brain MRI 07/21/2022. CLINICAL HISTORY: 74 year old female  with acute neurological deficit, suspected stroke, and code stroke presentation. FINDINGS: BRAIN AND VENTRICLES: On diffusion weighted imaging, there is mild susceptibility artifact associated with bulky falcine and left tentorial calcifications. No  acute infarct. No intracranial hemorrhage. No mass. No midline shift. No hydrocephalus. The sella is unremarkable. Normal flow voids. Some generalized intracranial artery tortuosity. Susceptibility associated with the rounded calcification pattern near the left tentorium is again noted. Scattered small periventricular and subcortical white matter T2 and FLAIR hyperintensity in both hemispheres has not significantly changed, mild to moderate for age. Similar T2 heterogeneity in the deep gray matter nuclei and pons also stable. No cervical encephalomalacia or chronic cerebral blood products identified. ORBITS: No significant abnormality. SINUSES AND MASTOIDS: Mild left mastoid effusion has not significantly changed. BONES AND SOFT TISSUES: Normal marrow signal. No soft tissue abnormality. CERVICAL SPINE: Negative visible cervical spine. IMPRESSION: 1. No acute intracranial abnormality. 2. Moderate for age signal changes compatible with chronic small vessel disease are stable from 2023 MRI. Electronically signed by: Helayne Hurst MD 09/18/2024 10:47 AM EST RP Workstation: [id number]   CT HEAD CODE STROKE WO CONTRAST Result Date: 09/18/2024 EXAM: CT HEAD WITHOUT CONTRAST 09/18/2024 10:00:13 AM TECHNIQUE: CT of the head was performed without the administration of intravenous contrast. Automated exposure control, iterative reconstruction, and/or weight based adjustment of the mA/kV was utilized to reduce the radiation dose to as low as reasonably achievable. COMPARISON: Brain MRI 07/21/2022 and CT head 07/20/2022. CLINICAL HISTORY: 74 year old female. Acute neurological deficit, stroke suspected. FINDINGS: BRAIN AND VENTRICLES: No acute hemorrhage. No evidence of acute infarct. No hydrocephalus. No extra-axial collection. No mass effect or midline shift. Stable brain volume, normal for age. White differentiation stable and within normal limits for age. No suspicious intracranial vascular hyperdensity. Rounded  calcification along the left tentorium is unchanged, could be a dural calcification or small stable calcified meningioma (coronal image 50). No regional mass effect or edema. ORBITS: No gaze deviation. SINUSES: Paranasal sinuses, tympanic cavities and mastoids are well aerated. SOFT TISSUES AND SKULL: No acute soft tissue abnormality. No skull fracture. Hyperostosis of the calvarium. Calcified atherosclerosis at the skull base. ALBERTA STROKE PROGRAM EARLY CT SCORE (ASPECTS): ganglionic (caudate, ic, lentiform nucleus, insula, M1-m3): 7 Supraganglionic (m4-m6): 3 Total: 10 IMPRESSION: 1. No acute intracranial abnormality.  ASPECTS 10. 2. These results were communicated to Dr. Germaine at 10:05 on 09/18/2024 by text page via the [facility] messaging system. Electronically signed by: Helayne Hurst MD 09/18/2024 10:06 AM EST RP Workstation: [id number]   Scheduled Meds:  sodium chloride    Intravenous Once   amitriptyline   75 mg Oral QHS   atorvastatin   80 mg Oral QPM   Chlorhexidine  Gluconate Cloth  6 each Topical Daily   cyanocobalamin   5,000 mcg Oral Daily   famotidine   40 mg Oral QHS   feeding supplement  1 Container Oral TID BM   insulin  aspart  0-20 Units Subcutaneous TID WC   mupirocin  ointment  1 Application Nasal BID   nystatin  cream  1 Application Topical BID   sodium chloride  flush  10-40 mL Intracatheter Q12H   Continuous Infusions:   LOS: 4 days   Alejandro Marker, DO [facility] Available via Epic secure chat 7am-7pm After these hours, please refer to coverage provider listed on [email or web address] 09/19/2024, 5:00 PM  "

## 2024-09-19 NOTE — TOC Initial Note (Signed)
 Transition of Care [facility]) - Initial/Assessment Note    Patient Details  Name: Mary Ferguson MRN: [id number] Date of Birth: 1951-08-08  Transition of Care [facility]) CM/SW Contact:    Sonda Manuella Quill, RN Phone Number: 09/19/2024, 2:25 PM  Clinical Narrative:                 [place] w/ pt's son Lang Schneider [phone]); he said pt lives at home w/ his family; he plans for her to return at d/c w/ family support; he will provide transportation; insurance/PCP verified; Mr Schneider denied pt experiencing SDOH risks; pt has nebulizer,  cane, Rollator, wheelchair, BSC, and shower chair; she does not have HH services; pt has oxygen concentrator from [employer]; she does not have full travel tank; awaiting PT/OT evals; IP CM is following.  Expected Discharge Plan: Home/Self Care Barriers to Discharge: Continued Medical Work up   Patient Goals and CMS Choice Patient states their goals for this hospitalization and ongoing recovery are:: patient's son Lang Schneider said she will return home     Hazel ownership interest in [facility].provided to:: Adult Children    Expected Discharge Plan and Services   Discharge Planning Services: CM Consult   Living arrangements for the past 2 months: Single Family Home                 DME Arranged: N/A DME Agency: NA       HH Arranged: NA HH Agency: NA        Prior Living Arrangements/Services Living arrangements for the past 2 months: Single Family Home Lives with:: Adult Children Patient language and need for interpreter reviewed:: Yes Do you feel safe going back to the place where you live?: Yes      Need for Family Participation in Patient Care: Yes (Comment) Care giver support system in place?: Yes (comment) Current home services: DME (cane, Rollator, wheelchair, BSC, shower chair) Criminal Activity/Legal Involvement Pertinent to Current Situation/Hospitalization: No - Comment as needed  Activities of Daily Living   ADL  Screening (condition at time of admission) Independently performs ADLs?: Yes (appropriate for developmental age) Is the patient deaf or have difficulty hearing?: No Does the patient have difficulty seeing, even when wearing glasses/contacts?: No Does the patient have difficulty concentrating, remembering, or making decisions?: No  Permission Sought/Granted Permission sought to share information with : Case Manager Permission granted to share information with : Yes, Verbal Permission Granted  Share Information with NAME: Case Manager     Permission granted to share info w Relationship: Lang Schneider (son) [phone]     Emotional Assessment Appearance:: Other (Comment Required (unable to assess) Attitude/Demeanor/Rapport: Unable to Assess Affect (typically observed): Unable to Assess Orientation: :  (unable to assess) Alcohol / Substance Use: Not Applicable Psych Involvement: No (comment)  Admission diagnosis:  Rectal bleeding [K62.5] Acute lower GI bleeding [K92.2] Patient Active Problem List   Diagnosis Date Noted   Acute lower GI bleeding 09/15/2024   CAP (community acquired pneumonia) 09/15/2024   Acute blood loss anemia (ABLA) 09/15/2024   Chronic venous insufficiency 01/08/2022   Irritable bowel syndrome with diarrhea 06/21/2021   Imdur  therapy-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) 03/22/2021   Lumbar radiculopathy 03/22/2021   Neuropathy 03/22/2021   Aortic atherosclerosis 03/22/2021   Type II diabetes mellitus with manifestations (HCC) 03/21/2021   COPD (chronic obstructive pulmonary disease) (HCC) 03/21/2021   CAD S/P PCI 06/30/2019   Dyslipidemia, goal LDL below 70 06/30/2019   Morbid obesity (HCC)  Essential hypertension    History of non-ST elevation myocardial infarction (NSTEMI) 06/17/2019   Mild episode of recurrent major depressive disorder 03/25/2017   CKD (chronic kidney disease) stage 3, GFR 30-59 ml/min (HCC) 03/19/2017   Vitamin D   deficiency 03/19/2017   B12 deficiency 03/19/2017   Nocturnal oxygen desaturation 12/11/2016   BMI 45.0-49.9, adult (HCC) 12/11/2016   Ambulates with cane 12/11/2016   Chronic pruritic rash in adult 07/08/2016   Chronic pain syndrome 03/13/2015   Obstructive sleep apnea (adult) (pediatric) 08/05/2013   Xarelto -Acquired thrombophilia (HCC) 02/18/2013   Moderate persistent asthma 12/30/2012   History of recurrent deep vein thrombosis (DVT) 12/30/2012   PCP:  Catherine Charlies LABOR, DO Pharmacy:   [id number] - [place], [place] - [address] [place] [address] Phone: [phone] Fax: [phone]     Social Drivers of Health (SDOH) Social History: SDOH Screenings   Food Insecurity: No Food Insecurity (09/19/2024)  Housing: Low Risk (09/19/2024)  Transportation Needs: No Transportation Needs (09/19/2024)  Utilities: Not At Risk (09/19/2024)  Alcohol Screen: Low Risk (05/26/2023)  Depression (PHQ2-9): Low Risk (07/06/2024)  Financial Resource Strain: Low Risk (05/26/2023)  Physical Activity: Inactive (07/06/2024)  Social Connections: Socially Isolated (09/15/2024)  Stress: No Stress Concern Present (07/06/2024)  Tobacco Use: Medium Risk (09/17/2024)  Health Literacy: Adequate Health Literacy (07/06/2024)   SDOH Interventions: Food Insecurity Interventions: Intervention Not Indicated, Inpatient TOC Housing Interventions: Intervention Not Indicated, Inpatient TOC Transportation Interventions: Intervention Not Indicated, Inpatient TOC Utilities Interventions: Intervention Not Indicated, Inpatient TOC   Readmission Risk Interventions    09/19/2024    2:17 PM  Readmission Risk Prevention Plan  Transportation Screening Complete  PCP or Specialist Appt within 5-7 Days Complete  Home Care Screening Complete  Medication Review (RN CM) Complete

## 2024-09-20 DIAGNOSIS — G4733 Obstructive sleep apnea (adult) (pediatric): Secondary | ICD-10-CM | POA: Diagnosis not present

## 2024-09-20 DIAGNOSIS — D62 Acute posthemorrhagic anemia: Secondary | ICD-10-CM | POA: Diagnosis not present

## 2024-09-20 DIAGNOSIS — Z6841 Body Mass Index (BMI) 40.0 and over, adult: Secondary | ICD-10-CM | POA: Diagnosis not present

## 2024-09-20 DIAGNOSIS — Z86718 Personal history of other venous thrombosis and embolism: Secondary | ICD-10-CM | POA: Diagnosis not present

## 2024-09-20 DIAGNOSIS — E785 Hyperlipidemia, unspecified: Secondary | ICD-10-CM | POA: Diagnosis not present

## 2024-09-20 DIAGNOSIS — I700 Atherosclerosis of aorta: Secondary | ICD-10-CM | POA: Diagnosis not present

## 2024-09-20 DIAGNOSIS — I2510 Atherosclerotic heart disease of native coronary artery without angina pectoris: Secondary | ICD-10-CM | POA: Diagnosis not present

## 2024-09-20 DIAGNOSIS — J42 Unspecified chronic bronchitis: Secondary | ICD-10-CM | POA: Diagnosis not present

## 2024-09-20 DIAGNOSIS — J4542 Moderate persistent asthma with status asthmaticus: Secondary | ICD-10-CM | POA: Diagnosis not present

## 2024-09-20 DIAGNOSIS — K922 Gastrointestinal hemorrhage, unspecified: Secondary | ICD-10-CM | POA: Diagnosis not present

## 2024-09-20 DIAGNOSIS — I10 Essential (primary) hypertension: Secondary | ICD-10-CM | POA: Diagnosis not present

## 2024-09-20 DIAGNOSIS — E538 Deficiency of other specified B group vitamins: Secondary | ICD-10-CM | POA: Diagnosis not present

## 2024-09-20 LAB — BPAM RBC
Blood Product Expiration Date: 202602162359
Blood Product Expiration Date: 202602162359
Blood Product Expiration Date: 202602192359
Blood Product Expiration Date: 202602222359
Blood Product Expiration Date: 202602222359
Blood Product Expiration Date: 202602222359
ISSUE DATE / TIME: 202601221743
ISSUE DATE / TIME: 202601221951
ISSUE DATE / TIME: 202601222232
ISSUE DATE / TIME: 202601231456
ISSUE DATE / TIME: 202601231714
ISSUE DATE / TIME: 202601232122
Unit Type and Rh: 5100
Unit Type and Rh: 5100
Unit Type and Rh: 5100
Unit Type and Rh: 5100
Unit Type and Rh: 5100
Unit Type and Rh: 5100

## 2024-09-20 LAB — GLUCOSE, CAPILLARY
Glucose-Capillary: 91 mg/dL (ref 70–99)
Glucose-Capillary: 113 mg/dL — ABNORMAL HIGH (ref 70–99)
Glucose-Capillary: 115 mg/dL — ABNORMAL HIGH (ref 70–99)
Glucose-Capillary: 71 mg/dL (ref 70–99)
Glucose-Capillary: 123 mg/dL — ABNORMAL HIGH (ref 70–99)
Glucose-Capillary: 112 mg/dL — ABNORMAL HIGH (ref 70–99)
Glucose-Capillary: 132 mg/dL — ABNORMAL HIGH (ref 70–99)
Glucose-Capillary: 151 mg/dL — ABNORMAL HIGH (ref 70–99)
Glucose-Capillary: 153 mg/dL — ABNORMAL HIGH (ref 70–99)
Glucose-Capillary: 148 mg/dL — ABNORMAL HIGH (ref 70–99)

## 2024-09-20 LAB — CBC WITH DIFFERENTIAL/PLATELET
Abs Immature Granulocytes: 0.03 10*3/uL (ref 0.00–0.07)
Basophils Absolute: 0 10*3/uL (ref 0.0–0.1)
Basophils Relative: 1 %
Eosinophils Absolute: 0.4 10*3/uL (ref 0.0–0.5)
Eosinophils Relative: 6 %
HCT: 38.4 % (ref 36.0–46.0)
Hemoglobin: 12.3 g/dL (ref 12.0–15.0)
Immature Granulocytes: 1 %
Lymphocytes Relative: 26 %
Lymphs Abs: 1.6 10*3/uL (ref 0.7–4.0)
MCH: 27.7 pg (ref 26.0–34.0)
MCHC: 32 g/dL (ref 30.0–36.0)
MCV: 86.5 fL (ref 80.0–100.0)
Monocytes Absolute: 0.5 10*3/uL (ref 0.1–1.0)
Monocytes Relative: 9 %
Neutro Abs: 3.5 10*3/uL (ref 1.7–7.7)
Neutrophils Relative %: 57 %
Platelets: 183 10*3/uL (ref 150–400)
RBC: 4.44 MIL/uL (ref 3.87–5.11)
RDW: 16.2 % — ABNORMAL HIGH (ref 11.5–15.5)
WBC: 6 10*3/uL (ref 4.0–10.5)
nRBC: 0 % (ref 0.0–0.2)

## 2024-09-20 LAB — TYPE AND SCREEN
ABO/RH(D): O POS
Antibody Screen: NEGATIVE
Unit division: 0
Unit division: 0
Unit division: 0
Unit division: 0
Unit division: 0
Unit division: 0

## 2024-09-20 LAB — PREPARE FRESH FROZEN PLASMA

## 2024-09-20 LAB — COMPREHENSIVE METABOLIC PANEL WITH GFR
ALT: 10 U/L (ref 0–44)
AST: 20 U/L (ref 15–41)
Albumin: 3.4 g/dL — ABNORMAL LOW (ref 3.5–5.0)
Alkaline Phosphatase: 73 U/L (ref 38–126)
Anion gap: 11 (ref 5–15)
BUN: 9 mg/dL (ref 8–23)
CO2: 23 mmol/L (ref 22–32)
Calcium: 10.2 mg/dL (ref 8.9–10.3)
Chloride: 107 mmol/L (ref 98–111)
Creatinine, Ser: 0.91 mg/dL (ref 0.44–1.00)
GFR, Estimated: >60 mL/min
Glucose, Bld: 124 mg/dL — ABNORMAL HIGH (ref 70–99)
Potassium: 3.6 mmol/L (ref 3.5–5.1)
Sodium: 141 mmol/L (ref 135–145)
Total Bilirubin: 0.9 mg/dL (ref 0.0–1.2)
Total Protein: 5.9 g/dL — ABNORMAL LOW (ref 6.5–8.1)

## 2024-09-20 LAB — BPAM FFP
Blood Product Expiration Date: 202601240040
ISSUE DATE / TIME: 202601230117
Unit Type and Rh: 5100

## 2024-09-20 LAB — PHOSPHORUS: Phosphorus: 3.2 mg/dL (ref 2.5–4.6)

## 2024-09-20 LAB — MAGNESIUM: Magnesium: 2 mg/dL (ref 1.7–2.4)

## 2024-09-20 MED ORDER — INSULIN ASPART 100 UNIT/ML IJ SOLN
0.0000 [IU] | Freq: Three times a day (TID) | INTRAMUSCULAR | Status: DC
  Administered 2024-09-21 – 2024-09-22 (×3): 2 [IU] via SUBCUTANEOUS
  Administered 2024-09-22: 1 [IU] via SUBCUTANEOUS
  Administered 2024-09-23: 2 [IU] via SUBCUTANEOUS
  Administered 2024-09-23 (×2): 1 [IU] via SUBCUTANEOUS
  Administered 2024-09-24: 2 [IU] via SUBCUTANEOUS
  Administered 2024-09-25: 1 [IU] via SUBCUTANEOUS
  Administered 2024-09-25: 2 [IU] via SUBCUTANEOUS
  Administered 2024-09-26 – 2024-09-27 (×2): 1 [IU] via SUBCUTANEOUS
  Filled 2024-09-20: qty 2
  Filled 2024-09-20 (×2): qty 1
  Filled 2024-09-20 (×4): qty 2
  Filled 2024-09-20 (×2): qty 1
  Filled 2024-09-20: qty 2

## 2024-09-20 NOTE — Evaluation (Signed)
 Physical Therapy Evaluation Patient Details Name: Mary Ferguson MRN: [id number] DOB: [birth year] Today's Date: 09/20/2024  History of Present Illness  The Patient Mary Ferguson is a 74 y.o. 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  Pt currently with functional limitations due to the deficits listed below (see PT Problem List). Pt will benefit from acute skilled PT to increase their independence and safety with mobility to allow discharge.    The patient  eager to ambulate in room. Patient sitting on bed edge. Patient ambulated x 20' x 2  noted HR 97- 142 with activity. Spo2 > 94% on RA during mobility.Generally moves slowly and  demonstrates safe use with RW, no LOB. Did reports mildly lightheaded, unable to ge and accurate BP.  168/106 was recorded. Patient reports history of  Passing out and falling. Patient reports use of RW in BR, does not use device other times,  but relies on holding onto  objects, uses rollator to go out of home.  Patient's son and  daughter in law reside in home who also work so patient is home alone. Patient will benefit from continued inpatient follow up therapy, <3 hours/day and may decline, declined HHPT.           If plan is discharge home, recommend the following: A little help with walking and/or transfers;A little help with  bathing/dressing/bathroom;Help with stairs or ramp for entrance;Assistance with cooking/housework;Assist for transportation   Can travel by private vehicle   Yes    Equipment Recommendations None recommended by PT  Recommendations for Other Services       Functional Status Assessment Patient has had a recent decline in their functional status and demonstrates the ability to make significant improvements in function in a reasonable and predictable amount of time.     Precautions / Restrictions Precautions Precautions: Fall Precaution/Restrictions Comments: reports passes out  and falls Restrictions Weight Bearing Restrictions Per Provider Order: No      Mobility  Bed Mobility Overal bed mobility: Modified Independent             General bed mobility comments: sitting  partially on bed edge with rail up    Transfers Overall transfer level: Needs assistance Equipment used: Rolling walker (2 wheels) Transfers: Sit to/from Stand Sit to Stand: Min assist           General transfer comment: from bed and recliner and BSC with UE support    Ambulation/Gait Ambulation/Gait assistance: Min assist Gait Distance (Feet): 20 Feet (x 2) Assistive device: Rolling walker (2 wheels) Gait Pattern/deviations: Step-through pattern Gait velocity: decr     General Gait Details: gait slow and steady  Stairs            Wheelchair Mobility     Tilt Bed    Modified Rankin (Stroke Patients Only)       Balance Overall balance assessment: History of Falls, Needs assistance Sitting-balance support: Feet supported, No upper extremity supported Sitting balance-Leahy Scale: Good     Standing balance support:  During functional activity, No upper extremity supported Standing balance-Leahy Scale: Fair Standing balance comment: standing at sink                             Pertinent Vitals/Pain Pain Assessment Pain Assessment: Faces Faces Pain Scale: Hurts  a little bit Pain Location: back Pain Descriptors / Indicators: Aching, Discomfort, Grimacing Pain Intervention(s): Monitored during session    Home Living Family/patient expects to be discharged to:: Private residence Living Arrangements: Children Available Help at Discharge: Family Type of Home: House Home Access: Ramped entrance       Home Layout: One level Home Equipment: Tub bench;Wheelchair - Forensic Psychologist (2 wheels);Rollator (4 wheels)      Prior Function Prior Level of Function : Needs assist             Mobility Comments: rollator to go out, RW in BR, cruises in house w/ no device ADLs Comments: showers when someone is home, son and dtr in law work,     Extremity/Trunk Assessment        Lower Extremity Assessment Lower Extremity Assessment: Generalized weakness    Cervical / Trunk Assessment Cervical / Trunk Assessment: Normal  Communication   Communication Communication: No apparent difficulties    Cognition Arousal: Alert Behavior During Therapy: WFL for tasks assessed/performed   PT - Cognitive impairments: No apparent impairments                         Following commands: Intact       Cueing Cueing Techniques: Verbal cues     General Comments      Exercises     Assessment/Plan    PT Assessment Patient needs continued PT services  PT Problem List Decreased strength;Decreased mobility;Obesity;Cardiopulmonary status limiting activity       PT Treatment Interventions DME instruction;Gait training;Functional mobility training;Therapeutic activities;Therapeutic exercise;Patient/family education    PT Goals (Current goals can be found in the Care Plan section)  Acute Rehab PT Goals Patient Stated Goal: Return home PT Goal Formulation: With patient Time For Goal Achievement: 10/04/24 Potential to Achieve Goals: Good    Frequency Min 3X/week     Co-evaluation PT/OT/SLP Co-Evaluation/Treatment: Yes              AM-PAC PT 6 Clicks Mobility  Outcome Measure Help needed turning from your back to your side while in a flat bed without using bedrails?: None Help needed moving from lying on your back to sitting on the side of a flat bed without using bedrails?: None Help needed moving to and from a bed to a chair (including a wheelchair)?: A Little Help needed standing up from a chair using your arms (e.g., wheelchair or bedside chair)?: A Little Help needed to walk in hospital room?: A Little Help needed climbing 3-5 steps with a railing? : A Lot 6 Click Score: 19    End of Session Equipment Utilized During Treatment: Gait belt Activity Tolerance: Patient tolerated treatment well Patient left: in chair;with call bell/phone within reach;with chair alarm set Nurse Communication: Mobility status PT Visit Diagnosis: Unsteadiness on feet (R26.81);History of falling (Z91.81);Pain;Difficulty in walking, not elsewhere classified (R26.2)    Time: 8988-8956 PT Time Calculation (min) (ACUTE ONLY): 32 min   Charges:   PT Evaluation $PT Eval Low Complexity: 1 Low   PT General Charges $$ ACUTE PT VISIT: 1 Visit  Darice Potters PT Acute Rehabilitation Services Office [phone]   Potters Darice Norris 09/20/2024, 10:52 AM

## 2024-09-20 NOTE — Inpatient Diabetes Management (Signed)
 Inpatient Diabetes Program Recommendations  AACE/ADA: New Consensus Statement on Inpatient Glycemic Control (2015)  Target Ranges:  Prepandial:   less than 140 mg/dL      Peak postprandial:   less than 180 mg/dL (1-2 hours)      Critically ill patients:  140 - 180 mg/dL   Lab Results  Component Value Date   GLUCAP 132 (H) 09/20/2024   HGBA1C 6.0 (H) 09/19/2024    Review of Glycemic Control  Latest Reference Range & Units 09/19/24 15:31 09/19/24 19:37 09/19/24 20:58 09/19/24 23:47 09/20/24 07:57  Glucose-Capillary 70 - 99 mg/dL 816 (H)  Novolog  4 units  66 (L) 112 (H) 108 (H) 132 (H)  (H): Data is abnormally high (L): Data is abnormally low  Diabetes history: DM2 Outpatient Diabetes medications: Jardiance  10 mg QD Current orders for Inpatient glycemic control: Novolog  0-20 units TID  Inpatient Diabetes Program Recommendations:    Mild hypoglycemia of 66 mg/dL after Novolog  4 units correction.  Please consider decreasing correction:  Novlog 0-9 units TID   Thank you, Wyvonna Pinal, MSN, CDCES Diabetes Coordinator Inpatient Diabetes Program [phone] (team pager from 8a-5p)

## 2024-09-20 NOTE — Evaluation (Signed)
 Occupational Therapy Evaluation Patient Details Name: Mary Ferguson MRN: [id number] DOB: 07/20/1951 Today's Date: 09/20/2024   History of Present Illness   The is a 74 y.o. 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 She walks with a rollator outside, a RW in her bathroom and furniture walks throughout her home. Pt endorses many falls associated with dizziness. Pt presents with decreased attention and awareness of safety and deficits, generalized weakness and impaired standing balance. Pt was attempting to get up around bed rail upon arrival. She stood from heightened bed with min assist and ambulated to [facility] and sink with min assist. Pt requires set up to mod assist for ADLs. Patient will benefit from continued inpatient follow up therapy, <3 hours/day, son likely to refuse per pt. Pt is refusing HHOT.      If plan is discharge home, recommend the following:   A little help with walking and/or transfers;A lot of help with bathing/dressing/bathroom;Assistance with cooking/housework;Direct supervision/assist for medications management;Direct supervision/assist for financial management;Assist for transportation;Help with stairs or ramp for entrance     Functional Status Assessment   Patient has had a recent decline in their functional status and demonstrates the ability  to make significant improvements in function in a reasonable and predictable amount of time.     Equipment Recommendations   None recommended by OT     Recommendations for Other Services         Precautions/Restrictions   Precautions Precautions: Fall Recall of Precautions/Restrictions: Impaired Precaution/Restrictions Comments: reports passes out  and falls Restrictions Weight Bearing Restrictions Per Provider Order: No     Mobility Bed Mobility Overal bed mobility: Modified Independent             General bed mobility comments: sitting  partially on bed edge with rail up    Transfers Overall transfer level: Needs assistance Equipment used: Rolling walker (2 wheels) Transfers: Sit to/from Stand Sit to Stand: Min assist           General transfer comment: from bed and recliner and BSC with UE support      Balance Overall balance assessment: History of Falls, Needs assistance Sitting-balance support: Feet supported, No upper extremity supported Sitting balance-Leahy Scale: Good       Standing balance-Leahy Scale: Fair Standing balance comment: standing at sink                           ADL either performed or assessed with clinical judgement   ADL Overall ADL's : Needs assistance/impaired Eating/Feeding: Independent;Sitting   Grooming: Contact guard assist;Standing;Wash/dry hands   Upper Body Bathing: Supervision/ safety;Sitting   Lower Body Bathing: Sit to/from stand;Moderate assistance   Upper Body Dressing : Minimal assistance;Sitting   Lower Body Dressing: Sit to/from stand;Moderate assistance   Toilet Transfer: Minimal assistance;Ambulation;BSC/3in1;Rolling walker (2 wheels)   Toileting- Clothing Manipulation and Hygiene: Minimal assistance;Sit to/from stand       Functional mobility during ADLs: Minimal  assistance;Rolling walker (2 wheels)       Vision Ability to See in Adequate Light: 1 Impaired Patient Visual  Report: No change from baseline Additional Comments: reports floaters and poor depth perception     Perception         Praxis         Pertinent Vitals/Pain Pain Assessment Pain Assessment: Faces Faces Pain Scale: Hurts a little bit Pain Location: back Pain Descriptors / Indicators: Aching, Discomfort, Grimacing Pain Intervention(s): Monitored during session, Repositioned     Extremity/Trunk Assessment Upper Extremity Assessment Upper Extremity Assessment: Generalized weakness;Right hand dominant   Lower Extremity Assessment Lower Extremity Assessment: Defer to PT evaluation   Cervical / Trunk Assessment Cervical / Trunk Assessment: Other exceptions (obesity, weakness)   Communication Communication Communication: No apparent difficulties   Cognition Arousal: Alert Behavior During Therapy: WFL for tasks assessed/performed Cognition: No family/caregiver present to determine baseline, Cognition impaired     Awareness: Intellectual awareness impaired, Online awareness impaired Memory impairment (select all impairments): Short-term memory Attention impairment (select first level of impairment): Sustained attention Executive functioning impairment (select all impairments): Problem solving, Reasoning                   Following commands: Intact       Cueing  General Comments   Cueing Techniques: Verbal cues      Exercises     Shoulder Instructions      Home Living Family/patient expects to be discharged to:: Private residence Living Arrangements: Children (son and daughter in law) Available Help at Discharge: Family;Available PRN/intermittently Type of Home: House Home Access: Ramped entrance     Home Layout: One level     Bathroom Shower/Tub: Chief Strategy Officer: Handicapped height     Home Equipment: Tub bench;Wheelchair - Forensic Psychologist (2 wheels);Rollator (4 wheels)          Prior Functioning/Environment Prior  Level of Function : Needs assist             Mobility Comments: rollator to go out, RW in BR, cruises in house w/ no device ADLs Comments: showers when someone is home, son and dtr in law work, reports she can warm up leftovers and bathe and dress herself    OT Problem List: Decreased strength;Impaired balance (sitting and/or standing);Obesity;Decreased knowledge of precautions;Decreased cognition;Decreased safety awareness   OT Treatment/Interventions: Self-care/ADL training;DME and/or AE instruction;Therapeutic activities;Patient/family education;Balance training      OT Goals(Current goals can be found in the care plan section)   Acute Rehab OT Goals OT Goal Formulation: With patient Time For Goal Achievement: 10/04/24 Potential to Achieve Goals: Good ADL Goals Pt Will Perform Grooming: with supervision;standing Pt Will Perform Lower Body Bathing: with supervision;sit to/from stand Pt Will Perform Lower Body Dressing: with supervision;sit to/from stand Pt Will Perform Toileting - Clothing Manipulation and hygiene: with supervision;sit to/from stand Additional ADL Goal #1: Pt will state at least 3 fall prevention measures as instructed.   OT Frequency:  Min 2X/week    Co-evaluation              AM-PAC OT 6 Clicks Daily Activity     Outcome Measure Help from another person eating meals?: None Help from another person taking care of personal grooming?: A Little Help from another person toileting, which includes using toliet, bedpan, or urinal?: A Little Help from another person bathing (including washing, rinsing, drying)?: A Lot Help from another person to put on and taking off regular  upper body clothing?: A Little Help from another person to put on and taking off regular lower body clothing?: A Lot 6 Click Score: 17   End of Session Equipment Utilized During Treatment: Gait belt;Rolling walker (2 wheels) Nurse Communication: Mobility status  Activity  Tolerance: Patient tolerated treatment well Patient left: in chair;with call bell/phone within reach;with chair alarm set  OT Visit Diagnosis: Unsteadiness on feet (R26.81);Other abnormalities of gait and mobility (R26.89);Muscle weakness (generalized) (M62.81);Pain;Other symptoms and signs involving cognitive function                Time: 1011-1040 OT Time Calculation (min): 29 min Charges:  OT General Charges $OT Visit: 1 Visit OT Evaluation $OT Eval Moderate Complexity: 1 Mod  Mary Ferguson, OTR/L Acute Rehabilitation Services Office: [phone]   Kennth Mary Helling 09/20/2024, 1:35 PM

## 2024-09-20 NOTE — Progress Notes (Signed)
 " PROGRESS NOTE    Theresa Wedel  FMW:[id number] DOB: 1950/11/15 DOA: 09/15/2024 PCP: Catherine Charlies LABOR, DO   Brief Narrative:  The Patient Mary Ferguson is a 74 y.o. 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   GI was consulted and took her for a Flex Sig and she had old blood and diverticulosis but no active signs of bleeding. After her Flex Sig she had a large BRBPR and became hypotensive.  She was stabilized after blood transfusion boluses and stat CTA showed no evidence of acute bleeding so IR is not intervening.  She underwent a GI prep for a colonoscopy today and prior to her colonoscopy she had another pretty significant bloody bowel movement blood pressure dropped again and hemoglobin started dropping.  She was given another bolus and given 3 units of PRBCs.  Colonoscopy showed that she had some diverticuli and there was evidence of recent bleeding in the descending colon so the GI physician placed 2 clips and recommended NPO.  The GI team then recommended making her n.p.o. and given this and her tenuous blood sugars we will place her on D5 half-normal saline at 75 mL/h.  On the morning of 09/18/2024  GI team is recommending advancing her diet to clears without subsequently she had strokelike symptoms so a code stroke was called.  She was worked up for this and negative for acute CVA.  Her diet was then readvanced to clears after SLP evaluation.  She is doing well currently diet was advanced to soft  this morning but however she had another bloody bowel movement so she is cut back to clears.  Case was discussed with GI who recommended a CBC and following if she is having more bleeding then getting a stat CTA to see if IR can embolize the source.  09/20/2024: Still having some mild rectal bleeding with old blood clots.  Continue to hold Xarelto  for another 48 hours  Assessment and Plan:  Acute Lower GI bleeding with associated Acute blood loss anemia (ABLA)/Symptomatic Anemia:  -CT Abd/Pelvis done and showed Crescentic hyperdensity over the rectosigmoid junction in the midline posterior pelvis measuring 7 x 14 mm possibly a focus of acute hemorrhage. Punctate hyperdensity within the rectum slightly more inferiorly. Mild diverticulosis throughout the colon without active inflammation. Mild fecal retention over the right colon with stool in fluid-filled rectosigmoid and the left colon. Unchanged renal cyst. Aortic atherosclerosis. Atherosclerotic coronary artery disease. Stable L1 fracture. -Underwent Flex Sig which showed that preparation the colon was poor and that there was blood in the rectum and sigmoid colon and the descending colon as well as the mid descending colon and distal descending colon but no active bleeding.  Diverticulosis was noted in the rectosigmoid colon as well as in the sigmoid colon and the descending colon. -After the Flex Sig she had a large BRBPR Bowel Movement and was symptomatic and hypotenstive. 1/23 prior to Colonoscopy she had another bloody bowel movement (a pretty significant BM again with about 500 mL bloody maroon colored matter with lots of clot per nursing); on 09/19/2024 she had a medium red bloody bowel movement with clots and  was lightheaded and dizzy after; 1/26 She had an episode of rectal bleeding with old blood clots.  GI team recommends to continue to hold Xarelto  for another additional 48 hours but if she has ongoing bleeding they are recommending repeating  another CT Angio -Hgb/Hct Trend:  Recent Labs  Lab 09/17/24 0757 09/17/24 1212 09/17/24 1442 09/18/24 0255 09/19/24 0758 09/19/24 1614 09/20/24 0242  HGB 9.8* 9.0* 7.8* 10.9* 11.6* 12.4 12.3  HCT 31.3* 27.5* 23.0* 32.4* 35.2* 37.1 38.4  MCV 87.7 85.7  --  84.6 85.2 84.1 86.5  -S/p multiple IVF boluses; S/p 6 units of pRBCs and 1u of FFP -STAT 09/16/24 CTA GI Bleed showed no active bleed and no acute extravasation on CTA so there was no IR intervention required -CTM in SDU given her previous hypotension and continued bloody bowel movement; Repeated CBC as above -PT-INR was 19.6-1.6 and is now 16.0-1.2 -C/w Supportive Care and C/w Antiemetics w/ po/IV q6hprn Nausea -IR notified and will evaluate the patient but CTA showed no active Bleed -Given Hypotension PCCM was consulted but BP improved with Transfusion so no pressors needed -Colonoscopy done after prep yesterday and showed that the preparation of the colon was inadequate and that there was diverticulosis in the sigmoid colon and the descending colon and the distal transverse colon.  In the descending colon there is evidence of recent bleeding for diverticular opening and clips were placed x 2. -The GI team recommended to keep the patient n.p.o. after the colonoscopy and this was done but now they are recommending CLD.  Clear liquid diet was advanced to full liquid but given her bloody bowel movement we will go back to clears. for now; discussed with the GI physician Dr. Dianna and they are recommending to obtain a stat CTA bleeding scan if she has signs of active bleeding and if she is symptomatic.  Continue to hold apixaban as we can and recommending discussing with the new GI physician Dr. Elicia next week about when it is safe to resume if she is stop bleeding  Neuro-Deficit/Stroke Like Symptoms: Had a worsened facial droop and weakness; Stroke Alert called and Stroke Workup initiated. CT Head done and showed No acute intracranial  abnormality. ASPECTS 10. Ordered CTA Head and Neck but cancelled by Tele Neuro. MRI done and showed No acute intracranial abnormality and  Moderate for age signal changes compatible with chronic small vessel disease are stable from 2023 MRI. -ECHO done and showed EF of 60-65% w/ No RWMA and showed G1DD -PT/OT/SLP; SLP evaluated by PT OT still pending -Checked Lipid Panel and showed Total Cholesterol/HDL Ration 2.7, Cholesterol 81, HDL of 30, LDL of 36, TG of 73, VLDL 15. HbA1c was 6.0  Essential Hypertension -> Hypotension: Continuing to Hold antihypertensives for now given her recurrent BRBPR. CTM BP per Protocol. Last BP reading was 114/69  CAD S/P PCI: Continue Atorvastatin  80 mg po q Evening. Continue to Hold beta-blocker, long-acting nitrates and DOAC given above    Aortic Atherosclerosis / Dyslipidemia, goal LDL below 70: Continue Atorvastatin  80 mg p.o. daily.   Obstructive Sleep Apnea (adult) (pediatric): May use CPAP at bedtime.   Moderate persistent asthma / COPD (chronic obstructive pulmonary disease) w/ Chronic Respiratory Failure with Chronic 3 Liters of Supplemental O2 via [place] (HCC): -Change Albuterol  MDI to Albuterol  Neb 2.5 mg IH q4hprn Wheezing and SOB -CTM Respiratory Status    B12 Deficiency: Continue B12 supplementation with po Cyanocobalamin  5,000 mcg po Daily    History of Recurrent Deep  Vein Thrombosis (DVT): Anticoagulation w/ Rivaroxaban  on hold due to GIB   Type II Diabetes Mellitus with Manifestations (HCC): On CLD but once stable will need to be on a [place] [employer]/ Carbohydrate modified diet. C/w CBG monitoring and decrease Resistant Novolog  SSI AC to Sensitive Novolog  SSI. Check hemoglobin A1c. CTM CBG's per Protocol. CBG Trend ranging  Recent Labs  Lab 09/19/24 1531 09/19/24 1937 09/19/24 2058 09/19/24 2347 09/20/24 0757 09/20/24 1148 09/20/24 1557  GLUCAP 183* 66* 112* 108* 132* 151* 153*   PTSD: C/w Amitriptyline  75 mg po qHS  AKI on CKD Stage 3a /  Metabolic Acidosis: BUN/Cr Trend improved: Recent Labs  Lab 09/15/24 0818 09/16/24 0330 09/17/24 0535 09/17/24 1442 09/18/24 0255 09/19/24 0758 09/20/24 0242  BUN 32* 27* 19 15 11 8 9   CREATININE 1.60* 1.09* 0.99 1.10* 0.88 0.91 0.91  -Has a slight Metabolic Acidosis with a CO2 of 20, Anion Gap of 9, Chloride Level 109 -Avoid Nephrotoxic Medications, Contrast Dyes, Hypotension and Dehydration to Ensure Adequate Renal Perfusion and will need to Renally Adjust Meds. CTM & Trend Renal Fxn carefully & repeat CMP in the AM  Thrombocytopenia: Improved. Plt Count Trend:  Recent Labs  Lab 09/17/24 0535 09/17/24 0757 09/17/24 1212 09/18/24 0255 09/19/24 0758 09/19/24 1614 09/20/24 0242  PLT 137* 160 170 141* 144* 140* 183  -CTM & Trend & Repeat CMP in the AM  Hypoalbuminemia: Patient's Albumin  Lvl went from 3.8 -> 3.1 -> 3.5 -> 3.2 -> 3.3 -> 3.4. CTM & Trend & repeat CMP in the AM   Class III (Morbid) Obesity: Complicates overall prognosis and care. Estimated body mass index is 45.91 kg/m as calculated from the following:   Height as of this encounter: 5' 2 (1.575 m).   Weight as of this encounter: 113.9 kg. Weight Loss and Dietary Counseling given   DVT prophylaxis: SCDs Start: 09/15/24 1131    Code Status: Full Code Family Communication: No family present @ bedside   Disposition Plan:  Level of care: Stepdown Status is: Inpatient Remains inpatient appropriate because:    Consultants:  Gastroenterology PCCM IR TeleNeuro  Procedures:  As delineated as above   Antimicrobials:  Anti-infectives (From admission, onward)    None       Subjective: Seen and examined at bedside and she is doing okay.  Does complain of some mild dizziness especially when moving her head.  Needs PT evaluation.  Continues to have some chronic abdominal discomfort.  Had a bowel movement with clots in it this morning.  No other concerns or complaints this time.  Objective: Vitals:    09/20/24 1400 09/20/24 1500 09/20/24 1600 09/20/24 1620  BP:    114/69  Pulse:   91 91  Resp: 19 15 (!) 21 (!) 22  Temp:   98.1 F (36.7 C)   TempSrc:   Oral   SpO2:   97% 95%  Weight:      Height:        Intake/Output Summary (Last 24 hours) at 09/20/2024 1914 Last data filed at 09/20/2024 1613 Gross per 24 hour  Intake 725 ml  Output --  Net 725 ml   Filed Weights   09/15/24 0806 09/16/24 1223 09/17/24 1327  Weight: 116 kg 116 kg 113.9 kg   Examination: Physical Exam:  Constitutional: WN/WD, morbidly obese chronically ill-appearing Caucasian elderly female Respiratory: Diminished to auscultation bilaterally, no wheezing, rales, rhonchi or crackles. Normal respiratory effort and patient is not tachypenic. No accessory muscle use.  Unlabored  breathing Cardiovascular: RRR, no murmurs / rubs / gallops. S1 and S2 auscultated. No extremity edema.  Abdomen: Soft, tender to palpate.  Distended secondary body habitus. Bowel sounds positive.  GU: Deferred. Musculoskeletal: No clubbing / cyanosis of digits/nails. No joint deformity upper and lower extremities Skin: No rashes, lesions, ulcer on a limited skin evaluation. No induration; Warm and dry.  Neurologic: CN 2-12 grossly intact with no focal deficits. Romberg sign and cerebellar reflexes not assessed.  Psychiatric: Normal judgment and insight. Alert and oriented x 3. Normal mood and appropriate affect.   Data Reviewed: I have personally reviewed following labs and imaging studies  CBC: Recent Labs  Lab 09/17/24 1212 09/17/24 1442 09/18/24 0255 09/19/24 0758 09/19/24 1614 09/20/24 0242  WBC 7.1  --  6.7 5.8 7.5 6.0  NEUTROABS 4.4  --  4.1 3.6 4.8 3.5  HGB 9.0* 7.8* 10.9* 11.6* 12.4 12.3  HCT 27.5* 23.0* 32.4* 35.2* 37.1 38.4  MCV 85.7  --  84.6 85.2 84.1 86.5  PLT 170  --  141* 144* 140* 183   Basic Metabolic Panel: Recent Labs  Lab 09/16/24 0330 09/17/24 0535 09/17/24 1442 09/18/24 0255 09/19/24 0758  09/20/24 0242  NA 140 142 145 142 140 141  K 4.3 4.6 4.2 3.7 4.2 3.6  CL 111 112* 114* 113* 109 107  CO2 20* 19*  --  23 20* 23  GLUCOSE 139* 133* 96 124* 90 124*  BUN 27* 19 15 11 8 9   CREATININE 1.09* 0.99 1.10* 0.88 0.91 0.91  CALCIUM  9.2 9.2  --  8.7* 9.3 10.2  MG  --  2.5*  --  1.9 2.0 2.0  PHOS  --  2.7  --  3.1 3.0 3.2   GFR: Estimated Creatinine Clearance: 66.7 mL/min (by C-G formula based on SCr of 0.91 mg/dL). Liver Function Tests: Recent Labs  Lab 09/16/24 0330 09/17/24 0535 09/18/24 0255 09/19/24 0758 09/20/24 0242  AST 16 22 16 23 20   ALT 11 12 9 10 10   ALKPHOS 75 72 60 71 73  BILITOT 0.7 0.9 0.9 1.0 0.9  PROT 5.3* 5.8* 4.9* 5.3* 5.9*  ALBUMIN  3.1* 3.5 3.2* 3.3* 3.4*   No results for input(s): LIPASE, AMYLASE in the last 168 hours. No results for input(s): AMMONIA in the last 168 hours. Coagulation Profile: Recent Labs  Lab 09/15/24 0816 09/16/24 0330 09/17/24 0757  INR 2.1* 1.6* 1.2   Cardiac Enzymes: No results for input(s): CKTOTAL, CKMB, CKMBINDEX, TROPONINI in the last 168 hours. BNP (last 3 results) No results for input(s): PROBNP in the last 8760 hours. HbA1C: Recent Labs    09/19/24 0758  HGBA1C 6.0*   CBG: Recent Labs  Lab 09/19/24 2058 09/19/24 2347 09/20/24 0757 09/20/24 1148 09/20/24 1557  GLUCAP 112* 108* 132* 151* 153*   Lipid Profile: Recent Labs    09/19/24 0758  CHOL 81  HDL 30*  LDLCALC 36  TRIG 73  CHOLHDL 2.7   Thyroid  Function Tests: No results for input(s): TSH, T4TOTAL, FREET4, T3FREE, THYROIDAB in the last 72 hours. Anemia Panel: No results for input(s): VITAMINB12, FOLATE, FERRITIN, TIBC, IRON, RETICCTPCT in the last 72 hours. Sepsis Labs: No results for input(s): PROCALCITON, LATICACIDVEN in the last 168 hours.  Recent Results (from the past 240 hours)  Surgical PCR screen     Status: Abnormal   Collection Time: 09/15/24  5:55 PM   Specimen: Nasal Mucosa;  Nasal Swab  Result Value Ref Range Status   MRSA, PCR NEGATIVE NEGATIVE Final  Staphylococcus aureus POSITIVE (A) NEGATIVE Final    Comment: (NOTE) The Xpert SA Assay (FDA approved for NASAL specimens in patients 55 years of age and older), is one component of a comprehensive surveillance program. It is not intended to diagnose infection nor to guide or monitor treatment. Performed at [facility], [address] [place], [place] [address]     Radiology Studies: No results found.  Scheduled Meds:  sodium chloride    Intravenous Once   amitriptyline   75 mg Oral QHS   atorvastatin   80 mg Oral QPM   Chlorhexidine  Gluconate Cloth  6 each Topical QHS   cyanocobalamin   5,000 mcg Oral Daily   famotidine   40 mg Oral QHS   feeding supplement  1 Container Oral TID BM   [START ON 09/21/2024] insulin  aspart  0-9 Units Subcutaneous TID WC   mupirocin  ointment  1 Application Nasal BID   nystatin  cream  1 Application Topical BID   sodium chloride  flush  10-40 mL Intracatheter Q12H   Continuous Infusions:   LOS: 5 days   Alejandro Marker, DO [facility] Available via Epic secure chat 7am-7pm After these hours, please refer to coverage provider listed on [email or web address] 09/20/2024, 7:14 PM  "

## 2024-09-20 NOTE — Progress Notes (Incomplete)
 " PROGRESS NOTE    Mary Ferguson  FMW:[id number] DOB: 09-29-1950 DOA: 09/15/2024 PCP: Catherine Charlies LABOR, DO   Brief Narrative:  The Patient Mary Ferguson is a 74 y.o. female with medical history significant of abdominal wall hernia, PTSD, depression, varicella-zoster, rhabdomyolysis, urinary incontinence, diverticulosis/diverticulitis, asthma/COPD, chronic respiratory failure on 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   GI was consulted and took her for a Flex Sig and she had old blood and diverticulosis but no active signs of bleeding. After her Flex Sig she had a large BRBPR and became hypotensive.  She was stabilized after blood transfusion boluses and stat CTA showed no evidence of acute bleeding so IR is not intervening.  She underwent a GI prep for a colonoscopy today and prior to her colonoscopy she had another pretty significant bloody bowel movement blood pressure dropped again and hemoglobin started dropping.  She was given another bolus and given 3 units of PRBCs.  Colonoscopy showed that she had some diverticuli and there was evidence of recent bleeding in the descending colon so the GI physician placed 2 clips and recommended NPO.  The GI team then recommended making her n.p.o. and given this and her tenuous blood sugars we will place her on D5 half-normal saline at 75 mL/h.  On the morning of 09/18/2024  GI team is recommending advancing her diet to clears without subsequently she had strokelike symptoms so a code stroke was called.  She was worked up for this and negative for acute CVA.  Her diet was then readvanced to clears after SLP evaluation.  She is doing well currently diet was advanced to soft  this morning but however she had another bloody bowel movement so she is cut back to clears.  Case was discussed with GI who recommended a CBC and following if she is having more bleeding then getting a stat CTA to see if IR can embolize the source.  Assessment and Plan:  Acute Lower GI bleeding with associated Acute blood loss anemia (ABLA)/Symptomatic Anemia:  -CT Abd/Pelvis done and showed Crescentic hyperdensity over the rectosigmoid junction in the midline posterior pelvis measuring 7 x 14 mm possibly a focus of acute hemorrhage. Punctate hyperdensity within the rectum slightly more inferiorly. Mild diverticulosis throughout the colon without active inflammation. Mild fecal retention over the right colon with stool in fluid-filled rectosigmoid and the left colon. Unchanged renal cyst. Aortic atherosclerosis. Atherosclerotic coronary artery disease. Stable L1 fracture. -Underwent Flex Sig which showed that preparation the colon was poor and that there was blood in the rectum and sigmoid colon and the descending colon as well as the mid descending colon and distal descending colon but no active bleeding.  Diverticulosis was noted in the rectosigmoid colon as well as in the sigmoid colon and the descending colon. -After the Flex Sig she had a large BRBPR Bowel Movement and was symptomatic and hypotenstive. 1/23 prior to Colonoscopy she had another bloody bowel movement (a pretty significant BM again with about 500 mL bloody maroon colored matter with lots of clot per nursing); on 09/19/2024 she had a medium red bloody bowel movement with clots and was lightheaded and dizzy after -Hgb/Hct Trend:  Recent Labs  Lab 09/17/24 0757 09/17/24 1212 09/17/24 1442 09/18/24 0255 09/19/24  9241 09/19/24 1614 09/20/24 0242  HGB 9.8* 9.0* 7.8* 10.9* 11.6* 12.4 12.3  HCT 31.3* 27.5* 23.0* 32.4* 35.2* 37.1 38.4  MCV 87.7 85.7  --  84.6 85.2 84.1 86.5  -S/p multiple IVF boluses; S/p 6 units of pRBCs and 1u of  FFP -STAT 09/16/24 CTA GI Bleed showed no active bleed and no acute extravasation on CTA so there was no IR intervention required -CTM in SDU given her previous hypotension and continued bloody bowel movement; Repeated CBC as above -PT-INR was 19.6-1.6 and is now 16.0-1.2 -C/w Supportive Care and C/w Antiemetics w/ po/IV q6hprn Nausea -IR notified and will evaluate the patient but CTA showed no active Bleed -Given Hypotension PCCM was consulted but BP improved with Transfusion so no pressors needed -Colonoscopy done after prep yesterday and showed that the preparation of the colon was inadequate and that there was diverticulosis in the sigmoid colon and the descending colon and the distal transverse colon.  In the descending colon there is evidence of recent bleeding for diverticular opening and clips were placed x 2. -The GI team recommended to keep the patient n.p.o. after the colonoscopy and this was done but now they are recommending CLD.  Clear liquid diet was advanced to full liquid but given her bloody bowel movement we will go back to clears. for now; discussed with the GI physician Dr. Dianna and they are recommending to obtain a stat CTA bleeding scan if she has signs of active bleeding and if she is symptomatic.  Continue to hold apixaban as we can and recommending discussing with the new GI physician Dr. Elicia next week about when it is safe to resume if she is stop bleeding  Neuro-Deficit/Stroke Like Symptoms: Had a worsened facial droop and weakness; Stroke Alert called and Stroke Workup initiated. CT Head done and showed No acute intracranial abnormality. ASPECTS 10. Ordered CTA Head and Neck but cancelled by Tele Neuro. MRI done and showed No acute intracranial abnormality and  Moderate for age signal changes compatible with chronic small vessel disease are stable from 2023 MRI. -ECHO done and showed EF of 60-65% w/ No RWMA and showed G1DD -PT/OT/SLP; SLP evaluated by PT OT still  pending -Check Lipid Panel and A1c in the AM   Essential Hypertension -> Hypotension: Continuing to Hold antihypertensives for now given her recurrent BRBPR. CTM BP per Protocol. Last BP reading was improved and now elevated and was 171/80  CAD S/P PCI: Continue Atorvastatin  80 mg po q Evening. Continue to Hold beta-blocker, long-acting nitrates and DOAC given above    Aortic Atherosclerosis / Dyslipidemia, goal LDL below 70: Continue Atorvastatin  80 mg p.o. daily.   Obstructive Sleep Apnea (adult) (pediatric): May use CPAP at bedtime.   Moderate persistent asthma / COPD (chronic obstructive pulmonary disease) w/ Chronic Respiratory Failure with Chronic 3 Liters of Supplemental O2 via [place] (HCC): -Change Albuterol  MDI to Albuterol  Neb 2.5 mg IH q4hprn Wheezing and SOB -CTM Respiratory Status    B12 Deficiency: Continue B12 supplementation with po Cyanocobalamin  5,000 mcg po Daily    History of Recurrent Deep Vein Thrombosis (DVT): Anticoagulation w/ Rivaroxaban  on hold due to GIB   Type II Diabetes Mellitus with Manifestations (HCC): On CLD but once stable will need to be on a [place] Healthy/ Carbohydrate modified diet. CBG monitoring with Resistant Novolog  SSI AC. Check hemoglobin A1c. CTM CBG's per Protocol. CBG Trend ranging from 76-183  PTSD: C/w Amitriptyline  75 mg po qHS  AKI on CKD Stage 3a /  Metabolic Acidosis: BUN/Cr Trend improved: Recent Labs  Lab 09/15/24 0818 09/16/24 0330 09/17/24 0535 09/17/24 1442 09/18/24 0255 09/19/24 0758 09/20/24 0242  BUN 32* 27* 19 15 11 8 9   CREATININE 1.60* 1.09* 0.99 1.10* 0.88 0.91 0.91  - Has a slight metabolic acidosis with a CO2 of 20, Anion Gap of 9, Chloride Level 109 -Avoid Nephrotoxic Medications, Contrast Dyes, Hypotension and Dehydration to Ensure Adequate Renal Perfusion and will need to Renally Adjust Meds. CTM & Trend Renal Fxn carefully & repeat CMP in the AM  Hypoalbuminemia: Patient's Albumin  Lvl went from 3.8 -> 3.1 -> 3.5  -> 3.2 -> 3.3 -> 3.4. CTM & Trend & repeat CMP in the AM   Class III (Morbid) Obesity: Complicates overall prognosis and care. Estimated body mass index is 45.91 kg/m as calculated from the following:   Height as of this encounter: 5' 2 (1.575 m).   Weight as of this encounter: 113.9 kg. Weight Loss and Dietary Counseling given     DVT prophylaxis: SCDs Start: 09/15/24 1131    Code Status: Full Code Family Communication:   Disposition Plan:  Level of care: Stepdown Status is: Inpatient {Inpatient:23812}    Consultants:  ***  Procedures:  ***  Antimicrobials:  Anti-infectives (From admission, onward)    None        Subjective: ***  Objective: Vitals:   09/20/24 0000 09/20/24 0200 09/20/24 0400 09/20/24 0752  BP: (!) 117/49 (!) 131/54 (!) 148/69   Pulse: 79 74 90 84  Resp: 16 15 17 12   Temp:    98.1 F (36.7 C)  TempSrc:    Oral  SpO2: 91% 94% 92% 93%  Weight:      Height:       No intake or output data in the 24 hours ending 09/20/24 0808 Filed Weights   09/15/24 0806 09/16/24 1223 09/17/24 1327  Weight: 116 kg 116 kg 113.9 kg    Examination: Physical Exam:  Constitutional: WN/WD, NAD and appears calm and comfortable Eyes: PERRL, lids and conjunctivae normal, sclerae anicteric  ENMT: External Ears, Nose appear normal. Grossly normal hearing. Mucous membranes are moist. Posterior pharynx clear of any exudate or lesions. Normal dentition.  Neck: Appears normal, supple, no cervical masses, normal ROM, no appreciable thyromegaly Respiratory: Clear to auscultation bilaterally, no wheezing, rales, rhonchi or crackles. Normal respiratory effort and patient is not tachypenic. No accessory muscle use.  Cardiovascular: RRR, no murmurs / rubs / gallops. S1 and S2 auscultated. No extremity edema. 2+ pedal pulses. No carotid bruits.  Abdomen: Soft, non-tender, non-distended. No masses palpated. No appreciable hepatosplenomegaly. Bowel sounds positive.  GU:  Deferred. Musculoskeletal: No clubbing / cyanosis of digits/nails. No joint deformity upper and lower extremities. Good ROM, no contractures. Normal strength and muscle tone.  Skin: No rashes, lesions, ulcers. No induration; Warm and dry.  Neurologic: CN 2-12 grossly intact with no focal deficits. Sensation intact in all 4 Extremities, DTR normal. Strength 5/5 in all 4. Romberg sign cerebellar reflexes not assessed.  Psychiatric: Normal judgment and insight. Alert and oriented x 3. Normal mood and appropriate affect.   Data Reviewed: I have personally reviewed following labs and imaging studies  CBC: Recent Labs  Lab 09/17/24 1212 09/17/24 1442 09/18/24 0255 09/19/24 0758 09/19/24 1614 09/20/24 0242  WBC 7.1  --  6.7 5.8 7.5 6.0  NEUTROABS 4.4  --  4.1 3.6 4.8 3.5  HGB 9.0* 7.8* 10.9* 11.6* 12.4 12.3  HCT 27.5* 23.0* 32.4* 35.2* 37.1 38.4  MCV 85.7  --  84.6 85.2 84.1 86.5  PLT 170  --  141* 144* 140* 183   Basic Metabolic Panel: Recent Labs  Lab 09/16/24 0330 09/17/24 0535 09/17/24 1442 09/18/24 0255 09/19/24 0758 09/20/24 0242  NA 140 142 145 142 140 141  K 4.3 4.6 4.2 3.7 4.2 3.6  CL 111 112* 114* 113* 109 107  CO2 20* 19*  --  23 20* 23  GLUCOSE 139* 133* 96 124* 90 124*  BUN 27* 19 15 11 8 9   CREATININE 1.09* 0.99 1.10* 0.88 0.91 0.91  CALCIUM  9.2 9.2  --  8.7* 9.3 10.2  MG  --  2.5*  --  1.9 2.0 2.0  PHOS  --  2.7  --  3.1 3.0 3.2   GFR: Estimated Creatinine Clearance: 66.7 mL/min (by C-G formula based on SCr of 0.91 mg/dL). Liver Function Tests: Recent Labs  Lab 09/16/24 0330 09/17/24 0535 09/18/24 0255 09/19/24 0758 09/20/24 0242  AST 16 22 16 23 20   ALT 11 12 9 10 10   ALKPHOS 75 72 60 71 73  BILITOT 0.7 0.9 0.9 1.0 0.9  PROT 5.3* 5.8* 4.9* 5.3* 5.9*  ALBUMIN  3.1* 3.5 3.2* 3.3* 3.4*   No results for input(s): LIPASE, AMYLASE in the last 168 hours. No results for input(s): AMMONIA in the last 168 hours. Coagulation Profile: Recent Labs   Lab 09/15/24 0816 09/16/24 0330 09/17/24 0757  INR 2.1* 1.6* 1.2   Cardiac Enzymes: No results for input(s): CKTOTAL, CKMB, CKMBINDEX, TROPONINI in the last 168 hours. BNP (last 3 results) No results for input(s): PROBNP in the last 8760 hours. HbA1C: Recent Labs    09/19/24 0758  HGBA1C 6.0*   CBG: Recent Labs  Lab 09/18/24 1541 09/18/24 2129 09/19/24 1531 09/19/24 1937 09/19/24 2347  GLUCAP 91 85 183* 66* 108*   Lipid Profile: Recent Labs    09/19/24 0758  CHOL 81  HDL 30*  LDLCALC 36  TRIG 73  CHOLHDL 2.7   Thyroid  Function Tests: No results for input(s): TSH, T4TOTAL, FREET4, T3FREE, THYROIDAB in the last 72 hours. Anemia Panel: No results for input(s): VITAMINB12, FOLATE, FERRITIN, TIBC, IRON, RETICCTPCT in the last 72 hours. Sepsis Labs: No results for input(s): PROCALCITON, LATICACIDVEN in the last 168 hours.  Recent Results (from the past 240 hours)  Surgical PCR screen     Status: Abnormal   Collection Time: 09/15/24  5:55 PM   Specimen: Nasal Mucosa; Nasal Swab  Result Value Ref Range Status   MRSA, PCR NEGATIVE NEGATIVE Final   Staphylococcus aureus POSITIVE (A) NEGATIVE Final    Comment: (NOTE) The Xpert SA Assay (FDA approved for NASAL specimens in patients 24 years of age and older), is one component of a comprehensive surveillance program. It is not intended to diagnose infection nor to guide or monitor treatment. Performed at [facility], [address], [place], [place] [address]      Radiology Studies: ECHOCARDIOGRAM COMPLETE Result Date: 09/18/2024    ECHOCARDIOGRAM REPORT   Patient Name:   Mary Ferguson Date of Exam: 09/18/2024 Medical Rec #:  [id number]   Height:       62.0 in Accession #:    [id number]  Weight:       251.0 lb Date of Birth:  [birth year]   BSA:          2.105 m Patient Age:    73 years    BP:  163/150 mmHg Patient Gender: F           HR:           70 bpm.  Exam Location:  Inpatient Procedure: 2D Echo, Color Doppler and Cardiac Doppler (Both Spectral and Color            Flow Doppler were utilized during procedure). Indications:    Stroke I63.9  History:        Patient has prior history of Echocardiogram examinations, most                 recent 06/18/2019. Previous Myocardial Infarction; Risk                 Factors:Hypertension.  Sonographer:    Sydnee Wilson RDCS Referring Phys: [id number] ALEJANDRO LATIF [facility]  Sonographer Comments: Image acquisition challenging due to patient body habitus. IMPRESSIONS  1. Left ventricular ejection fraction, by estimation, is 60 to 65%. The left ventricle has normal function. The left ventricle has no regional wall motion abnormalities. Left ventricular diastolic parameters are consistent with Grade I diastolic dysfunction (impaired relaxation).  2. Right ventricular systolic function is normal. The right ventricular size is normal.  3. Left atrial size was mildly dilated.  4. The mitral valve is normal in structure. No evidence of mitral valve regurgitation. No evidence of mitral stenosis.  5. The aortic valve is tricuspid. There is mild calcification of the aortic valve. Aortic valve regurgitation is not visualized. Aortic valve sclerosis/calcification is present, without any evidence of aortic stenosis.  6. The inferior vena cava is normal in size with greater than 50% respiratory variability, suggesting right atrial pressure of 3 mmHg. Comparison(s): No significant change from prior study. Prior images reviewed side by side. FINDINGS  Left Ventricle: Left ventricular ejection fraction, by estimation, is 60 to 65%. The left ventricle has normal function. The left ventricle has no regional wall motion abnormalities. The left ventricular internal cavity size was normal in size. There is  no left ventricular hypertrophy. Left ventricular diastolic parameters are consistent with Grade I diastolic dysfunction (impaired relaxation). Right  Ventricle: The right ventricular size is normal. No increase in right ventricular wall thickness. Right ventricular systolic function is normal. Left Atrium: Left atrial size was mildly dilated. Right Atrium: Right atrial size was normal in size. Pericardium: There is no evidence of pericardial effusion. Mitral Valve: The mitral valve is normal in structure. Mild mitral annular calcification. No evidence of mitral valve regurgitation. No evidence of mitral valve stenosis. Tricuspid Valve: The tricuspid valve is normal in structure. Tricuspid valve regurgitation is not demonstrated. No evidence of tricuspid stenosis. Aortic Valve: The aortic valve is tricuspid. There is mild calcification of the aortic valve. Aortic valve regurgitation is not visualized. Aortic valve sclerosis/calcification is present, without any evidence of aortic stenosis. Aortic valve mean gradient measures 5.0 mmHg. Aortic valve peak gradient measures 9.2 mmHg. Aortic valve area, by VTI measures 2.33 cm. Pulmonic Valve: The pulmonic valve was not well visualized. Pulmonic valve regurgitation is not visualized. No evidence of pulmonic stenosis. Aorta: The aortic root and ascending aorta are structurally normal, with no evidence of dilitation. Venous: The inferior vena cava is normal in size with greater than 50% respiratory variability, suggesting right atrial pressure of 3 mmHg. IAS/Shunts: No atrial level shunt detected by color flow Doppler.  LEFT VENTRICLE PLAX 2D LVIDd:         3.20 cm      Diastology LVIDs:  2.30 cm      LV e' medial:    13.50 cm/s LV PW:         1.20 cm      LV E/e' medial:  4.5 LV IVS:        0.90 cm      LV e' lateral:   7.83 cm/s LVOT diam:     2.00 cm      LV E/e' lateral: 7.8 LV SV:         77 LV SV Index:   36 LVOT Area:     3.14 cm  LV Volumes (MOD) LV vol d, MOD A2C: 84.8 ml LV vol d, MOD A4C: 114.0 ml LV vol s, MOD A2C: 35.1 ml LV vol s, MOD A4C: 42.6 ml LV SV MOD A2C:     49.7 ml LV SV MOD A4C:     114.0  ml LV SV MOD BP:      62.8 ml RIGHT VENTRICLE RV S prime:     15.40 cm/s TAPSE (M-mode): 2.0 cm LEFT ATRIUM             Index        RIGHT ATRIUM           Index LA diam:        4.00 cm 1.90 cm/m   RA Area:     14.00 cm LA Vol (A2C):   44.9 ml 21.33 ml/m  RA Volume:   33.80 ml  16.05 ml/m LA Vol (A4C):   44.6 ml 21.18 ml/m LA Biplane Vol: 47.2 ml 22.42 ml/m  AORTIC VALVE AV Area (Vmax):    2.19 cm AV Area (Vmean):   1.95 cm AV Area (VTI):     2.33 cm AV Vmax:           152.00 cm/s AV Vmean:          104.000 cm/s AV VTI:            0.329 m AV Peak Grad:      9.2 mmHg AV Mean Grad:      5.0 mmHg LVOT Vmax:         106.00 cm/s LVOT Vmean:        64.500 cm/s LVOT VTI:          0.244 m LVOT/AV VTI ratio: 0.74  AORTA Ao Root diam: 3.40 cm Ao Asc diam:  2.80 cm MITRAL VALVE MV Area (PHT): 2.86 cm    SHUNTS MV E velocity: 60.80 cm/s  Systemic VTI:  0.24 m MV A velocity: 92.50 cm/s  Systemic Diam: 2.00 cm MV E/A ratio:  0.66 Mihai Croitoru MD Electronically signed by Jerel Balding MD Signature Date/Time: 09/18/2024/1:54:07 PM    Final    MR BRAIN WO CONTRAST Result Date: 09/18/2024 EXAM: MRI BRAIN WITHOUT CONTRAST 09/18/2024 10:41:00 AM TECHNIQUE: Multiplanar multisequence MRI of the head/brain was performed without the administration of intravenous contrast. Axial T1 weighted imaging is degraded by motion despite repeated imaging attempts. COMPARISON: CT head 09/18/2024, brain MRI 07/21/2022. CLINICAL HISTORY: 74 year old female with acute neurological deficit, suspected stroke, and code stroke presentation. FINDINGS: BRAIN AND VENTRICLES: On diffusion weighted imaging, there is mild susceptibility artifact associated with bulky falcine and left tentorial calcifications. No acute infarct. No intracranial hemorrhage. No mass. No midline shift. No hydrocephalus. The sella is unremarkable. Normal flow voids. Some generalized intracranial artery tortuosity. Susceptibility associated with the rounded calcification  pattern near the left tentorium is again noted. Scattered small periventricular  and subcortical white matter T2 and FLAIR hyperintensity in both hemispheres has not significantly changed, mild to moderate for age. Similar T2 heterogeneity in the deep gray matter nuclei and pons also stable. No cervical encephalomalacia or chronic cerebral blood products identified. ORBITS: No significant abnormality. SINUSES AND MASTOIDS: Mild left mastoid effusion has not significantly changed. BONES AND SOFT TISSUES: Normal marrow signal. No soft tissue abnormality. CERVICAL SPINE: Negative visible cervical spine. IMPRESSION: 1. No acute intracranial abnormality. 2. Moderate for age signal changes compatible with chronic small vessel disease are stable from 2023 MRI. Electronically signed by: Helayne Hurst MD 09/18/2024 10:47 AM EST RP Workstation: [id number]   CT HEAD CODE STROKE WO CONTRAST Result Date: 09/18/2024 EXAM: CT HEAD WITHOUT CONTRAST 09/18/2024 10:00:13 AM TECHNIQUE: CT of the head was performed without the administration of intravenous contrast. Automated exposure control, iterative reconstruction, and/or weight based adjustment of the mA/kV was utilized to reduce the radiation dose to as low as reasonably achievable. COMPARISON: Brain MRI 07/21/2022 and CT head 07/20/2022. CLINICAL HISTORY: 74 year old female. Acute neurological deficit, stroke suspected. FINDINGS: BRAIN AND VENTRICLES: No acute hemorrhage. No evidence of acute infarct. No hydrocephalus. No extra-axial collection. No mass effect or midline shift. Stable brain volume, normal for age. White differentiation stable and within normal limits for age. No suspicious intracranial vascular hyperdensity. Rounded calcification along the left tentorium is unchanged, could be a dural calcification or small stable calcified meningioma (coronal image 50). No regional mass effect or edema. ORBITS: No gaze deviation. SINUSES: Paranasal sinuses, tympanic cavities and  mastoids are well aerated. SOFT TISSUES AND SKULL: No acute soft tissue abnormality. No skull fracture. Hyperostosis of the calvarium. Calcified atherosclerosis at the skull base. ALBERTA STROKE PROGRAM EARLY CT SCORE (ASPECTS): ganglionic (caudate, ic, lentiform nucleus, insula, M1-m3): 7 Supraganglionic (m4-m6): 3 Total: 10 IMPRESSION: 1. No acute intracranial abnormality.  ASPECTS 10. 2. These results were communicated to Dr. Germaine at 10:05 on 09/18/2024 by text page via the [facility] messaging system. Electronically signed by: Helayne Hurst MD 09/18/2024 10:06 AM EST RP Workstation: [id number]     Scheduled Meds:  sodium chloride    Intravenous Once   amitriptyline   75 mg Oral QHS   atorvastatin   80 mg Oral QPM   Chlorhexidine  Gluconate Cloth  6 each Topical QHS   cyanocobalamin   5,000 mcg Oral Daily   famotidine   40 mg Oral QHS   feeding supplement  1 Container Oral TID BM   insulin  aspart  0-20 Units Subcutaneous TID WC   mupirocin  ointment  1 Application Nasal BID   nystatin  cream  1 Application Topical BID   sodium chloride  flush  10-40 mL Intracatheter Q12H   Continuous Infusions:   LOS: 5 days    Time spent: *** minutes spent on chart review, discussion with nursing staff, consultants, updating family and interview/physical exam; more than 50% of that time was spent in counseling and/or coordination of care.   Alejandro Lazarus Marker, DO [facility] Available via Epic secure chat 7am-7pm After these hours, please refer to coverage provider listed on [email or web address] 09/20/2024, 8:08 AM   "

## 2024-09-20 NOTE — Plan of Care (Signed)

## 2024-09-20 NOTE — Progress Notes (Signed)
 [facility] Progress Note  Mary Ferguson 74 y.o. November 29, 1950  CC:  GI bleed   Subjective: Patient seen and examined at bedside.  She had 1 episode of rectal bleeding with blood clots.  Complaining of mild abdominal discomfort  ROS : Afebrile, negative for chest pain   Objective: Vital signs in last 24 hours: Vitals:   09/20/24 1050 09/20/24 1055  BP:    Pulse: 91 (!) 104  Resp: 18 18  Temp:    SpO2: 98% 99%    Physical Exam: Resting comfortably, not in acute distress, abdominal exam benign.  Mood and affect normal.  Alert and oriented x 3.  Lab Results: Recent Labs    09/19/24 0758 09/20/24 0242  NA 140 141  K 4.2 3.6  CL 109 107  CO2 20* 23  GLUCOSE 90 124*  BUN 8 9  CREATININE 0.91 0.91  CALCIUM  9.3 10.2  MG 2.0 2.0  PHOS 3.0 3.2   Recent Labs    09/19/24 0758 09/20/24 0242  AST 23 20  ALT 10 10  ALKPHOS 71 73  BILITOT 1.0 0.9  PROT 5.3* 5.9*  ALBUMIN  3.3* 3.4*   Recent Labs    09/19/24 1614 09/20/24 0242  WBC 7.5 6.0  NEUTROABS 4.8 3.5  HGB 12.4 12.3  HCT 37.1 38.4  MCV 84.1 86.5  PLT 140* 183   No results for input(s): LABPROT, INR in the last 72 hours.    Assessment/Plan: - Acute diverticular bleed status post colonoscopy on September 17, 2024 with finding concerning for active bleeding from ascending colon diverticulum status post Hemoclip placement.   - Acute blood loss anemia.  Hemoglobin was down to 7.8 few days ago.  Status post blood transfusion.  Hemoglobin normal yesterday as well as today. -History of DVT.  Xarelto  on hold. - History of coronary artery disease - History of COPD and other chronic conditions  Recommendations -------------------------- - She had 1 episode of rectal bleeding with old blood clots.  Hemoglobin is stable.  Would recommend to hold Xarelto  for additional 48 hours.  If ongoing bleeding, which he may need another CT angio. - GI will follow.   [name] MD, FACP 09/20/2024, 11:50  AM  Contact #  [phone]

## 2024-09-21 DIAGNOSIS — K922 Gastrointestinal hemorrhage, unspecified: Secondary | ICD-10-CM | POA: Diagnosis not present

## 2024-09-21 DIAGNOSIS — D62 Acute posthemorrhagic anemia: Secondary | ICD-10-CM | POA: Diagnosis not present

## 2024-09-21 DIAGNOSIS — I2510 Atherosclerotic heart disease of native coronary artery without angina pectoris: Secondary | ICD-10-CM | POA: Diagnosis not present

## 2024-09-21 DIAGNOSIS — I10 Essential (primary) hypertension: Secondary | ICD-10-CM | POA: Diagnosis not present

## 2024-09-21 DIAGNOSIS — E785 Hyperlipidemia, unspecified: Secondary | ICD-10-CM | POA: Diagnosis not present

## 2024-09-21 DIAGNOSIS — G4733 Obstructive sleep apnea (adult) (pediatric): Secondary | ICD-10-CM | POA: Diagnosis not present

## 2024-09-21 DIAGNOSIS — J4542 Moderate persistent asthma with status asthmaticus: Secondary | ICD-10-CM | POA: Diagnosis not present

## 2024-09-21 DIAGNOSIS — J42 Unspecified chronic bronchitis: Secondary | ICD-10-CM | POA: Diagnosis not present

## 2024-09-21 DIAGNOSIS — E538 Deficiency of other specified B group vitamins: Secondary | ICD-10-CM | POA: Diagnosis not present

## 2024-09-21 DIAGNOSIS — Z6841 Body Mass Index (BMI) 40.0 and over, adult: Secondary | ICD-10-CM | POA: Diagnosis not present

## 2024-09-21 DIAGNOSIS — I700 Atherosclerosis of aorta: Secondary | ICD-10-CM | POA: Diagnosis not present

## 2024-09-21 DIAGNOSIS — Z86718 Personal history of other venous thrombosis and embolism: Secondary | ICD-10-CM | POA: Diagnosis not present

## 2024-09-21 LAB — COMPREHENSIVE METABOLIC PANEL WITH GFR
ALT: 8 U/L (ref 0–44)
AST: 17 U/L (ref 15–41)
Albumin: 3.4 g/dL — ABNORMAL LOW (ref 3.5–5.0)
Alkaline Phosphatase: 76 U/L (ref 38–126)
Anion gap: 9 (ref 5–15)
BUN: 8 mg/dL (ref 8–23)
CO2: 25 mmol/L (ref 22–32)
Calcium: 10.5 mg/dL — ABNORMAL HIGH (ref 8.9–10.3)
Chloride: 107 mmol/L (ref 98–111)
Creatinine, Ser: 0.98 mg/dL (ref 0.44–1.00)
GFR, Estimated: >60 mL/min
Glucose, Bld: 142 mg/dL — ABNORMAL HIGH (ref 70–99)
Potassium: 3.3 mmol/L — ABNORMAL LOW (ref 3.5–5.1)
Sodium: 141 mmol/L (ref 135–145)
Total Bilirubin: 0.6 mg/dL (ref 0.0–1.2)
Total Protein: 5.5 g/dL — ABNORMAL LOW (ref 6.5–8.1)

## 2024-09-21 LAB — CBC WITH DIFFERENTIAL/PLATELET
Abs Immature Granulocytes: 0.04 10*3/uL (ref 0.00–0.07)
Basophils Absolute: 0 10*3/uL (ref 0.0–0.1)
Basophils Relative: 1 %
Eosinophils Absolute: 0.5 10*3/uL (ref 0.0–0.5)
Eosinophils Relative: 8 %
HCT: 36.8 % (ref 36.0–46.0)
Hemoglobin: 11.7 g/dL — ABNORMAL LOW (ref 12.0–15.0)
Immature Granulocytes: 1 %
Lymphocytes Relative: 29 %
Lymphs Abs: 1.8 10*3/uL (ref 0.7–4.0)
MCH: 27.7 pg (ref 26.0–34.0)
MCHC: 31.8 g/dL (ref 30.0–36.0)
MCV: 87.2 fL (ref 80.0–100.0)
Monocytes Absolute: 0.6 10*3/uL (ref 0.1–1.0)
Monocytes Relative: 10 %
Neutro Abs: 3.2 10*3/uL (ref 1.7–7.7)
Neutrophils Relative %: 51 %
Platelets: 212 10*3/uL (ref 150–400)
RBC: 4.22 MIL/uL (ref 3.87–5.11)
RDW: 16.3 % — ABNORMAL HIGH (ref 11.5–15.5)
WBC: 6.2 10*3/uL (ref 4.0–10.5)
nRBC: 0 % (ref 0.0–0.2)

## 2024-09-21 LAB — PHOSPHORUS: Phosphorus: 4.1 mg/dL (ref 2.5–4.6)

## 2024-09-21 LAB — GLUCOSE, CAPILLARY
Glucose-Capillary: 170 mg/dL — ABNORMAL HIGH (ref 70–99)
Glucose-Capillary: 190 mg/dL — ABNORMAL HIGH (ref 70–99)
Glucose-Capillary: 113 mg/dL — ABNORMAL HIGH (ref 70–99)
Glucose-Capillary: 157 mg/dL — ABNORMAL HIGH (ref 70–99)

## 2024-09-21 LAB — MAGNESIUM: Magnesium: 2 mg/dL (ref 1.7–2.4)

## 2024-09-21 MED ORDER — POTASSIUM CHLORIDE CRYS ER 20 MEQ PO TBCR
40.0000 meq | EXTENDED_RELEASE_TABLET | Freq: Two times a day (BID) | ORAL | Status: AC
  Administered 2024-09-21 (×2): 40 meq via ORAL
  Filled 2024-09-21 (×2): qty 2

## 2024-09-21 MED ORDER — NYSTATIN 100000 UNIT/GM EX POWD
Freq: Three times a day (TID) | CUTANEOUS | Status: DC
  Filled 2024-09-21: qty 15

## 2024-09-21 NOTE — Plan of Care (Signed)

## 2024-09-21 NOTE — Progress Notes (Addendum)
 " PROGRESS NOTE    Mary Ferguson  FMW:[id number] DOB: 1951/01/22 DOA: 09/15/2024 PCP: Catherine Charlies LABOR, DO   Brief Narrative:  The Patient Mary Ferguson is a 74 y.o. 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   Anticoagulation was held and GI was been consulted and she underwent a flex sig colonoscopy as well as a stat CTA GI bleed; after there is no evidence of GI bleeding on the stat CTA the GI team recommended  colonoscopy where she was found to have some diverticula with evidence of recent bleeding so 2 clips were placed.  She has been typed and screened and transfused 6 units of PRBCs and 1 unit of FFP while hospitalized given symptomatic Anemia.   On the morning of 09/18/2024 the GI team was recommending to advance her diet to clears.  Subsequently she had strokelike symptoms where she is noted to have a left-sided facial droop, left-sided weakness so a code stroke was called.  She was worked up for this and negative for acute CVA.  Her diet was then readvanced to clears after SLP evaluation.    On 09/19/2024 diet was advanced to soft but she has had some bloody bowel movements with some symptoms of dizziness intermittently.  Had a small amount of bright red blood per rectum and was passing some bleeding with blood clots yesterday.  09/21/2024: Fairly well and had no further bleeding episodes noted so she was transferred to the telemetry floor..  Diet advanced to FULL this AM and will go to SOFT this  evening.  GI recommends resuming Xarelto  likely 09/22/2024 at half the dose and then monitoring another 24 hours after to see if we can advance it to full dose Xarelto   Assessment and Plan:  Acute Lower GI bleeding with associated ABLA/Symptomatic Anemia:  -CT Abd/Pelvis done and showed Crescentic hyperdensity over the rectosigmoid junction in the midline posterior pelvis measuring 7 x 14 mm possibly a focus of acute hemorrhage. Punctate hyperdensity within the rectum slightly more inferiorly. Mild diverticulosis throughout the colon without active inflammation. Mild fecal retention over the right colon with stool in fluid-filled rectosigmoid and the left colon. Unchanged renal cyst. Aortic atherosclerosis. Atherosclerotic coronary artery disease. Stable L1 fracture. -GI Consulted and recommended Flex Sig; After the Flex Sig she had a large BRBPR Bowel Movement and was symptomatic and hypotenstive.  Given her initial hypotension PCCM was consulted and she was transfused 3 units of PRBCs but then subsequently improved without pressor support so then the PCCM team signed off. -STAT 09/16/24 CTA GI Bleed showed no active bleed and no acute extravasation on CTA so there was no IR intervention required as they were consulted earlier  -PT-INR was 19.6-1.6 and is now 16.0-1.2 on last check  -On 1/23 prior to Colonoscopy she had another bloody bowel movement (a pretty significant BM again with about 500 mL bloody maroon colored matter with lots of clot per nursing);  -Colonoscopy done after prep yesterday and showed that the preparation of the colon was inadequate and that there was diverticulosis in the sigmoid colon and the descending colon and the distal transverse colon.  In  the descending colon there is evidence of recent bleeding for diverticular opening and clips were placed x 2. -On 09/19/2024 she had a medium red bloody bowel movement with clots and was lightheaded and dizzy after; 1/26 She had an episode  of rectal bleeding with old blood clots.  GI team recommends to continue to hold Xarelto  for 48 hours total but if she is ongoing bleeding recommend repeating CTA.  They are now recommending resuming Xarelto  at half the dose on 09/22/2024 -Hgb/Hct Trend improved after transfusion but now relatively stable and is 11.7/36.8 w/ MCV of 87.2.  -S/p multiple IVF boluses; S/p 6 units of pRBCs and 1u of FFP -C/w Supportive Care and C/w Antiemetics w/ po/IV q6hprn Nausea - Diet being advanced as tolerated and went to fulls this a.m. and will go to SOFT Diet this evening  Neuro-Deficit/Stroke Like Symptoms: Improved, Had a worsened facial droop and weakness on 09/18/2024; Stroke Alert called and Stroke Workup initiated. CT Head done and showed No acute intracranial abnormality. ASPECTS 10. Ordered CTA Head and Neck but cancelled by Tele Neuro. MRI done and showed No acute intracranial abnormality and  Moderate for age signal changes compatible with chronic small vessel disease are stable from 2023 MRI. -ECHO done and showed EF of 60-65% w/ No RWMA and showed G1DD -PT/OT/SLP; SLP evaluated and recommended resuming die. PT/OT recommending SNF -Checked Lipid Panel and showed Total Cholesterol/HDL Ration 2.7, Cholesterol 81, HDL of 30, LDL of 36, TG of 73, VLDL 15. HbA1c was 6.0  MASD/Intertrigo: Nystatin  Cream and Powder. WOC nurse consulted   HypoK+: K+ is now 3.3. Replete w/ po KCL 40 mEQ BID x2. CTM & Replete as Necessary. Repeat CMP in the AM   Essential Hypertension -> Hypotension: Continuing to Hold antihypertensives for now given her recurrent BRBPR. CTM BP per Protocol. Last BP reading was 114/69  CAD S/P PCI: Continue Atorvastatin  80 mg po q Evening. Continue to Hold beta-blocker, long-acting nitrates and DOAC given above    Aortic Atherosclerosis / Dyslipidemia, goal LDL below 70: Continue Atorvastatin  80 mg p.o. daily.   Tachycardia: Likely in the setting of deconditioning.  Continue monitor and  trend heart rate and if necessary will give another bolus  Obstructive Sleep Apnea (adult) (pediatric): May use CPAP at bedtime.   Moderate persistent asthma / COPD (chronic obstructive pulmonary disease) w/ Chronic Respiratory Failure with Chronic 3 Liters of Supplemental O2 via [place] (HCC): -Change Albuterol  MDI to Albuterol  Neb 2.5 mg IH q4hprn Wheezing and SOB -CTM Respiratory Status carefully    B12 Deficiency: Continue B12 supplementation with po Cyanocobalamin  5,000 mcg po Daily    History of Recurrent Deep Vein Thrombosis (DVT): Anticoagulation w/ Rivaroxaban  on hold due to GIB but GI recommending resuming 09/22/24 at low dose.    Type II Diabetes Mellitus with Manifestations (HCC): On CLD but once stable will need to be on a [place] Healthy/ Carbohydrate modified diet. C/w CBG monitoring and decrease Resistant Novolog  SSI AC to Sensitive Novolog  SSI. Check hemoglobin A1c. CTM CBG's per Protocol. CBG Trend ranging from 113-190 on the last 7 checks.  PTSD: C/w Amitriptyline  75 mg po qHS  AKI on CKD Stage 3a / Metabolic Acidosis: BUN/Cr Trend improved and is stable at 9/0.98 on last check. Had a slight Metabolic Acidosis but this is now improved. Avoid Nephrotoxic Medications, Contrast Dyes, Hypotension and Dehydration to Ensure Adequate Renal Perfusion and will need to Renally Adjust Meds. CTM & Trend Renal Fxn carefully & repeat CMP in the  AM  Thrombocytopenia: Improved. Plt Count Trend trended down to 140 and now improved and is 212 on last check. CTM & Trend & Repeat CMP in the AM  Hypoalbuminemia: Patient's Albumin  Lvl is now 3.4. CTM & Trend & repeat CMP in the AM  Class III (Morbid) Obesity: Complicates overall prognosis and care. Estimated body mass index is 45.91 kg/m as calculated from the following:   Height as of this encounter: 5' 2 (1.575 m).   Weight as of this encounter: 113.9 kg. Weight Loss and Dietary Counseling given   DVT prophylaxis: SCDs Start: 09/15/24 1131     Code Status: Full Code Family Communication: No family present @ bedside  Disposition Plan:  Level of care: Telemetry Status is: Inpatient Remains inpatient appropriate because: Needs further clinical monitoring and clearance by the specialist prior to discharge.  PT OT recommending SNF   Consultants:  Gastroenterology PCCM IR TeleNeuro  Procedures:  As delineated as above  Antimicrobials:  Anti-infectives (From admission, onward)    None       Subjective: Seen and examined at bedside he is wanting to eat something better than clear liquids.  No nausea or vomiting.  Still having some abdominal discomfort which is chronic.  Denies any further bleeding currently.  No chest pain or shortness of breath.  No other concerns or complaints this time but remains very weak  Objective: Vitals:   09/21/24 1400 09/21/24 1500 09/21/24 1528 09/21/24 1911  BP:   (!) 129/91 138/64  Pulse: 96 88 (!) 108 80  Resp: (!) 22 18 16 20   Temp:   98 F (36.7 C) 98.2 F (36.8 C)  TempSrc:    Oral  SpO2: 97% 97% 100%   Weight:      Height:        Intake/Output Summary (Last 24 hours) at 09/21/2024 2116 Last data filed at 09/21/2024 2000 Gross per 24 hour  Intake 482 ml  Output --  Net 482 ml   Filed Weights   09/15/24 0806 09/16/24 1223 09/17/24 1327  Weight: 116 kg 116 kg 113.9 kg   Examination: Physical Exam:  Constitutional: WN/WD morbidly obese chronically ill-appearing Caucasian female who appears fatigued Respiratory: Diminished to auscultation bilaterally, no wheezing, rales, rhonchi or crackles. Normal respiratory effort and patient is not tachypenic. No accessory muscle use.  Unlabored breathing Cardiovascular: RRR, no murmurs / rubs / gallops. S1 and S2 auscultated.  Mild extremity edema Abdomen: Soft, tender to palpate and distended secondary body habitus. Bowel sounds positive.  GU: Deferred. Musculoskeletal: No clubbing / cyanosis of digits/nails. No joint deformity upper  and lower extremities.  Skin: No rashes, lesions, ulcers on a limited skin. No induration; Warm and dry.  Neurologic: CN 2-12 grossly intact with no focal deficits. Romberg sign and cerebellar reflexes not assessed.  Psychiatric: Normal judgment and insight. Alert and oriented x 3. Normal mood and appropriate affect.   Data Reviewed: I have personally reviewed following labs and imaging studies  CBC: Recent Labs  Lab 09/18/24 0255 09/19/24 0758 09/19/24 1614 09/20/24 0242 09/21/24 0317  WBC 6.7 5.8 7.5 6.0 6.2  NEUTROABS 4.1 3.6 4.8 3.5 3.2  HGB 10.9* 11.6* 12.4 12.3 11.7*  HCT 32.4* 35.2* 37.1 38.4 36.8  MCV 84.6 85.2 84.1 86.5 87.2  PLT 141* 144* 140* 183 212   Basic Metabolic Panel: Recent Labs  Lab 09/17/24 0535 09/17/24 1442 09/18/24 0255 09/19/24 0758 09/20/24 0242 09/21/24 0317  NA 142 145 142 140 141 141  K 4.6  4.2 3.7 4.2 3.6 3.3*  CL 112* 114* 113* 109 107 107  CO2 19*  --  23 20* 23 25  GLUCOSE 133* 96 124* 90 124* 142*  BUN 19 15 11 8 9 8   CREATININE 0.99 1.10* 0.88 0.91 0.91 0.98  CALCIUM  9.2  --  8.7* 9.3 10.2 10.5*  MG 2.5*  --  1.9 2.0 2.0 2.0  PHOS 2.7  --  3.1 3.0 3.2 4.1   GFR: Estimated Creatinine Clearance: 61.9 mL/min (by C-G formula based on SCr of 0.98 mg/dL). Liver Function Tests: Recent Labs  Lab 09/17/24 0535 09/18/24 0255 09/19/24 0758 09/20/24 0242 09/21/24 0317  AST 22 16 23 20 17   ALT 12 9 10 10 8   ALKPHOS 72 60 71 73 76  BILITOT 0.9 0.9 1.0 0.9 0.6  PROT 5.8* 4.9* 5.3* 5.9* 5.5*  ALBUMIN  3.5 3.2* 3.3* 3.4* 3.4*   No results for input(s): LIPASE, AMYLASE in the last 168 hours. No results for input(s): AMMONIA in the last 168 hours. Coagulation Profile: Recent Labs  Lab 09/15/24 0816 09/16/24 0330 09/17/24 0757  INR 2.1* 1.6* 1.2   Cardiac Enzymes: No results for input(s): CKTOTAL, CKMB, CKMBINDEX, TROPONINI in the last 168 hours. BNP (last 3 results) No results for input(s): PROBNP in the last 8760  hours. HbA1C: Recent Labs    09/19/24 0758  HGBA1C 6.0*   CBG: Recent Labs  Lab 09/20/24 2140 09/21/24 0744 09/21/24 1208 09/21/24 1648 09/21/24 2052  GLUCAP 148* 170* 190* 113* 157*   Lipid Profile: Recent Labs    09/19/24 0758  CHOL 81  HDL 30*  LDLCALC 36  TRIG 73  CHOLHDL 2.7   Thyroid  Function Tests: No results for input(s): TSH, T4TOTAL, FREET4, T3FREE, THYROIDAB in the last 72 hours. Anemia Panel: No results for input(s): VITAMINB12, FOLATE, FERRITIN, TIBC, IRON, RETICCTPCT in the last 72 hours. Sepsis Labs: No results for input(s): PROCALCITON, LATICACIDVEN in the last 168 hours.  Recent Results (from the past 240 hours)  Surgical PCR screen     Status: Abnormal   Collection Time: 09/15/24  5:55 PM   Specimen: Nasal Mucosa; Nasal Swab  Result Value Ref Range Status   MRSA, PCR NEGATIVE NEGATIVE Final   Staphylococcus aureus POSITIVE (A) NEGATIVE Final    Comment: (NOTE) The Xpert SA Assay (FDA approved for NASAL specimens in patients 24 years of age and older), is one component of a comprehensive surveillance program. It is not intended to diagnose infection nor to guide or monitor treatment. Performed at [facility], [address], [place], [place] [address]     Radiology Studies: No results found.  Scheduled Meds:  sodium chloride    Intravenous Once   amitriptyline   75 mg Oral QHS   atorvastatin   80 mg Oral QPM   Chlorhexidine  Gluconate Cloth  6 each Topical QHS   cyanocobalamin   5,000 mcg Oral Daily   famotidine   40 mg Oral QHS   feeding supplement  1 Container Oral TID BM   insulin  aspart  0-9 Units Subcutaneous TID WC   nystatin    Topical TID   nystatin  cream  1 Application Topical BID   potassium chloride   40 mEq Oral BID   sodium chloride  flush  10-40 mL Intracatheter Q12H   Continuous Infusions:   LOS: 6 days   Alejandro Marker, DO [facility] Available via Epic secure chat  7am-7pm After these hours, please refer to coverage provider listed on [email or web address] 09/21/2024, 9:16 PM  "

## 2024-09-21 NOTE — Progress Notes (Signed)
 Report called. Patient stable at time of discharge

## 2024-09-21 NOTE — Progress Notes (Signed)
 [facility]astroenterology Progress Note  Mary Ferguson 74 y.o. February 17, 1951  CC:  GI bleed   Subjective: Patient seen and examined at bedside.  She had another episode of rectal bleeding with blood clots and small amount of bright red blood.  Denies any abdominal pain.  ROS : Afebrile, negative for chest pain   Objective: Vital signs in last 24 hours: Vitals:   09/21/24 0600 09/21/24 0800  BP:    Pulse: 80   Resp: 19   Temp:  97.8 F (36.6 C)  SpO2: 94%     Physical Exam: Sitting comfortably, not in acute distress, abdominal exam benign.  Mood and affect normal.  Alert and oriented x 3.  Lab Results: Recent Labs    09/20/24 0242 09/21/24 0317  NA 141 141  K 3.6 3.3*  CL 107 107  CO2 23 25  GLUCOSE 124* 142*  BUN 9 8  CREATININE 0.91 0.98  CALCIUM  10.2 10.5*  MG 2.0 2.0  PHOS 3.2 4.1   Recent Labs    09/20/24 0242 09/21/24 0317  AST 20 17  ALT 10 8  ALKPHOS 73 76  BILITOT 0.9 0.6  PROT 5.9* 5.5*  ALBUMIN  3.4* 3.4*   Recent Labs    09/20/24 0242 09/21/24 0317  WBC 6.0 6.2  NEUTROABS 3.5 3.2  HGB 12.3 11.7*  HCT 38.4 36.8  MCV 86.5 87.2  PLT 183 212   No results for input(s): LABPROT, INR in the last 72 hours.    Assessment/Plan: - Acute diverticular bleed status post colonoscopy on September 17, 2024 with finding concerning for active bleeding from ascending colon diverticulum status post Hemoclip placement.   - Acute blood loss anemia.  Hemoglobin was down to 7.8 few days ago.  Status post blood transfusion.  Hemoglobin normal yesterday as well as today. -History of DVT.  Xarelto  on hold. - History of coronary artery disease - History of COPD and other chronic conditions  Recommendations -------------------------- - Case discussed with hospitalist.  She had another episode of rectal bleeding with blood clots and small amount of bright red blood per rectum.  Her hemoglobin is stable.  I believe would be reasonable to start her on a low-dose  Xarelto  tomorrow and observe 24 hours in hospital before putting her full dose Xarelto . - Okay to advance diet as tolerated  - Repeat labs in the morning - GI will follow    [name] MD, FACP 09/21/2024, 10:39 AM  Contact #  [phone]

## 2024-09-21 NOTE — Anesthesia Postprocedure Evaluation (Signed)
"   Anesthesia Post Note  Patient: Mary Ferguson  Procedure(s) Performed: COLONOSCOPY CONTROL OF HEMORRHAGE, GI TRACT, ENDOSCOPIC, BY CLIPPING OR OVERSEWING     Patient location during evaluation: PACU Anesthesia Type: MAC Level of consciousness: awake and alert Pain management: pain level controlled Vital Signs Assessment: post-procedure vital signs reviewed and stable Respiratory status: spontaneous breathing, nonlabored ventilation, respiratory function stable and patient connected to nasal cannula oxygen Cardiovascular status: stable and blood pressure returned to baseline Postop Assessment: no apparent nausea or vomiting Anesthetic complications: no   No notable events documented.  Last Vitals:  Vitals:   09/21/24 0500 09/21/24 0600  BP:    Pulse: 81 80  Resp: 17 19  Temp:    SpO2: 93% 94%    Last Pain:  Vitals:   09/21/24 0427  TempSrc: Axillary  PainSc:                  Lynwood MARLA Cornea      "

## 2024-09-21 NOTE — Consult Note (Signed)
" °  CLINICAL SUPPORT TEAM - WOUND OSTOMY AND CONTINENCE TEAM  CONSULTATION SERVICES   WOC Nurse-Inpatient Note  WOC Nurse Consult Note: Reason for Consult: intertrigo  Wound type: moisture skin breakdown Skin folds inflammation, to various sites of the body that may include groin, pannus, breast Pressure Injury POA: No Wound bed: erythema  Drainage see flowsheet Periwound: moist Coordinated care with primary nurse via secure chat Dressing procedure/placement/frequency:  Order Gerlean # [phone] Measure and cut length of InterDry to fit in skin folds that have skin breakdown Tuck InterDry fabric into skin folds in a single layer, allow for 2 inches of overhang from skin edges to allow for wicking to occur May remove to bathe; dry area thoroughly and then tuck into affected areas again Do not apply any creams or ointments when using InterDry DO NOT THROW AWAY FOR 5 DAYS unless soiled with stool DO NOT [facility] product, this will inactivate the silver in the material  New sheet of Interdry should be applied after 5 days of use if patient continues to have skin breakdown    WOC will not follow and will remove patient from census task list. Please reconsult if wound worsens in condition and notify provider.   Sherrilyn Hals MSN RN CWOCN WOC [facility]  [phone] (Available from 7-3 pm Mon-Friday)     "

## 2024-09-21 NOTE — Plan of Care (Signed)
" °  Problem: Coping: Goal: Level of anxiety will decrease Outcome: Progressing   Problem: Elimination: Goal: Will not experience complications related to bowel motility Outcome: Progressing   Problem: Pain Managment: Goal: General experience of comfort will improve and/or be controlled Outcome: Progressing   Problem: Safety: Goal: Ability to remain free from injury will improve Outcome: Progressing   Problem: Coping: Goal: Ability to adjust to condition or change in health will improve Outcome: Progressing   "

## 2024-09-22 ENCOUNTER — Inpatient Hospital Stay (HOSPITAL_COMMUNITY)

## 2024-09-22 DIAGNOSIS — K922 Gastrointestinal hemorrhage, unspecified: Secondary | ICD-10-CM | POA: Diagnosis not present

## 2024-09-22 LAB — GLUCOSE, CAPILLARY
Glucose-Capillary: 140 mg/dL — ABNORMAL HIGH (ref 70–99)
Glucose-Capillary: 166 mg/dL — ABNORMAL HIGH (ref 70–99)
Glucose-Capillary: 153 mg/dL — ABNORMAL HIGH (ref 70–99)
Glucose-Capillary: 174 mg/dL — ABNORMAL HIGH (ref 70–99)

## 2024-09-22 LAB — CBC WITH DIFFERENTIAL/PLATELET
Abs Immature Granulocytes: 0.05 10*3/uL (ref 0.00–0.07)
Basophils Absolute: 0 10*3/uL (ref 0.0–0.1)
Basophils Relative: 1 %
Eosinophils Absolute: 0.5 10*3/uL (ref 0.0–0.5)
Eosinophils Relative: 7 %
HCT: 35.6 % — ABNORMAL LOW (ref 36.0–46.0)
Hemoglobin: 11.1 g/dL — ABNORMAL LOW (ref 12.0–15.0)
Immature Granulocytes: 1 %
Lymphocytes Relative: 20 %
Lymphs Abs: 1.3 10*3/uL (ref 0.7–4.0)
MCH: 27.9 pg (ref 26.0–34.0)
MCHC: 31.2 g/dL (ref 30.0–36.0)
MCV: 89.4 fL (ref 80.0–100.0)
Monocytes Absolute: 0.6 10*3/uL (ref 0.1–1.0)
Monocytes Relative: 9 %
Neutro Abs: 4.1 10*3/uL (ref 1.7–7.7)
Neutrophils Relative %: 62 %
Platelets: 240 10*3/uL (ref 150–400)
RBC: 3.98 MIL/uL (ref 3.87–5.11)
RDW: 16.5 % — ABNORMAL HIGH (ref 11.5–15.5)
WBC: 6.6 10*3/uL (ref 4.0–10.5)
nRBC: 0 % (ref 0.0–0.2)

## 2024-09-22 LAB — MAGNESIUM: Magnesium: 1.9 mg/dL (ref 1.7–2.4)

## 2024-09-22 LAB — COMPREHENSIVE METABOLIC PANEL WITH GFR
ALT: 11 U/L (ref 0–44)
AST: 19 U/L (ref 15–41)
Albumin: 3.3 g/dL — ABNORMAL LOW (ref 3.5–5.0)
Alkaline Phosphatase: 83 U/L (ref 38–126)
Anion gap: 9 (ref 5–15)
BUN: 10 mg/dL (ref 8–23)
CO2: 23 mmol/L (ref 22–32)
Calcium: 10.1 mg/dL (ref 8.9–10.3)
Chloride: 109 mmol/L (ref 98–111)
Creatinine, Ser: 1.08 mg/dL — ABNORMAL HIGH (ref 0.44–1.00)
GFR, Estimated: 54 mL/min — ABNORMAL LOW
Glucose, Bld: 159 mg/dL — ABNORMAL HIGH (ref 70–99)
Potassium: 4.4 mmol/L (ref 3.5–5.1)
Sodium: 141 mmol/L (ref 135–145)
Total Bilirubin: 0.5 mg/dL (ref 0.0–1.2)
Total Protein: 5.6 g/dL — ABNORMAL LOW (ref 6.5–8.1)

## 2024-09-22 LAB — PHOSPHORUS: Phosphorus: 2.9 mg/dL (ref 2.5–4.6)

## 2024-09-22 MED ORDER — TECHNETIUM TC 99M-LABELED RED BLOOD CELLS IV KIT
22.5000 | PACK | Freq: Once | INTRAVENOUS | Status: AC
  Administered 2024-09-22: 22.5 via INTRAVENOUS

## 2024-09-22 NOTE — TOC Progression Note (Signed)
 Transition of Care [facility]) - Progression Note    Patient Details  Name: Mary Ferguson MRN: [id number] Date of Birth: 27-Jan-1951  [facility] CM/SW Contact  Tawni CHRISTELLA Eva, LCSW Phone Number: 09/22/2024, 2:16 PM  Clinical Narrative:     CSW attempted to speak with pt to discuss recs for SNF placement. Pt at procedure, will follow up. ICM to follow.     Expected Discharge Plan: Home/Self Care Barriers to Discharge: Continued Medical Work up               Expected Discharge Plan and Services   Discharge Planning Services: CM Consult   Living arrangements for the past 2 months: Single Family Home                 DME Arranged: N/A DME Agency: NA       HH Arranged: NA HH Agency: NA         Social Drivers of Health (SDOH) Interventions SDOH Screenings   Food Insecurity: No Food Insecurity (09/19/2024)  Housing: Low Risk (09/19/2024)  Transportation Needs: No Transportation Needs (09/19/2024)  Utilities: Not At Risk (09/19/2024)  Alcohol Screen: Low Risk (05/26/2023)  Depression (PHQ2-9): Low Risk (07/06/2024)  Financial Resource Strain: Low Risk (05/26/2023)  Physical Activity: Inactive (07/06/2024)  Social Connections: Socially Isolated (09/15/2024)  Stress: No Stress Concern Present (07/06/2024)  Tobacco Use: Medium Risk (09/17/2024)  Health Literacy: Adequate Health Literacy (07/06/2024)    Readmission Risk Interventions    09/19/2024    2:17 PM  Readmission Risk Prevention Plan  Transportation Screening Complete  PCP or Specialist Appt within 5-7 Days Complete  Home Care Screening Complete  Medication Review (RN CM) Complete

## 2024-09-22 NOTE — Progress Notes (Addendum)
 " [facility] Progress Note  Patient: Mary Ferguson     FMW:[id number]  DOA: 09/15/2024   PCP: Catherine Charlies LABOR, DO       Brief hospital course: 74 year old female with asthma/COPD and chronic respiratory failure on 3 L of oxygen at baseline, diabetes mellitus type 2, coronary artery disease with history of PCI in relation to an MI, hypertension, recurrent DVTs, CKD, obesity, GERD, depression and anxiety presents to the hospital for abdominal cramping, dizziness and rectal bleeding. 09/16/2024: Flex sig-poor colon prep, blood in the rectum and the sigmoid colon, descending colon, mid descending colon and distal descending colon, diverticulosis in the rectosigmoid colon, sigmoid colon and descending colon 09/17/2024: Colonoscopy-diverticulosis, evidence of recent bleeding from the diverticular opening-clips were placed 09/18/24: Strokelike symptoms with left facial droop left-sided weakness-workup negative for CVA-clear liquids started after SLP eval 09/19/2024:  bloody bowel movements including clotted blood with intermittent dizziness 09/21/2024: Advance to full liquids  Subjective:  Had bloody bowel movements again today without any stool.  Noted clots today and blood when wiping.  Feeling dizzy when she is standing.  Having her vertigo when she tries to raise up from laying to sitting position.  Assessment and Plan: Principal Problem:   Acute lower GI bleeding/acute blood loss anemia - Persistent bleeding - Continue to hold Eliquis and follow - Bleeding scan today was negative - Hemoglobin has not dropped from yesterday - Continue to transfuse as needed-she has received FFP and PRBCs  Active Problems:  Neurological abnormality - Left facial droop left-sided weakness - Code stroke called - MRI negative for acute abnormality, did show chronic small vessel disease - 2D echo revealed EF of 60 to 65%, grade 1 diastolic dysfunction-no thrombus - PT/OT/speech eval-SNF  recommended-continue regular diet - Lipid panel-HDL 30, LDL 36 - A1c-6.0  Intertrigo - Continue wound care and nystatin  powder  Hypokalemia - Replace and follow  AKI-CKD stage IIIa, metabolic acidosis - Creatinine up to 1.08 today-follow   Obesity class III Body mass index is 45.91 kg/m.  Thrombocytopenia - Platelets in the 140s-has resolved  History of DVT/PE - Eliquis being held  Glucose intolerance - Continue 3 times daily NovoLog  based on sliding scale      Code Status: Full Code Total time on patient care: 35 minutes DVT prophylaxis:  SCDs Start: 09/15/24 1131     Objective:   Vitals:   09/21/24 1528 09/21/24 1911 09/22/24 0602 09/22/24 1558  BP: (!) 129/91 138/64 129/66 131/76  Pulse: (!) 108 80 85 87  Resp: 16 20 16    Temp: 98 F (36.7 C) 98.2 F (36.8 C) 98.4 F (36.9 C) (!) 97.2 F (36.2 C)  TempSrc:  Oral Oral Oral  SpO2: 100%   91%  Weight:      Height:       Filed Weights   09/15/24 0806 09/16/24 1223 09/17/24 1327  Weight: 116 kg 116 kg 113.9 kg   Exam: General exam: Appears comfortable  HEENT: oral mucosa moist Respiratory system: Clear to auscultation.  Cardiovascular system: S1 & S2 heard  Gastrointestinal system: Abdomen soft, non-tender, nondistended. Normal bowel sounds   Extremities: No cyanosis, clubbing or edema Psychiatry:  Mood & affect appropriate.      CBC: Recent Labs  Lab 09/19/24 0758 09/19/24 1614 09/20/24 0242 09/21/24 0317 09/22/24 1015  WBC 5.8 7.5 6.0 6.2 6.6  NEUTROABS 3.6 4.8 3.5 3.2 4.1  HGB 11.6* 12.4 12.3 11.7* 11.1*  HCT 35.2* 37.1 38.4 36.8 35.6*  MCV 85.2 84.1  86.5 87.2 89.4  PLT 144* 140* 183 212 240   Basic Metabolic Panel: Recent Labs  Lab 09/18/24 0255 09/19/24 0758 09/20/24 0242 09/21/24 0317 09/22/24 1015  NA 142 140 141 141 141  K 3.7 4.2 3.6 3.3* 4.4  CL 113* 109 107 107 109  CO2 23 20* 23 25 23   GLUCOSE 124* 90 124* 142* 159*  BUN 11 8 9 8 10   CREATININE 0.88 0.91 0.91  0.98 1.08*  CALCIUM  8.7* 9.3 10.2 10.5* 10.1  MG 1.9 2.0 2.0 2.0 1.9  PHOS 3.1 3.0 3.2 4.1 2.9     Scheduled Meds:  sodium chloride    Intravenous Once   amitriptyline   75 mg Oral QHS   atorvastatin   80 mg Oral QPM   Chlorhexidine  Gluconate Cloth  6 each Topical QHS   cyanocobalamin   5,000 mcg Oral Daily   famotidine   40 mg Oral QHS   feeding supplement  1 Container Oral TID BM   insulin  aspart  0-9 Units Subcutaneous TID WC   nystatin    Topical TID   nystatin  cream  1 Application Topical BID   sodium chloride  flush  10-40 mL Intracatheter Q12H    Imaging and lab data personally reviewed   Author: [employer]  09/22/2024 6:23 PM  To contact [facility]>   Check the care team in [facility] and look for the attending/consulting [facility] provider listed  Log into [email or web address] and use [place]'s universal password   Go to> [facility]  and find provider  If you still have difficulty reaching the provider, please page the [facility] (Director on Call) for the Hospitalists listed on amion     "

## 2024-09-22 NOTE — Progress Notes (Addendum)
 Physical Therapy Treatment Patient Details Name: Mary Ferguson MRN: [id number] DOB: June 26, 1951 Today's Date: 09/22/2024   History of Present Illness The is a 74 y.o. female who presented to the emergency department 09/15/24 with complaints of rectal bleeding associated with generalized abdominal cramping and postural dizziness.Flex Sig and she had old blood and diverticulosis but no active signs of bleeding PMH: abdominal wall hernia, PTSD, depression, varicella-zoster, rhabdomyolysis, urinary incontinence, diverticulosis/diverticulitis, asthma/COPD, chronic respiratory failure on  oxygen at 3 LPM, type II DM, CAD, history of MI, status post PCI, hyperlipidemia, hypertension, recurrent DVTs on chronic anticoagulation, class III obesity, stage IIIa CKD, depression, GERD    PT Comments  Spoke with RN outside of room-ok to see patient-pt in bathroom upon my arrival. Pt agreeable to session. Pt was able to rise from commode and wash hands at sink unassisted. She requested to walk a bit,around the room, before returning to bed. O2: 97% on RA, HR 120 bpm with activity. She wishes to work with therapy as much as she can while here. Discussed d/c plan-she plans to return home. She declines HHPT f/u. She is more amenable to OPPT. She could potentially benefit from ST rehab if she is agreeable.    If plan is discharge home, recommend the following: A little help with walking and/or transfers;A little help with bathing/dressing/bathroom;Help with stairs or ramp for entrance;Assistance with cooking/housework;Assist for transportation   Can travel by private vehicle     Yes  Equipment Recommendations  None recommended by PT    Recommendations for Other Services       Precautions / Restrictions Precautions Precautions: Fall Recall of Precautions/Restrictions: Impaired Precaution/Restrictions Comments: reports passes out  and falls Restrictions Weight Bearing Restrictions Per Provider Order: No      Mobility  Bed Mobility Overal bed mobility: Modified Independent             General bed mobility comments: sit to supine-no assist    Transfers Overall transfer level: Needs assistance Equipment used: Rolling walker (2 wheels) Transfers: Sit to/from Stand Sit to Stand: Contact guard assist           General transfer comment: Cues for safety hand placement    Ambulation/Gait Ambulation/Gait assistance: Contact guard assist Gait Distance (Feet): 30 Feet (12'x1; 30'x1) Assistive device: Rolling walker (2 wheels) Gait Pattern/deviations: Step-through pattern       General Gait Details: gait slow and steady. no overt LOB. pt reported some lightheadedness. tolerated short distance well-she declines hallway ambulation-prefers to remain in room-wanted to walk   Stairs             Wheelchair Mobility     Tilt Bed    Modified Rankin (Stroke Patients Only)       Balance Overall balance assessment: Needs assistance, History of Falls         Standing balance support: During functional activity, Reliant on assistive device for balance, Bilateral upper extremity supported Standing balance-Leahy Scale: Fair                              Hotel Manager: No apparent difficulties  Cognition Arousal: Alert Behavior During Therapy: WFL for tasks assessed/performed   PT - Cognitive impairments: No apparent impairments                         Following commands: Intact      Cueing Cueing Techniques: Verbal cues  Exercises  STS, 5 reps    General Comments        Pertinent Vitals/Pain Pain Assessment Pain Assessment: Faces Faces Pain Scale: No hurt Pain Intervention(s): Limited activity within patient's tolerance, Monitored during session    Home Living                          Prior Function            PT Goals (current goals can now be found in the care plan section) Progress  towards PT goals: Progressing toward goals    Frequency    Min 3X/week      PT Plan      Co-evaluation              AM-PAC PT 6 Clicks Mobility   Outcome Measure  Help needed turning from your back to your side while in a flat bed without using bedrails?: None Help needed moving from lying on your back to sitting on the side of a flat bed without using bedrails?: None Help needed moving to and from a bed to a chair (including a wheelchair)?: A Little Help needed standing up from a chair using your arms (e.g., wheelchair or bedside chair)?: A Little Help needed to walk in hospital room?: A Little Help needed climbing 3-5 steps with a railing? : A Little 6 Click Score: 20    End of Session Equipment Utilized During Treatment: Gait belt Activity Tolerance: Patient tolerated treatment well Patient left: in bed;with call bell/phone within reach;with bed alarm set   PT Visit Diagnosis: Unsteadiness on feet (R26.81);History of falling (Z91.81);Pain;Difficulty in walking, not elsewhere classified (R26.2)     Time: 1122-1140 PT Time Calculation (min) (ACUTE ONLY): 18 min  Charges:    $Gait Training: 8-22 mins PT General Charges $$ ACUTE PT VISIT: 1 Visit                         Dannial SQUIBB, PT Acute Rehabilitation  Office: [phone]

## 2024-09-22 NOTE — Progress Notes (Signed)
 SLP Cancellation Note  Patient Details Name: Mary Ferguson MRN: [id number] DOB: [birth year]   Cancelled treatment:       Reason Eval/Treat Not Completed: Patient at procedure or test/unavailable. Pt not in room. Will f/u   Jala Dundon, Consuelo Fitch 09/22/2024, 2:28 PM

## 2024-09-22 NOTE — Progress Notes (Signed)
 [name] Gastroenterology Progress Note  Mary Ferguson 74 y.o. December 08, 1950  CC:  GI bleed   Subjective: Patient seen and examined at bedside.  She is transferred out from ICU.  She continues to have slow bleeding with blood clots and trace of blood in the stool.  ROS : Afebrile, negative for chest pain   Objective: Vital signs in last 24 hours: Vitals:   09/21/24 1911 09/22/24 0602  BP: 138/64 129/66  Pulse: 80 85  Resp: 20 16  Temp: 98.2 F (36.8 C) 98.4 F (36.9 C)  SpO2:      Physical Exam: Sitting comfortably, not in acute distress, abdominal exam benign.  Mood and affect normal.  Alert and oriented x 3.  Lab Results: Recent Labs    09/20/24 0242 09/21/24 0317  NA 141 141  K 3.6 3.3*  CL 107 107  CO2 23 25  GLUCOSE 124* 142*  BUN 9 8  CREATININE 0.91 0.98  CALCIUM  10.2 10.5*  MG 2.0 2.0  PHOS 3.2 4.1   Recent Labs    09/20/24 0242 09/21/24 0317  AST 20 17  ALT 10 8  ALKPHOS 73 76  BILITOT 0.9 0.6  PROT 5.9* 5.5*  ALBUMIN  3.4* 3.4*   Recent Labs    09/20/24 0242 09/21/24 0317  WBC 6.0 6.2  NEUTROABS 3.5 3.2  HGB 12.3 11.7*  HCT 38.4 36.8  MCV 86.5 87.2  PLT 183 212   No results for input(s): LABPROT, INR in the last 72 hours.    Assessment/Plan: - Acute diverticular bleed status post colonoscopy on September 17, 2024 with finding concerning for active bleeding from ascending colon diverticulum status post Hemoclip placement.   - Acute blood loss anemia.  Hemoglobin was down to 7.8 few days ago.  Status post blood transfusion.  Hemoglobin normal yesterday as well as today. -History of DVT.  Xarelto  on hold. - History of coronary artery disease - History of COPD and other chronic conditions  Recommendations -------------------------- - Patient appears to be having ongoing slow bleeding.  Recommend GI bleeding scan.  Hold off on starting Xarelto  today.  Repeat CBC this morning.  GI will follow.   [name] MD, FACP 09/22/2024, 9:18  AM  Contact #  [phone]

## 2024-09-22 NOTE — Plan of Care (Signed)

## 2024-09-23 DIAGNOSIS — K922 Gastrointestinal hemorrhage, unspecified: Secondary | ICD-10-CM | POA: Diagnosis not present

## 2024-09-23 LAB — CBC
HCT: 38.8 % (ref 36.0–46.0)
Hemoglobin: 12.5 g/dL (ref 12.0–15.0)
MCH: 28.1 pg (ref 26.0–34.0)
MCHC: 32.2 g/dL (ref 30.0–36.0)
MCV: 87.2 fL (ref 80.0–100.0)
Platelets: 296 10*3/uL (ref 150–400)
RBC: 4.45 MIL/uL (ref 3.87–5.11)
RDW: 16.1 % — ABNORMAL HIGH (ref 11.5–15.5)
WBC: 6.4 10*3/uL (ref 4.0–10.5)
nRBC: 0 % (ref 0.0–0.2)

## 2024-09-23 LAB — BASIC METABOLIC PANEL WITH GFR
Anion gap: 12 (ref 5–15)
BUN: 11 mg/dL (ref 8–23)
CO2: 22 mmol/L (ref 22–32)
Calcium: 10.4 mg/dL — ABNORMAL HIGH (ref 8.9–10.3)
Chloride: 109 mmol/L (ref 98–111)
Creatinine, Ser: 1.01 mg/dL — ABNORMAL HIGH (ref 0.44–1.00)
GFR, Estimated: 59 mL/min — ABNORMAL LOW
Glucose, Bld: 171 mg/dL — ABNORMAL HIGH (ref 70–99)
Potassium: 4.3 mmol/L (ref 3.5–5.1)
Sodium: 142 mmol/L (ref 135–145)

## 2024-09-23 LAB — GLUCOSE, CAPILLARY
Glucose-Capillary: 136 mg/dL — ABNORMAL HIGH (ref 70–99)
Glucose-Capillary: 135 mg/dL — ABNORMAL HIGH (ref 70–99)
Glucose-Capillary: 151 mg/dL — ABNORMAL HIGH (ref 70–99)
Glucose-Capillary: 144 mg/dL — ABNORMAL HIGH (ref 70–99)

## 2024-09-23 MED ORDER — HEPARIN (PORCINE) 25000 UT/250ML-% IV SOLN
1000.0000 [IU]/h | INTRAVENOUS | Status: DC
  Administered 2024-09-23 – 2024-09-24 (×2): 1200 [IU]/h via INTRAVENOUS
  Filled 2024-09-23 (×2): qty 250

## 2024-09-23 MED ORDER — MAGNESIUM OXIDE -MG SUPPLEMENT 400 (240 MG) MG PO TABS
400.0000 mg | ORAL_TABLET | Freq: Two times a day (BID) | ORAL | Status: DC
  Administered 2024-09-23 – 2024-09-27 (×9): 400 mg via ORAL
  Filled 2024-09-23 (×9): qty 1

## 2024-09-23 MED ORDER — HYDROCORTISONE ACETATE 25 MG RE SUPP
25.0000 mg | Freq: Two times a day (BID) | RECTAL | Status: DC
  Administered 2024-09-23 – 2024-09-27 (×7): 25 mg via RECTAL
  Filled 2024-09-23 (×12): qty 1

## 2024-09-23 NOTE — Progress Notes (Signed)
 [facility] Gastroenterology Progress Note  Mary Ferguson 74 y.o. 10-Sep-1950  CC:  GI bleed   Subjective: Patient seen and examined at bedside.  Had 1 bowel movement this morning with small amount of blood mixed with the stool.  ROS : Afebrile, negative for chest pain   Objective: Vital signs in last 24 hours: Vitals:   09/22/24 2045 09/23/24 0406  BP: 134/61 (!) 139/58  Pulse: 84 79  Resp: 14 14  Temp: 97.9 F (36.6 C) 98.1 F (36.7 C)  SpO2: 95% 96%    Physical Exam: Sitting comfortably, not in acute distress, abdominal exam benign.  Mood and affect normal.  Alert and oriented x 3.  Lab Results: Recent Labs    09/21/24 0317 09/22/24 1015 09/23/24 0535  NA 141 141 142  K 3.3* 4.4 4.3  CL 107 109 109  CO2 25 23 22   GLUCOSE 142* 159* 171*  BUN 8 10 11   CREATININE 0.98 1.08* 1.01*  CALCIUM  10.5* 10.1 10.4*  MG 2.0 1.9  --   PHOS 4.1 2.9  --    Recent Labs    09/21/24 0317 09/22/24 1015  AST 17 19  ALT 8 11  ALKPHOS 76 83  BILITOT 0.6 0.5  PROT 5.5* 5.6*  ALBUMIN  3.4* 3.3*   Recent Labs    09/21/24 0317 09/22/24 1015  WBC 6.2 6.6  NEUTROABS 3.2 4.1  HGB 11.7* 11.1*  HCT 36.8 35.6*  MCV 87.2 89.4  PLT 212 240   No results for input(s): LABPROT, INR in the last 72 hours.    Assessment/Plan: - Acute diverticular bleed status post colonoscopy on September 17, 2024 with finding concerning for active bleeding from ascending colon diverticulum status post Hemoclip placement.  CT angio GI bleeding scan negativeon January 22.  Bleeding scan negative January 28. - Acute blood loss anemia.  Hemoglobin was down to 7.8 few days ago.  Status post blood transfusion.  Hemoglobin stable. -History of DVT.  Xarelto  on hold. - History of coronary artery disease - History of COPD and other chronic conditions  Recommendations -------------------------- - GI bleeding scan negative yesterday.  Had a bowel movement today with trace amount of blood mixed with the stool. -  Check CBC.  If hemoglobin stable recommend starting heparin  drip for 24 hours before putting her on oral anticoagulation - Start Anusol  suppository - Patient wants to have regular diet.  Diet advanced to heart healthy - Repeat labs in the morning.  GI will follow.   [name] MD, FACP 09/23/2024, 10:30 AM  Contact #  [phone]

## 2024-09-23 NOTE — NC FL2 (Signed)
 " [place]  MEDICAID FL2 LEVEL OF CARE FORM     IDENTIFICATION  Patient Name: Mary Ferguson Birthdate: 05-13-1951 Sex: female Admission Date (Current Location): 09/15/2024  [facility] and [place] Number:  Producer, Television/film/video and Address:  [facility],  [address], [place] [address]      Provider Number: [id number]  Attending Physician Name and Address:  Earley Saucer, MD  Relative Name and Phone Number:  Lang Schneider (son) [phone]    Current Level of Care: Hospital Recommended Level of Care: Skilled Nursing Facility Prior Approval Number:    Date Approved/Denied:   PASRR Number: [id number]  Discharge Plan: SNF    Current Diagnoses: Patient Active Problem List   Diagnosis Date Noted   Acute lower GI bleeding 09/15/2024   CAP (community acquired pneumonia) 09/15/2024   Acute blood loss anemia (ABLA) 09/15/2024   Chronic venous insufficiency 01/08/2022   Irritable bowel syndrome with diarrhea 06/21/2021   Imdur  therapy-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) 03/22/2021   Lumbar radiculopathy 03/22/2021   Neuropathy 03/22/2021   Aortic atherosclerosis 03/22/2021   Type II diabetes mellitus with manifestations (HCC) 03/21/2021   COPD (chronic obstructive pulmonary disease) (HCC) 03/21/2021   CAD S/P PCI 06/30/2019   Dyslipidemia, goal LDL below 70 06/30/2019   Morbid obesity (HCC)    Essential hypertension    History of non-ST elevation myocardial infarction (NSTEMI) 06/17/2019   Mild episode of recurrent major depressive disorder 03/25/2017   CKD (chronic kidney disease) stage 3, GFR 30-59 ml/min (HCC) 03/19/2017   Vitamin D  deficiency 03/19/2017   B12 deficiency 03/19/2017   Nocturnal oxygen desaturation 12/11/2016   BMI 45.0-49.9, adult (HCC) 12/11/2016   Ambulates with cane 12/11/2016   Chronic pruritic rash in adult 07/08/2016   Chronic pain syndrome 03/13/2015   Obstructive sleep apnea (adult) (pediatric) 08/05/2013    Xarelto -Acquired thrombophilia (HCC) 02/18/2013   Moderate persistent asthma 12/30/2012   History of recurrent deep vein thrombosis (DVT) 12/30/2012    Orientation RESPIRATION BLADDER Height & Weight     Self, Time, Situation, Place  Normal Continent Weight: 113.9 kg Height:  5' 2 (157.5 cm)  BEHAVIORAL SYMPTOMS/MOOD NEUROLOGICAL BOWEL NUTRITION STATUS      Continent Diet (heart healthy, carb modified)  AMBULATORY STATUS COMMUNICATION OF NEEDS Skin   Limited Assist Verbally Other (Comment), Bruising (ecchymosis bilateral arms; erythema/redness abdomen and bilateral breasts)                       Personal Care Assistance Level of Assistance  Bathing, Feeding, Dressing Bathing Assistance: Limited assistance Feeding assistance: Independent Dressing Assistance: Limited assistance     Functional Limitations Info  Sight, Hearing, Speech Sight Info: Impaired (reading glasses) Hearing Info: Impaired Speech Info: Adequate    SPECIAL CARE FACTORS FREQUENCY  PT (By licensed PT), OT (By licensed OT)     PT Frequency: 5x/week OT Frequency: 5x/week            Contractures Contractures Info: Not present    Additional Factors Info  Code Status, Allergies, Insulin  Sliding Scale Code Status Info: Full Code Allergies Info: Rofecoxib, Metformin  And Related, Sulfonylureas, Benadryl Allergy (Diphenhydramine Hcl), Cephalexin, Gatifloxacin   Insulin  Sliding Scale Info: See MAR       Current Medications (09/23/2024):  This is the current hospital active medication list Current Facility-Administered Medications  Medication Dose Route Frequency Provider Last Rate Last Admin   0.9 %  sodium chloride  infusion (Manually program via Guardrails IV Fluids)  Intravenous Once Kriss Estefana DEL, DO   Stopped at 09/17/24 8177   acetaminophen  (TYLENOL ) tablet 650 mg  650 mg Oral Q6H PRN Kriss Estefana DEL, DO   650 mg at 09/21/24 8467   Or   acetaminophen  (TYLENOL ) suppository 650 mg  650  mg Rectal Q6H PRN Kriss Estefana DEL, DO       albuterol  (PROVENTIL ) (2.5 MG/3ML) 0.083% nebulizer solution 2.5 mg  2.5 mg Inhalation Q4H PRN Kriss Estefana DEL, DO       amitriptyline  (ELAVIL ) tablet 75 mg  75 mg Oral QHS Kriss Estefana H, DO   75 mg at 09/22/24 2156   atorvastatin  (LIPITOR ) tablet 80 mg  80 mg Oral QPM Kriss Estefana DEL, DO   80 mg at 09/22/24 1729   camphor-menthol  (SARNA) lotion   Topical PRN Kriss Estefana DEL, DO   1 Application at 09/18/24 2117   Chlorhexidine  Gluconate Cloth 2 % PADS 6 each  6 each Topical QHS Sheikh, Omair [place], [place]   6 each at 09/21/24 0515   cyanocobalamin  (VITAMIN B12) tablet 5,000 mcg  5,000 mcg Oral Daily Kriss Estefana DEL, DO   5,000 mcg at 09/23/24 1014   dicyclomine  (BENTYL ) tablet 20 mg  20 mg Oral QID PRN Kriss Estefana H, DO   20 mg at 09/20/24 1003   famotidine  (PEPCID ) tablet 40 mg  40 mg Oral QHS Kriss Estefana H, DO   40 mg at 09/22/24 2155   feeding supplement (BOOST / RESOURCE BREEZE) liquid 1 Container  1 Container Oral TID BM Sherrill Cable [place], DO   1 Container at 09/23/24 1553   heparin  ADULT infusion 100 units/mL (25000 units/250mL)  1,200 Units/hr Intravenous Continuous Bethene Stefano POUR, RPH 12 mL/hr at 09/23/24 1623 1,200 Units/hr at 09/23/24 1623   hydrocortisone  (ANUSOL -HC) suppository 25 mg  25 mg Rectal BID Brahmbhatt, Parag, MD       hydrOXYzine  (ATARAX ) tablet 25 mg  25 mg Oral Q8H PRN Kriss Estefana H, DO   25 mg at 09/23/24 9257   insulin  aspart (novoLOG ) injection 0-9 Units  0-9 Units Subcutaneous TID WC Sheikh, Omair Latif, DO   1 Units at 09/23/24 1213   magnesium  oxide (MAG-OX) tablet 400 mg  400 mg Oral BID Rizwan, Saima, MD   400 mg at 09/23/24 1553   nystatin  (MYCOSTATIN /NYSTOP ) topical powder   Topical TID Sheikh, Omair Latif, DO   Given at 09/21/24 1716   nystatin  cream (MYCOSTATIN ) 1 Application  1 Application Topical BID Sheikh, Omair Latif, DO   1 Application at 09/21/24 9060   ondansetron  (ZOFRAN )  tablet 4 mg  4 mg Oral Q6H PRN Kriss Estefana DEL, DO       Or   ondansetron  (ZOFRAN ) injection 4 mg  4 mg Intravenous Q6H PRN Kriss Estefana H, DO   4 mg at 09/16/24 1620   sodium chloride  flush (NS) 0.9 % injection 10-40 mL  10-40 mL Intracatheter Q12H Kriss Estefana H, DO   10 mL at 09/23/24 1014   sodium chloride  flush (NS) 0.9 % injection 10-40 mL  10-40 mL Intracatheter PRN Kriss Estefana DEL, DO         Discharge Medications: Please see discharge summary for a list of discharge medications.  Relevant Imaging Results:  Relevant Lab Results:   Additional Information SSN [id number]  Sonda Manuella Quill, RN     "

## 2024-09-23 NOTE — TOC Progression Note (Signed)
 Transition of Care [facility]) - Progression Note    Patient Details  Name: Mary Ferguson MRN: [id number] Date of Birth: [birth year]  [facility]) CM/SW Contact  Sonda Manuella Quill, RN Phone Number: 09/23/2024, 5:41 PM  Clinical Narrative:    [place] w/ pt in room and son Lang Schneider ([phone]); they agree to recc SNF; explained SNF process; they verbalized understanding; pt requests bed search be limited to areas near [place], [place], and [place]; [facility] completed and sent for co-sign; faxed out for bed; awaiting offers.   Expected Discharge Plan: Home/Self Care Barriers to Discharge: Continued Medical Work up               Expected Discharge Plan and Services   Discharge Planning Services: CM Consult   Living arrangements for the past 2 months: Single Family Home                 DME Arranged: N/A DME Agency: NA       HH Arranged: NA HH Agency: NA         Social Drivers of Health (SDOH) Interventions SDOH Screenings   Food Insecurity: No Food Insecurity (09/19/2024)  Housing: Low Risk (09/19/2024)  Transportation Needs: No Transportation Needs (09/19/2024)  Utilities: Not At Risk (09/19/2024)  Alcohol Screen: Low Risk (05/26/2023)  Depression (PHQ2-9): Low Risk (07/06/2024)  Financial Resource Strain: Low Risk (05/26/2023)  Physical Activity: Inactive (07/06/2024)  Social Connections: Socially Isolated (09/15/2024)  Stress: No Stress Concern Present (07/06/2024)  Tobacco Use: Medium Risk (09/17/2024)  Health Literacy: Adequate Health Literacy (07/06/2024)    Readmission Risk Interventions    09/19/2024    2:17 PM  Readmission Risk Prevention Plan  Transportation Screening Complete  PCP or Specialist Appt within 5-7 Days Complete  Home Care Screening Complete  Medication Review (RN CM) Complete

## 2024-09-23 NOTE — TOC Progression Note (Signed)
 Transition of Care [facility]) - Progression Note    Patient Details  Name: Mary Ferguson MRN: [id number] Date of Birth: 1951-05-16  Transition of Care [facility]) CM/SW Contact  Sonda Manuella Quill, RN Phone Number: 09/23/2024, 3:45 PM  Clinical Narrative:    PT recc SNF; spoke w/ pt in room; she agreed to recc; explained SNF process; she verbalized understanding and would like bed search limited to facilities in [facility] area; she does not have facility preference.   Expected Discharge Plan: Home/Self Care Barriers to Discharge: Continued Medical Work up               Expected Discharge Plan and Services   Discharge Planning Services: CM Consult   Living arrangements for the past 2 months: Single Family Home                 DME Arranged: N/A DME Agency: NA       HH Arranged: NA HH Agency: NA         Social Drivers of Health (SDOH) Interventions SDOH Screenings   Food Insecurity: No Food Insecurity (09/19/2024)  Housing: Low Risk (09/19/2024)  Transportation Needs: No Transportation Needs (09/19/2024)  Utilities: Not At Risk (09/19/2024)  Alcohol Screen: Low Risk (05/26/2023)  Depression (PHQ2-9): Low Risk (07/06/2024)  Financial Resource Strain: Low Risk (05/26/2023)  Physical Activity: Inactive (07/06/2024)  Social Connections: Socially Isolated (09/15/2024)  Stress: No Stress Concern Present (07/06/2024)  Tobacco Use: Medium Risk (09/17/2024)  Health Literacy: Adequate Health Literacy (07/06/2024)    Readmission Risk Interventions    09/19/2024    2:17 PM  Readmission Risk Prevention Plan  Transportation Screening Complete  PCP or Specialist Appt within 5-7 Days Complete  Home Care Screening Complete  Medication Review (RN CM) Complete

## 2024-09-23 NOTE — Progress Notes (Signed)
 Occupational Therapy Treatment Patient Details Name: Mary Ferguson MRN: [id number] DOB: April 17, 1951 Today's Date: 09/23/2024   History of present illness The is a 74 yr old female who presented to the emergency department 09/15/24 with complaints of rectal bleeding associated with generalized abdominal cramping and postural dizziness.  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 She performed toileting management at bathroom level, upper and lower body bathing alternating between sitting and standing at the sink, hand and face washing standing at the sink, and upper and lower body dressing. She required CGA to min assist for tasks and was prompted to perform tasks in sitting as needed for energy conservation purposes, as she reported feeling of weakness and fatigue with progressive activity. She also reported having chronic back and BLE pain. With regards to upper body dressing, she donned a hospital gown in sitting at the sink, then implemented the figure technique to doff/donn socks. She put forth good effort and participation. Continue OT plan of care. Patient will benefit from continued inpatient follow up therapy, <3 hours/day.       If plan is discharge home, recommend the following:  A little help with walking and/or transfers;Assistance with cooking/housework;Direct supervision/assist for medications management;Direct supervision/assist for financial management;Assist for  transportation;Help with stairs or ramp for entrance;A little help with bathing/dressing/bathroom   Equipment Recommendations  None recommended by OT    Recommendations for Other Services      Precautions / Restrictions Precautions Precautions: Fall Restrictions Weight Bearing Restrictions Per Provider Order: No       Mobility Bed Mobility Overal bed mobility: Needs Assistance Bed Mobility: Supine to Sit     Supine to sit: Supervision          Transfers Overall transfer level: Needs assistance Equipment used: Rolling walker (2 wheels) Transfers: Sit to/from Stand Sit to Stand: Contact guard assist                 Balance     Sitting balance-Leahy Scale: Good       Standing balance-Leahy Scale: Fair            ADL either performed or assessed with clinical judgement   ADL Overall ADL's : Needs assistance/impaired     Grooming: Contact guard assist;Standing;Wash/dry hands;Wash/dry face   Upper Body Bathing: Supervision/ safety;Sitting;Set up;Standing   Lower Body Bathing: Contact guard assist;Sit to/from stand;Sitting/lateral leans   Upper Body Dressing : Minimal assistance;Sitting   Lower Body Dressing: Contact guard assist;Sitting/lateral leans   Toilet Transfer: Contact guard assist;Ambulation;Rolling walker (2 wheels);Grab bars;Regular Toilet   Toileting- Clothing Manipulation and Hygiene: Contact guard assist;Sit to/from stand         General ADL Comments: The pt was seen for ADL instruction and participation, consisting of performing toileting management at bathroom level, performing upper and lower body bathing alternating between sitting and standing at the sink, hand and face washing standing at the sink, and upper and lower body dressing. She required CGA to min assist for tasks and was prompted to perform tasks in sitting as needed for energy conservation purposes, as she reported feeling  of weakness and fatigue with progressive  activity. With regards to upper body dressing, she donned a hospital gown in sitting at the sink, then implemented the figure technique to doff/donn socks.     Vision Patient Visual Report: No change from baseline           Communication Communication Communication: No apparent difficulties   Cognition Arousal: Alert Behavior During Therapy: WFL for tasks assessed/performed          Following commands: Intact        Cueing   Cueing Techniques: Verbal cues  Exercises              Pertinent Vitals/ Pain       Pain Assessment Pain Assessment: 0-10 Pain Score: 7  Pain Location: chronic back and BLE Pain Descriptors / Indicators: Aching, Discomfort Pain Intervention(s): Limited activity within patient's tolerance, Monitored during session, Repositioned   Frequency  Min 2X/week        Progress Toward Goals  OT Goals(current goals can now be found in the care plan section)     Acute Rehab OT Goals OT Goal Formulation: With patient Time For Goal Achievement: 10/04/24 Potential to Achieve Goals: Good  Plan         AM-PAC OT 6 Clicks Daily Activity     Outcome Measure   Help from another person eating meals?: None Help from another person taking care of personal grooming?: A Little Help from another person toileting, which includes using toliet, bedpan, or urinal?: A Little Help from another person bathing (including washing, rinsing, drying)?: A Little Help from another person to put on and taking off regular upper body clothing?: A Little Help from another person to put on and taking off regular lower body clothing?: A Little 6 Click Score: 19    End of Session Equipment Utilized During Treatment: Rolling walker (2 wheels)  OT Visit Diagnosis: Unsteadiness on feet (R26.81);Other abnormalities of gait and mobility (R26.89);Muscle weakness (generalized) (M62.81);Pain Pain - Right/Left:  (back and BLE)   Activity Tolerance Patient tolerated treatment  well   Patient Left in bed;with call bell/phone within reach   Nurse Communication Mobility status        Time: 1633-1710 OT Time Calculation (min): 37 min  Charges: OT General Charges $OT Visit: 1 Visit OT Treatments $Self Care/Home Management : 23-37 mins    Delanna JINNY Lesches, OTR/L 09/23/2024, 5:43 PM

## 2024-09-23 NOTE — Progress Notes (Signed)
 Speech Language Pathology Treatment: Dysphagia  Patient Details Name: Mary Ferguson MRN: [id number] DOB: 08/07/1951 Today's Date: 09/23/2024 Time: 1210-1230 SLP Time Calculation (min) (ACUTE ONLY): 20 min  Assessment / Plan / Recommendation Clinical Impression  Pt reports tolerance of upgraded diet, though she reports not enjoying food. She reports she has trouble with her teeth, that food gets caught there sometimes. She also reports no sense of taste or smell since having Covid. She says she sometimes feels that her food or drink doesn't go down and points mid sternum. SLP reviewed esophageal strategies in writing and verbally and explained her complaints are common with esophageal dysphagia, but she would need further testing to know if that is the problem. Encouraged her to talk to GI if the problem bothers her or gets worse. Pt grateful for strategies and said she would try them. SLP education complete will sign off.    HPI HPI: Mary Ferguson is a 74 y.o. female who presented to the hospital on 09/15/2024 for sudden onset painless hematochezia and no worsening in her chronic abdominal discomfort. GI consulted and she was admitted with acute lower GI bleeding with associated acute blood loss anemia. Flexible sigmoidoscopy 09/16/24 with old blood/diverticulosis, no sign of active bleed. CTA completed afterwards which did not show sign of active GI bleed. Colonoscopy completed 1/23 and clips placed for recent bleeding of diverticular opening and NPO status recommended. GI f/u on 1/24 and ok'd patient for clear liquids. Unfortunately, code stroke called for this patient shortly after GI visit and patient kept NPO. MRI and CTH did not show acute intracranial abnormality. SLP ordered for swallow evaluation. PMH: GERD, diverticulitis, CKD, chronic pain, abdominal wall hernia, PTSD, HTN, HLD, heart attack.      SLP Plan  All goals met        Swallow Evaluation Recommendations   Recommendations: PO  diet PO Diet Recommendation: Regular;Thin liquids (Level 0) Liquid Administration via: Straw;Cup Medication Administration: Whole meds with liquid Supervision: Patient able to self-feed Oral care recommendations: Pt independent with oral care     Recommendations                                 All goals met     Mary Ferguson  09/23/2024, 2:07 PM

## 2024-09-23 NOTE — Plan of Care (Signed)

## 2024-09-23 NOTE — Progress Notes (Signed)
 PHARMACY - ANTICOAGULATION CONSULT NOTE  Pharmacy Consult for heparin  Indication: history of DVT  Allergies[1]  Patient Measurements: Height: 5' 2 (157.5 cm) Weight: 113.9 kg (251 lb) IBW/kg (Calculated) : 50.1 HEPARIN  DW (KG): 78  Vital Signs: Temp: 98.1 F (36.7 C) (01/29 0406) Temp Source: Oral (01/29 0406) BP: 108/80 (01/29 1250) Pulse Rate: 128 (01/29 1250)  Labs: Recent Labs    09/21/24 0317 09/22/24 1015 09/23/24 0535 09/23/24 1040  HGB 11.7* 11.1*  --  12.5  HCT 36.8 35.6*  --  38.8  PLT 212 240  --  296  CREATININE 0.98 1.08* 1.01*  --     Estimated Creatinine Clearance: 60.1 mL/min (A) (by C-G formula based on SCr of 1.01 mg/dL (H)).   Medical History: Past Medical History:  Diagnosis Date   Abdominal wall hernia 01/27/2014   Formatting of this note might be different from the original.  With incarceration. 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     Acute and chronic respiratory failure with hypoxia (HCC) 01/24/2018   Acute diverticulitis 07/20/2022   Acute kidney injury superimposed on chronic kidney disease 07/21/2022   Asthma    Blood in stool    Chicken pox    Chronic generalized abdominal pain    Chronic pain    CKD (chronic kidney disease), stage III (HCC)    Colon polyps    Compression fracture of L1 lumbar vertebra (HCC)    COPD (chronic obstructive pulmonary disease) (HCC)    COVID-19 2020   Depression    Diverticulitis    Very severe.  Has had 2 bowel resections and temporary colostomy in the past.   Fall at home, initial encounter 07/21/2022   Fecal incontinence    Frequent headaches    GERD (gastroesophageal reflux disease)    Heart attack (HCC)    Heart disease    High anion gap metabolic acidosis 07/21/2022   History of blood transfusion    Hyperlipemia    Hypertension    Incontinence of feces 12/11/2016   Pneumonia due to infectious organism 01/12/2018   Postmenopausal 03/21/2021   PTSD (post-traumatic stress disorder)    Recurrent  deep vein thrombosis (DVT) (HCC)    x6   Rhabdomyolysis 07/21/2022   Urinary incontinence      Assessment: 74 year old female presented with abdominal cramping, dizziness and rectal bleeding. She has a history of multiple DVT in the past on Xarelto  20 mg daily PTA. This was held on admission (last dose 1/20) given GI bleeding and acute blood loss anemia. She underwent GI evaluation and received multiple transfusions throughout hospitalization. Concern for persistent mild bleeding and passage of clots, Hgb has remained stable over the course of several days.  Pharmacy consulted to start heparin  infusion.  Goal of Therapy:  Heparin  level 0.3-0.7 units/ml Monitor platelets by anticoagulation protocol: Yes   Plan:  -Defer heparin  bolus given persistent mild bleeding -Start heparin  infusion at 1200 units/hr -Check heparin  level ~ 8 hours after start of infusion -Daily CBC while on heparin  infusion -Monitor for any new or worsening bleeding   [name], PharmD, BCPS Clinical Pharmacist 09/23/2024 1:25 PM       [1]  Allergies Allergen Reactions   Rofecoxib Anaphylaxis    VIOXX   Metformin  And Related Other (See Comments)    Ibs    Sulfonylureas Other (See Comments)    Dizziness   Benadryl Allergy [Diphenhydramine Hcl] Other (See Comments)    Depressed respirations   Cephalexin Rash  Gatifloxacin Other (See Comments)    Confusion Very low blood pressure [place]

## 2024-09-23 NOTE — Progress Notes (Signed)
 " [facility] Progress Note  Patient: Mary Ferguson     FMW:[id number]  DOA: 09/15/2024   PCP: Catherine Charlies LABOR, DO       Brief hospital course: 74 year old female with asthma/COPD and chronic respiratory failure on 3 L of oxygen at baseline, diabetes mellitus type 2, coronary artery disease with history of PCI in relation to an MI, hypertension, recurrent DVTs, CKD, obesity, GERD, depression and anxiety presents to the hospital for abdominal cramping, dizziness and rectal bleeding. 09/16/2024: Flex sig-poor colon prep, blood in the rectum and the sigmoid colon, descending colon, mid descending colon and distal descending colon, diverticulosis in the rectosigmoid colon, sigmoid colon and descending colon 09/17/2024: Colonoscopy-diverticulosis, evidence of recent bleeding from the diverticular opening-clips were placed 09/18/24: Strokelike symptoms with left facial droop left-sided weakness-workup negative for CVA-clear liquids started after SLP eval 09/19/2024:  bloody bowel movements including clotted blood with intermittent dizziness 09/21/2024: Advance to full liquids  Subjective:  She passed more blood and clots today with hard stool but only a small amount. Complains of vertigo when attempting to get out of the bed. Has dizziness on ambulation.   Assessment and Plan: Principal Problem:   Acute lower GI bleeding/acute blood loss anemia - s/p 6 U PRBC and 1 FFP transfusions - Persistent mild bleeding and passage of clots continues but not as severe - Bleeding scan on 1/28 was negative - Hemoglobin has been stable in 11-13 range since 1/25- today is 12.5  -GI recommending starting Heparin  today along with Anusol  suppository and regular diet  Active Problems:  Neurological abnormality - Left facial droop left-sided weakness - Code stroke called - MRI negative for acute abnormality, did show chronic small vessel disease - 2D echo revealed EF of 60 to 65%, grade 1 diastolic  dysfunction-no thrombus - PT/OT/speech eval-SNF recommended-continue regular diet - Lipid panel-HDL 30, LDL 36 - A1c-6.0  History of DVT/PE - multiple episodes and therefore on life long anticoagulation with Xarelto  which is on hold - will start Heparin  infusion per GI recommendations and follow  Glucose intolerance - Continue 3 times daily NovoLog  based on sliding scale  Dizziness - check orthostatics  Vertigo- ? BPPV - vestibular eval  ordered   Intertrigo - Continue wound care and nystatin  powder  Hypokalemia - Replace and follow  AKI-CKD stage IIIa, - following creatinine   Metabolic acidosis - metabolic acidosis resolved   Obesity class III Body mass index is 45.91 kg/m.  Thrombocytopenia - Platelets as low ast 140s- resolved          Code Status: Full Code Total time on patient care: 35 minutes DVT prophylaxis:  SCDs Start: 09/15/24 1131     Objective:   Vitals:   09/22/24 0602 09/22/24 1558 09/22/24 2045 09/23/24 0406  BP: 129/66 131/76 134/61 (!) 139/58  Pulse: 85 87 84 79  Resp: 16  14 14   Temp: 98.4 F (36.9 C) (!) 97.2 F (36.2 C) 97.9 F (36.6 C) 98.1 F (36.7 C)  TempSrc: Oral Oral Oral Oral  SpO2:  91% 95% 96%  Weight:      Height:       Filed Weights   09/15/24 0806 09/16/24 1223 09/17/24 1327  Weight: 116 kg 116 kg 113.9 kg   Exam: General exam: Appears comfortable  HEENT: oral mucosa moist Respiratory system: Clear to auscultation.  Cardiovascular system: S1 & S2 heard  Gastrointestinal system: Abdomen soft, non-tender, nondistended. Normal bowel sounds   Extremities: No cyanosis, clubbing or edema Psychiatry:  Mood & affect appropriate.      CBC: Recent Labs  Lab 09/19/24 0758 09/19/24 1614 09/20/24 0242 09/21/24 0317 09/22/24 1015 09/23/24 1040  WBC 5.8 7.5 6.0 6.2 6.6 6.4  NEUTROABS 3.6 4.8 3.5 3.2 4.1  --   HGB 11.6* 12.4 12.3 11.7* 11.1* 12.5  HCT 35.2* 37.1 38.4 36.8 35.6* 38.8  MCV 85.2 84.1 86.5 87.2  89.4 87.2  PLT 144* 140* 183 212 240 296   Basic Metabolic Panel: Recent Labs  Lab 09/18/24 0255 09/19/24 0758 09/20/24 0242 09/21/24 0317 09/22/24 1015 09/23/24 0535  NA 142 140 141 141 141 142  K 3.7 4.2 3.6 3.3* 4.4 4.3  CL 113* 109 107 107 109 109  CO2 23 20* 23 25 23 22   GLUCOSE 124* 90 124* 142* 159* 171*  BUN 11 8 9 8 10 11   CREATININE 0.88 0.91 0.91 0.98 1.08* 1.01*  CALCIUM  8.7* 9.3 10.2 10.5* 10.1 10.4*  MG 1.9 2.0 2.0 2.0 1.9  --   PHOS 3.1 3.0 3.2 4.1 2.9  --      Scheduled Meds:  sodium chloride    Intravenous Once   amitriptyline   75 mg Oral QHS   atorvastatin   80 mg Oral QPM   Chlorhexidine  Gluconate Cloth  6 each Topical QHS   cyanocobalamin   5,000 mcg Oral Daily   famotidine   40 mg Oral QHS   feeding supplement  1 Container Oral TID BM   hydrocortisone   25 mg Rectal BID   insulin  aspart  0-9 Units Subcutaneous TID WC   nystatin    Topical TID   nystatin  cream  1 Application Topical BID   sodium chloride  flush  10-40 mL Intracatheter Q12H    Imaging and lab data personally reviewed   Author: [employer]  09/23/2024 12:33 PM  To contact [facility]>   Check the care team in [facility] and look for the attending/consulting [facility] provider listed  Log into [email or web address] and use [facility] universal password   Go to> [facility]  and find provider  If you still have difficulty reaching the provider, please page the [facility] (Director on Call) for the Hospitalists listed on amion     "

## 2024-09-24 DIAGNOSIS — K922 Gastrointestinal hemorrhage, unspecified: Secondary | ICD-10-CM | POA: Diagnosis not present

## 2024-09-24 LAB — GLUCOSE, CAPILLARY
Glucose-Capillary: 111 mg/dL — ABNORMAL HIGH (ref 70–99)
Glucose-Capillary: 123 mg/dL — ABNORMAL HIGH (ref 70–99)
Glucose-Capillary: 100 mg/dL — ABNORMAL HIGH (ref 70–99)
Glucose-Capillary: 159 mg/dL — ABNORMAL HIGH (ref 70–99)
Glucose-Capillary: 159 mg/dL — ABNORMAL HIGH (ref 70–99)

## 2024-09-24 LAB — CBC
HCT: 35.2 % — ABNORMAL LOW (ref 36.0–46.0)
Hemoglobin: 11.8 g/dL — ABNORMAL LOW (ref 12.0–15.0)
MCH: 29.1 pg (ref 26.0–34.0)
MCHC: 33.5 g/dL (ref 30.0–36.0)
MCV: 86.7 fL (ref 80.0–100.0)
Platelets: 251 10*3/uL (ref 150–400)
RBC: 4.06 MIL/uL (ref 3.87–5.11)
RDW: 15.9 % — ABNORMAL HIGH (ref 11.5–15.5)
WBC: 5.8 10*3/uL (ref 4.0–10.5)
nRBC: 0 % (ref 0.0–0.2)

## 2024-09-24 LAB — HEPARIN LEVEL (UNFRACTIONATED)
Heparin Unfractionated: 0.54 [IU]/mL (ref 0.30–0.70)
Heparin Unfractionated: 0.81 [IU]/mL — ABNORMAL HIGH (ref 0.30–0.70)

## 2024-09-24 MED ORDER — RIVAROXABAN 20 MG PO TABS
20.0000 mg | ORAL_TABLET | Freq: Every day | ORAL | Status: DC
  Administered 2024-09-24 – 2024-09-26 (×3): 20 mg via ORAL
  Filled 2024-09-24 (×3): qty 1

## 2024-09-24 NOTE — Plan of Care (Signed)
" °  Problem: Education: Goal: Knowledge of General Education information will improve Description: Including pain rating scale, medication(s)/side effects and non-pharmacologic comfort measures Outcome: Progressing   Problem: Activity: Goal: Risk for activity intolerance will decrease Outcome: Progressing   Problem: Nutrition: Goal: Adequate nutrition will be maintained Outcome: Progressing   Problem: Coping: Goal: Level of anxiety will decrease Outcome: Progressing   Problem: Elimination: Goal: Will not experience complications related to bowel motility Outcome: Progressing   Problem: Pain Managment: Goal: General experience of comfort will improve and/or be controlled Outcome: Progressing   Problem: Safety: Goal: Ability to remain free from injury will improve Outcome: Progressing   Problem: Coping: Goal: Ability to adjust to condition or change in health will improve Outcome: Progressing   "

## 2024-09-24 NOTE — Progress Notes (Signed)
 " [facility] Progress Note  Patient: Mary Ferguson     FMW:[id number]  DOA: 09/15/2024   PCP: Catherine Charlies LABOR, DO       Brief hospital course: 74 year old female with asthma/COPD and chronic respiratory failure on 3 L of oxygen at baseline, diabetes mellitus type 2, coronary artery disease with history of PCI in relation to an MI, hypertension, recurrent DVTs, CKD, obesity, GERD, depression and anxiety presents to the hospital for abdominal cramping, dizziness and rectal bleeding. 09/16/2024: Flex sig-poor colon prep, blood in the rectum and the sigmoid colon, descending colon, mid descending colon and distal descending colon, diverticulosis in the rectosigmoid colon, sigmoid colon and descending colon 09/17/2024: Colonoscopy-diverticulosis, evidence of recent bleeding from the diverticular opening-clips were placed 09/18/24: Strokelike symptoms with left facial droop left-sided weakness-workup negative for CVA-clear liquids started after SLP eval 09/19/2024:  bloody bowel movements including clotted blood with intermittent dizziness 09/21/2024: Advance to full liquids  Subjective:  Very small amount of clots today  Assessment and Plan: Principal Problem:   Acute lower GI bleeding/acute blood loss anemia - s/p 6 U PRBC and 1 FFP transfusions - Persistent mild bleeding and passage of clots continues but not as severe - Bleeding scan on 1/28 was negative - Hemoglobin has been stable in 11-13 range since 1/25- today is 11.8  - cont Anusol  suppository and regular diet  Active Problems:  Neurological abnormality - Left facial droop left-sided weakness - Code stroke called - MRI negative for acute abnormality, did show chronic small vessel disease - 2D echo revealed EF of 60 to 65%, grade 1 diastolic dysfunction-no thrombus - PT/OT/speech eval-SNF recommended-continue regular diet - Lipid panel-HDL 30, LDL 36 - A1c-6.0  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 BPPV - vestibular eval  ordered   Intertrigo - Continue wound care and nystatin  powder  Hypokalemia - Replace and follow  AKI-CKD stage IIIa, - following creatinine   Metabolic acidosis - metabolic acidosis resolved   Obesity class III Body mass index is 45.91 kg/m.  Thrombocytopenia - Platelets as low ast 140s- resolved          Code Status: Full Code Total time on patient care: 35 minutes DVT prophylaxis:  SCDs Start: 09/15/24 1131 rivaroxaban  (XARELTO ) tablet 20 mg     Objective:   Vitals:   09/23/24 1414 09/23/24 1956 09/24/24 0449 09/24/24 1332  BP: (!) 119/91 125/72 122/66 123/83  Pulse: (!) 107 89 72 83  Resp: 20 18 18    Temp: 99 F (37.2 C) 98.4 F (36.9 C) 98 F (36.7 C) 98.6 F (37 C)  TempSrc: Oral Oral Oral Oral  SpO2:  94% 97% 98%  Weight:      Height:       Filed Weights   09/15/24 0806 09/16/24 1223 09/17/24 1327  Weight: 116 kg 116 kg 113.9 kg   Exam: General exam: Appears comfortable  HEENT: oral mucosa moist Respiratory system: Clear to auscultation.  Cardiovascular system: S1 & S2 heard  Gastrointestinal system: Abdomen soft, non-tender, nondistended. Normal bowel sounds   Extremities: No cyanosis, clubbing or edema Psychiatry:  Mood & affect appropriate.      CBC: Recent Labs  Lab 09/19/24 0758 09/19/24 1614 09/20/24 0242 09/21/24 0317 09/22/24 1015 09/23/24 1040 09/24/24 0840  WBC 5.8 7.5 6.0 6.2 6.6 6.4 5.8  NEUTROABS 3.6 4.8 3.5 3.2 4.1  --   --   HGB 11.6* 12.4 12.3 11.7* 11.1* 12.5 11.8*  HCT 35.2* 37.1 38.4 36.8 35.6* 38.8 35.2*  MCV 85.2 84.1 86.5 87.2 89.4 87.2 86.7  PLT 144* 140* 183 212 240 296 251   Basic Metabolic Panel: Recent Labs  Lab 09/18/24 0255 09/19/24 0758 09/20/24 0242 09/21/24 0317  09/22/24 1015 09/23/24 0535  NA 142 140 141 141 141 142  K 3.7 4.2 3.6 3.3* 4.4 4.3  CL 113* 109 107 107 109 109  CO2 23 20* 23 25 23 22   GLUCOSE 124* 90 124* 142* 159* 171*  BUN 11 8 9 8 10 11   CREATININE 0.88 0.91 0.91 0.98 1.08* 1.01*  CALCIUM  8.7* 9.3 10.2 10.5* 10.1 10.4*  MG 1.9 2.0 2.0 2.0 1.9  --   PHOS 3.1 3.0 3.2 4.1 2.9  --      Scheduled Meds:  sodium chloride    Intravenous Once   amitriptyline   75 mg Oral QHS   atorvastatin   80 mg Oral QPM   Chlorhexidine  Gluconate Cloth  6 each Topical QHS   cyanocobalamin   5,000 mcg Oral Daily   famotidine   40 mg Oral QHS   feeding supplement  1 Container Oral TID BM   hydrocortisone   25 mg Rectal BID   insulin  aspart  0-9 Units Subcutaneous TID WC   magnesium  oxide  400 mg Oral BID   nystatin    Topical TID   nystatin  cream  1 Application Topical BID   rivaroxaban   20 mg Oral Daily   sodium chloride  flush  10-40 mL Intracatheter Q12H    Imaging and lab data personally reviewed   Author: [employer]  09/24/2024 3:14 PM  To contact [facility]>   Check the care team in [facility] and look for the attending/consulting [facility] provider listed  Log into [email or web address] and use [facility]'s universal password   Go to> [facility]  and find provider  If you still have difficulty reaching the provider, please page the [facility] (Director on Call) for the Hospitalists listed on amion     "

## 2024-09-24 NOTE — Progress Notes (Signed)
 [facility] Gastroenterology Progress Note  Mary Ferguson 74 y.o. 1951/05/02  CC:  GI bleed   Subjective: Patient seen and examined at bedside.  Had 1 bowel movement this morning with small amount of blood mixed with the stool.  ROS : Afebrile, negative for chest pain   Objective: Vital signs in last 24 hours: Vitals:   09/24/24 0449 09/24/24 1332  BP: 122/66 123/83  Pulse: 72 83  Resp: 18   Temp: 98 F (36.7 C) 98.6 F (37 C)  SpO2: 97% 98%    Physical Exam: Sitting comfortably, not in acute distress, abdominal exam benign.  Mood and affect normal.  Alert and oriented x 3.  Lab Results: Recent Labs    09/22/24 1015 09/23/24 0535  NA 141 142  K 4.4 4.3  CL 109 109  CO2 23 22  GLUCOSE 159* 171*  BUN 10 11  CREATININE 1.08* 1.01*  CALCIUM  10.1 10.4*  MG 1.9  --   PHOS 2.9  --    Recent Labs    09/22/24 1015  AST 19  ALT 11  ALKPHOS 83  BILITOT 0.5  PROT 5.6*  ALBUMIN  3.3*   Recent Labs    09/22/24 1015 09/23/24 1040 09/24/24 0840  WBC 6.6 6.4 5.8  NEUTROABS 4.1  --   --   HGB 11.1* 12.5 11.8*  HCT 35.6* 38.8 35.2*  MCV 89.4 87.2 86.7  PLT 240 296 251   No results for input(s): LABPROT, INR in the last 72 hours.    Assessment/Plan: - Acute diverticular bleed status post colonoscopy on September 17, 2024 with finding concerning for active bleeding from ascending colon diverticulum status post Hemoclip placement.  CT angio GI bleeding scan negativeon January 22.  Bleeding scan negative January 28. - Acute blood loss anemia.  Hemoglobin was down to 7.8 few days ago.  Status post blood transfusion.  Hemoglobin stable. -History of DVT.  Xarelto  on hold. - History of coronary artery disease - History of COPD and other chronic conditions  Recommendations -------------------------- - She continues to have streaks of blood in the stool.  Hemoglobin is stable despite of being on heparin  drip.  Okay to switch to oral anticoagulation. - No further inpatient  GI workup planned. - GI will sign off.  Call us  back if needed.   [name] MD, FACP 09/24/2024, 2:47 PM  Contact #  [phone]

## 2024-09-24 NOTE — Progress Notes (Addendum)
 Physical Therapy Treatment Patient Details Name: Mary Ferguson MRN: [id number] DOB: 1951-01-11 Today's Date: 09/24/2024   History of Present Illness The is a 74 y.o. female who presented to the emergency department 09/15/24 with complaints of rectal bleeding associated with generalized abdominal cramping and postural dizziness.Flex Sig and she had old blood and diverticulosis but no active signs of bleeding.  She has received 6 units PRBC during admission.  Also, code stroke due to L facial droop but workup was negative for acute infarct. PMH: abdominal wall hernia, PTSD, depression, varicella-zoster, rhabdomyolysis, urinary incontinence, diverticulosis/diverticulitis, asthma/COPD, chronic respiratory failure on [place] oxygen at 3 LPM, type II DM, CAD, history of MI, status post PCI, hyperlipidemia, hypertension, recurrent DVTs on chronic anticoagulation, class III obesity, stage IIIa CKD, depression, GERD    PT Comments  Received consult for vestibular evaluation in addition to mobility.  Pt was + for posterior canal BPPV bil sides with L worse than R.  Tolerated bil Epley maneuver well and was very grateful to have a reason for dizziness that has been bothering her for years.  Current recommendation is for SNF at d/c - but if not able to treat vestibular at SNF would benefit from outpt vestibular PT after SNF.   In regards to mobility, pt motivated and making gradual improvements in stability and gait distances.  Does still remain a fall risk and is at home alone at times - continue to recommend SNF at d/c.     If plan is discharge home, recommend the following: A little help with walking and/or transfers;A little help with bathing/dressing/bathroom;Help with stairs or ramp for entrance;Assistance with cooking/housework;Assist for transportation   Can travel by private vehicle     Yes  Equipment Recommendations  None recommended by PT    Recommendations for Other Services       Precautions /  Restrictions Precautions Precautions: Fall     Mobility  Bed Mobility Overal bed mobility: Needs Assistance Bed Mobility: Sit to Sidelying, Sidelying to Sit   Sidelying to sit: Min assist     Sit to sidelying: Min assist General bed mobility comments: Multiple sidelying to and from sit for mobility and vestibular testing    Transfers Overall transfer level: Needs assistance Equipment used: Rolling walker (2 wheels) Transfers: Sit to/from Stand Sit to Stand: Min assist           General transfer comment: Cues for hand placment; needing light min A to steady and rise at times; Performed STS x 5 during session    Ambulation/Gait Ambulation/Gait assistance: Contact guard assist Gait Distance (Feet): 60 Feet Assistive device: Rolling walker (2 wheels) Gait Pattern/deviations: Step-through pattern Gait velocity: decr     General Gait Details: Pt prefers not to ambulate in hallway.  Made 2 laps around in room wtih multiple turns (cues for slow turns and focus points).  CGA for safety  HR did elevate to 141 bpm and pt needing rest break with quick recovery.  On RA with O2 sats 95%   Stairs             Wheelchair Mobility     Tilt Bed    Modified Rankin (Stroke Patients Only)       Balance Overall balance assessment: Needs assistance, History of Falls Sitting-balance support: Feet supported, No upper extremity supported Sitting balance-Leahy Scale: Good     Standing balance support: Bilateral upper extremity supported, No upper extremity supported Standing balance-Leahy Scale: Fair Standing balance comment: RW to ambulate, did static  stand to wash hands without UE support                            Communication    Cognition Arousal: Alert Behavior During Therapy: WFL for tasks assessed/performed   PT - Cognitive impairments: No apparent impairments                                Cueing    Exercises      General Comments    Vestibular:  History: Pt reports that she has always been someone who has difficulty with depth perception, motion sickness, or even looking over the edge of a porch for example.  Additionally, reports that for at least 2 years she gets a spinning sensation when lying down flat or if she reaches down to get something (most commonly when reaching into the fridge then coming back to standing).  These sensations only last a few seconds.  She is not aware of having BPPV and has not been treated in past.  She does have a hx of falls. Pt sleeps on L side.  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  Orthostatic BP today were negative and as follows Supine 129/68 with HR 90; Sitting 139/90 with HR 102; standing 140/92 with HR 103.   Oculor Motor Exam: Resting and gaze induced nystagmus: Negative Smooth pursuit: Intact Gaze Stabilization: Intact but only tolerating slowed movements Head Thrust: + bilaterally  Horizontal Roll Test: Negative Bil [employer]: + bilaterally with worse symptoms and longer lasting nystagmus (10 sec L, 5 sec R) on the L side.  Had tested R side first followed by Epley then tested L side followed by Epley.  Educated pt on BPPV and also likely chronic hypofunction, but that BPPV most likely cause of her spinning sensations with supine and when reaching in fridge.  Provided handout on BPPV and self Epley to L - advised focusing on L initially.  Also, discussed if unable to treat at SNF would recommend f/u with outpt vestibular PT and provided list of clinics.       Pertinent Vitals/Pain Pain Assessment Pain Assessment: 0-10 Pain Score: 3  Pain Location: chronic back and BLE Pain Descriptors / Indicators: Aching, Discomfort Pain Intervention(s): Limited activity within patient's tolerance, Monitored during session    Home Living                           Prior Function            PT Goals (current goals can now be found in the care plan section) Progress towards PT goals: Progressing toward goals    Frequency    Min 2X/week      PT Plan      Co-evaluation              AM-PAC PT 6 Clicks Mobility   Outcome Measure  Help needed turning from your back to your side while in a flat bed without using bedrails?: None Help needed moving from lying on your back to sitting on the side of a flat bed without using bedrails?: A Little Help needed moving to and from a bed to a chair (including  a wheelchair)?: A Little Help needed standing up from a chair using your arms (e.g., wheelchair or bedside chair)?: A Little Help needed to walk in hospital room?: A Little Help needed climbing 3-5 steps with a railing? : A Lot 6 Click Score: 18    End of Session Equipment Utilized During Treatment: Gait belt Activity Tolerance: Patient tolerated treatment well Patient left: in bed;with call bell/phone within reach;with bed alarm set Nurse Communication: Mobility status PT Visit Diagnosis: Unsteadiness on feet (R26.81);History of falling (Z91.81);Pain;Difficulty in walking, not elsewhere classified (R26.2);BPPV BPPV - Right/Left : Left;Right     Time: 8447-8342 PT Time Calculation (min) (ACUTE ONLY): 65 min  Charges:    $Gait Training: 8-22 mins $Therapeutic Activity: 8-22 mins $Canalith Rep Proc: 8-22 mins $Physical Performance Test: 8-22 mins PT General Charges $$ ACUTE PT VISIT: 1 Visit                     Benjiman, PT Acute Rehab Services [facility] [phone]    Benjiman VEAR Mulberry 09/24/2024, 5:17 PM

## 2024-09-24 NOTE — Progress Notes (Signed)
 PHARMACY - ANTICOAGULATION CONSULT NOTE  Pharmacy Consult for heparin  Indication: history of DVT  Allergies[1]  Patient Measurements: Height: 5' 2 (157.5 cm) Weight: 113.9 kg (251 lb) IBW/kg (Calculated) : 50.1 HEPARIN  DW (KG): 78  Vital Signs: Temp: 98.4 F (36.9 C) (01/29 1956) Temp Source: Oral (01/29 1956) BP: 125/72 (01/29 1956) Pulse Rate: 89 (01/29 1956)  Labs: Recent Labs    09/21/24 0317 09/22/24 1015 09/23/24 0535 09/23/24 1040 09/23/24 2339  HGB 11.7* 11.1*  --  12.5  --   HCT 36.8 35.6*  --  38.8  --   PLT 212 240  --  296  --   HEPARINUNFRC  --   --   --   --  0.54  CREATININE 0.98 1.08* 1.01*  --   --     Estimated Creatinine Clearance: 60.1 mL/min (A) (by C-G formula based on SCr of 1.01 mg/dL (H)).   Medical History: Past Medical History:  Diagnosis Date   Abdominal wall hernia 01/27/2014   Formatting of this note might be different from the original.  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     Acute and chronic respiratory failure with hypoxia (HCC) 01/24/2018   Acute diverticulitis 07/20/2022   Acute kidney injury superimposed on chronic kidney disease 07/21/2022   Asthma    Blood in stool    Chicken pox    Chronic generalized abdominal pain    Chronic pain    CKD (chronic kidney disease), stage III (HCC)    Colon polyps    Compression fracture of L1 lumbar vertebra (HCC)    COPD (chronic obstructive pulmonary disease) (HCC)    COVID-19 2020   Depression    Diverticulitis    Very severe.  Has had 2 bowel resections and temporary colostomy in the past.   Fall at home, initial encounter 07/21/2022   Fecal incontinence    Frequent headaches    GERD (gastroesophageal reflux disease)    Heart attack (HCC)    Heart disease    High anion gap metabolic acidosis 07/21/2022   History of blood transfusion    Hyperlipemia    Hypertension    Incontinence of feces 12/11/2016   Pneumonia due to infectious organism 01/12/2018    Postmenopausal 03/21/2021   PTSD (post-traumatic stress disorder)    Recurrent deep vein thrombosis (DVT) (HCC)    x6   Rhabdomyolysis 07/21/2022   Urinary incontinence      Assessment: 74 year old female presented with abdominal cramping, dizziness and rectal bleeding. She has a history of multiple DVT in the past on Xarelto  20 mg daily PTA. This was held on admission (last dose 1/20) given GI bleeding and acute blood loss anemia. She underwent GI evaluation and received multiple transfusions throughout hospitalization. Concern for persistent mild bleeding and passage of clots, Hgb has remained stable over the course of several days.  Pharmacy consulted to start heparin  infusion.  09/24/24 Heparin  level = 0.54 (therapeutic) with heparin  gtt @ 1200 units/hr RN reports she has not seen any bleeding complications but shared the patient reported earlier in the day noticing a little blood in a bowel movement  Goal of Therapy:  Heparin  level 0.3-0.7 units/ml Monitor platelets by anticoagulation protocol: Yes   Plan:  -Continue heparin  infusion at 1200 units/hr -Recheck heparin  level ~ 8 hours to confirm therapeutic dose -Daily CBC and heparin  level while on heparin  infusion -Monitor for any new or worsening bleeding   [name], PharmD Clinical Pharmacist 09/24/2024 12:33 AM        [  1]  Allergies Allergen Reactions   Rofecoxib Anaphylaxis    VIOXX   Metformin  And Related Other (See Comments)    Ibs    Sulfonylureas Other (See Comments)    Dizziness   Benadryl Allergy [Diphenhydramine Hcl] Other (See Comments)    Depressed respirations   Cephalexin Rash   Gatifloxacin Other (See Comments)    Confusion Very low blood pressure [place]

## 2024-09-24 NOTE — Plan of Care (Signed)

## 2024-09-24 NOTE — Progress Notes (Signed)
 PHARMACY - ANTICOAGULATION CONSULT NOTE  Pharmacy Consult for heparin  Indication: history of DVT  Allergies[1]  Patient Measurements: Height: 5' 2 (157.5 cm) Weight: 113.9 kg (251 lb) IBW/kg (Calculated) : 50.1 HEPARIN  DW (KG): 78  Vital Signs: Temp: 98 F (36.7 C) (01/30 0449) Temp Source: Oral (01/30 0449) BP: 122/66 (01/30 0449) Pulse Rate: 72 (01/30 0449)  Labs: Recent Labs    09/22/24 1015 09/23/24 0535 09/23/24 1040 09/23/24 2339 09/24/24 0840  HGB 11.1*  --  12.5  --  11.8*  HCT 35.6*  --  38.8  --  35.2*  PLT 240  --  296  --  251  HEPARINUNFRC  --   --   --  0.54 0.81*  CREATININE 1.08* 1.01*  --   --   --     Estimated Creatinine Clearance: 60.1 mL/min (A) (by C-G formula based on SCr of 1.01 mg/dL (H)).   Medical History: Past Medical History:  Diagnosis Date   Abdominal wall hernia 01/27/2014   Formatting of this note might be different from the original.  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     Acute and chronic respiratory failure with hypoxia (HCC) 01/24/2018   Acute diverticulitis 07/20/2022   Acute kidney injury superimposed on chronic kidney disease 07/21/2022   Asthma    Blood in stool    Chicken pox    Chronic generalized abdominal pain    Chronic pain    CKD (chronic kidney disease), stage III (HCC)    Colon polyps    Compression fracture of L1 lumbar vertebra (HCC)    COPD (chronic obstructive pulmonary disease) (HCC)    COVID-19 2020   Depression    Diverticulitis    Very severe.  Has had 2 bowel resections and temporary colostomy in the past.   Fall at home, initial encounter 07/21/2022   Fecal incontinence    Frequent headaches    GERD (gastroesophageal reflux disease)    Heart attack (HCC)    Heart disease    High anion gap metabolic acidosis 07/21/2022   History of blood transfusion    Hyperlipemia    Hypertension    Incontinence of feces 12/11/2016   Pneumonia due to infectious organism 01/12/2018    Postmenopausal 03/21/2021   PTSD (post-traumatic stress disorder)    Recurrent deep vein thrombosis (DVT) (HCC)    x6   Rhabdomyolysis 07/21/2022   Urinary incontinence      Assessment: 74 year old female presented with abdominal cramping, dizziness and rectal bleeding. She has a history of multiple DVT in the past on Xarelto  20 mg daily PTA. This was held on admission (last dose 1/20) given GI bleeding and acute blood loss anemia. She underwent GI evaluation and received multiple transfusions throughout hospitalization. Concern for persistent mild bleeding and passage of clots, Hgb has remained stable over the course of several days.  Pharmacy consulted to start heparin  infusion.  09/24/24 -Heparin  level 0.81 - supratherapeutic with heparin  infusing at 1200 units/hr -CBC stable -RN reports no bleeding events, noted patient has had some blood in stool ongoing for several days  Goal of Therapy:  Heparin  level 0.3-0.7 units/ml Monitor platelets by anticoagulation protocol: Yes   Plan:  -Decrease heparin  infusion to 1000 units/hr -Recheck heparin  level ~ 8 hours -Daily CBC and heparin  level while on heparin  infusion -Monitor for any new or worsening bleeding   Mary Ferguson, PharmD, BCPS Clinical Pharmacist 09/24/2024 10:42 AM          [  1]  Allergies Allergen Reactions   Rofecoxib Anaphylaxis    VIOXX   Metformin  And Related Other (See Comments)    Ibs    Sulfonylureas Other (See Comments)    Dizziness   Benadryl Allergy [Diphenhydramine Hcl] Other (See Comments)    Depressed respirations   Cephalexin Rash   Gatifloxacin Other (See Comments)    Confusion Very low blood pressure [place]inn

## 2024-09-25 DIAGNOSIS — K922 Gastrointestinal hemorrhage, unspecified: Secondary | ICD-10-CM | POA: Diagnosis not present

## 2024-09-25 LAB — RESPIRATORY PANEL BY PCR

## 2024-09-25 LAB — CBC
HCT: 35 % — ABNORMAL LOW (ref 36.0–46.0)
Hemoglobin: 11.5 g/dL — ABNORMAL LOW (ref 12.0–15.0)
MCH: 29 pg (ref 26.0–34.0)
MCHC: 32.9 g/dL (ref 30.0–36.0)
MCV: 88.4 fL (ref 80.0–100.0)
Platelets: 276 10*3/uL (ref 150–400)
RBC: 3.96 MIL/uL (ref 3.87–5.11)
RDW: 15.9 % — ABNORMAL HIGH (ref 11.5–15.5)
WBC: 5.5 10*3/uL (ref 4.0–10.5)
nRBC: 0 % (ref 0.0–0.2)

## 2024-09-25 LAB — GLUCOSE, CAPILLARY
Glucose-Capillary: 124 mg/dL — ABNORMAL HIGH (ref 70–99)
Glucose-Capillary: 158 mg/dL — ABNORMAL HIGH (ref 70–99)
Glucose-Capillary: 97 mg/dL (ref 70–99)
Glucose-Capillary: 116 mg/dL — ABNORMAL HIGH (ref 70–99)

## 2024-09-25 LAB — RESP PANEL BY RT-PCR (RSV, FLU A&B, COVID)  RVPGX2
Influenza A by PCR: NEGATIVE
Influenza B by PCR: NEGATIVE
Resp Syncytial Virus by PCR: NEGATIVE
SARS Coronavirus 2 by RT PCR: NEGATIVE

## 2024-09-25 MED ORDER — SENNA 8.6 MG PO TABS
2.0000 | ORAL_TABLET | Freq: Every day | ORAL | Status: DC
  Administered 2024-09-25 – 2024-09-27 (×3): 17.2 mg via ORAL
  Filled 2024-09-25 (×3): qty 2

## 2024-09-25 MED ORDER — HYDROCERIN EX CREA
1.0000 | TOPICAL_CREAM | Freq: Two times a day (BID) | CUTANEOUS | Status: DC
  Administered 2024-09-25 – 2024-09-27 (×5): 1 via TOPICAL
  Filled 2024-09-25: qty 113

## 2024-09-25 NOTE — Plan of Care (Signed)
   Problem: Education: Goal: Knowledge of General Education information will improve Description Including pain rating scale, medication(s)/side effects and non-pharmacologic comfort measures Outcome: Progressing   Problem: Clinical Measurements: Goal: Ability to maintain clinical measurements within normal limits will improve Outcome: Progressing   Problem: Clinical Measurements: Goal: Will remain free from infection Outcome: Progressing

## 2024-09-25 NOTE — Plan of Care (Signed)

## 2024-09-25 NOTE — Progress Notes (Signed)
 " [facility] Progress Note  Patient: Mary Ferguson     FMW:[id number]  DOA: 09/15/2024   PCP: Catherine Charlies LABOR, DO       Brief hospital course: 74 year old female with asthma/COPD and chronic respiratory failure on 3 L of oxygen at baseline, diabetes mellitus type 2, coronary artery disease with history of PCI in relation to an MI, hypertension, recurrent DVTs, CKD, obesity, GERD, depression and anxiety presents to the hospital for abdominal cramping, dizziness and rectal bleeding. 09/16/2024: Flex sig-poor colon prep, blood in the rectum and the sigmoid colon, descending colon, mid descending colon and distal descending colon, diverticulosis in the rectosigmoid colon, sigmoid colon and descending colon 09/17/2024: Colonoscopy-diverticulosis, evidence of recent bleeding from the diverticular opening-clips were placed 09/18/24: Strokelike symptoms with left facial droop left-sided weakness-workup negative for CVA-clear liquids started after SLP eval 09/19/2024:  bloody bowel movements including clotted blood with intermittent dizziness 09/21/2024: Advance to full liquids 09/23/2024: Advanced to solids, Heparin  drip started along with Anusol  1/302026:  Xarelto  resumed  Subjective:  Now only having blood when she wipes.   Assessment and Plan: Principal Problem:   Acute lower GI bleeding/acute blood loss anemia - s/p 6 U PRBC and 1 FFP transfusions - Persistent mild bleeding and passage of clots continued  - Bleeding scan on 1/28 was negative - Hemoglobin has been stable in 11-13 range since 1/25- t   - cont Anusol  suppository and regular diet- bleeding has nearly resolved  Active Problems:  Neurological abnormality - Left facial droop left-sided weakness - Code stroke called - MRI negative for acute abnormality, did show chronic small vessel disease - 2D echo revealed EF of 60 to 65%, grade 1 diastolic dysfunction-no thrombus - PT/OT/speech eval-SNF recommended-continue regular  diet - Lipid panel-HDL 30, LDL 36 - A1c-6.0  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 BPPV - vestibular eval  completed- she has BPPV and will need continued vestibular therapy at SNF  Intertrigo - Continue wound care and nystatin  powder  Hypokalemia - Replaced  AKI-CKD stage IIIa - Cr as high as 1.60 - improved    Metabolic acidosis - metabolic acidosis resolved   Obesity class III Body mass index is 45.91 kg/m.  Thrombocytopenia - Platelets as low ast 140s- resolved     Code Status: Full Code Total time on patient care: 35 minutes DVT prophylaxis:  SCDs Start: 09/15/24 1131 rivaroxaban  (XARELTO ) tablet 20 mg     Objective:   Vitals:   09/24/24 0449 09/24/24 1332 09/24/24 2026 09/25/24 0431  BP: 122/66 123/83 127/69 (!) 118/57  Pulse: 72 83 85 71  Resp: 18  18 18   Temp: 98 F (36.7 C) 98.6 F (37 C) 98.4 F (36.9 C) 97.9 F (36.6 C)  TempSrc: Oral Oral Oral   SpO2: 97% 98% 94% 97%  Weight:      Height:       Filed Weights   09/15/24 0806 09/16/24 1223 09/17/24 1327  Weight: 116 kg 116 kg 113.9 kg   Exam: General exam: Appears comfortable  HEENT: oral mucosa moist Respiratory system: Clear to auscultation.  Cardiovascular system: S1 & S2 heard  Gastrointestinal system: Abdomen soft, non-tender, nondistended. Normal bowel sounds  Extremities: No cyanosis, clubbing or edema Psychiatry:  Mood & affect appropriate.      CBC: Recent Labs  Lab 09/19/24 0758 09/19/24 1614 09/20/24 0242 09/21/24 0317 09/22/24 1015 09/23/24 1040 09/24/24 0840 09/25/24 0707  WBC 5.8 7.5 6.0 6.2 6.6 6.4 5.8 5.5  NEUTROABS 3.6 4.8 3.5 3.2 4.1  --   --   --   HGB 11.6* 12.4 12.3 11.7* 11.1*  12.5 11.8* 11.5*  HCT 35.2* 37.1 38.4 36.8 35.6* 38.8 35.2* 35.0*  MCV 85.2 84.1 86.5 87.2 89.4 87.2 86.7 88.4  PLT 144* 140* 183 212 240 296 251 276   Basic Metabolic Panel: Recent Labs  Lab 09/19/24 0758 09/20/24 0242 09/21/24 0317 09/22/24 1015 09/23/24 0535  NA 140 141 141 141 142  K 4.2 3.6 3.3* 4.4 4.3  CL 109 107 107 109 109  CO2 20* 23 25 23 22   GLUCOSE 90 124* 142* 159* 171*  BUN 8 9 8 10 11   CREATININE 0.91 0.91 0.98 1.08* 1.01*  CALCIUM  9.3 10.2 10.5* 10.1 10.4*  MG 2.0 2.0 2.0 1.9  --   PHOS 3.0 3.2 4.1 2.9  --      Scheduled Meds:  sodium chloride    Intravenous Once   amitriptyline   75 mg Oral QHS   atorvastatin   80 mg Oral QPM   Chlorhexidine  Gluconate Cloth  6 each Topical QHS   cyanocobalamin   5,000 mcg Oral Daily   famotidine   40 mg Oral QHS   feeding supplement  1 Container Oral TID BM   hydrocerin  1 Application Topical BID   hydrocortisone   25 mg Rectal BID   insulin  aspart  0-9 Units Subcutaneous TID WC   magnesium  oxide  400 mg Oral BID   nystatin    Topical TID   nystatin  cream  1 Application Topical BID   rivaroxaban   20 mg Oral Q supper   senna  2 tablet Oral Daily   sodium chloride  flush  10-40 mL Intracatheter Q12H    Imaging and lab data personally reviewed   Author: Ellionna Buckbee  09/25/2024 11:19 AM  To contact [facility]>   Check the care team in [facility] and look for the attending/consulting [facility] provider listed  Log into [email or web address] and use [place]'s universal password   Go to> [facility]  and find provider  If you still have difficulty reaching the provider, please page the [facility] (Director on Call) for the Hospitalists listed on amion     "

## 2024-09-26 DIAGNOSIS — K922 Gastrointestinal hemorrhage, unspecified: Secondary | ICD-10-CM | POA: Diagnosis not present

## 2024-09-26 LAB — CBC
HCT: 36.2 % (ref 36.0–46.0)
Hemoglobin: 11.7 g/dL — ABNORMAL LOW (ref 12.0–15.0)
MCH: 28.4 pg (ref 26.0–34.0)
MCHC: 32.3 g/dL (ref 30.0–36.0)
MCV: 87.9 fL (ref 80.0–100.0)
Platelets: 270 10*3/uL (ref 150–400)
RBC: 4.12 MIL/uL (ref 3.87–5.11)
RDW: 15.9 % — ABNORMAL HIGH (ref 11.5–15.5)
WBC: 6.6 10*3/uL (ref 4.0–10.5)
nRBC: 0 % (ref 0.0–0.2)

## 2024-09-26 LAB — GLUCOSE, CAPILLARY
Glucose-Capillary: 128 mg/dL — ABNORMAL HIGH (ref 70–99)
Glucose-Capillary: 103 mg/dL — ABNORMAL HIGH (ref 70–99)
Glucose-Capillary: 102 mg/dL — ABNORMAL HIGH (ref 70–99)
Glucose-Capillary: 119 mg/dL — ABNORMAL HIGH (ref 70–99)

## 2024-09-26 MED ORDER — ORAL CARE MOUTH RINSE: 15.0000 mL | OROMUCOSAL | Status: DC | PRN

## 2024-09-26 NOTE — Plan of Care (Signed)
   Problem: Education: Goal: Knowledge of General Education information will improve Description Including pain rating scale, medication(s)/side effects and non-pharmacologic comfort measures Outcome: Progressing

## 2024-09-26 NOTE — Progress Notes (Signed)
 " [facility] Progress Note  Patient: Mary Ferguson     FMW:[id number]  DOA: 09/15/2024   PCP: Catherine Charlies LABOR, DO       Brief hospital course: 74 year old female with asthma/COPD and chronic respiratory failure on 3 L of oxygen at baseline, diabetes mellitus type 2, coronary artery disease with history of PCI in relation to an MI, hypertension, recurrent DVTs, CKD, obesity, GERD, depression and anxiety presents to the hospital for abdominal cramping, dizziness and rectal bleeding. 09/16/2024: Flex sig-poor colon prep, blood in the rectum and the sigmoid colon, descending colon, mid descending colon and distal descending colon, diverticulosis in the rectosigmoid colon, sigmoid colon and descending colon 09/17/2024: Colonoscopy-diverticulosis, evidence of recent bleeding from the diverticular opening-clips were placed 09/18/24: Strokelike symptoms with left facial droop left-sided weakness-workup negative for CVA-clear liquids started after SLP eval 09/19/2024:  bloody bowel movements including clotted blood with intermittent dizziness 09/21/2024: Advance to full liquids 09/23/2024: Advanced to solids, Heparin  drip started along with Anusol  1/302026:  Xarelto  resumed  Subjective:  No new complaints. Still seeing a small amount of blood with wiping.   Assessment and Plan: Principal Problem:   Acute lower GI bleeding/acute blood loss anemia - s/p 6 U PRBC and 1 FFP transfusions - Persistent mild bleeding and passage of clots continued  - Bleeding scan on 1/28 was negative - Hemoglobin has been stable in 11-13 range since 1/25- t   - cont Anusol  suppository and regular diet- bleeding has nearly resolved  Active Problems:  Neurological abnormality - Left facial droop left-sided weakness - Code stroke called - MRI negative for acute abnormality, did show chronic small vessel disease - 2D echo revealed EF of 60 to 65%, grade 1 diastolic dysfunction-no thrombus - PT/OT/speech eval-SNF  recommended-continue regular diet - Lipid panel-HDL 30, LDL 36 - A1c-6.0  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  Thrombocytopenia - Platelets as low ast 140s- resolved  Disposition: medically stable and has been waiting for SNF     Code Status: Full Code Total time on patient care: 35 minutes DVT prophylaxis:  SCDs Start: 09/15/24 1131 rivaroxaban  (XARELTO ) tablet 20 mg     Objective:   Vitals:   09/25/24 0431 09/25/24 1750 09/25/24 1939 09/26/24 0434  BP: (!) 118/57 (!) 143/76 134/74 (!) 119/57  Pulse: 71 76 79 78  Resp: 18 18 18 16   Temp: 97.9 F (36.6 C) 98.5 F (36.9 C) 97.9 F (36.6 C) 99 F (37.2 C)  TempSrc:  Oral Oral   SpO2: 97%   95%  Weight:      Height:       Filed Weights   09/15/24 0806 09/16/24 1223 09/17/24 1327  Weight: 116 kg 116 kg 113.9 kg   Exam: General exam: Appears comfortable  HEENT: oral mucosa moist Respiratory system: Clear to auscultation.  Cardiovascular system: S1 & S2 heard  Gastrointestinal system: Abdomen soft, non-tender, nondistended. Normal bowel sounds   Extremities: No cyanosis, clubbing or edema Psychiatry:  Mood & affect appropriate.      CBC: Recent Labs  Lab 09/19/24 1614 09/20/24 0242 09/21/24 0317 09/22/24 1015 09/23/24 1040 09/24/24 0840 09/25/24 0707 09/26/24 0724  WBC 7.5 6.0 6.2 6.6 6.4 5.8  5.5 6.6  NEUTROABS 4.8 3.5 3.2 4.1  --   --   --   --   HGB 12.4 12.3 11.7* 11.1* 12.5 11.8* 11.5* 11.7*  HCT 37.1 38.4 36.8 35.6* 38.8 35.2* 35.0* 36.2  MCV 84.1 86.5 87.2 89.4 87.2 86.7 88.4 87.9  PLT 140* 183 212 240 296 251 276 270   Basic Metabolic Panel: Recent Labs  Lab 09/20/24 0242 09/21/24 0317 09/22/24 1015 09/23/24 0535  NA 141 141 141 142  K 3.6 3.3* 4.4 4.3  CL 107 107 109 109  CO2 23 25 23 22   GLUCOSE 124* 142* 159* 171*  BUN 9 8 10 11   CREATININE 0.91 0.98 1.08* 1.01*  CALCIUM  10.2 10.5* 10.1 10.4*  MG 2.0 2.0 1.9  --   PHOS 3.2 4.1 2.9  --      Scheduled Meds:  sodium chloride    Intravenous Once   amitriptyline   75 mg Oral QHS   atorvastatin   80 mg Oral QPM   Chlorhexidine  Gluconate Cloth  6 each Topical QHS   cyanocobalamin   5,000 mcg Oral Daily   famotidine   40 mg Oral QHS   feeding supplement  1 Container Oral TID BM   hydrocerin  1 Application Topical BID   hydrocortisone   25 mg Rectal BID   insulin  aspart  0-9 Units Subcutaneous TID WC   magnesium  oxide  400 mg Oral BID   nystatin    Topical TID   nystatin  cream  1 Application Topical BID   rivaroxaban   20 mg Oral Q supper   senna  2 tablet Oral Daily   sodium chloride  flush  10-40 mL Intracatheter Q12H    Imaging and lab data personally reviewed   Author: Timeka Goette  09/26/2024 11:53 AM  To contact [facility]>   Check the care team in [facility] and look for the attending/consulting [facility] provider listed  Log into [email or web address] and use [place]'s universal password   Go to> [facility]  and find provider  If you still have difficulty reaching the provider, please page the [facility] (Director on Call) for the Hospitalists listed on amion     "

## 2024-09-27 ENCOUNTER — Other Ambulatory Visit (HOSPITAL_COMMUNITY): Payer: Self-pay

## 2024-09-27 DIAGNOSIS — K648 Other hemorrhoids: Secondary | ICD-10-CM

## 2024-09-27 DIAGNOSIS — K5791 Diverticulosis of intestine, part unspecified, without perforation or abscess with bleeding: Secondary | ICD-10-CM

## 2024-09-27 DIAGNOSIS — K922 Gastrointestinal hemorrhage, unspecified: Secondary | ICD-10-CM | POA: Diagnosis not present

## 2024-09-27 LAB — CBC
HCT: 36.8 % (ref 36.0–46.0)
Hemoglobin: 11.8 g/dL — ABNORMAL LOW (ref 12.0–15.0)
MCH: 27.8 pg (ref 26.0–34.0)
MCHC: 32.1 g/dL (ref 30.0–36.0)
MCV: 86.8 fL (ref 80.0–100.0)
Platelets: 288 10*3/uL (ref 150–400)
RBC: 4.24 MIL/uL (ref 3.87–5.11)
RDW: 16 % — ABNORMAL HIGH (ref 11.5–15.5)
WBC: 6.4 10*3/uL (ref 4.0–10.5)
nRBC: 0 % (ref 0.0–0.2)

## 2024-09-27 LAB — GLUCOSE, CAPILLARY
Glucose-Capillary: 131 mg/dL — ABNORMAL HIGH (ref 70–99)
Glucose-Capillary: 131 mg/dL — ABNORMAL HIGH (ref 70–99)

## 2024-09-27 MED ORDER — HYDROCORTISONE ACETATE 25 MG RE SUPP
25.0000 mg | Freq: Two times a day (BID) | RECTAL | 0 refills | Status: AC
  Filled 2024-09-27: qty 12, 6d supply, fill #0

## 2024-09-27 MED ORDER — NYSTATIN 100000 UNIT/GM EX POWD
Freq: Three times a day (TID) | CUTANEOUS | 0 refills | Status: AC
  Filled 2024-09-27: qty 15, 15d supply, fill #0

## 2024-09-27 MED ORDER — HYDROCERIN EX CREA
1.0000 | TOPICAL_CREAM | Freq: Two times a day (BID) | CUTANEOUS | 0 refills | Status: AC
  Filled 2024-09-27: qty 120, 15d supply, fill #0

## 2024-09-27 MED ORDER — SENNA 8.6 MG PO TABS
2.0000 | ORAL_TABLET | Freq: Every day | ORAL | 0 refills | Status: AC
  Filled 2024-09-27: qty 120, 60d supply, fill #0

## 2024-09-27 MED ORDER — ISOSORBIDE MONONITRATE ER 30 MG PO TB24
ORAL_TABLET | ORAL | 0 refills | Status: AC
  Filled 2024-09-27: qty 30, 30d supply, fill #0

## 2024-09-27 NOTE — Progress Notes (Signed)
 Discharge meds in a secure bag delivered to patient by this RN

## 2024-09-27 NOTE — Discharge Instructions (Signed)
 Please start a high fiber diet to prevent further episodes of rectal bleeding.    You were cared for by a hospitalist during your hospital stay. Please review all of you discharge paperwork on the day of discharge and be sure you have all of your prescribed medications and please read the below instructions:  Once you are discharged, your primary care physician will handle any further medical issues. Please note that NO REFILLS for any discharge medications will be authorized once you are discharged as it is imperative that you return to your primary care physician (or establish a relationship with a primary care physician if you do not have one) for your aftercare needs. Please obtain a follow up appointment with your primary care physician within 1-2 weeks of discharge. Please take all your medications with you for your next visit with your Primary MD. Please request your Primary MD to go over all Hospital Tests and Procedures, Radiological results at the follow up appointment. In some cases, there will be blood work, cultures and biopsy results pending at the time of your discharge. Please request that your primary care M.D. goes through all the records of your hospital data and follows up on these results. Please get all hospital records sent to your primary MD by signing hospital release before you go home or request your primary care doctor's office to assist with obtaining medical records.   You must read complete instructions/literature along with all the possible adverse reactions/side effects for all the medicines that have been prescribed to you. Take any new medicines after you have completely understood and accpet all the possible adverse reactions/side effects.  Please take medications as prescribed and speak with your doctor if changes are needed.   If you have smoked or chewed tobacco in the last 2 yrs please stop. Stop any regular alcohol  and or any recreational drug use. Wear  Seat belts while driving.   If you had Pneumonia at the Hospital: Please get a 2 view Chest X ray done in 6-8 weeks after hospital discharge or sooner if instructed by your Primary MD.   If you have Congestive Heart Failure: Follow a cardiac low salt diet and 1.5 lit/day fluid restriction. Please call your Cardiologist or Primary MD anytime you have any of the following symptoms:  1) 3 pound weight gain in 24 hours or 5 pounds in 1 week  2) shortness of breath, with or without a dry hacking cough  3) increasing swelling in the feet or stomach  4) if you have to sleep on extra pillows at night in order to breathe   If you have Diabetes: Check blood sugars 4 times/day- once on AM empty stomach and then before each meal. Log in all results and show them to your primary doctor at your next visit. If glucose readings are often under 60 or above 400 call your primary MD to see if medication dosages need to be adjusted   If you have Syncope (passing out) or Seizure/Convulsions/Epilepsy: Please do not drive, operate heavy machinery, participate in activities at heights or participate in high speed sports until you have seen by Primary MD or a Neurologist and advised to do so again. Per [place]  DMV statutes, patients with seizures are not allowed to drive until they have been seizure-free for six months.  Use caution when using heavy equipment or power tools. Avoid working on ladders or at heights. Take showers instead of baths. Ensure the water temperature is not  too high on the home water heater. Do not go swimming alone. Do not lock yourself in a room alone (i.e. bathroom). When caring for infants or small children, sit down when holding, feeding, or changing them to minimize risk of injury to the child in the event you have a seizure. Maintain good sleep hygiene. Avoid alcohol.    If you had Gastrointestinal Bleeding: Please ask your Primary MD to check a complete blood count within one  week of discharge or at your next visit. Your endoscopic/colonoscopic biopsies that are pending at the time of discharge will also need to followed by your Primary MD.    [place] can reach the hospitalist office at phone [phone] or fax [phone]   If you do not have a primary care physician, you can call [phone] for a physician referral.

## 2024-09-27 NOTE — Plan of Care (Signed)

## 2024-09-29 ENCOUNTER — Telehealth: Payer: Self-pay | Admitting: *Deleted

## 2024-09-29 NOTE — Transitions of Care (Post Inpatient/ED Visit) (Signed)
" ° °  09/29/2024  Name: Mary Ferguson MRN: [id number] DOB: 1951-03-06  Today's TOC FU Call Status: Today's TOC FU Call Status:: Unsuccessful Call (1st Attempt) Unsuccessful Call (1st Attempt) Date: 09/29/24  Attempted to reach the patient regarding the most recent Inpatient/ED visit.  Follow Up Plan: Additional outreach attempts will be made to reach the patient to complete the Transitions of Care (Post Inpatient/ED visit) call.   Cathlean Headland BSN RN [facility] Health Care Management Coordinator [email or web address] Direct Dial: [phone]  Fax: [phone] Website: [email or web address]  "

## 2024-09-30 ENCOUNTER — Telehealth: Payer: Self-pay

## 2024-09-30 NOTE — Transitions of Care (Post Inpatient/ED Visit) (Signed)
" ° °  09/30/2024  Name: Mary Ferguson MRN: [id number] DOB: July 02, 1951  Today's TOC FU Call Status: Today's TOC FU Call Status:: Unsuccessful Call (2nd Attempt) Unsuccessful Call (2nd Attempt) Date: 09/30/24  Attempted to reach the patient regarding the most recent Inpatient/ED visit.  Follow Up Plan: Additional outreach attempts will be made to reach the patient to complete the Transitions of Care (Post Inpatient/ED visit) call.   Shona Prow RN, CCM [place]  VBCI-Population Health RN Care Manager [phone]  "

## 2024-11-01 ENCOUNTER — Ambulatory Visit: Admitting: Family Medicine

## 2024-12-02 ENCOUNTER — Ambulatory Visit: Admitting: Podiatry
